# Patient Record
Sex: Female | Born: 1945 | Race: Black or African American | Hispanic: No | Smoking: Current some day smoker
Health system: Southern US, Community
[De-identification: ages and names within clinical notes are randomized; demographics above are authoritative.]

## PROBLEM LIST (undated history)

## (undated) DIAGNOSIS — E079 Disorder of thyroid, unspecified: Secondary | ICD-10-CM

## (undated) DIAGNOSIS — R519 Headache, unspecified: Secondary | ICD-10-CM

## (undated) DIAGNOSIS — N200 Calculus of kidney: Secondary | ICD-10-CM

## (undated) DIAGNOSIS — K635 Polyp of colon: Secondary | ICD-10-CM

## (undated) DIAGNOSIS — F32A Depression, unspecified: Secondary | ICD-10-CM

## (undated) DIAGNOSIS — E785 Hyperlipidemia, unspecified: Secondary | ICD-10-CM

## (undated) DIAGNOSIS — F329 Major depressive disorder, single episode, unspecified: Secondary | ICD-10-CM

## (undated) DIAGNOSIS — K589 Irritable bowel syndrome without diarrhea: Secondary | ICD-10-CM

## (undated) DIAGNOSIS — C801 Malignant (primary) neoplasm, unspecified: Secondary | ICD-10-CM

## (undated) DIAGNOSIS — D649 Anemia, unspecified: Secondary | ICD-10-CM

## (undated) DIAGNOSIS — J189 Pneumonia, unspecified organism: Secondary | ICD-10-CM

## (undated) DIAGNOSIS — I10 Essential (primary) hypertension: Secondary | ICD-10-CM

## (undated) DIAGNOSIS — Z87442 Personal history of urinary calculi: Secondary | ICD-10-CM

## (undated) DIAGNOSIS — M1990 Unspecified osteoarthritis, unspecified site: Secondary | ICD-10-CM

## (undated) DIAGNOSIS — E039 Hypothyroidism, unspecified: Secondary | ICD-10-CM

## (undated) HISTORY — DX: Calculus of kidney: N20.0

## (undated) HISTORY — PX: FOOT SURGERY: SHX648

## (undated) HISTORY — DX: Anemia, unspecified: D64.9

## (undated) HISTORY — PX: BREAST SURGERY: SHX581

## (undated) HISTORY — DX: Polyp of colon: K63.5

## (undated) HISTORY — DX: Hyperlipidemia, unspecified: E78.5

## (undated) HISTORY — DX: Disorder of thyroid, unspecified: E07.9

## (undated) HISTORY — DX: Depression, unspecified: F32.A

## (undated) HISTORY — PX: KIDNEY STONE SURGERY: SHX686

## (undated) HISTORY — PX: THYROID SURGERY: SHX805

## (undated) HISTORY — PX: ABDOMINAL HYSTERECTOMY: SHX81

## (undated) HISTORY — PX: PORT-A-CATH REMOVAL: SHX5289

## (undated) HISTORY — DX: Major depressive disorder, single episode, unspecified: F32.9

## (undated) HISTORY — PX: TONSILLECTOMY: SUR1361

## (undated) HISTORY — PX: BREAST ENHANCEMENT SURGERY: SHX7

## (undated) HISTORY — DX: Irritable bowel syndrome, unspecified: K58.9

## (undated) HISTORY — PX: EYE SURGERY: SHX253

## (undated) HISTORY — PX: NASAL SINUS SURGERY: SHX719

---

## 1998-03-24 HISTORY — PX: CHOLECYSTECTOMY: SHX55

## 2003-08-03 ENCOUNTER — Other Ambulatory Visit: Payer: Self-pay

## 2004-03-29 ENCOUNTER — Emergency Department: Payer: Self-pay | Admitting: Emergency Medicine

## 2004-10-16 ENCOUNTER — Emergency Department: Payer: Self-pay | Admitting: Emergency Medicine

## 2004-10-16 ENCOUNTER — Other Ambulatory Visit: Payer: Self-pay

## 2006-12-25 ENCOUNTER — Emergency Department: Payer: Self-pay

## 2006-12-25 ENCOUNTER — Other Ambulatory Visit: Payer: Self-pay

## 2007-07-13 ENCOUNTER — Other Ambulatory Visit: Payer: Self-pay

## 2007-07-13 ENCOUNTER — Emergency Department: Payer: Self-pay | Admitting: Emergency Medicine

## 2011-04-04 ENCOUNTER — Emergency Department: Payer: Self-pay | Admitting: Emergency Medicine

## 2011-11-28 ENCOUNTER — Ambulatory Visit: Payer: Self-pay | Admitting: Otolaryngology

## 2011-12-04 ENCOUNTER — Ambulatory Visit: Payer: Self-pay | Admitting: Otolaryngology

## 2011-12-04 LAB — CREATININE, SERUM
Creatinine: 0.9 mg/dL (ref 0.60–1.30)
EGFR (African American): >60
EGFR (Non-African Amer.): >60

## 2011-12-17 ENCOUNTER — Ambulatory Visit: Payer: Self-pay | Admitting: Gastroenterology

## 2011-12-19 LAB — PATHOLOGY REPORT

## 2012-01-27 ENCOUNTER — Ambulatory Visit: Payer: Self-pay | Admitting: Otolaryngology

## 2012-02-09 ENCOUNTER — Ambulatory Visit: Payer: Self-pay | Admitting: Otolaryngology

## 2012-02-09 LAB — ALBUMIN: Albumin: 3.7 g/dL (ref 3.4–5.0)

## 2012-02-09 LAB — CALCIUM
Calcium, Total: 8.6 mg/dL (ref 8.5–10.1)
Calcium, Total: 8.8 mg/dL (ref 8.5–10.1)

## 2012-02-10 LAB — PATHOLOGY REPORT

## 2012-02-10 LAB — CALCIUM: Calcium, Total: 9 mg/dL (ref 8.5–10.1)

## 2013-03-10 ENCOUNTER — Emergency Department: Payer: Self-pay | Admitting: Emergency Medicine

## 2013-03-10 LAB — CBC
HCT: 30.8 % — ABNORMAL LOW (ref 35.0–47.0)
HGB: 10 g/dL — ABNORMAL LOW (ref 12.0–16.0)
MCH: 30.1 pg (ref 26.0–34.0)
MCHC: 32.5 g/dL (ref 32.0–36.0)
MCV: 93 fL (ref 80–100)
Platelet: 256 10*3/uL (ref 150–440)
RBC: 3.33 10*6/uL — ABNORMAL LOW (ref 3.80–5.20)
RDW: 13.4 % (ref 11.5–14.5)
WBC: 5.3 10*3/uL (ref 3.6–11.0)

## 2013-03-10 LAB — COMPREHENSIVE METABOLIC PANEL
Albumin: 3.8 g/dL (ref 3.4–5.0)
Alkaline Phosphatase: 297 U/L — ABNORMAL HIGH
Anion Gap: 5 — ABNORMAL LOW (ref 7–16)
BUN: 10 mg/dL (ref 7–18)
Bilirubin,Total: 0.6 mg/dL (ref 0.2–1.0)
Calcium, Total: 8.7 mg/dL (ref 8.5–10.1)
Chloride: 106 mmol/L (ref 98–107)
Co2: 28 mmol/L (ref 21–32)
Creatinine: 0.68 mg/dL (ref 0.60–1.30)
EGFR (African American): >60
EGFR (Non-African Amer.): >60
Glucose: 109 mg/dL — ABNORMAL HIGH (ref 65–99)
Osmolality: 277 (ref 275–301)
Potassium: 3.4 mmol/L — ABNORMAL LOW (ref 3.5–5.1)
SGOT(AST): 997 U/L — ABNORMAL HIGH (ref 15–37)
SGPT (ALT): 444 U/L — ABNORMAL HIGH (ref 12–78)
Sodium: 139 mmol/L (ref 136–145)
Total Protein: 8.2 g/dL (ref 6.4–8.2)

## 2013-03-10 LAB — LIPASE, BLOOD: Lipase: 137 U/L (ref 73–393)

## 2013-03-10 LAB — TROPONIN I: Troponin-I: <0.02 ng/mL

## 2013-03-11 LAB — ACETAMINOPHEN LEVEL: Acetaminophen: <2 ug/mL

## 2013-04-26 LAB — HM HEPATITIS C SCREENING LAB: HM Hepatitis Screen: NEGATIVE

## 2013-09-09 LAB — BASIC METABOLIC PANEL
Anion Gap: 7 (ref 7–16)
BUN: 16 mg/dL (ref 7–18)
Calcium, Total: 8.7 mg/dL (ref 8.5–10.1)
Chloride: 109 mmol/L — ABNORMAL HIGH (ref 98–107)
Co2: 27 mmol/L (ref 21–32)
Creatinine: 0.85 mg/dL (ref 0.60–1.30)
EGFR (African American): >60
EGFR (Non-African Amer.): >60
Glucose: 96 mg/dL (ref 65–99)
Osmolality: 286 (ref 275–301)
Potassium: 3.8 mmol/L (ref 3.5–5.1)
Sodium: 143 mmol/L (ref 136–145)

## 2013-09-09 LAB — CBC WITH DIFFERENTIAL/PLATELET
Basophil #: 0.1 10*3/uL (ref 0.0–0.1)
Basophil %: 1.4 %
Eosinophil #: 0.1 10*3/uL (ref 0.0–0.7)
Eosinophil %: 1.3 %
HCT: 18.8 % — ABNORMAL LOW (ref 35.0–47.0)
HGB: 5.6 g/dL — ABNORMAL LOW (ref 12.0–16.0)
Lymphocyte #: 1.2 10*3/uL (ref 1.0–3.6)
Lymphocyte %: 30.8 %
MCH: 20.7 pg — ABNORMAL LOW (ref 26.0–34.0)
MCHC: 29.5 g/dL — ABNORMAL LOW (ref 32.0–36.0)
MCV: 70 fL — ABNORMAL LOW (ref 80–100)
Monocyte #: 0.4 x10 3/mm (ref 0.2–0.9)
Monocyte %: 8.9 %
Neutrophil #: 2.3 10*3/uL (ref 1.4–6.5)
Neutrophil %: 57.6 %
Platelet: 434 10*3/uL (ref 150–440)
RBC: 2.69 10*6/uL — ABNORMAL LOW (ref 3.80–5.20)
RDW: 18.2 % — ABNORMAL HIGH (ref 11.5–14.5)
WBC: 4 10*3/uL (ref 3.6–11.0)

## 2013-09-09 LAB — TROPONIN I: Troponin-I: <0.02 ng/mL

## 2013-09-10 ENCOUNTER — Inpatient Hospital Stay: Payer: Self-pay | Admitting: Internal Medicine

## 2013-09-10 LAB — URINALYSIS, COMPLETE
Bacteria: NONE SEEN
Bilirubin,UR: NEGATIVE
Blood: NEGATIVE
Glucose,UR: NEGATIVE mg/dL (ref 0–75)
Ketone: NEGATIVE
Leukocyte Esterase: NEGATIVE
Nitrite: NEGATIVE
Ph: 5 (ref 4.5–8.0)
Protein: NEGATIVE
RBC,UR: <1 /HPF (ref 0–5)
Specific Gravity: 1.018 (ref 1.003–1.030)
Squamous Epithelial: <1
WBC UR: 1 /HPF (ref 0–5)

## 2013-09-10 LAB — IRON AND TIBC
Iron Bind.Cap.(Total): 489 ug/dL — ABNORMAL HIGH (ref 250–450)
Iron Saturation: 3 %
Iron: 15 ug/dL — ABNORMAL LOW (ref 50–170)
Unbound Iron-Bind.Cap.: 474 ug/dL

## 2013-09-10 LAB — FERRITIN: Ferritin (ARMC): 3 ng/mL — ABNORMAL LOW (ref 8–388)

## 2013-09-10 LAB — RETICULOCYTES
Absolute Retic Count: 0.0451 10*6/uL (ref 0.019–0.186)
Reticulocyte: 1.69 % (ref 0.4–3.1)

## 2013-09-11 LAB — CBC WITH DIFFERENTIAL/PLATELET
Basophil #: 0 10*3/uL (ref 0.0–0.1)
Basophil %: 1.4 %
Eosinophil #: 0 10*3/uL (ref 0.0–0.7)
Eosinophil %: 1.2 %
HCT: 23.3 % — ABNORMAL LOW (ref 35.0–47.0)
HGB: 7.1 g/dL — ABNORMAL LOW (ref 12.0–16.0)
Lymphocyte #: 1.1 10*3/uL (ref 1.0–3.6)
Lymphocyte %: 30.4 %
MCH: 22.1 pg — ABNORMAL LOW (ref 26.0–34.0)
MCHC: 30.2 g/dL — ABNORMAL LOW (ref 32.0–36.0)
MCV: 73 fL — ABNORMAL LOW (ref 80–100)
Monocyte #: 0.4 x10 3/mm (ref 0.2–0.9)
Monocyte %: 12.6 %
Neutrophil #: 1.9 10*3/uL (ref 1.4–6.5)
Neutrophil %: 54.4 %
Platelet: 357 10*3/uL (ref 150–440)
RBC: 3.19 10*6/uL — ABNORMAL LOW (ref 3.80–5.20)
RDW: 20.4 % — ABNORMAL HIGH (ref 11.5–14.5)
WBC: 3.5 10*3/uL — ABNORMAL LOW (ref 3.6–11.0)

## 2013-09-11 LAB — TSH: Thyroid Stimulating Horm: 0.644 u[IU]/mL

## 2013-09-12 LAB — HEMOGLOBIN: HGB: 7 g/dL — ABNORMAL LOW (ref 12.0–16.0)

## 2013-09-13 HISTORY — PX: COLONOSCOPY: SHX174

## 2013-09-13 LAB — CBC WITH DIFFERENTIAL/PLATELET
Basophil #: 0 10*3/uL (ref 0.0–0.1)
Basophil %: 1 %
Eosinophil #: 0 10*3/uL (ref 0.0–0.7)
Eosinophil %: 1 %
HCT: 25.7 % — ABNORMAL LOW (ref 35.0–47.0)
HGB: 8.2 g/dL — ABNORMAL LOW (ref 12.0–16.0)
Lymphocyte #: 1.2 10*3/uL (ref 1.0–3.6)
Lymphocyte %: 28.9 %
MCH: 23.8 pg — ABNORMAL LOW (ref 26.0–34.0)
MCHC: 31.9 g/dL — ABNORMAL LOW (ref 32.0–36.0)
MCV: 75 fL — ABNORMAL LOW (ref 80–100)
Monocyte #: 0.5 x10 3/mm (ref 0.2–0.9)
Monocyte %: 11.9 %
Neutrophil #: 2.4 10*3/uL (ref 1.4–6.5)
Neutrophil %: 57.2 %
Platelet: 335 10*3/uL (ref 150–440)
RBC: 3.44 10*6/uL — ABNORMAL LOW (ref 3.80–5.20)
RDW: 20.7 % — ABNORMAL HIGH (ref 11.5–14.5)
WBC: 4.2 10*3/uL (ref 3.6–11.0)

## 2013-09-13 LAB — PATHOLOGY REPORT

## 2013-09-14 LAB — HEMOGLOBIN: HGB: 8.1 g/dL — ABNORMAL LOW (ref 12.0–16.0)

## 2013-09-15 LAB — PATHOLOGY REPORT

## 2013-09-21 DIAGNOSIS — C189 Malignant neoplasm of colon, unspecified: Secondary | ICD-10-CM | POA: Insufficient documentation

## 2013-09-21 HISTORY — PX: COLECTOMY: SHX59

## 2013-09-22 ENCOUNTER — Encounter: Payer: Self-pay | Admitting: General Surgery

## 2013-09-22 ENCOUNTER — Ambulatory Visit (INDEPENDENT_AMBULATORY_CARE_PROVIDER_SITE_OTHER): Payer: Commercial Managed Care - HMO | Admitting: General Surgery

## 2013-09-22 ENCOUNTER — Ambulatory Visit: Payer: Self-pay | Admitting: General Surgery

## 2013-09-22 VITALS — BP 138/86 | HR 62 | Resp 12 | Ht 66.0 in | Wt 170.0 lb

## 2013-09-22 DIAGNOSIS — C189 Malignant neoplasm of colon, unspecified: Secondary | ICD-10-CM

## 2013-09-22 MED ORDER — POLYETHYLENE GLYCOL 3350 17 GM/SCOOP PO POWD: ORAL | Status: DC

## 2013-09-22 MED ORDER — NEOMYCIN SULFATE 500 MG PO TABS: ORAL_TABLET | ORAL | Status: DC

## 2013-09-22 MED ORDER — METRONIDAZOLE 500 MG PO TABS: ORAL_TABLET | ORAL | Status: DC

## 2013-09-22 NOTE — Progress Notes (Signed)
Patient ID: ANANI GU, female   DOB: 09-10-1945, 68 y.o.   MRN: [id number]  Chief Complaint  Patient presents with  . Other    evaluate for colon cancer    HPI INA SCRIVENS is a 68 y.o. female.  Here today for evaluation of colon cancer. Dr. [name] completed a colonoscopy on 09-13-13. Bowels move about 2 times a week. Nausea associated with occasional vomiting. She states they told her it was colon cancer yesterday.  Last week she was admitted for low hemoglobin and received blood transfusions. Upper endoscopy also done-normal.     HPI  Past Medical History  Diagnosis Date  . Irritable bowel disease   . Colon polyp   . Anemia   . Thyroid disease   . Hyperlipidemia     Past Surgical History  Procedure Laterality Date  . Colonoscopy  09-13-13    Dr [name]  . Tonsillectomy    . Kidney stone surgery    . Thyroid surgery    . Foot surgery    . Nasal sinus surgery    . Abdominal hysterectomy    . Cholecystectomy  2000    Family History  Problem Relation Age of Onset  . Colon cancer      breast cancer/first cousin    Social History History  Substance Use Topics  . Smoking status: Current Some Day Smoker -- 0.25 packs/day    Types: Cigarettes  . Smokeless tobacco: Not on file  . Alcohol Use: Yes     Comment: occasionally    Allergies  Allergen Reactions  . Codeine Nausea And Vomiting  . Oxycodone Nausea And Vomiting  . Penicillins Nausea And Vomiting    Current Outpatient Prescriptions  Medication Sig Dispense Refill  . docusate sodium (COLACE) 50 MG capsule Take 50 mg by mouth daily as needed for mild constipation.      Marland Kitchen esomeprazole (NEXIUM) 40 MG capsule Take 40 mg by mouth daily at 12 noon.      . ferrous fumarate (HEMOCYTE - 106 MG FE) 325 (106 FE) MG TABS tablet Take 1 tablet by mouth daily.      Marland Kitchen levothyroxine (SYNTHROID, LEVOTHROID) 100 MCG tablet Take 100 mcg by mouth daily before breakfast.      . metoprolol succinate (TOPROL-XL) 25 MG 24  hr tablet Take 25 mg by mouth daily.      Marland Kitchen zolpidem (AMBIEN) 5 MG tablet Take 5 mg by mouth at bedtime as needed for sleep.       No current facility-administered medications for this visit.    Review of Systems Review of Systems  Constitutional: Negative.   Respiratory: Negative.   Cardiovascular: Negative.   Gastrointestinal: Positive for nausea, vomiting and constipation.  Neurological: Positive for light-headedness.    Blood pressure 138/86, pulse 62, resp. rate 12, height 5\' 6"  (1.676 m), weight 170 lb (77.111 kg).  Physical Exam Physical Exam  Constitutional: She is oriented to person, place, and time. She appears well-developed and well-nourished.  Eyes: No scleral icterus.  Mild pallor  Neck: Neck supple.  Cardiovascular: Normal rate, regular rhythm and normal heart sounds.   Pulses:      Dorsalis pedis pulses are 2+ on the right side, and 2+ on the left side.       Posterior tibial pulses are 2+ on the right side, and 2+ on the left side.  No edema  Pulmonary/Chest: Effort normal and breath sounds normal.  Abdominal: Soft. Normal appearance and bowel sounds  are normal. There is no hepatosplenomegaly. No hernia.  Lymphadenopathy:    She has no cervical adenopathy.  Neurological: She is alert and oriented to person, place, and time.  Skin: Skin is warm and dry.    Data Reviewed Colonoscopy, recent labs reviewed. 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  Assessment    CA of the cecum with bleeding. Last HGB 8.9 2 days ago. She did receive 2 units of blood last week. CEA has not been done. Liver functions are normal.     Plan    Get CEA done if normal proceed with Laparoscopic right hemicolectomy. Procedure risk and benefits explained. If CEA is high will need CT scan prior to surgery. Pt agreeable with the plan    Patient to have the following labs drawn at [facility] lab: CEA. This patient is aware of instructions and verbalizes understanding.    This patient's surgery has been scheduled for 09-28-13 at [facility].    PCP: Jackey Loge 09/22/2013, 10:18 AM

## 2013-09-22 NOTE — Patient Instructions (Addendum)
The patient is aware to call back for any questions or concerns.  Patient to have the following labs drawn at [facility] lab: CEA.Laparoscopic Colectomy Laparoscopic colectomy is surgery to remove part or all of the large intestine (colon). This procedure is used to treat several conditions, including:  Inflammation and infection of the colon (diverticulitis).  Tumors or masses in the colon.  Inflammatory bowel disease, such as Crohn disease or ulcerative colitis. Colectomy is an option when symptoms cannot be controlled with medicines.  Bleeding from the colon that cannot be controlled by another method.  Blockage or obstruction of the colon. LET [facility] CARE PROVIDER KNOW ABOUT:  Any allergies you have.  All medicines you are taking, including vitamins, herbs, eye drops, creams, and over-the-counter medicines.  Previous problems you or members of your family have had with the use of anesthetics.  Any blood disorders you have.  Previous surgeries you have had.  Medical conditions you have. RISKS AND COMPLICATIONS Generally, this is a safe procedure. However, as with any procedure, complications can occur. Possible complications include:  Infection.  Bleeding.  Damage to other organs.  Leaking from where the colon was sewn together.  Future blockage of the small intestines from scar tissue. Another surgery may be needed to repair this. In some cases, complications such as damage to other organs or excessive bleeding may require the surgeon to convert from a laparoscopic procedure to an open procedure. This involves making a larger incision in the abdomen to perform the procedure. BEFORE THE PROCEDURE  Ask your health care provider about changing or stopping any regular medicines.  You may be prescribed an oral bowel prep. This involves drinking a large amount of medicated liquid, starting the day before your surgery. The liquid will cause you to have multiple loose  stools until your stool is almost clear or light green. This cleans out your colon in preparation for the surgery.  Do not eat or drink anything else once you have started the bowel prep, unless your health care provider tells you it is safe to do so.  You may also be given antibiotic pills to clean out your colon of bacteria. Be sure to follow the directions carefully and take the medicine at the correct time. PROCEDURE   Small monitors will be put on your body. They are used to check your heart, blood pressure, and oxygen level.  An IV access tube will be put into one of your veins. Medicine will be able to flow directly into your body through this IV tube.  You might be given a medicine to help you relax (sedative).  You will be given a medicine to make you sleep through the procedure (general anesthetic). A breathing tube may be placed into your lungs during the procedure.  A thin, flexible tube (catheter) will be placed into your bladder to collect urine.  A tube may be put in through your nose. It is called a nasogastric tube. It is used to remove stomach fluids after surgery until the intestines start working again.  Your abdomen will be filled with air so that it expands. This gives the surgeon more room to operate and makes your organs easier to see.  Several small cuts (incisions) are made in your abdomen.  A thin, lighted tube with a tiny camera on the end (laparoscope) is put through one of the small incisions. The camera on the laparoscope sends a picture to a TV screen in the operating room. This gives  the surgeon a good view inside your abdomen.  Hollow tubes are put through the other small incisions in your abdomen. The tools needed for the procedure are put through these tubes.  Clamps or staples are put on both ends of the diseased part of the colon.  The part of the intestine between the clamps or staples is removed.  If possible, the ends of the healthy colon that  remain will be stitched or stapled together to allow your body to expel waste (stool).  Sometimes, the remaining colon cannot be stitched back together. If this is the case, a colostomy is needed. For a colostomy:  An opening (stoma) to the outside of your body is made through the abdomen.  The end of the colon is brought to the opening. It is stitched to the skin.  A bag is attached to the opening. Stool will drain into this bag. The bag is removable.  The colostomy can be temporary or permanent.  The incisions from the colectomy are closed with stitches or staples. AFTER THE PROCEDURE  You will be monitored closely in a recovery area until you are stable and doing well. You will then be moved to a regular hospital room.  You will need to receive fluids through an IV tube until your bowel function has returned. This may take 1-3 days. Once your bowels are working again, you will be started on clear liquids and then advanced to solid food as tolerated.  You will be given pain medicines to control your pain. Document Released: 05/31/2002 Document Revised: 12/29/2012 Document Reviewed: 10/20/2012 [facility] Patient Information 2015 [place], [place]. This information is not intended to replace advice given to you by your health care provider. Make sure you discuss any questions you have with your health care provider.  This patient's surgery has been scheduled for 09-28-13 at [facility].

## 2013-09-23 LAB — CEA: CEA: 14 ng/mL — ABNORMAL HIGH (ref 0.0–4.7)

## 2013-09-26 ENCOUNTER — Telehealth: Payer: Self-pay | Admitting: *Deleted

## 2013-09-26 ENCOUNTER — Ambulatory Visit: Payer: Self-pay | Admitting: General Surgery

## 2013-09-26 ENCOUNTER — Encounter: Payer: Self-pay | Admitting: General Surgery

## 2013-09-26 NOTE — Telephone Encounter (Signed)
Patient has been scheduled for a CT abdomen/pelvis with contrast at [facility] for today, 09-26-13 at 2 pm (arrive 1:30 pm). Prep: no solids 4 hours prior but patient may have clear liquids up until exam time, pick up prep kit, and take medication list. Patient verbalizes understanding.

## 2013-09-28 ENCOUNTER — Inpatient Hospital Stay: Payer: Self-pay | Admitting: General Surgery

## 2013-09-28 DIAGNOSIS — C180 Malignant neoplasm of cecum: Secondary | ICD-10-CM

## 2013-09-28 DIAGNOSIS — K660 Peritoneal adhesions (postprocedural) (postinfection): Secondary | ICD-10-CM

## 2013-09-29 ENCOUNTER — Encounter: Payer: Self-pay | Admitting: General Surgery

## 2013-09-29 LAB — BASIC METABOLIC PANEL
Anion Gap: 6 — ABNORMAL LOW (ref 7–16)
BUN: 8 mg/dL (ref 7–18)
Calcium, Total: 7.8 mg/dL — ABNORMAL LOW (ref 8.5–10.1)
Chloride: 104 mmol/L (ref 98–107)
Co2: 30 mmol/L (ref 21–32)
Creatinine: 0.81 mg/dL (ref 0.60–1.30)
EGFR (African American): >60
EGFR (Non-African Amer.): >60
Glucose: 135 mg/dL — ABNORMAL HIGH (ref 65–99)
Osmolality: 280 (ref 275–301)
Potassium: 3.8 mmol/L (ref 3.5–5.1)
Sodium: 140 mmol/L (ref 136–145)

## 2013-09-29 LAB — CBC WITH DIFFERENTIAL/PLATELET
Basophil #: 0 10*3/uL (ref 0.0–0.1)
Basophil %: 0.1 %
Eosinophil #: 0 10*3/uL (ref 0.0–0.7)
Eosinophil %: 0 %
HCT: 21.5 % — ABNORMAL LOW (ref 35.0–47.0)
HGB: 6.9 g/dL — ABNORMAL LOW (ref 12.0–16.0)
Lymphocyte #: 0.9 10*3/uL — ABNORMAL LOW (ref 1.0–3.6)
Lymphocyte %: 8.2 %
MCH: 26.3 pg (ref 26.0–34.0)
MCHC: 32 g/dL (ref 32.0–36.0)
MCV: 82 fL (ref 80–100)
Monocyte #: 0.6 x10 3/mm (ref 0.2–0.9)
Monocyte %: 5.3 %
Neutrophil #: 9 10*3/uL — ABNORMAL HIGH (ref 1.4–6.5)
Neutrophil %: 86.4 %
Platelet: 286 10*3/uL (ref 150–440)
RBC: 2.62 10*6/uL — ABNORMAL LOW (ref 3.80–5.20)
RDW: 24.4 % — ABNORMAL HIGH (ref 11.5–14.5)
WBC: 10.5 10*3/uL (ref 3.6–11.0)

## 2013-09-30 LAB — CBC WITH DIFFERENTIAL/PLATELET
Basophil #: 0 10*3/uL (ref 0.0–0.1)
Basophil %: 0.2 %
Eosinophil #: 0 10*3/uL (ref 0.0–0.7)
Eosinophil %: 0.1 %
HCT: 22 % — ABNORMAL LOW (ref 35.0–47.0)
HGB: 7.2 g/dL — ABNORMAL LOW (ref 12.0–16.0)
Lymphocyte #: 0.6 10*3/uL — ABNORMAL LOW (ref 1.0–3.6)
Lymphocyte %: 6 %
MCH: 26.9 pg (ref 26.0–34.0)
MCHC: 32.7 g/dL (ref 32.0–36.0)
MCV: 82 fL (ref 80–100)
Monocyte #: 0.7 x10 3/mm (ref 0.2–0.9)
Monocyte %: 6.2 %
Neutrophil #: 9.2 10*3/uL — ABNORMAL HIGH (ref 1.4–6.5)
Neutrophil %: 87.5 %
Platelet: 256 10*3/uL (ref 150–440)
RBC: 2.68 10*6/uL — ABNORMAL LOW (ref 3.80–5.20)
RDW: 22.7 % — ABNORMAL HIGH (ref 11.5–14.5)
WBC: 10.5 10*3/uL (ref 3.6–11.0)

## 2013-09-30 LAB — PATHOLOGY REPORT

## 2013-10-01 LAB — BASIC METABOLIC PANEL
Anion Gap: 5 — ABNORMAL LOW (ref 7–16)
BUN: 6 mg/dL — ABNORMAL LOW (ref 7–18)
Calcium, Total: 8 mg/dL — ABNORMAL LOW (ref 8.5–10.1)
Chloride: 104 mmol/L (ref 98–107)
Co2: 30 mmol/L (ref 21–32)
Creatinine: 0.75 mg/dL (ref 0.60–1.30)
EGFR (African American): >60
EGFR (Non-African Amer.): >60
Glucose: 101 mg/dL — ABNORMAL HIGH (ref 65–99)
Osmolality: 275 (ref 275–301)
Potassium: 3.2 mmol/L — ABNORMAL LOW (ref 3.5–5.1)
Sodium: 139 mmol/L (ref 136–145)

## 2013-10-01 LAB — CBC WITH DIFFERENTIAL/PLATELET
Basophil #: 0 10*3/uL (ref 0.0–0.1)
Basophil %: 0.3 %
Eosinophil #: 0 10*3/uL (ref 0.0–0.7)
Eosinophil %: 0 %
HCT: 19.7 % — ABNORMAL LOW (ref 35.0–47.0)
HGB: 6.3 g/dL — ABNORMAL LOW (ref 12.0–16.0)
Lymphocyte #: 1 10*3/uL (ref 1.0–3.6)
Lymphocyte %: 10 %
MCH: 26.1 pg (ref 26.0–34.0)
MCHC: 32.1 g/dL (ref 32.0–36.0)
MCV: 81 fL (ref 80–100)
Monocyte #: 0.7 x10 3/mm (ref 0.2–0.9)
Monocyte %: 7.3 %
Neutrophil #: 8.3 10*3/uL — ABNORMAL HIGH (ref 1.4–6.5)
Neutrophil %: 82.4 %
Platelet: 257 10*3/uL (ref 150–440)
RBC: 2.42 10*6/uL — ABNORMAL LOW (ref 3.80–5.20)
RDW: 23.2 % — ABNORMAL HIGH (ref 11.5–14.5)
WBC: 10.1 10*3/uL (ref 3.6–11.0)

## 2013-10-02 LAB — HEMOGLOBIN: HGB: 6.8 g/dL — ABNORMAL LOW (ref 12.0–16.0)

## 2013-10-07 ENCOUNTER — Other Ambulatory Visit: Payer: Self-pay | Admitting: General Surgery

## 2013-10-07 ENCOUNTER — Ambulatory Visit (INDEPENDENT_AMBULATORY_CARE_PROVIDER_SITE_OTHER): Payer: Self-pay | Admitting: *Deleted

## 2013-10-07 DIAGNOSIS — C189 Malignant neoplasm of colon, unspecified: Secondary | ICD-10-CM

## 2013-10-07 NOTE — Patient Instructions (Signed)
Patient aware of appointment on 10/17/13 @ 10:45 am for follow up. The patient is aware to call back for any questions or concerns.

## 2013-10-07 NOTE — Progress Notes (Signed)
Patient came in today for a wound check.  The wound is clean, with no signs of infection noted. The staples were removed and steri strips applied. The patient is to follow up 10/17/13 with Dr. Jamal Collin. Patient complains of pain medication not working. She states she is currently taking Oxycodone. Note left on Dr. Dwyane Luo desk for advise. Patient's pharmacy is [facility].

## 2013-10-07 NOTE — Telephone Encounter (Signed)
Patient still with significant abdominal pain s/p right colectomy on September 28, 2013. Tolerating diet well, reports good bowel movements. Using heat as encouraged at discharge. RX for Norco 7.5/ 325, #30 NR for patient pick up.

## 2013-10-11 ENCOUNTER — Ambulatory Visit: Payer: Commercial Managed Care - HMO | Admitting: General Surgery

## 2013-10-17 ENCOUNTER — Ambulatory Visit: Payer: Self-pay | Admitting: Oncology

## 2013-10-17 ENCOUNTER — Ambulatory Visit (INDEPENDENT_AMBULATORY_CARE_PROVIDER_SITE_OTHER): Payer: Self-pay | Admitting: General Surgery

## 2013-10-17 ENCOUNTER — Other Ambulatory Visit: Payer: Self-pay | Admitting: General Surgery

## 2013-10-17 ENCOUNTER — Encounter: Payer: Self-pay | Admitting: General Surgery

## 2013-10-17 ENCOUNTER — Telehealth: Payer: Self-pay | Admitting: *Deleted

## 2013-10-17 VITALS — BP 130/66 | HR 70 | Resp 14 | Ht 66.0 in | Wt 161.0 lb

## 2013-10-17 DIAGNOSIS — C180 Malignant neoplasm of cecum: Secondary | ICD-10-CM

## 2013-10-17 NOTE — Progress Notes (Signed)
This is a 68 year old female here today for her post op laparoscopy lysis of adhesions and right colectomy done on 09/28/13. Incsion looks clean and well healed. Lung are clear and abdomen is soft. Conj mild pallor. Last Hgb was close to 7. Overall doing well. Path-T3,N1-single node involved. Moderately differentiated adenoca. Pt aware of need for chemo. Will arrange oncology consult.  Also discussed port placement. Pt agreeable. Repeat labs today-CBC,CEA, Met C. Preop CEA was 14  Patient's surgery has been scheduled for 10-24-13 at [facility].

## 2013-10-17 NOTE — Telephone Encounter (Signed)
Patient has been scheduled for an appointment to see Dr. Oliva Bustard at the [facility] for Friday, 10-21-2013 at 11 am (arrive 10:45 am). This patient is aware of date, time, and instructions.   Records will be forwarded to [facility] at Dr. Metro Kung office (Fax: [phone]).

## 2013-10-17 NOTE — Patient Instructions (Addendum)
Follow up appointment to be announced.  Patient's surgery has been scheduled for 10-24-13 at [facility].

## 2013-10-18 LAB — CBC WITH DIFFERENTIAL
Basophils Absolute: 0 10*3/uL (ref 0.0–0.2)
Basos: 1 %
Eos: 2 %
Eosinophils Absolute: 0.1 10*3/uL (ref 0.0–0.4)
HCT: 28.4 % — ABNORMAL LOW (ref 34.0–46.6)
Hemoglobin: 8.7 g/dL — ABNORMAL LOW (ref 11.1–15.9)
Immature Grans (Abs): 0 10*3/uL (ref 0.0–0.1)
Immature Granulocytes: 0 %
Lymphocytes Absolute: 1 10*3/uL (ref 0.7–3.1)
Lymphs: 29 %
MCH: 25.8 pg — ABNORMAL LOW (ref 26.6–33.0)
MCHC: 30.6 g/dL — ABNORMAL LOW (ref 31.5–35.7)
MCV: 84 fL (ref 79–97)
Monocytes Absolute: 0.3 10*3/uL (ref 0.1–0.9)
Monocytes: 7 %
Neutrophils Absolute: 2.1 10*3/uL (ref 1.4–7.0)
Neutrophils Relative %: 61 %
Platelets: 576 10*3/uL — ABNORMAL HIGH (ref 150–379)
RBC: 3.37 x10E6/uL — ABNORMAL LOW (ref 3.77–5.28)
RDW: 22 % — ABNORMAL HIGH (ref 12.3–15.4)
WBC: 3.5 10*3/uL (ref 3.4–10.8)

## 2013-10-18 LAB — COMPREHENSIVE METABOLIC PANEL
ALT: 14 IU/L (ref 0–32)
AST: 18 IU/L (ref 0–40)
Albumin/Globulin Ratio: 1 — ABNORMAL LOW (ref 1.1–2.5)
Albumin: 3.9 g/dL (ref 3.6–4.8)
Alkaline Phosphatase: 95 IU/L (ref 39–117)
BUN/Creatinine Ratio: 19 (ref 11–26)
BUN: 13 mg/dL (ref 8–27)
CO2: 24 mmol/L (ref 18–29)
Calcium: 9.8 mg/dL (ref 8.7–10.3)
Chloride: 102 mmol/L (ref 97–108)
Creatinine, Ser: 0.68 mg/dL (ref 0.57–1.00)
GFR calc Af Amer: 105 mL/min/{1.73_m2} (ref >59)
GFR calc non Af Amer: 91 mL/min/{1.73_m2} (ref >59)
Globulin, Total: 3.9 g/dL (ref 1.5–4.5)
Glucose: 88 mg/dL (ref 65–99)
Potassium: 4 mmol/L (ref 3.5–5.2)
Sodium: 140 mmol/L (ref 134–144)
Total Bilirubin: 0.3 mg/dL (ref 0.0–1.2)
Total Protein: 7.8 g/dL (ref 6.0–8.5)

## 2013-10-21 ENCOUNTER — Ambulatory Visit: Payer: Self-pay | Admitting: Oncology

## 2013-10-21 LAB — CEA: CEA: 2.9 ng/mL (ref 0.0–4.7)

## 2013-10-21 LAB — SPECIMEN STATUS REPORT

## 2013-10-22 ENCOUNTER — Ambulatory Visit: Payer: Self-pay | Admitting: Oncology

## 2013-10-24 ENCOUNTER — Ambulatory Visit: Payer: Self-pay | Admitting: General Surgery

## 2013-10-24 ENCOUNTER — Encounter: Payer: Self-pay | Admitting: General Surgery

## 2013-10-24 DIAGNOSIS — C180 Malignant neoplasm of cecum: Secondary | ICD-10-CM

## 2013-10-24 HISTORY — PX: PORTACATH PLACEMENT: SHX2246

## 2013-10-25 ENCOUNTER — Encounter: Payer: Self-pay | Admitting: General Surgery

## 2013-11-01 ENCOUNTER — Ambulatory Visit (INDEPENDENT_AMBULATORY_CARE_PROVIDER_SITE_OTHER): Payer: Self-pay | Admitting: General Surgery

## 2013-11-01 ENCOUNTER — Encounter: Payer: Self-pay | Admitting: General Surgery

## 2013-11-01 ENCOUNTER — Ambulatory Visit: Payer: Self-pay | Admitting: Oncology

## 2013-11-01 VITALS — BP 130/84 | HR 80 | Resp 14 | Ht 66.0 in | Wt 164.0 lb

## 2013-11-01 DIAGNOSIS — C180 Malignant neoplasm of cecum: Secondary | ICD-10-CM

## 2013-11-01 NOTE — Patient Instructions (Signed)
Patient to return in 6 weeks for a follow up. The patient is aware to call back for any questions or concerns.

## 2013-11-01 NOTE — Progress Notes (Signed)
Patient ID: Melissa Fitzgerald, female   DOB: 03-27-1945, 67 y.o.   MRN: [id number]  The patient presents for a post op port placement evaluation. The procedure was performed on 10/24/13. The patient states she is having a lot of soreness. She states the tylenol she is using is helping minimal.   Patient complaining of pain at the port site but site is clean and healing well. No signs of infection or seroma. Abdomen soft, incision and port sites well healed. Pt is now 1 mo post right colectomy, port placement. Reassured evrything is well. Pain at port site should improve. Patient to return in 6 weeks for follow up.

## 2013-11-02 LAB — COMPREHENSIVE METABOLIC PANEL
Albumin: 3.4 g/dL (ref 3.4–5.0)
Alkaline Phosphatase: 70 U/L
Anion Gap: 8 (ref 7–16)
BUN: 12 mg/dL (ref 7–18)
Bilirubin,Total: 0.2 mg/dL (ref 0.2–1.0)
Calcium, Total: 8.6 mg/dL (ref 8.5–10.1)
Chloride: 107 mmol/L (ref 98–107)
Co2: 28 mmol/L (ref 21–32)
Creatinine: 0.78 mg/dL (ref 0.60–1.30)
EGFR (African American): >60
EGFR (Non-African Amer.): >60
Glucose: 102 mg/dL — ABNORMAL HIGH (ref 65–99)
Osmolality: 285 (ref 275–301)
Potassium: 3.1 mmol/L — ABNORMAL LOW (ref 3.5–5.1)
SGOT(AST): 16 U/L (ref 15–37)
SGPT (ALT): 18 U/L
Sodium: 143 mmol/L (ref 136–145)
Total Protein: 7.7 g/dL (ref 6.4–8.2)

## 2013-11-02 LAB — CBC CANCER CENTER
Basophil #: 0 10*3/uL
Basophil %: 0.7 %
Eosinophil #: 0.1 10*3/uL
Eosinophil %: 1.8 %
HCT: 28.9 % — ABNORMAL LOW
HGB: 9 g/dL — ABNORMAL LOW
Lymphocyte %: 34.5 %
Lymphs Abs: 1.2 10*3/uL
MCH: 27 pg
MCHC: 31.2 g/dL — ABNORMAL LOW
MCV: 87 fL
Monocyte #: 0.3 10*3/uL
Monocyte %: 8.4 %
Neutrophil #: 1.9 10*3/uL
Neutrophil %: 54.6 %
Platelet: 274 10*3/uL
RBC: 3.33 10*6/uL — ABNORMAL LOW
RDW: 22.1 % — ABNORMAL HIGH
WBC: 3.5 10*3/uL — ABNORMAL LOW

## 2013-11-16 LAB — CBC CANCER CENTER
Basophil #: 0 x10 3/mm (ref 0.0–0.1)
Basophil %: 0.9 %
Eosinophil #: 0 x10 3/mm (ref 0.0–0.7)
Eosinophil %: 1.9 %
HCT: 28.8 % — ABNORMAL LOW (ref 35.0–47.0)
HGB: 9.1 g/dL — ABNORMAL LOW (ref 12.0–16.0)
Lymphocyte #: 1.3 x10 3/mm (ref 1.0–3.6)
Lymphocyte %: 49 %
MCH: 27.4 pg (ref 26.0–34.0)
MCHC: 31.5 g/dL — ABNORMAL LOW (ref 32.0–36.0)
MCV: 87 fL (ref 80–100)
Monocyte #: 0.2 x10 3/mm (ref 0.2–0.9)
Monocyte %: 8.4 %
Neutrophil #: 1.1 x10 3/mm — ABNORMAL LOW (ref 1.4–6.5)
Neutrophil %: 39.8 %
Platelet: 227 x10 3/mm (ref 150–440)
RBC: 3.3 10*6/uL — ABNORMAL LOW (ref 3.80–5.20)
RDW: 20.4 % — ABNORMAL HIGH (ref 11.5–14.5)
WBC: 2.6 x10 3/mm — ABNORMAL LOW (ref 3.6–11.0)

## 2013-11-16 LAB — COMPREHENSIVE METABOLIC PANEL
Albumin: 3.5 g/dL (ref 3.4–5.0)
Alkaline Phosphatase: 72 U/L
Anion Gap: 9 (ref 7–16)
BUN: 15 mg/dL (ref 7–18)
Bilirubin,Total: 0.2 mg/dL (ref 0.2–1.0)
Calcium, Total: 8.3 mg/dL — ABNORMAL LOW (ref 8.5–10.1)
Chloride: 107 mmol/L (ref 98–107)
Co2: 28 mmol/L (ref 21–32)
Creatinine: 0.8 mg/dL (ref 0.60–1.30)
EGFR (African American): >60
EGFR (Non-African Amer.): >60
Glucose: 89 mg/dL (ref 65–99)
Osmolality: 287 (ref 275–301)
Potassium: 3.3 mmol/L — ABNORMAL LOW (ref 3.5–5.1)
SGOT(AST): 21 U/L (ref 15–37)
SGPT (ALT): 26 U/L
Sodium: 144 mmol/L (ref 136–145)
Total Protein: 7.8 g/dL (ref 6.4–8.2)

## 2013-11-22 ENCOUNTER — Ambulatory Visit: Payer: Self-pay | Admitting: Oncology

## 2013-11-29 ENCOUNTER — Telehealth: Payer: Self-pay | Admitting: *Deleted

## 2013-11-29 NOTE — Telephone Encounter (Signed)
Pt is concerned about her port- she stated it was a knot there, wanted to talk to you about it.

## 2013-11-29 NOTE — Telephone Encounter (Signed)
I talked with the patient, she feels a round knot and has some head congestion. She has an appointment with the cancer center tomorrow and she is aware that they can better assess it prior to her chemo treatment. I did inform her that the port itself was a round device. Pt agrees.

## 2013-11-30 LAB — COMPREHENSIVE METABOLIC PANEL
Albumin: 3.5 g/dL (ref 3.4–5.0)
Alkaline Phosphatase: 82 U/L
Anion Gap: 8 (ref 7–16)
BUN: 9 mg/dL (ref 7–18)
Bilirubin,Total: 0.2 mg/dL (ref 0.2–1.0)
Calcium, Total: 8.4 mg/dL — ABNORMAL LOW (ref 8.5–10.1)
Chloride: 106 mmol/L (ref 98–107)
Co2: 29 mmol/L (ref 21–32)
Creatinine: 0.82 mg/dL (ref 0.60–1.30)
EGFR (African American): >60
EGFR (Non-African Amer.): >60
Glucose: 98 mg/dL (ref 65–99)
Osmolality: 284 (ref 275–301)
Potassium: 3 mmol/L — ABNORMAL LOW (ref 3.5–5.1)
SGOT(AST): 19 U/L (ref 15–37)
SGPT (ALT): 17 U/L
Sodium: 143 mmol/L (ref 136–145)
Total Protein: 7.7 g/dL (ref 6.4–8.2)

## 2013-11-30 LAB — CBC CANCER CENTER
Basophil #: 0 x10 3/mm (ref 0.0–0.1)
Basophil %: 0.6 %
Eosinophil #: 0.1 x10 3/mm (ref 0.0–0.7)
Eosinophil %: 0.9 %
HCT: 29.4 % — ABNORMAL LOW (ref 35.0–47.0)
HGB: 9.3 g/dL — ABNORMAL LOW (ref 12.0–16.0)
Lymphocyte #: 1.2 x10 3/mm (ref 1.0–3.6)
Lymphocyte %: 20 %
MCH: 28 pg (ref 26.0–34.0)
MCHC: 31.6 g/dL — ABNORMAL LOW (ref 32.0–36.0)
MCV: 89 fL (ref 80–100)
Monocyte #: 0.6 x10 3/mm (ref 0.2–0.9)
Monocyte %: 9.9 %
Neutrophil #: 3.9 x10 3/mm (ref 1.4–6.5)
Neutrophil %: 68.6 %
Platelet: 167 x10 3/mm (ref 150–440)
RBC: 3.33 10*6/uL — ABNORMAL LOW (ref 3.80–5.20)
RDW: 17.8 % — ABNORMAL HIGH (ref 11.5–14.5)
WBC: 5.7 x10 3/mm (ref 3.6–11.0)

## 2013-11-30 LAB — MAGNESIUM: Magnesium: 1.9 mg/dL

## 2013-12-13 ENCOUNTER — Ambulatory Visit (INDEPENDENT_AMBULATORY_CARE_PROVIDER_SITE_OTHER): Payer: Self-pay | Admitting: General Surgery

## 2013-12-13 ENCOUNTER — Encounter: Payer: Self-pay | Admitting: General Surgery

## 2013-12-13 VITALS — BP 120/78 | HR 70 | Resp 12 | Ht 66.0 in | Wt 161.0 lb

## 2013-12-13 DIAGNOSIS — C180 Malignant neoplasm of cecum: Secondary | ICD-10-CM

## 2013-12-13 NOTE — Patient Instructions (Signed)
Patient to return in three months.  

## 2013-12-13 NOTE — Progress Notes (Signed)
The patient presents for a post op port placement evaluation. The procedure was performed on 10/24/13. Prior to that she lap/right colectomy for T3,N1 CA.The patient states she is having a lot of soreness. She states the tylenol she is using is helping minimal.  Port site is clean and healing well.  Abdomen is soft, incision well healed. Pt to call with any abdominal problems. F/U in 3 mos.

## 2013-12-14 LAB — COMPREHENSIVE METABOLIC PANEL
Albumin: 3.5 g/dL (ref 3.4–5.0)
Alkaline Phosphatase: 82 U/L
Anion Gap: 8 (ref 7–16)
BUN: 15 mg/dL (ref 7–18)
Bilirubin,Total: 0.2 mg/dL (ref 0.2–1.0)
Calcium, Total: 8.8 mg/dL (ref 8.5–10.1)
Chloride: 103 mmol/L (ref 98–107)
Co2: 29 mmol/L (ref 21–32)
Creatinine: 0.83 mg/dL (ref 0.60–1.30)
EGFR (African American): >60
EGFR (Non-African Amer.): >60
Glucose: 94 mg/dL (ref 65–99)
Osmolality: 280 (ref 275–301)
Potassium: 3 mmol/L — ABNORMAL LOW (ref 3.5–5.1)
SGOT(AST): 20 U/L (ref 15–37)
SGPT (ALT): 21 U/L
Sodium: 140 mmol/L (ref 136–145)
Total Protein: 7.4 g/dL (ref 6.4–8.2)

## 2013-12-14 LAB — CBC CANCER CENTER
Basophil #: 0 x10 3/mm (ref 0.0–0.1)
Basophil %: 1.4 %
Eosinophil #: 0 x10 3/mm (ref 0.0–0.7)
Eosinophil %: 1.6 %
HCT: 29.7 % — ABNORMAL LOW (ref 35.0–47.0)
HGB: 9.4 g/dL — ABNORMAL LOW (ref 12.0–16.0)
Lymphocyte #: 0.9 x10 3/mm — ABNORMAL LOW (ref 1.0–3.6)
Lymphocyte %: 29.8 %
MCH: 28 pg (ref 26.0–34.0)
MCHC: 31.7 g/dL — ABNORMAL LOW (ref 32.0–36.0)
MCV: 88 fL (ref 80–100)
Monocyte #: 0.3 x10 3/mm (ref 0.2–0.9)
Monocyte %: 9.8 %
Neutrophil #: 1.7 x10 3/mm (ref 1.4–6.5)
Neutrophil %: 57.4 %
Platelet: 200 x10 3/mm (ref 150–440)
RBC: 3.37 10*6/uL — ABNORMAL LOW (ref 3.80–5.20)
RDW: 16.9 % — ABNORMAL HIGH (ref 11.5–14.5)
WBC: 3 x10 3/mm — ABNORMAL LOW (ref 3.6–11.0)

## 2013-12-14 LAB — MAGNESIUM: Magnesium: 2 mg/dL

## 2013-12-16 LAB — POTASSIUM: Potassium: 3 mmol/L — ABNORMAL LOW (ref 3.5–5.1)

## 2013-12-22 ENCOUNTER — Ambulatory Visit: Payer: Self-pay | Admitting: Oncology

## 2013-12-28 LAB — CBC CANCER CENTER
Basophil #: 0 x10 3/mm (ref 0.0–0.1)
Basophil %: 1 %
Eosinophil #: 0.1 x10 3/mm (ref 0.0–0.7)
Eosinophil %: 2.3 %
HCT: 29.3 % — ABNORMAL LOW (ref 35.0–47.0)
HGB: 9.3 g/dL — ABNORMAL LOW (ref 12.0–16.0)
Lymphocyte #: 1.1 x10 3/mm (ref 1.0–3.6)
Lymphocyte %: 34 %
MCH: 28.6 pg (ref 26.0–34.0)
MCHC: 31.8 g/dL — ABNORMAL LOW (ref 32.0–36.0)
MCV: 90 fL (ref 80–100)
Monocyte #: 0.4 x10 3/mm (ref 0.2–0.9)
Monocyte %: 11.3 %
Neutrophil #: 1.7 x10 3/mm (ref 1.4–6.5)
Neutrophil %: 51.4 %
Platelet: 167 x10 3/mm (ref 150–440)
RBC: 3.26 10*6/uL — ABNORMAL LOW (ref 3.80–5.20)
RDW: 17.3 % — ABNORMAL HIGH (ref 11.5–14.5)
WBC: 3.3 x10 3/mm — ABNORMAL LOW (ref 3.6–11.0)

## 2013-12-28 LAB — COMPREHENSIVE METABOLIC PANEL
Albumin: 3.6 g/dL (ref 3.4–5.0)
Alkaline Phosphatase: 76 U/L
Anion Gap: 6 — ABNORMAL LOW (ref 7–16)
BUN: 11 mg/dL (ref 7–18)
Bilirubin,Total: 0.2 mg/dL (ref 0.2–1.0)
Calcium, Total: 8.6 mg/dL (ref 8.5–10.1)
Chloride: 108 mmol/L — ABNORMAL HIGH (ref 98–107)
Co2: 30 mmol/L (ref 21–32)
Creatinine: 0.72 mg/dL (ref 0.60–1.30)
EGFR (African American): >60
EGFR (Non-African Amer.): >60
Glucose: 95 mg/dL (ref 65–99)
Osmolality: 286 (ref 275–301)
Potassium: 3.7 mmol/L (ref 3.5–5.1)
SGOT(AST): 22 U/L (ref 15–37)
SGPT (ALT): 25 U/L
Sodium: 144 mmol/L (ref 136–145)
Total Protein: 7.1 g/dL (ref 6.4–8.2)

## 2013-12-28 LAB — MAGNESIUM: Magnesium: 2.1 mg/dL

## 2014-01-11 LAB — COMPREHENSIVE METABOLIC PANEL
Albumin: 3.6 g/dL (ref 3.4–5.0)
Alkaline Phosphatase: 76 U/L
Anion Gap: 6 — ABNORMAL LOW (ref 7–16)
BUN: 11 mg/dL (ref 7–18)
Bilirubin,Total: 0.3 mg/dL (ref 0.2–1.0)
Calcium, Total: 8.6 mg/dL (ref 8.5–10.1)
Chloride: 107 mmol/L (ref 98–107)
Co2: 30 mmol/L (ref 21–32)
Creatinine: 0.74 mg/dL (ref 0.60–1.30)
EGFR (African American): >60
EGFR (Non-African Amer.): >60
Glucose: 89 mg/dL (ref 65–99)
Osmolality: 284 (ref 275–301)
Potassium: 3.6 mmol/L (ref 3.5–5.1)
SGOT(AST): 18 U/L (ref 15–37)
SGPT (ALT): 21 U/L
Sodium: 143 mmol/L (ref 136–145)
Total Protein: 7.3 g/dL (ref 6.4–8.2)

## 2014-01-11 LAB — CBC CANCER CENTER
Basophil #: 0 x10 3/mm (ref 0.0–0.1)
Basophil %: 1.1 %
Eosinophil #: 0.1 x10 3/mm (ref 0.0–0.7)
Eosinophil %: 2.1 %
HCT: 28.3 % — ABNORMAL LOW (ref 35.0–47.0)
HGB: 9 g/dL — ABNORMAL LOW (ref 12.0–16.0)
Lymphocyte #: 1 x10 3/mm (ref 1.0–3.6)
Lymphocyte %: 34.4 %
MCH: 28.6 pg (ref 26.0–34.0)
MCHC: 31.9 g/dL — ABNORMAL LOW (ref 32.0–36.0)
MCV: 90 fL (ref 80–100)
Monocyte #: 0.3 x10 3/mm (ref 0.2–0.9)
Monocyte %: 9.3 %
Neutrophil #: 1.5 x10 3/mm (ref 1.4–6.5)
Neutrophil %: 53.1 %
Platelet: 155 x10 3/mm (ref 150–440)
RBC: 3.16 10*6/uL — ABNORMAL LOW (ref 3.80–5.20)
RDW: 17.3 % — ABNORMAL HIGH (ref 11.5–14.5)
WBC: 2.8 x10 3/mm — ABNORMAL LOW (ref 3.6–11.0)

## 2014-01-11 LAB — MAGNESIUM: Magnesium: 2.1 mg/dL

## 2014-01-22 ENCOUNTER — Ambulatory Visit: Payer: Self-pay | Admitting: Oncology

## 2014-01-23 ENCOUNTER — Encounter: Payer: Self-pay | Admitting: General Surgery

## 2014-01-25 LAB — CBC CANCER CENTER
Basophil #: 0 x10 3/mm (ref 0.0–0.1)
Basophil %: 0.9 %
Eosinophil #: 0 x10 3/mm (ref 0.0–0.7)
Eosinophil %: 1.6 %
HCT: 27.1 % — ABNORMAL LOW (ref 35.0–47.0)
HGB: 8.6 g/dL — ABNORMAL LOW (ref 12.0–16.0)
Lymphocyte #: 0.9 x10 3/mm — ABNORMAL LOW (ref 1.0–3.6)
Lymphocyte %: 38.7 %
MCH: 28.9 pg (ref 26.0–34.0)
MCHC: 31.8 g/dL — ABNORMAL LOW (ref 32.0–36.0)
MCV: 91 fL (ref 80–100)
Monocyte #: 0.2 x10 3/mm (ref 0.2–0.9)
Monocyte %: 10.1 %
Neutrophil #: 1.2 x10 3/mm — ABNORMAL LOW (ref 1.4–6.5)
Neutrophil %: 48.7 %
Platelet: 163 x10 3/mm (ref 150–440)
RBC: 2.98 10*6/uL — ABNORMAL LOW (ref 3.80–5.20)
RDW: 17.4 % — ABNORMAL HIGH (ref 11.5–14.5)
WBC: 2.4 x10 3/mm — ABNORMAL LOW (ref 3.6–11.0)

## 2014-01-25 LAB — COMPREHENSIVE METABOLIC PANEL
Albumin: 3.6 g/dL (ref 3.4–5.0)
Alkaline Phosphatase: 77 U/L
Anion Gap: 6 — ABNORMAL LOW (ref 7–16)
BUN: 15 mg/dL (ref 7–18)
Bilirubin,Total: 0.3 mg/dL (ref 0.2–1.0)
Calcium, Total: 8.6 mg/dL (ref 8.5–10.1)
Chloride: 106 mmol/L (ref 98–107)
Co2: 28 mmol/L (ref 21–32)
Creatinine: 0.76 mg/dL (ref 0.60–1.30)
EGFR (African American): >60
EGFR (Non-African Amer.): >60
Glucose: 107 mg/dL — ABNORMAL HIGH (ref 65–99)
Osmolality: 281 (ref 275–301)
Potassium: 3.3 mmol/L — ABNORMAL LOW (ref 3.5–5.1)
SGOT(AST): 17 U/L (ref 15–37)
SGPT (ALT): 19 U/L
Sodium: 140 mmol/L (ref 136–145)
Total Protein: 7.3 g/dL (ref 6.4–8.2)

## 2014-02-07 ENCOUNTER — Emergency Department: Payer: Self-pay | Admitting: Internal Medicine

## 2014-02-07 LAB — URINALYSIS, COMPLETE
Bacteria: NONE SEEN
Bilirubin,UR: NEGATIVE
Glucose,UR: NEGATIVE mg/dL (ref 0–75)
Ketone: NEGATIVE
Leukocyte Esterase: NEGATIVE
Nitrite: NEGATIVE
Ph: 9 (ref 4.5–8.0)
Protein: 30
RBC,UR: 909 /HPF (ref 0–5)
Specific Gravity: 1.012 (ref 1.003–1.030)
Squamous Epithelial: 1
WBC UR: 57 /HPF (ref 0–5)

## 2014-02-07 LAB — CBC
HCT: 29.1 % — ABNORMAL LOW (ref 35.0–47.0)
HGB: 9.2 g/dL — ABNORMAL LOW (ref 12.0–16.0)
MCH: 29.4 pg (ref 26.0–34.0)
MCHC: 31.5 g/dL — ABNORMAL LOW (ref 32.0–36.0)
MCV: 93 fL (ref 80–100)
Platelet: 212 10*3/uL (ref 150–440)
RBC: 3.12 10*6/uL — ABNORMAL LOW (ref 3.80–5.20)
RDW: 19.2 % — ABNORMAL HIGH (ref 11.5–14.5)
WBC: 6.1 10*3/uL (ref 3.6–11.0)

## 2014-02-07 LAB — COMPREHENSIVE METABOLIC PANEL
Albumin: 4 g/dL (ref 3.4–5.0)
Alkaline Phosphatase: 81 U/L
Anion Gap: 8 (ref 7–16)
BUN: 11 mg/dL (ref 7–18)
Bilirubin,Total: 0.4 mg/dL (ref 0.2–1.0)
Calcium, Total: 8.9 mg/dL (ref 8.5–10.1)
Chloride: 106 mmol/L (ref 98–107)
Co2: 26 mmol/L (ref 21–32)
Creatinine: 0.98 mg/dL (ref 0.60–1.30)
EGFR (African American): >60
EGFR (Non-African Amer.): 60 — ABNORMAL LOW
Glucose: 121 mg/dL — ABNORMAL HIGH (ref 65–99)
Osmolality: 280 (ref 275–301)
Potassium: 3.8 mmol/L (ref 3.5–5.1)
SGOT(AST): 23 U/L (ref 15–37)
SGPT (ALT): 18 U/L
Sodium: 140 mmol/L (ref 136–145)
Total Protein: 7.9 g/dL (ref 6.4–8.2)

## 2014-02-07 LAB — CK TOTAL AND CKMB (NOT AT ARMC)
CK, Total: 56 U/L
CK-MB: <0.5 ng/mL — ABNORMAL LOW (ref 0.5–3.6)

## 2014-02-07 LAB — TROPONIN I: Troponin-I: <0.02 ng/mL

## 2014-02-08 LAB — COMPREHENSIVE METABOLIC PANEL WITH GFR
Albumin: 3.6 g/dL
Alkaline Phosphatase: 79 U/L
Anion Gap: 6 — ABNORMAL LOW
BUN: 17 mg/dL
Bilirubin,Total: 0.5 mg/dL
Calcium, Total: 8.2 mg/dL — ABNORMAL LOW
Chloride: 106 mmol/L
Co2: 28 mmol/L
Creatinine: 1.47 mg/dL — ABNORMAL HIGH
EGFR (African American): 46 — ABNORMAL LOW
EGFR (Non-African Amer.): 38 — ABNORMAL LOW
Glucose: 106 mg/dL — ABNORMAL HIGH
Osmolality: 281
Potassium: 3.8 mmol/L
SGOT(AST): 27 U/L
SGPT (ALT): 15 U/L
Sodium: 140 mmol/L
Total Protein: 7.5 g/dL

## 2014-02-08 LAB — CBC CANCER CENTER
Basophil #: 0 x10 3/mm (ref 0.0–0.1)
Basophil %: 0.3 %
Eosinophil #: 0 x10 3/mm (ref 0.0–0.7)
Eosinophil %: 0.2 %
HCT: 28 % — ABNORMAL LOW (ref 35.0–47.0)
HGB: 8.8 g/dL — ABNORMAL LOW (ref 12.0–16.0)
Lymphocyte #: 0.7 x10 3/mm — ABNORMAL LOW (ref 1.0–3.6)
Lymphocyte %: 12.5 %
MCH: 29.1 pg (ref 26.0–34.0)
MCHC: 31.2 g/dL — ABNORMAL LOW (ref 32.0–36.0)
MCV: 93 fL (ref 80–100)
Monocyte #: 0.4 x10 3/mm (ref 0.2–0.9)
Monocyte %: 7.7 %
Neutrophil #: 4.2 x10 3/mm (ref 1.4–6.5)
Neutrophil %: 79.3 %
Platelet: 193 x10 3/mm (ref 150–440)
RBC: 3.01 10*6/uL — ABNORMAL LOW (ref 3.80–5.20)
RDW: 19.1 % — ABNORMAL HIGH (ref 11.5–14.5)
WBC: 5.3 x10 3/mm (ref 3.6–11.0)

## 2014-02-08 LAB — MAGNESIUM: Magnesium: 2 mg/dL

## 2014-02-15 ENCOUNTER — Ambulatory Visit: Payer: Self-pay | Admitting: Urology

## 2014-02-21 ENCOUNTER — Ambulatory Visit: Payer: Self-pay | Admitting: Oncology

## 2014-02-22 LAB — COMPREHENSIVE METABOLIC PANEL
Albumin: 3.7 g/dL (ref 3.4–5.0)
Alkaline Phosphatase: 75 U/L
Anion Gap: 9 (ref 7–16)
BUN: 14 mg/dL (ref 7–18)
Bilirubin,Total: 0.4 mg/dL (ref 0.2–1.0)
Calcium, Total: 8.7 mg/dL (ref 8.5–10.1)
Chloride: 108 mmol/L — ABNORMAL HIGH (ref 98–107)
Co2: 29 mmol/L (ref 21–32)
Creatinine: 0.79 mg/dL (ref 0.60–1.30)
EGFR (African American): >60
EGFR (Non-African Amer.): >60
Glucose: 90 mg/dL (ref 65–99)
Osmolality: 291 (ref 275–301)
Potassium: 3.2 mmol/L — ABNORMAL LOW (ref 3.5–5.1)
SGOT(AST): 21 U/L (ref 15–37)
SGPT (ALT): 19 U/L
Sodium: 146 mmol/L — ABNORMAL HIGH (ref 136–145)
Total Protein: 7.4 g/dL (ref 6.4–8.2)

## 2014-02-22 LAB — CBC CANCER CENTER
Basophil #: 0 x10 3/mm (ref 0.0–0.1)
Basophil %: 0.7 %
Eosinophil #: 0.1 x10 3/mm (ref 0.0–0.7)
Eosinophil %: 1.4 %
HCT: 27 % — ABNORMAL LOW (ref 35.0–47.0)
HGB: 8.7 g/dL — ABNORMAL LOW (ref 12.0–16.0)
Lymphocyte #: 1 x10 3/mm (ref 1.0–3.6)
Lymphocyte %: 23.1 %
MCH: 30.3 pg (ref 26.0–34.0)
MCHC: 32.2 g/dL (ref 32.0–36.0)
MCV: 94 fL (ref 80–100)
Monocyte #: 0.4 x10 3/mm (ref 0.2–0.9)
Monocyte %: 8.2 %
Neutrophil #: 2.9 x10 3/mm (ref 1.4–6.5)
Neutrophil %: 66.6 %
Platelet: 230 x10 3/mm (ref 150–440)
RBC: 2.86 10*6/uL — ABNORMAL LOW (ref 3.80–5.20)
RDW: 18.6 % — ABNORMAL HIGH (ref 11.5–14.5)
WBC: 4.4 x10 3/mm (ref 3.6–11.0)

## 2014-02-22 LAB — MAGNESIUM: Magnesium: 1.8 mg/dL

## 2014-03-08 LAB — CBC CANCER CENTER
Basophil #: 0 x10 3/mm (ref 0.0–0.1)
Basophil %: 0.5 %
Eosinophil #: 0.1 x10 3/mm (ref 0.0–0.7)
Eosinophil %: 1.5 %
HCT: 27.3 % — ABNORMAL LOW (ref 35.0–47.0)
HGB: 8.6 g/dL — ABNORMAL LOW (ref 12.0–16.0)
Lymphocyte #: 0.8 x10 3/mm — ABNORMAL LOW (ref 1.0–3.6)
Lymphocyte %: 18.4 %
MCH: 29.8 pg (ref 26.0–34.0)
MCHC: 31.5 g/dL — ABNORMAL LOW (ref 32.0–36.0)
MCV: 95 fL (ref 80–100)
Monocyte #: 0.3 x10 3/mm (ref 0.2–0.9)
Monocyte %: 7.3 %
Neutrophil #: 3.2 x10 3/mm (ref 1.4–6.5)
Neutrophil %: 72.3 %
Platelet: 211 x10 3/mm (ref 150–440)
RBC: 2.88 10*6/uL — ABNORMAL LOW (ref 3.80–5.20)
RDW: 17.8 % — ABNORMAL HIGH (ref 11.5–14.5)
WBC: 4.4 x10 3/mm (ref 3.6–11.0)

## 2014-03-08 LAB — COMPREHENSIVE METABOLIC PANEL
Albumin: 3.7 g/dL (ref 3.4–5.0)
Alkaline Phosphatase: 84 U/L
Anion Gap: 6 — ABNORMAL LOW (ref 7–16)
BUN: 11 mg/dL (ref 7–18)
Bilirubin,Total: 0.4 mg/dL (ref 0.2–1.0)
Calcium, Total: 8.4 mg/dL — ABNORMAL LOW (ref 8.5–10.1)
Chloride: 107 mmol/L (ref 98–107)
Co2: 30 mmol/L (ref 21–32)
Creatinine: 0.83 mg/dL (ref 0.60–1.30)
EGFR (African American): >60
EGFR (Non-African Amer.): >60
Glucose: 108 mg/dL — ABNORMAL HIGH (ref 65–99)
Osmolality: 285 (ref 275–301)
Potassium: 3.6 mmol/L (ref 3.5–5.1)
SGOT(AST): 17 U/L (ref 15–37)
SGPT (ALT): 20 U/L
Sodium: 143 mmol/L (ref 136–145)
Total Protein: 7.4 g/dL (ref 6.4–8.2)

## 2014-03-14 ENCOUNTER — Ambulatory Visit (INDEPENDENT_AMBULATORY_CARE_PROVIDER_SITE_OTHER): Payer: Commercial Managed Care - HMO | Admitting: General Surgery

## 2014-03-14 ENCOUNTER — Encounter: Payer: Self-pay | Admitting: General Surgery

## 2014-03-14 VITALS — BP 130/80 | HR 78 | Resp 12 | Ht 66.0 in | Wt 169.0 lb

## 2014-03-14 DIAGNOSIS — C180 Malignant neoplasm of cecum: Secondary | ICD-10-CM

## 2014-03-14 NOTE — Progress Notes (Signed)
Patient ID: Melissa Fitzgerald, female   DOB: Nov 13, 1945, 68 y.o.   MRN: [id number]  Chief Complaint  Patient presents with  . Follow-up    breast cancer    HPI Melissa Fitzgerald is a 68 y.o. female here today follow up from colon cancer. She has passed a kidney stone since her last visit. She has been a little dizzy for the past 3 days. She goes next week for another chemotherapy treatment, then 2 more in January. She is 5 months postop laparoscopy lysis of adhesions and right colectomy done on 09/28/13. Bowels are normal.   HPI  Past Medical History  Diagnosis Date  . Irritable bowel disease   . Colon polyp   . Anemia   . Thyroid disease   . Hyperlipidemia   . Kidney stone     Past Surgical History  Procedure Laterality Date  . Colonoscopy  09-13-13    Dr [name]  . Tonsillectomy    . Kidney stone surgery    . Thyroid surgery    . Foot surgery    . Nasal sinus surgery    . Abdominal hysterectomy    . Cholecystectomy  2000  . Portacath placement  10/24/13  . Breast enhancement surgery      Family History  Problem Relation Age of Onset  . Colon cancer      breast cancer/first cousin    Social History History  Substance Use Topics  . Smoking status: Current Some Day Smoker -- 0.25 packs/day    Types: Cigarettes  . Smokeless tobacco: Not on file  . Alcohol Use: Yes     Comment: occasionally    Allergies  Allergen Reactions  . Codeine Nausea And Vomiting  . Oxycodone Nausea And Vomiting  . Penicillins Nausea And Vomiting    Current Outpatient Prescriptions  Medication Sig Dispense Refill  . esomeprazole (NEXIUM) 40 MG capsule Take 40 mg by mouth daily at 12 noon.    Marland Kitchen levothyroxine (SYNTHROID, LEVOTHROID) 100 MCG tablet Take 100 mcg by mouth daily before breakfast.    . metoprolol succinate (TOPROL-XL) 25 MG 24 hr tablet Take 25 mg by mouth daily.    . metroNIDAZOLE (FLAGYL) 500 MG tablet Take 1 (one) tablet at 6 pm and 1 (one) tablet at 11 pm evening prior to  surgery 2 tablet 0   No current facility-administered medications for this visit.    Review of Systems Review of Systems  Constitutional: Negative.   Respiratory: Negative.   Cardiovascular: Negative.   Neurological: Positive for dizziness.    Blood pressure 130/80, pulse 78, resp. rate 12, height 5\' 6"  (1.676 m), weight 169 lb (76.658 kg).  Physical Exam Physical Exam  Constitutional: She is oriented to person, place, and time. She appears well-developed and well-nourished.  Eyes: Conjunctivae are normal. No scleral icterus.  Neck: Neck supple.  Cardiovascular: Normal rate, regular rhythm and normal heart sounds.   Pulmonary/Chest: Effort normal and breath sounds normal.  Abdominal: Soft. There is no tenderness.  Lymphadenopathy:    She has no cervical adenopathy.  Neurological: She is alert and oriented to person, place, and time.  Skin: Skin is warm and dry.    Data Reviewed none  Assessment    Stable physical exam. Incision is well healed, no hernias  Noted. S/P right colectomy for T3,N1 ca. Chemo ongoing.    Plan    Follow up in 6 months. Plan for colonoscopy mid 2016.       Doniven Vanpatten G  03/14/2014, 7:42 PM

## 2014-03-14 NOTE — Patient Instructions (Signed)
The patient is aware to call back for any questions or concerns.  

## 2014-03-22 LAB — CBC CANCER CENTER
Basophil #: 0 x10 3/mm (ref 0.0–0.1)
Basophil %: 0.8 %
Eosinophil #: 0.1 x10 3/mm (ref 0.0–0.7)
Eosinophil %: 2.1 %
HCT: 27.6 % — ABNORMAL LOW (ref 35.0–47.0)
HGB: 8.7 g/dL — ABNORMAL LOW (ref 12.0–16.0)
Lymphocyte #: 1 x10 3/mm (ref 1.0–3.6)
Lymphocyte %: 36.5 %
MCH: 29.8 pg (ref 26.0–34.0)
MCHC: 31.5 g/dL — ABNORMAL LOW (ref 32.0–36.0)
MCV: 95 fL (ref 80–100)
Monocyte #: 0.2 x10 3/mm (ref 0.2–0.9)
Monocyte %: 8.2 %
Neutrophil #: 1.4 x10 3/mm (ref 1.4–6.5)
Neutrophil %: 52.4 %
Platelet: 192 x10 3/mm (ref 150–440)
RBC: 2.92 10*6/uL — ABNORMAL LOW (ref 3.80–5.20)
RDW: 17.2 % — ABNORMAL HIGH (ref 11.5–14.5)
WBC: 2.6 x10 3/mm — ABNORMAL LOW (ref 3.6–11.0)

## 2014-03-22 LAB — BASIC METABOLIC PANEL
Anion Gap: 10 (ref 7–16)
BUN: 13 mg/dL (ref 7–18)
Calcium, Total: 8.2 mg/dL — ABNORMAL LOW (ref 8.5–10.1)
Chloride: 107 mmol/L (ref 98–107)
Co2: 29 mmol/L (ref 21–32)
Creatinine: 0.82 mg/dL (ref 0.60–1.30)
EGFR (African American): >60
EGFR (Non-African Amer.): >60
Glucose: 114 mg/dL — ABNORMAL HIGH (ref 65–99)
Osmolality: 292 (ref 275–301)
Potassium: 3.5 mmol/L (ref 3.5–5.1)
Sodium: 146 mmol/L — ABNORMAL HIGH (ref 136–145)

## 2014-03-24 ENCOUNTER — Ambulatory Visit: Payer: Self-pay | Admitting: Oncology

## 2014-03-24 DIAGNOSIS — I10 Essential (primary) hypertension: Secondary | ICD-10-CM | POA: Diagnosis not present

## 2014-03-24 DIAGNOSIS — K589 Irritable bowel syndrome without diarrhea: Secondary | ICD-10-CM | POA: Diagnosis not present

## 2014-03-24 DIAGNOSIS — Z79899 Other long term (current) drug therapy: Secondary | ICD-10-CM | POA: Diagnosis not present

## 2014-03-24 DIAGNOSIS — C182 Malignant neoplasm of ascending colon: Secondary | ICD-10-CM | POA: Diagnosis not present

## 2014-03-24 DIAGNOSIS — K219 Gastro-esophageal reflux disease without esophagitis: Secondary | ICD-10-CM | POA: Diagnosis not present

## 2014-03-24 DIAGNOSIS — Z452 Encounter for adjustment and management of vascular access device: Secondary | ICD-10-CM | POA: Diagnosis not present

## 2014-03-24 DIAGNOSIS — G629 Polyneuropathy, unspecified: Secondary | ICD-10-CM | POA: Diagnosis not present

## 2014-03-24 DIAGNOSIS — D649 Anemia, unspecified: Secondary | ICD-10-CM | POA: Diagnosis not present

## 2014-03-24 DIAGNOSIS — Z5111 Encounter for antineoplastic chemotherapy: Secondary | ICD-10-CM | POA: Diagnosis not present

## 2014-04-04 DIAGNOSIS — C180 Malignant neoplasm of cecum: Secondary | ICD-10-CM | POA: Diagnosis not present

## 2014-04-05 DIAGNOSIS — D649 Anemia, unspecified: Secondary | ICD-10-CM | POA: Diagnosis not present

## 2014-04-05 DIAGNOSIS — K219 Gastro-esophageal reflux disease without esophagitis: Secondary | ICD-10-CM | POA: Diagnosis not present

## 2014-04-05 DIAGNOSIS — C180 Malignant neoplasm of cecum: Secondary | ICD-10-CM | POA: Diagnosis not present

## 2014-04-05 DIAGNOSIS — Z79899 Other long term (current) drug therapy: Secondary | ICD-10-CM | POA: Diagnosis not present

## 2014-04-05 DIAGNOSIS — Z452 Encounter for adjustment and management of vascular access device: Secondary | ICD-10-CM | POA: Diagnosis not present

## 2014-04-05 DIAGNOSIS — C182 Malignant neoplasm of ascending colon: Secondary | ICD-10-CM | POA: Diagnosis not present

## 2014-04-05 DIAGNOSIS — Z5111 Encounter for antineoplastic chemotherapy: Secondary | ICD-10-CM | POA: Diagnosis not present

## 2014-04-05 DIAGNOSIS — K589 Irritable bowel syndrome without diarrhea: Secondary | ICD-10-CM | POA: Diagnosis not present

## 2014-04-05 DIAGNOSIS — I10 Essential (primary) hypertension: Secondary | ICD-10-CM | POA: Diagnosis not present

## 2014-04-05 DIAGNOSIS — G629 Polyneuropathy, unspecified: Secondary | ICD-10-CM | POA: Diagnosis not present

## 2014-04-05 LAB — CBC CANCER CENTER
Basophil #: 0 x10 3/mm (ref 0.0–0.1)
Basophil %: 0.6 %
Eosinophil #: 0.1 x10 3/mm (ref 0.0–0.7)
Eosinophil %: 1.2 %
HCT: 30 % — ABNORMAL LOW (ref 35.0–47.0)
HGB: 9.5 g/dL — ABNORMAL LOW (ref 12.0–16.0)
Lymphocyte #: 1 x10 3/mm (ref 1.0–3.6)
Lymphocyte %: 19.1 %
MCH: 29.6 pg (ref 26.0–34.0)
MCHC: 31.8 g/dL — ABNORMAL LOW (ref 32.0–36.0)
MCV: 93 fL (ref 80–100)
Monocyte #: 0.3 x10 3/mm (ref 0.2–0.9)
Monocyte %: 5.8 %
Neutrophil #: 3.8 x10 3/mm (ref 1.4–6.5)
Neutrophil %: 73.3 %
Platelet: 181 x10 3/mm (ref 150–440)
RBC: 3.22 10*6/uL — ABNORMAL LOW (ref 3.80–5.20)
RDW: 16.8 % — ABNORMAL HIGH (ref 11.5–14.5)
WBC: 5.1 x10 3/mm (ref 3.6–11.0)

## 2014-04-05 LAB — BASIC METABOLIC PANEL
Anion Gap: 7 (ref 7–16)
BUN: 13 mg/dL (ref 7–18)
Calcium, Total: 8.5 mg/dL (ref 8.5–10.1)
Chloride: 106 mmol/L (ref 98–107)
Co2: 29 mmol/L (ref 21–32)
Creatinine: 0.99 mg/dL (ref 0.60–1.30)
EGFR (African American): >60
EGFR (Non-African Amer.): 59 — ABNORMAL LOW
Glucose: 118 mg/dL — ABNORMAL HIGH (ref 65–99)
Osmolality: 284 (ref 275–301)
Potassium: 3.5 mmol/L (ref 3.5–5.1)
Sodium: 142 mmol/L (ref 136–145)

## 2014-04-05 LAB — MAGNESIUM: Magnesium: 1.9 mg/dL

## 2014-04-07 DIAGNOSIS — I10 Essential (primary) hypertension: Secondary | ICD-10-CM | POA: Diagnosis not present

## 2014-04-07 DIAGNOSIS — G629 Polyneuropathy, unspecified: Secondary | ICD-10-CM | POA: Diagnosis not present

## 2014-04-07 DIAGNOSIS — C182 Malignant neoplasm of ascending colon: Secondary | ICD-10-CM | POA: Diagnosis not present

## 2014-04-07 DIAGNOSIS — D649 Anemia, unspecified: Secondary | ICD-10-CM | POA: Diagnosis not present

## 2014-04-07 DIAGNOSIS — K589 Irritable bowel syndrome without diarrhea: Secondary | ICD-10-CM | POA: Diagnosis not present

## 2014-04-07 DIAGNOSIS — Z5111 Encounter for antineoplastic chemotherapy: Secondary | ICD-10-CM | POA: Diagnosis not present

## 2014-04-07 DIAGNOSIS — K219 Gastro-esophageal reflux disease without esophagitis: Secondary | ICD-10-CM | POA: Diagnosis not present

## 2014-04-07 DIAGNOSIS — Z452 Encounter for adjustment and management of vascular access device: Secondary | ICD-10-CM | POA: Diagnosis not present

## 2014-04-07 DIAGNOSIS — Z79899 Other long term (current) drug therapy: Secondary | ICD-10-CM | POA: Diagnosis not present

## 2014-04-13 DIAGNOSIS — F329 Major depressive disorder, single episode, unspecified: Secondary | ICD-10-CM | POA: Diagnosis not present

## 2014-04-13 DIAGNOSIS — F5105 Insomnia due to other mental disorder: Secondary | ICD-10-CM | POA: Diagnosis not present

## 2014-04-13 DIAGNOSIS — D509 Iron deficiency anemia, unspecified: Secondary | ICD-10-CM | POA: Diagnosis not present

## 2014-04-13 DIAGNOSIS — Z0000 Encounter for general adult medical examination without abnormal findings: Secondary | ICD-10-CM | POA: Diagnosis not present

## 2014-04-13 DIAGNOSIS — E890 Postprocedural hypothyroidism: Secondary | ICD-10-CM | POA: Diagnosis not present

## 2014-04-13 DIAGNOSIS — K219 Gastro-esophageal reflux disease without esophagitis: Secondary | ICD-10-CM | POA: Diagnosis not present

## 2014-04-13 DIAGNOSIS — I10 Essential (primary) hypertension: Secondary | ICD-10-CM | POA: Diagnosis not present

## 2014-04-13 DIAGNOSIS — E785 Hyperlipidemia, unspecified: Secondary | ICD-10-CM | POA: Diagnosis not present

## 2014-04-19 DIAGNOSIS — C180 Malignant neoplasm of cecum: Secondary | ICD-10-CM | POA: Diagnosis not present

## 2014-04-19 DIAGNOSIS — I10 Essential (primary) hypertension: Secondary | ICD-10-CM | POA: Diagnosis not present

## 2014-04-19 DIAGNOSIS — K589 Irritable bowel syndrome without diarrhea: Secondary | ICD-10-CM | POA: Diagnosis not present

## 2014-04-19 DIAGNOSIS — Z5111 Encounter for antineoplastic chemotherapy: Secondary | ICD-10-CM | POA: Diagnosis not present

## 2014-04-19 DIAGNOSIS — Z452 Encounter for adjustment and management of vascular access device: Secondary | ICD-10-CM | POA: Diagnosis not present

## 2014-04-19 DIAGNOSIS — K219 Gastro-esophageal reflux disease without esophagitis: Secondary | ICD-10-CM | POA: Diagnosis not present

## 2014-04-19 DIAGNOSIS — C182 Malignant neoplasm of ascending colon: Secondary | ICD-10-CM | POA: Diagnosis not present

## 2014-04-19 DIAGNOSIS — D649 Anemia, unspecified: Secondary | ICD-10-CM | POA: Diagnosis not present

## 2014-04-19 DIAGNOSIS — G629 Polyneuropathy, unspecified: Secondary | ICD-10-CM | POA: Diagnosis not present

## 2014-04-19 DIAGNOSIS — Z79899 Other long term (current) drug therapy: Secondary | ICD-10-CM | POA: Diagnosis not present

## 2014-04-19 LAB — COMPREHENSIVE METABOLIC PANEL
Albumin: 3.7 g/dL (ref 3.4–5.0)
Alkaline Phosphatase: 76 U/L (ref 46–116)
Anion Gap: 5 — ABNORMAL LOW (ref 7–16)
BUN: 13 mg/dL (ref 7–18)
Bilirubin,Total: 0.2 mg/dL (ref 0.2–1.0)
Calcium, Total: 8.3 mg/dL — ABNORMAL LOW (ref 8.5–10.1)
Chloride: 109 mmol/L — ABNORMAL HIGH (ref 98–107)
Co2: 32 mmol/L (ref 21–32)
Creatinine: 0.87 mg/dL (ref 0.60–1.30)
EGFR (African American): >60
EGFR (Non-African Amer.): >60
Glucose: 89 mg/dL (ref 65–99)
Osmolality: 290 (ref 275–301)
Potassium: 3.4 mmol/L — ABNORMAL LOW (ref 3.5–5.1)
SGOT(AST): 14 U/L — ABNORMAL LOW (ref 15–37)
SGPT (ALT): 18 U/L (ref 14–63)
Sodium: 146 mmol/L — ABNORMAL HIGH (ref 136–145)
Total Protein: 7.2 g/dL (ref 6.4–8.2)

## 2014-04-19 LAB — CBC CANCER CENTER
Basophil #: 0 x10 3/mm (ref 0.0–0.1)
Basophil %: 0.7 %
Eosinophil #: 0.1 x10 3/mm (ref 0.0–0.7)
Eosinophil %: 2.2 %
HCT: 28.5 % — ABNORMAL LOW (ref 35.0–47.0)
HGB: 9 g/dL — ABNORMAL LOW (ref 12.0–16.0)
Lymphocyte #: 1.2 x10 3/mm (ref 1.0–3.6)
Lymphocyte %: 39.1 %
MCH: 29.5 pg (ref 26.0–34.0)
MCHC: 31.7 g/dL — ABNORMAL LOW (ref 32.0–36.0)
MCV: 93 fL (ref 80–100)
Monocyte #: 0.3 x10 3/mm (ref 0.2–0.9)
Monocyte %: 8.2 %
Neutrophil #: 1.6 x10 3/mm (ref 1.4–6.5)
Neutrophil %: 49.8 %
Platelet: 184 x10 3/mm (ref 150–440)
RBC: 3.06 10*6/uL — ABNORMAL LOW (ref 3.80–5.20)
RDW: 16.8 % — ABNORMAL HIGH (ref 11.5–14.5)
WBC: 3.2 x10 3/mm — ABNORMAL LOW (ref 3.6–11.0)

## 2014-04-19 LAB — MAGNESIUM: Magnesium: 2.1 mg/dL

## 2014-04-20 LAB — CEA: CEA: 1.9 ng/mL (ref 0.0–4.7)

## 2014-04-21 DIAGNOSIS — K219 Gastro-esophageal reflux disease without esophagitis: Secondary | ICD-10-CM | POA: Diagnosis not present

## 2014-04-21 DIAGNOSIS — C182 Malignant neoplasm of ascending colon: Secondary | ICD-10-CM | POA: Diagnosis not present

## 2014-04-21 DIAGNOSIS — Z452 Encounter for adjustment and management of vascular access device: Secondary | ICD-10-CM | POA: Diagnosis not present

## 2014-04-21 DIAGNOSIS — Z79899 Other long term (current) drug therapy: Secondary | ICD-10-CM | POA: Diagnosis not present

## 2014-04-21 DIAGNOSIS — D649 Anemia, unspecified: Secondary | ICD-10-CM | POA: Diagnosis not present

## 2014-04-21 DIAGNOSIS — Z5111 Encounter for antineoplastic chemotherapy: Secondary | ICD-10-CM | POA: Diagnosis not present

## 2014-04-21 DIAGNOSIS — G629 Polyneuropathy, unspecified: Secondary | ICD-10-CM | POA: Diagnosis not present

## 2014-04-21 DIAGNOSIS — K589 Irritable bowel syndrome without diarrhea: Secondary | ICD-10-CM | POA: Diagnosis not present

## 2014-04-21 DIAGNOSIS — I10 Essential (primary) hypertension: Secondary | ICD-10-CM | POA: Diagnosis not present

## 2014-04-24 ENCOUNTER — Ambulatory Visit: Payer: Self-pay | Admitting: Oncology

## 2014-05-10 DIAGNOSIS — R221 Localized swelling, mass and lump, neck: Secondary | ICD-10-CM | POA: Diagnosis not present

## 2014-05-10 DIAGNOSIS — E890 Postprocedural hypothyroidism: Secondary | ICD-10-CM | POA: Diagnosis not present

## 2014-05-12 ENCOUNTER — Ambulatory Visit: Payer: Self-pay | Admitting: Otolaryngology

## 2014-05-12 DIAGNOSIS — R221 Localized swelling, mass and lump, neck: Secondary | ICD-10-CM | POA: Diagnosis not present

## 2014-05-12 DIAGNOSIS — R911 Solitary pulmonary nodule: Secondary | ICD-10-CM | POA: Diagnosis not present

## 2014-05-15 DIAGNOSIS — Z23 Encounter for immunization: Secondary | ICD-10-CM | POA: Diagnosis not present

## 2014-05-19 DIAGNOSIS — R221 Localized swelling, mass and lump, neck: Secondary | ICD-10-CM | POA: Diagnosis not present

## 2014-05-31 ENCOUNTER — Ambulatory Visit
Admit: 2014-05-31 | Disposition: A | Discharge status: Home / Self Care | Payer: Self-pay | Attending: Oncology | Admitting: Oncology

## 2014-05-31 DIAGNOSIS — K589 Irritable bowel syndrome without diarrhea: Secondary | ICD-10-CM | POA: Diagnosis not present

## 2014-05-31 DIAGNOSIS — C182 Malignant neoplasm of ascending colon: Secondary | ICD-10-CM | POA: Diagnosis not present

## 2014-05-31 DIAGNOSIS — K219 Gastro-esophageal reflux disease without esophagitis: Secondary | ICD-10-CM | POA: Diagnosis not present

## 2014-05-31 DIAGNOSIS — Z452 Encounter for adjustment and management of vascular access device: Secondary | ICD-10-CM | POA: Diagnosis not present

## 2014-05-31 DIAGNOSIS — I10 Essential (primary) hypertension: Secondary | ICD-10-CM | POA: Diagnosis not present

## 2014-05-31 DIAGNOSIS — Z79899 Other long term (current) drug therapy: Secondary | ICD-10-CM | POA: Diagnosis not present

## 2014-06-05 ENCOUNTER — Ambulatory Visit: Payer: Self-pay | Admitting: Otolaryngology

## 2014-06-05 DIAGNOSIS — Q892 Congenital malformations of other endocrine glands: Secondary | ICD-10-CM | POA: Diagnosis not present

## 2014-06-05 DIAGNOSIS — Z01812 Encounter for preprocedural laboratory examination: Secondary | ICD-10-CM | POA: Diagnosis not present

## 2014-06-23 ENCOUNTER — Ambulatory Visit
Admit: 2014-06-23 | Disposition: A | Discharge status: Home / Self Care | Payer: Self-pay | Attending: Oncology | Admitting: Oncology

## 2014-07-11 NOTE — Op Note (Signed)
PATIENT NAME:  Melissa Fitzgerald, Melissa Fitzgerald MR#:  [id number] DATE OF BIRTH:  1945-06-18  DATE OF PROCEDURE:  02/09/2012  PREOPERATIVE DIAGNOSES:  1. Multinodular goiter.  2. Dysphagia.   POSTOPERATIVE DIAGNOSES:    1. Multinodular goiter.  2. Dysphagia.   PROCEDURE PERFORMED: Minimally invasive total thyroidectomy with laryngeal nerve monitoring.   SURGEON: Jerene Bears, M.D.   ASSISTANT: Mahlon Gammon, M.D.  ANESTHESIA: General endotracheal anesthesia.   ESTIMATED BLOOD LOSS: 25 milliliters.  SPECIMENS: Total thyroid marked superior right pole with stitch.  DRAINS/STENT PLACEMENTS: TLS drain and Surgicel.   OPERATIVE FINDINGS: Bilateral recurrent laryngeal nerves were identified and preserved. Bilateral superior and inferior parathyroid glands were identified and preserved. Large multinodular goiter right side dominant nodule.   DESCRIPTION OF PROCEDURE: The patient was identified in holding, benefits and risks of the procedure were discussed and consent was reviewed. The patient was taken to the Operating Room and placed in the supine position. General endotracheal anesthesia with laryngeal nerve monitoring was placed in a normal fashion with a glide laryngoscope. At this time, the patient was prepped and draped in a sterile fashion after injection of 4 mL 0.25% Marcaine with 1:200,000 epinephrine. A previously marked skin crease was used for incision with a 15 blade. Dissection was carried down through the subcutaneous tissues through use of the Bovie electrocautery. The strap muscles were encountered. Median raphe was divided vertically. There is a high riding innominate artery and this was avoided. The straps were divided from the thyroid notch down to the sternal notch. A large multinodular goitrous thyroid gland was encountered. The sternohyoid and sternothyroid muscles were separated from the patient's right hemithyroid. Dissection was carried bluntly laterally until the great vessels  were encountered. At this time, the lateral portion of the superior pole was isolated and then a medial cleft between the trachea and the superior pole of the thyroid cartilage was entered and the right superior thyroid vessels were isolated and pedicled. At this time under direct visualization using a Harmonic scalpel, the right superior thyroid vessels were ligated using a Harmonic scalpel. At this time, the remaining attachments of the right superior thyroid were separated. The superior parathyroid was encountered and protected and its blood supply kept intact. The gland next was mobilized and delivered through the incision site. This pedicled the recurrent laryngeal nerve coming up to the Berry's ligament. The nerve was identified in its normal anatomical position and stimulated robustly. This was traced until its insertion into the patient's larynx and inferiorly until it dove into the deep neck tissues. The remaining attachments of Berry's ligament were separated using a Harmonic scalpel and bipolar electrocautery. The right inferior parathyroid gland was identified at this time and care was taken to avoid injury and to preserve its blood supply. After Berry's ligament was ligated, the remaining attachments of the right hemithyroid to the trachea were separated and the right hemithyroid was placed within the patient's neck and attention was directed to the patient's left thyroid. At this time, the sternothyroid and sternohyoid muscles were separated from the patient's left hemithyroid. Dissection again was carried laterally, superiorly and inferiorly using blunt techniques with Terris elevators. The left superior thyroid pole was extended much more superior than the patient's right. The vessels were isolated laterally and then medially through a cleft of Joel's space between the trachea and the left superior thyroid vessels. The left superior thyroid vessels were pedicled and these were ligated using a  Harmonic scalpel. The remaining attachments of the left  superior thyroid gland were separated from the deep neck musculature and attention was directed to identification of the left recurrent laryngeal nerve. At this time, the superior parathyroid gland was identified and this was protected and blood supply remained intact. The left recurrent laryngeal nerve was noted to be coursing along the edge of the trachea and the tracheoesophageal groove just inferior to the edge of the left thyroid gland. This was robustly stimulated using the nerve stimulator. At this time, the left recurrent laryngeal nerve was traced up until its insertion into the patient's larynx and the remaining attachments of the Berry's ligament and the hemithyroid were separated from the patient's trachea. The left inferior parathyroid gland was identified and this blood supply was preserved with ligation of Berry's ligament. At this time, the remaining attachments of the thyroid were separated from the trachea using a Harmonic scalpel. The patient's wound was copiously irrigated with sterile saline. Meticulous hemostasis was achieved. Surgicel was placed along the cut edge of the thyroid bed after robust stimulation of bilateral recurrent laryngeal nerve was noted. The parathyroid glands were identified and noted to be normal in color and a TLS drain was placed coming out of the patient's left neck. The strap muscles were reapproximated in a single figure-of-eight fashion stitch and the subcutaneous tissues were reapproximated using 3-0 Vicryl stitches. The skin was closed with Dermabond skin adhesive and topped with a Steri-Strip. At this time, care of the patient was transferred to anesthesia.   ____________________________ Jerene Bears, MD ccv:ap D: 02/09/2012 10:21:23 ET T: 02/09/2012 10:38:51 ET JOB#: [id number]  cc: Jerene Bears, MD, <Dictator> Jerene Bears MD ELECTRONICALLY SIGNED 02/09/2012 16:52

## 2014-07-11 NOTE — Op Note (Signed)
PATIENT NAME:  Melissa Fitzgerald, Melissa Fitzgerald MR#:  [id number] DATE OF BIRTH:  1945/08/21  DATE OF PROCEDURE:  02/09/2012  PREOPERATIVE DIAGNOSIS: Postoperative hematoma.  POSTOPERATIVE DIAGNOSIS: Postoperative hematoma.   PROCEDURE PERFORMED: Evacuation of cervical hematoma.   SURGEON: Jerene Bears, M.D.   ANESTHESIA: General endotracheal anesthesia.   ESTIMATED BLOOD LOSS: 100 mL.   IV FLUIDS: Please see anesthesia record.   COMPLICATIONS: None.   DRAINS/STENT PLACEMENTS: JP drain.   SPECIMENS: None.   INDICATION FOR PROCEDURE: The patient is a 69 year old female with history of total thyroidectomy earlier, on 02/09/2012. The patient developed some postoperative emesis and coughing and a cervical hematoma developed. The patient did have some compressive-type symptoms.   OPERATIVE FINDINGS: Moderate size cervical hematoma evacuated from the thyroid bed bilaterally, left greater than the right, and large JP drain and Surgicel was placed.   DESCRIPTION OF PROCEDURE: After the patient was identified in her room, the decision was discussed with the patient and family to proceed to the Operating Room for evacuation of a neck hematoma. The patient was taken to the Operating Room and placed in the supine position. General endotracheal anesthesia was induced with the glide laryngoscope. The patient's anterior neck was prepped and draped in sterile fashion after the patient got preoperative clindamycin. The previously placed Steri-Strip and Dermabond was removed. The subdermal stitches were removed and the strap muscles were divided along the median raphe. At this time, a large hematoma was encountered. The previously placed TLS drain was removed and the hematoma was evacuated from bilateral thyroid beds. There was an area of bleeding on the anterior tracheal wall and this was cauterized with Bovie electrocautery. On the left tracheal sidewall, there was a bleeding capillary vein. This was cauterized with  bipolar electrocautery. Care was taken to avoid injury of the left recurrent laryngeal nerve. At this time, the patient's neck was copiously irrigated with sterile saline. No further episodes of bleeding were encountered. Surgicel was placed along the thyroid wound bed bilaterally. Strap muscles were reapproximated with a single figure-of-eight stitch after placement of large JP suction in the thyroid beds bilaterally coming out the left neck. Skin was closed in multilayered fashion with subdermal interrupted sutures and the skin was closed with Dermabond skin adhesive and tied with a Steri-Strip. The patient was taken to PAC-U at this time where the patient tolerated the procedure well and was in good condition.  ____________________________ Jerene Bears, MD ccv:slb D: 02/09/2012 22:41:18 ET T: 02/10/2012 09:03:38 ET JOB#: [id number]  cc: Jerene Bears, MD, <Dictator> Jerene Bears MD ELECTRONICALLY SIGNED 02/23/2012 16:45

## 2014-07-15 NOTE — Consult Note (Signed)
Chief Complaint:  Subjective/Chief Complaint sen for microcytic anemia and dark stools. denies nausea, mild abdominal discomfort epigastric and ruq.  no emesis, no bm since admission.   VITAL SIGNS/ANCILLARY NOTES: **Vital Signs.:   22-Jun-15 09:33  Vital Signs Type 15 min Post Blood Start Time  Temperature Temperature (F) 97.5  Pulse Pulse 50  Respirations Respirations 20  Systolic BP Systolic BP 884  Diastolic BP (mmHg) Diastolic BP (mmHg) 86  Mean BP 109  Pulse Ox % Pulse Ox % 99  Oxygen Delivery Room Air/ 21 %   Brief Assessment:  Cardiac Regular   Respiratory clear BS   Gastrointestinal details normal Soft  Nondistended  No masses palpable  Bowel sounds normal  No rebound tenderness  mild discomfort right epigastrum   Lab Results: Routine BB:  22-Jun-15 08:41   Crossmatch Unit 1 Issued (Result(s) reported on 12 Sep 2013 at 09:08AM.)  Routine Chem:  19-Jun-15 22:59   Ferritin ([facility])  3 (Result(s) reported on 10 Sep 2013 at 01:46AM.)    23:10   Iron, Serum  15  Iron Saturation 3 (Result(s) reported on 10 Sep 2013 at 01:34AM.)  Routine Hem:  19-Jun-15 22:59   Hemoglobin (CBC)  5.6  21-Jun-15 06:55   Hemoglobin (CBC)  7.1  22-Jun-15 04:20   Hemoglobin (CBC)  7.0 (Result(s) reported on 12 Sep 2013 at 05:23AM.)   Assessment/Plan:  Assessment/Plan:  Assessment 1) anemia, microcytic-IDA. report of dark stools pta, h/o nsaid related pud, regular excedrine use.   Plan 1) egd today, further recs to follow.   Electronic Signatures: Loistine Simas (MD)  (Signed 22-Jun-15 11:44)  Authored: Chief Complaint, VITAL SIGNS/ANCILLARY NOTES, Brief Assessment, Lab Results, Assessment/Plan   Last Updated: 22-Jun-15 11:44 by Loistine Simas (MD)

## 2014-07-15 NOTE — Discharge Summary (Signed)
PATIENT NAME:  Melissa Fitzgerald, Melissa Fitzgerald MR#:  [id number] DATE OF BIRTH:  23-Mar-1946  DATE OF ADMISSION:  09/28/2013 DATE OF DISCHARGE:  10/02/2013  HISTORY:  The patient is a 69 year old female who was found to be anemic and underwent evaluation. Colonoscopy revealed the presence of an ulcerated mass in the cecal area. Biopsy confirmed invasive carcinoma. The patient had required a recent hospitalization for her significant anemia and did receive some blood transfusion. The last reported hemoglobin prior to her surgery was around 8.  The patient was prepared for surgical resection of this colon. The preoperative CEA was elevated at 14 and accordingly a CT scan was performed which showed no apparent evidence of metastatic disease.   Pertinent other history included that of some gastroesophageal reflux, for which she was using Nexium, history of some irritable bowel symptoms in the past; hypertension under control with metoprolol, and hyperthyroidism for which she was on replacement with Synthroid.   COURSE IN THE HOSPITAL: The patient was taken to the operating room on 09/28/2013 and underwent a laparoscopy and right colon resection. The procedure was accomplished without findings of any metastatic disease. The patient did incur blood loss of over 1 liter and because of her chronic anemia this was replaced with 1 unit of blood in the postoperative setting. The patient, however, remained hemodynamically stable.   On the postoperative day the patient's hemoglobin had drifted down to 6.9 and accordingly, a 2nd unit of blood was infused to maintain a hemoglobin over 7 at the least.  The patient, however, showed no evidence of any continued blood loss.  She remained hemodynamically stable.   The patient was started on a liquid diet on the 2nd postoperative day and gradually advanced. She had return of bowel activity prior to discharge.  Her hemoglobin again drifted down and stabilized at 6.9.  It was decided that this  was likely going to improve with iron supplement since her source of blood loss from the GI tract had been dealt with.  With the patient being relatively stable, she was discharged home in stable condition on 10/02/2013 to be followed as an outpatient.    The patient's pathology report showed that this was a T3 lesion and 1 lymph node was involved in making it an N1. She likely will need chemotherapy based on the staging and an oncology consultation will be arranged as an outpatient.   FINAL DIAGNOSES:  1.  Adenocarcinoma of the cecum. 2.  Hypertension.  3.  Gastroesophageal reflux.  4.  Anemia, chronic from gastrointestinal blood loss with acute intraoperative loss.  5.  Hypothyroid state.   OPERATION PERFORMED: Laparoscopy and right hemicolectomy.    ____________________________ S.Robinette Haines, MD sgs:lt D: 10/20/2013 08:43:07 ET T: 10/20/2013 09:15:48 ET JOB#: [id number]  cc: S.G. Jamal Collin, MD, <Dictator> [facility] Robinette Haines MD ELECTRONICALLY SIGNED 10/21/2013 8:35

## 2014-07-15 NOTE — Op Note (Signed)
PATIENT NAME:  Melissa Fitzgerald, Melissa Fitzgerald MR#:  [id number] DATE OF BIRTH:  04/12/1945  DATE OF PROCEDURE:  10/24/2013  PREOPERATIVE DIAGNOSIS: Carcinoma of the cecum.   POSTOPERATIVE DIAGNOSIS: Carcinoma of the cecum.  PROCEDURE: Insertion of venous access port with ultrasound and fluoroscopic guidance.   SURGEON: Mckinley Jewel, MD   ANESTHESIA: Monitored care and local anesthetic containing 0.5% Marcaine and 1% Xylocaine.   COMPLICATIONS: None.   ESTIMATED BLOOD LOSS: Minimal.   DESCRIPTION OF PROCEDURE: The patient was placed in the supine position on the operating room table and with adequate sedation and monitoring placed in slight Trendelenburg. Ultrasound probe was brought up to the field. Timeout was performed. The subclavian vein was identified beneath the lateral end of the clavicle with the ultrasound. A local anesthetic was instilled and a small 1 cm incision made. Through this the needle was positioned into the subclavian vein with free withdrawal of blood and then using the Seldinger technique the catheter was positioned going into the distal superior vena cava. Skin mark was at 25 cm. Fluoroscopy was used to position the catheter going into the superior vena cava. Initially there was a tendency for the catheter to go up into the left IJ, but with fluoroscopic guidance this was successfully positioned going into the superior vena cava. A local anesthetic was instilled over the 2nd costal cartilage area and the intervening space between the port site and the catheter entrance site was infiltrated also. A skin incision was made over the 2nd costal cartilage and a subcutaneous pocket created with cautery. The catheter was tunneled through to this site and then cut to approximate length. It was then affixed to a prefilled port. The port was then placed in the pocket and anchored to the underlying fascia with 3 stitches of 2-0 Prolene. It was then flushed through with heparinized saline. Fluoroscopy was  repeated ensuring no kinks in the catheter and good positioning in the distal SVC. The subcutaneous tissue was closed with 3-0 Vicryl and the skin with subcuticular 4-0 Vicryl covered with Dermabond. The procedure was well tolerated. She was subsequently returned to the recovery room in stable condition.   ____________________________ S.Robinette Haines, MD sgs:sb D: 10/25/2013 08:07:08 ET T: 10/25/2013 09:39:35 ET JOB#: [id number]  cc: S.G. Jamal Collin, MD, <Dictator> [facility] Robinette Haines MD ELECTRONICALLY SIGNED 10/25/2013 13:14

## 2014-07-15 NOTE — Discharge Summary (Signed)
Dates of Admission and Diagnosis:  Date of Admission 10-Sep-2013   Date of Discharge 14-Sep-2013   Admitting Diagnosis Dizziness   Final Diagnosis 1. Ascending colon mass- Biopsy results pending 2. Bile gastritis 3. Acute blood loss anemia with iron deficiency 4. HTN 5. Hypothyroidism 6. Chronic headaches    Chief Complaint/History of Present Illness CHIEF COMPLAINT:  Fatigue.   HISTORY OF PRESENT ILLNESS:  This is a 69 year old African American female with past medical history of gastroesophageal reflux disease, history of gastritis, hypothyroidism, presenting with fatigue.  She describes two week duration of gradually worsening fatigue with associated dyspnea on exertion, lightheadedness as well as intermittent black stools.  She denies any chest pains, palpitations, or syncopal episodes.  She went to her PCP with the above symptoms.  Blood work was performed.  When she returned home she was called by her PCP today to inform her that she had low hemoglobin and should present to the Emergency Department for further work-up and evaluation.  Currently without complaints.   Allergies:  Codeine: Unknown  Oxycontin: Unknown  Penicillin: GI Distress  Thyroid:  21-Jun-15 06:55   Thyroid Stimulating Hormone 0.644 (0.45-4.50 (International Unit)  ----------------------- Pregnant patients have  different reference  ranges for TSH:  - - - - - - - - - -  Pregnant, first trimetser:  0.36 - 2.50 uIU/mL)  Pathology:  22-Jun-15 12:00   Pathology Report ========== TEST NAME ==========  ========= RESULTS =========  = REFERENCE RANGE =  PATHOLOGY REPORT  Pathology Report .                               [   Final Report         ]                   Material submitted:         Marland Kitchen PART A: ANTRUM COLD BIOPSY PART B: BODY OF STOMACH COLD BIOPSY PART C: GEJ COLD BIOPSY .                               [   Final Report         ]                   Pre-operative diagnosis:                                         . ANEMIA .                               [   Final Report         ]                   ********************************************************************** Diagnosis: Part A: ANTRUM COLD BIOPSY: - ANTRAL MUCOSA WITH MILD CHRONIC GASTRITIS AND SUPERFICIAL VASCULAR CONGESTION WITH REACTIVE FOVEOLAR HYPERPLASIA (SEE COMMENT). - NEGATIVE FOR H.PYLORI, DYSPLASIA AND MALIGNANCY. . Part B: BODY OF STOMACH COLD BIOPSY: - OXYNTIC MUCOSA WITH MILD CHRONIC GASTRITIS AND INCREASE IN PARIETAL CELL SIZEWITH APICAL PROTRUSIONS (SEE COMMENT). - NEGATIVE FOR H.PYLORI, DYSPLASIA AND MALIGNANCY. . Part C: GEJ COLD BIOPSY: - SQUAMOUS AND COLUMNAR MUCOSA WITH MODERATE CHRONIC INFLAMMATION. -  NO INTESTINAL METAPLASIA, DYSPLASIA OR MALIGNANCY IDENTIFIED. Marland Kitchen Comment: Findings the in antral mucosa are compatible with healing mucosal injury.  Possible etiologies include drug, chemical injury, infection and other upper GI process. . The changes identified in the oxyntic mucosa are non-specific and may be seen in patients on proton-pump inhibitor therapy, patients with gastric ulcers, morbid obesity, Zollinger-Ellison syndrome, or H. pylori gastritis. Correlation with clinical and endoscopic findings is required. XDB/09/13/2013 ********************************************************************** .                               [   Final Report         ]                   Electronically signed:                                     . Lum Babe, MD, Pathologist .              [   Final Report         ]                   Gross description:                                         . A.  Received in a formalin filled container labeled Branda Chaudhary and antrum cold biopsy is a single piece of pale tan soft tissue measuring 0.5 cm in greatest dimensions.  Entirely submitted in one cassette. . B.  Received in a formalin filled container labeled Shyanna Klingel and body of stomach cold biopsy is a  single piece of pale tan soft tissue measuring 0.5 cm in greatest dimensions.  Entirely submitted in one cassette. . C.  Received in a formalin filled container labeled Lenny Bouchillon and GEJ cold biopsy are two fragments of pale tan soft tissue measuring 0.2 and 0.5 cm in greatest dimensions.  Entirely submitted in one cassette. BMD/ASB .                               [   Final Report         ]                   Pathologist provided ICD-9: 535.50 .                               [   Final Report         ]                   CPT                                   . [id number], [id number], [id number]               [facility]            No: [id number]           [address], [place], [place] [address]  Lindon Romp, MD         [phone]                         [phone] [id number]   Result(s) reported on 13 Sep 2013 at 03:53PM.  24-Jun-15 12:00   Pathology Report ========== TEST NAME ==========  ========= RESULTS =========  = REFERENCE RANGE =  PATHOLOGY REPORT  Pathology Report .                               [   Final Report         ]                   Material submitted:         Marland Kitchen PART A: CECAL MASS COLD BIOPSIES *STAT* PART B: DISTAL SIGMOID POLYP X2 COLD BIOPSY .                               [   Final Report         ]                   Pre-operative diagnosis:                                        . ANEMIA .                               [   Final Report         ]                   ********************************************************************** Diagnosis: Part A: CECAL MASS COLD BIOPSIES *STAT*: - INVASIVE ADENOCARCINOMA, MODERATELY DIFFERENTIATED. . Part B: DISTAL SIGMOID POLYP X2 COLD BIOPSY: - HYPERPLASTIC POLYPS (2). - NEGATIVE FOR DYSPLASIA AND MALIGNANCY. Marland Kitchen COMMENT: Intradepartmental consultation was obtained. The results of this case were discussed with Stephens November, RN in the office of Dr. Gustavo Lah on September 15, 2013 by  Dr. Luana Shu. XDB/09/15/2013 ********************************************************************** .                               [   Final Report         ]                   Electronically signed:                                     . Lum Babe, MD, Pathologist .                               [   Final Report         ]                   Gross description:                                         .  A.  Received in a formalin filled container labeled Liviah Cake and cecum mass cold biopsy are multiple fragments of pale tan soft tissue from 0.1 to 0.3 cm in greatest dimensions.  Entirely submitted in one cassette. . B.  Received in a formalin filled container labeled Loriel Diehl and distal sigmoid polyp cold biopsy x 2 are two pieces of pale tan soft tissue measuring 0.1 and 0.3 cm in greatest dimensions. Entirely submitted in one cassette. BMD/ASB .                               [   Final Report         ]     Pathologist provided ICD-9: 153.4, 211.3 .                               [   Final Report         ]                   CPT                                                        . [id number], [id number]               [facility] [id number]           [address] [place], [place] [address]           Lindon Romp, Tok                                         [phone] [id number]   Result(s) reported on 15 Sep 2013 at 11:48AM.  Routine BB:  22-Jun-15 08:41   Crossmatch Unit 1 Transfused  Result(s) reported on 13 Sep 2013 at 07:04AM.  Routine Hem:  21-Jun-15 06:55   Hemoglobin (CBC)  7.1  WBC (CBC)  3.5  RBC (CBC)  3.19  Hematocrit (CBC)  23.3  Platelet Count (CBC) 357  MCV  73  MCH  22.1  MCHC  30.2  RDW  20.4  Neutrophil % 54.4  Lymphocyte % 30.4  Monocyte % 12.6  Eosinophil % 1.2  Basophil % 1.4  Neutrophil # 1.9  Lymphocyte # 1.1  Monocyte # 0.4  Eosinophil # 0.0  Basophil # 0.0 (Result(s) reported on 11 Sep 2013  at 07:16AM.)  22-Jun-15 04:20   Hemoglobin (CBC)  7.0 (Result(s) reported on 12 Sep 2013 at 05:23AM.)  23-Jun-15 05:17   Hemoglobin (CBC)  8.2  WBC (CBC) 4.2  RBC (CBC)  3.44  Hematocrit (CBC)  25.7  Platelet Count (CBC) 335  MCV  75  MCH  23.8  MCHC  31.9  RDW  20.7  Neutrophil % 57.2  Lymphocyte % 28.9  Monocyte % 11.9  Eosinophil % 1.0  Basophil % 1.0  Neutrophil # 2.4  Lymphocyte # 1.2  Monocyte # 0.5  Eosinophil # 0.0  Basophil # 0.0 (Result(s) reported on 13 Sep 2013 at 05:59AM.)  24-Jun-15 04:55   Hemoglobin (CBC)  8.1 (Result(s) reported on 14 Sep 2013 at 05:32AM.)   Pertinent Past History:  Pertinent Past History HTN, GERD, Hypothyroidism   Hospital Course:  Hospital Course * Acute blood loss anemia over chronic loss. She does have iron deficiency anemia suggesting chronic loss. EGD showed bile gastritis. Colonoscopy showed 2 small polyps and also an ascending colon mass. Biopsy results pending PPI. Iron supplements. Hb stable/  * HTN ON meds. Improved  * Hypothyroidism, synthroid  * h/o PUD, GERD, PPI   * Chronic HA has seen neuro in past No NSAIDs she says improved over past few months   Time spent on discharge day 45 minutes   Condition on Discharge Fair   Code Status:  Code Status Full Code   PHYSICAL EXAM ON DISCHARGE:  Physical Exam:  GEN no acute distress, thin   HEENT pale conjunctivae, hearing intact to voice, moist oral mucosa   NECK supple  No masses   RESP normal resp effort  clear BS   CARD regular rate   ABD denies tenderness  soft  normal BS   VITAL SIGNS:  Vital Signs: **Vital Signs.:   24-Jun-15 13:14  Temperature Temperature (F) 98  Celsius 36.6  Temperature Source oral  Pulse Pulse 66  Respirations Respirations 20  Systolic BP Systolic BP 127  Diastolic BP (mmHg) Diastolic BP (mmHg) 82  Mean BP 97  Pulse Ox % Pulse Ox % 94  Pulse Ox Activity Level  At rest  Oxygen Delivery Room Air/ 21 %   DISCHARGE  INSTRUCTIONS HOME MEDS:  Medication Reconciliation: Patient's Home Medications at Discharge:     Medication Instructions  metoprolol succinate 25 mg oral tablet, extended release  1 tab(s) orally once a day   levothyroxine 88 mcg (0.088 mg) oral capsule  1 cap(s) orally once a day   nexium 40 mg oral delayed release capsule  1 cap(s) orally 2 times a day. For gastritis, GI bleed   ferrous sulfate 325 mg (65 mg elemental iron) oral tablet  1 tab(s) orally 2 times a day (with meals)   ultram 50 mg oral tablet  1 tab(s) orally 3 times a day, As Needed for Headache   docusate-senna 50 mg-8.6 mg oral tablet  2 tab(s) orally 2 times a day     Physician's Instructions:  Diet Low Sodium   Activity Limitations As tolerated   Return to Work Not Applicable   Time frame for Follow Up Appointment 1-2 weeks  Dr. Gustavo Lah within a week   Time frame for Follow Up Appointment 1-2 weeks  Dr. Jeananne Rama within a week   Other Comments Your biopsy results from colonoscopy are pending. It is very important that you f/u with your doctors for this result.   Electronic Signatures: Alba Destine (MD)  (Signed 25-Jun-15 16:35)  Authored: ADMISSION DATE AND DIAGNOSIS, CHIEF COMPLAINT/HPI, Allergies, PERTINENT LABS, PERTINENT PAST HISTORY, HOSPITAL COURSE, PHYSICAL EXAM ON DISCHARGE, VITAL SIGNS, DISCHARGE INSTRUCTIONS HOME MEDS, PATIENT INSTRUCTIONS   Last Updated: 25-Jun-15 16:35 by Alba Destine (MD)

## 2014-07-15 NOTE — Consult Note (Signed)
Chief Complaint:  Subjective/Chief Complaint seen for anemia and black stools.  denies abdominal pain or nausea, tolerating clears.   VITAL SIGNS/ANCILLARY NOTES: **Vital Signs.:   21-Jun-15 04:43  Vital Signs Type Routine  Temperature Temperature (F) 97.8  Celsius 36.5  Pulse Pulse 68  Respirations Respirations 20  Systolic BP Systolic BP 615  Diastolic BP (mmHg) Diastolic BP (mmHg) 79  Mean BP 97  Pulse Ox % Pulse Ox % 95  Oxygen Delivery Room Air/ 21 %   Brief Assessment:  Cardiac Regular   Respiratory clear BS   Gastrointestinal details normal Soft  Nontender  Nondistended  No masses palpable  Bowel sounds normal   Lab Results: Thyroid:  21-Jun-15 06:55   Thyroid Stimulating Hormone 0.644 (0.45-4.50 (International Unit)  ----------------------- Pregnant patients have  different reference  ranges for TSH:  - - - - - - - - - -  Pregnant, first trimetser:  0.36 - 2.50 uIU/mL)  Routine Hem:  19-Jun-15 22:59   Hemoglobin (CBC)  5.6  Platelet Count (CBC) 434  21-Jun-15 06:55   WBC (CBC)  3.5  RBC (CBC)  3.19  Hemoglobin (CBC)  7.1  Hematocrit (CBC)  23.3  Platelet Count (CBC) 357  MCV  73  MCH  22.1  MCHC  30.2  RDW  20.4  Neutrophil % 54.4  Lymphocyte % 30.4  Monocyte % 12.6  Eosinophil % 1.2  Basophil % 1.4  Neutrophil # 1.9  Lymphocyte # 1.1  Monocyte # 0.4  Eosinophil # 0.0  Basophil # 0.0 (Result(s) reported on 11 Sep 2013 at 07:16AM.)   Assessment/Plan:  Assessment/Plan:  Assessment 1) microcytic anemia in the setting of frequent asa use and dark stools.  H/o PUDz likely secondary to [facility] powders in the past. Epigastric pain improved.   Plan 1) EGD tomorrow.  I have discussed the risks benefits and complicatiosn of egd to include not limited to bleeding infection perforationa nd sedation and she wishes to proceed.  continue ppi as current.   Electronic Signatures: Loistine Simas (MD)  (Signed 21-Jun-15 15:06)  Authored: Chief Complaint,  VITAL SIGNS/ANCILLARY NOTES, Brief Assessment, Lab Results, Assessment/Plan   Last Updated: 21-Jun-15 15:06 by Loistine Simas (MD)

## 2014-07-15 NOTE — Consult Note (Signed)
PATIENT NAME:  Melissa Fitzgerald, Melissa Fitzgerald MR#:  [id number] DATE OF BIRTH:  Sep 30, 1945  DATE OF CONSULTATION:  09/10/2013  REFERRING PHYSICIAN:   Dr. Lavetta Nielsen CONSULTING PHYSICIAN:  Lollie Sails, MD  REASON FOR CONSULTATION: Microcytic anemia, reported black stools.   HISTORY OF PRESENT ILLNESS: [name] is a pleasant 69 year old African American female who states that she came to the Emergency Room after being told of by her primary physician, after a blood test, that she was anemic and she needed to come to the hospital. She states that she has been feeling very fatigued for the past 2-3 weeks with some insomnia, leg cramps, as well as distal arm numbness and dizziness. She did undergo fecal occult blood testing in the Emergency Room; however, this was negative. There is report of black stools. She does take Nexium on a daily basis and has for some time. 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 She thought she was doing okay when she got off the [facility] powders she was previously taking; however, she has been taking Excedrin on a regular basis. She denies any nausea, vomiting, or abdominal pain. There is no heartburn or dysphagia. She does have problems with constipation, bowel movements perhaps twice a week. She does take stool softeners. She has been having some right-sided abdominal pain. To this physician, she denied any black stools, blood in the stools, or slimy stools. In hospital records, she had an EGD for abdominal pain in February 2003, this showing duodenitis, as well as a gastric ulcer. She had a colonoscopy done in September 2013. This showed some internal hemorrhoids without other abnormality. She has been told on more recent EGDs as an outpatient that she had irritation in her stomach.   GASTROINTESTINAL FAMILY HISTORY: Pertinent for father with peptic ulcer disease. Negative for colorectal cancer to her knowledge. No liver disease to her knowledge.   PAST MEDICAL HISTORY:  1.   Hypertension.  2.  Gastroesophageal reflux.  3.  Peptic ulcer disease as above.  4.  History of gastritis.  5.  Hypothyroidism.   SOCIAL HISTORY: The patient does smoke, as well as occasional alcohol use.   OUTPATIENT MEDICATIONS: Levothyroxine 88 mcg daily, metoprolol succinate 25 mg once a day, and Nexium 40 mg once a day.   REVIEW OF SYSTEMS: 10 systems reviewed per admission history and physical, agree with same.   PHYSICAL EXAMINATION:  VITAL SIGNS: Temperature is 96.9, pulse 78, respirations 18, blood pressure 166/88, pulse oximetry 99%.  GENERAL: She is a 69 year old African American female no acute distress.  HEENT: Normocephalic, atraumatic.  Eyes: Anicteric.  Nose: Septum midline. No lesions.  Oropharynx: No lesions.  NECK: Supple. No JVD.  HEART: Regular rate and rhythm.  LUNGS: Clear.  ABDOMEN: Soft, nontender, nondistended. Bowel sounds positive, normoactive.  RECTAL: Anorectal exam deferred.  EXTREMITIES: No clubbing, cyanosis, or edema.  NEUROLOGICAL: Cranial nerves II-XII grossly intact. Muscle strength bilaterally equal and symmetric, 5/5. DTR bilaterally equal and symmetric.   LABORATORY, DIAGNOSTIC, AND RADIOLOGICAL DATA: On admission to the hospital very late last night, she had glucose 96, BUN 16, creatinine 0.85, sodium 143, potassium 3.8, chloride 109, bicarbonate 27. She had a serum iron of 15, TIBC of 489 elevated, iron saturation 3, ferritin low at 2, troponin I x 1 negative at less than 0.02. Hemogram showing a white count of 4.0, hemoglobin and hematocrit 5.6/18.8, platelet count 434,000, MCV was 70. She is currently being transfused. Urinalysis was negative. There has been no  imaging.   ASSESSMENT:  Microcytic anemia in the setting of prior history of peptic ulcer disease and ongoing aspirin use. The patient apparently has had problems with gastritis for many years in the setting of her aspirin and nonsteroidal antiinflammatory drug use. Note  esophagogastroduodenoscopy and colonoscopy results as above.   RECOMMENDATION:  1.  Continue transfusion and b.i.d. PPI as you are.  2.  We will proceed with EGD on Monday. I have discussed the risks, benefits, and complications of this procedure to include, but not limited to bleeding, infection, perforation, and the risk of sedation, and she wishes to proceed. She did have a colonoscopy within the past couple of years. It may be necessary to repeat that study, as well as a capsule endoscopy due to her microcytic anemia, if the EGD is uninformative.     ____________________________ Lollie Sails, MD mus:ts D: 09/10/2013 17:56:39 ET T: 09/10/2013 18:18:11 ET JOB#: [id number]  cc: Lollie Sails, MD, <Dictator> Lollie Sails MD ELECTRONICALLY SIGNED 09/15/2013 9:26

## 2014-07-15 NOTE — H&P (Signed)
PATIENT NAME:  Melissa Fitzgerald, Melissa Fitzgerald MR#:  [id number] DATE OF BIRTH:  1945-11-28  DATE OF ADMISSION:  09/10/2013  REFERRING PHYSICIAN:  Dr. Cheri Guppy.    PRIMARY CARE PHYSICIAN:  Dr. Jeananne Rama.   CHIEF COMPLAINT:  Fatigue.   HISTORY OF PRESENT ILLNESS:  This is a 69 year old African American female with past medical history of gastroesophageal reflux disease, history of gastritis, hypothyroidism, presenting with fatigue.  She describes two week duration of gradually worsening fatigue with associated dyspnea on exertion, lightheadedness as well as intermittent black stools.  She denies any chest pains, palpitations, or syncopal episodes.  She went to her PCP with the above symptoms.  Blood work was performed.  When she returned home she was called by her PCP today to inform her that she had low hemoglobin and should present to the Emergency Department for further work-up and evaluation.  Currently without complaints.   REVIEW OF SYSTEMS:  CONSTITUTIONAL:  Positive for fatigue, weakness.  Denies fevers or chills.  EYES:  Denies blurred vision, double vision, eye pain.  EARS, NOSE, THROAT:  Denies tinnitus, ear pain, hearing loss.  RESPIRATORY:  Denies cough, wheeze, shortness of breath, other than dyspnea on exertion as described above.  CARDIOVASCULAR:  Denies chest pain, palpitations, or edema.  GASTROINTESTINAL:  Denies any nausea, vomiting, diarrhea, abdominal pain.  Positive for intermittent melena.  GENITOURINARY:  Denies dysuria, hematuria.  ENDOCRINE:  Denies nocturia or thyroid problems.  HEMATOLOGY AND LYMPHATIC:  Denies easy bruising or bleeding.  SKIN:  Denies rashes or lesions.  MUSCULOSKELETAL:  Denies pain in neck, back, shoulder, knees, hips or arthritic symptoms.  NEUROLOGIC:  Denies paralysis or paresthesia.  PSYCHIATRIC:  Denies anxiety or depressive symptoms.  Otherwise, full review of systems performed by me is negative.   PAST MEDICAL HISTORY:  Hypertension, gastroesophageal reflux  disease, history of gastritis as well as hypothyroidism.   SOCIAL HISTORY:  Positive for tobacco use as well as occasional alcohol use.   FAMILY HISTORY:  Positive for coronary artery disease as well as diabetes.   ALLERGIES:  CODEINE, OXYCONTIN AND PENICILLIN.   HOME MEDICATIONS:  Metoprolol 25 mg by mouth daily, Nexium 40 mg by mouth daily, Synthroid 88 mcg by mouth daily.   PHYSICAL EXAMINATION: VITAL SIGNS:  Temperature 98.3, heart rate 90, respirations 20, blood pressure 136/76, saturating 100% on room air.  Weight 78.5 kg, BMI 27.9.  GENERAL:  Well-nourished, well-developed, African American female, currently in no acute distress.  HEAD:  Normocephalic, atraumatic.  EYES:  Pupils equal, round, reactive to light.  Extraocular muscles intact.  No scleral icterus, however pale conjunctivae.  MOUTH:  Moist mucosal membranes.  Dentition intact.  No abscess noted.  EAR, NOSE, THROAT:  Clear without exudates.  No external lesions.  NECK:  Supple.  No thyromegaly.  No nodules.  No JVD.  PULMONARY:  Clear to auscultation bilaterally without wheeze, rales, rhonchi.  No use excision muscles.  Good respiratory effort.  CHEST:  Nontender to palpation.  CARDIOVASCULAR:  S1, S2, regular rate and rhythm.  No murmurs, rubs, or gallops.  No edema.  Pedal pulses 2+ bilaterally.  GASTROINTESTINAL:  Soft, nontender, nondistended.  No masses.  Positive bowel sounds.  No hepatosplenomegaly.  Per ER staff fecal occult negative, however poor stool sample.  MUSCULOSKELETAL:  No swelling, clubbing, or edema.  Range of motion full in all extremities.  NEUROLOGIC:  Cranial nerves II through XII intact.  No gross focal neurological deficits.  Sensation intact.  Reflexes intact.  SKIN:  No ulceration, lesions, rash, cyanosis.  Skin warm, dry.  Turgor intact.  PSYCHIATRIC:  Mood and affect within normal limits.  The patient is awake, alert, oriented x 3.  Insight and judgment intact.   LABORATORY DATA:  Sodium 143,  potassium 3.8, chloride 109, bicarb 27, BUN 16, creatinine 0.85, glucose 96.  Troponin less than 0.02.  WBC 4, hemoglobin 5.6, MCV of 70, RDW of 18.2, platelets of 434.  Urinalysis negative for evidence of infection.  Ferritin of 3, TIBC elevated at 489.   ASSESSMENT AND PLAN:  A 69 year old female with history of gastroesophageal reflux disease, gastritis, presenting with fatigue, found to have anemia.  1.  Symptomatic microcytic anemia.  Type and cross now as it has yet to be performed by the Emergency Room staff.  We will transfuse 2 units packed red blood cells.  Questionable etiology as fecal occult blood test was negative, however given history we will consult gastroenterology as she may require endoscopy.  2.  Gastroesophageal reflux disease.  Continue with proton pump inhibitor therapy.  3.  Hypothyroidism.  Continue Synthroid.  4.  Venous thromboembolism prophylaxis with sequential compression devices given anemia.  5.  CODE STATUS:  THE PATIENT IS A FULL CODE.   TIME SPENT:  45 minutes.    ____________________________ Aaron Mose. Janny Crute, MD dkh:ea D: 09/10/2013 02:37:08 ET T: 09/10/2013 03:07:06 ET JOB#: [id number]  cc: Aaron Mose. Yolanda Dockendorf, MD, <Dictator> Naquita Nappier Woodfin Ganja MD ELECTRONICALLY SIGNED 09/10/2013 20:20

## 2014-07-15 NOTE — Consult Note (Signed)
Chief Complaint:  Subjective/Chief Complaint please see EGD report.  Patient with mild to moderate bile gastritis, no active ulceration.  Recommend prep for colonoscopy for tomorrow.  May need capsule study as outpatient.  Continue po ppi, avoid asa/nsaids.   Electronic Signatures: Loistine Simas (MD)  (Signed 22-Jun-15 12:33)  Authored: Chief Complaint   Last Updated: 22-Jun-15 12:33 by Loistine Simas (MD)

## 2014-07-15 NOTE — Consult Note (Signed)
Brief Consult Note: Diagnosis: microcytic anemia in the setting of asa use and history of PUDz.   Patient was seen by consultant.   Consult note dictated.   Recommend to proceed with surgery or procedure.   Comments: Please see full GI consult [phone].  Paitent admitted with marked anemia and microcytosis. No evidence of acute/ongoing GI bleed, although there may have been some dark stools recently and ongoing asa use/abuse (daily excedrines for ha). EGD for monday.  I have discussed the risks beneftis and complicatiosn of egd to include not limited to bleeding infection perforation and sedation and she wishes to proceed.  Electronic Signatures: Loistine Simas (MD)  (Signed 20-Jun-15 17:59)  Authored: Brief Consult Note   Last Updated: 20-Jun-15 17:59 by Loistine Simas (MD)

## 2014-07-15 NOTE — Op Note (Signed)
PATIENT NAME:  Melissa Fitzgerald, Melissa Fitzgerald MR#:  [id number] DATE OF BIRTH:  [birth year]  DATE OF PROCEDURE:  09/28/2013  PREOPERATIVE DIAGNOSIS: Carcinoma of the cecum.  POSTOPERATIVE DIAGNOSES: Carcinoma of the cecum, significant adhesions.  PROCEDURES PERFORMED: Laparoscopy, lysis of adhesions and right hemicolectomy.   SURGEON: Mckinley Jewel, M.D.   ANESTHESIA: General.   COMPLICATIONS: None.   ESTIMATED BLOOD LOSS: Approximately 1200 mL replaced with crystalloids alone, but the patient due to get 1 unit of blood.  DESCRIPTION OF PROCEDURE: The patient was placed in the supine position on the operating table with the bean bag. She was put to sleep and the abdomen was prepped and draped out as a sterile field, after Foley catheter was inserted. Timeout was performed. Port sites were planned at the umbilicus, epigastrium and left upper quadrant. Initial entry was at the umbilicus where a Veress needle was used to gain access to the peritoneal cavity, verified with the hanging drop method. A 10 mm port was placed and the camera was introduced after pneumoperitoneum was obtained. It was apparent the patient had moderate adhesions from her previous cholecystectomy and pelvic surgery. This seemed to involve the right upper quadrant, against the liver, all along the lateral gutter of the right colon, down to the cecum and terminal ileum in the upper pelvis. The additional ports were placed, both 11 mm ports, and with the use of Harmonic device the adhesions were taken down to free the liver from the transverse colon and the hepatic flexure, and this was done very carefully to minimize injury to any other structures. Dissection was continued until the transverse colon and hepatic flexure was adequately mobilized to allow for visualization of the duodenum, in the posterior aspect. Following this dissection was performed along the lateral gutter. The adhesions were first taken down and then the peritoneum was incised,  also with the Harmonic scalpel, and gradually dissected and the right colon was mobilized towards the midline. Much of the dissection was centered around the pelvic area where the cecum and the terminal ileum were tethered down to the pelvic region. These adhesions were carefully taken down. A total of 50 minutes to 60 minutes was spent in the lysis of adhesions. After the mobilization was completed and the adhesions completely lysed, the right colon was noted to be easily mobilized towards the midline. At this point, the ports were removed and pneumoperitoneum was released. The incision near the umbilicus was extended as a vertical laparotomy incision, about 6 to 7 cm in size. This was carried around the umbilicus. It was deepened through the layers into the peritoneal cavity. A wound protector was then placed. The mobilized right colon was brought up and palpation showed that there was a hard 2.5 cm mass just above the ileocecal valve and the cecum. There was no apparent metastatic disease noted. There were a few small nodes palpable in the mesentery. About 4 inches proximal to the ileocecal valve, the mesentery was freed from the terminal ileum. It was then scored down towards the root of the mesentery and then up towards the proximal transverse colon. The mesentery was then taken down carefully using the Harmonic device, but primarily using ligatures of 2-0 silk as the patient had multiple sizable vessels and tend to bleed very easily. One bleeding vessel was encountered in the transverse mesocolon which was fairly active. Much of the blood loss occurred from this, but this was satisfactorily controlled with a clamp and then ligated with 2-0 silk. After the  mesentery was completely freed, the terminal ileum and the transverse colon were brought together and held in place with 3-0 silk sutures. A small opening was made on the ileum and the transverse colon and the GIA was then utilized to perform a side-to-side  anastomosis. This was completed without any difficulty. A second application of the GIA was used to transect the bowel and close the remaining opening. The staple line was reinforced in the corners and in the middle with 3-0 silk stitches. The resected right colon was then opened to reveal a 2.5 cm ulcerated mass about the ileocecal valve. It was then sent in formalin to pathology. Gloves and gowns were changed along with all the instruments. At this point, all instrument counts were reported correct x2 before the new set of instruments were brought up to the field. The area around the opening was covered with sterile towels and closure obtained. The mesenteric opening in the bowel was closed with a running 3-0 Vicryl. The bowel was placed back in its position. The wound protector was removed. Abdomen was irrigated with 500 mL of fluid and suctioned out. No active bleeding was noted. Palpation of the liver again showed no palpable masses. The peritoneal layer was closed with a running 2-0 Vicryl stitch. The fascia was closed with interrupted figure-of-eight stitches of 0 Prolene. Subcutaneous tissue was closed with 3-0 Vicryl and the skin approximated with staples. The port sites were also closed with staples. A dry sterile dressing was placed. The patient subsequently was extubated in PAC-U since she had some trouble waking up quickly.   ____________________________ S.Robinette Haines, MD sgs:sb D: 09/29/2013 06:23:19 ET T: 09/29/2013 08:10:41 ET JOB#: [id number]  cc: Synthia Innocent. Jamal Collin, MD, <Dictator> [facility] Robinette Haines MD ELECTRONICALLY SIGNED 09/29/2013 10:47

## 2014-07-15 NOTE — Consult Note (Signed)
Chief Complaint:  Subjective/Chief Complaint seen for anemia, microcytic.  tolerated prep for colonoscopy.  no abdominalpain or nausea.   VITAL SIGNS/ANCILLARY NOTES: **Vital Signs.:   23-Jun-15 14:46  Vital Signs Type Routine  Temperature Temperature (F) 98.4  Celsius 36.8  Pulse Pulse 57  Respirations Respirations 20  Systolic BP Systolic BP 383  Diastolic BP (mmHg) Diastolic BP (mmHg) 86  Mean BP 101  Pulse Ox % Pulse Ox % 98  Pulse Ox Activity Level  At rest  Oxygen Delivery Room Air/ 21 %   Brief Assessment:  Cardiac Regular   Respiratory clear BS   Gastrointestinal details normal Soft  Nontender  Nondistended  No masses palpable   Lab Results: Routine Hem:  19-Jun-15 22:59   Hemoglobin (CBC)  5.6  21-Jun-15 06:55   Hemoglobin (CBC)  7.1  22-Jun-15 04:20   Hemoglobin (CBC)  7.0 (Result(s) reported on 12 Sep 2013 at 05:23AM.)  23-Jun-15 05:17   WBC (CBC) 4.2  RBC (CBC)  3.44  Hemoglobin (CBC)  8.2  Hematocrit (CBC)  25.7  Platelet Count (CBC) 335  MCV  75  MCH  23.8  MCHC  31.9  RDW  20.7  Neutrophil % 57.2  Lymphocyte % 28.9  Monocyte % 11.9  Eosinophil % 1.0  Basophil % 1.0  Neutrophil # 2.4  Lymphocyte # 1.2  Monocyte # 0.5  Eosinophil # 0.0  Basophil # 0.0 (Result(s) reported on 13 Sep 2013 at 05:59AM.)   Assessment/Plan:  Assessment/Plan:  Assessment 1) microcytic anemia, IDA.  EGD showing erosive gastritis.  colonoscopy today.  I have discussed the risks benefits and complications of proceedure to include not limited to bleeding infection perforationa dn sedation and she wishes to proceed.  further recs to follow.  2) headaches, ASA overuse.   Plan as above.   Electronic Signatures: Loistine Simas (MD)  (Signed 23-Jun-15 17:18)  Authored: Chief Complaint, VITAL SIGNS/ANCILLARY NOTES, Brief Assessment, Lab Results, Assessment/Plan   Last Updated: 23-Jun-15 17:18 by Loistine Simas (MD)

## 2014-07-15 NOTE — Consult Note (Signed)
Chief Complaint:  Subjective/Chief Complaint Please see full colonoscopy report.  Mass in proximal ascending colon across from the IC valve, noted to have stigmata of recent bleeding.  Multiple biopsies taken.   Electronic Signatures: Loistine Simas (MD)  (Signed 23-Jun-15 18:06)  Authored: Chief Complaint   Last Updated: 23-Jun-15 18:06 by Loistine Simas (MD)

## 2014-07-16 DIAGNOSIS — S61402A Unspecified open wound of left hand, initial encounter: Secondary | ICD-10-CM | POA: Diagnosis not present

## 2014-07-20 ENCOUNTER — Ambulatory Visit
Admit: 2014-07-20 | Disposition: A | Discharge status: Home / Self Care | Payer: Self-pay | Attending: Otolaryngology | Admitting: Otolaryngology

## 2014-07-20 DIAGNOSIS — I10 Essential (primary) hypertension: Secondary | ICD-10-CM | POA: Diagnosis not present

## 2014-07-20 DIAGNOSIS — E042 Nontoxic multinodular goiter: Secondary | ICD-10-CM | POA: Diagnosis not present

## 2014-07-20 DIAGNOSIS — Z85038 Personal history of other malignant neoplasm of large intestine: Secondary | ICD-10-CM | POA: Diagnosis not present

## 2014-07-20 DIAGNOSIS — Q892 Congenital malformations of other endocrine glands: Secondary | ICD-10-CM | POA: Diagnosis not present

## 2014-07-20 DIAGNOSIS — E040 Nontoxic diffuse goiter: Secondary | ICD-10-CM | POA: Diagnosis not present

## 2014-07-20 DIAGNOSIS — Z79899 Other long term (current) drug therapy: Secondary | ICD-10-CM | POA: Diagnosis not present

## 2014-07-20 DIAGNOSIS — Z8711 Personal history of peptic ulcer disease: Secondary | ICD-10-CM | POA: Diagnosis not present

## 2014-07-20 DIAGNOSIS — R600 Localized edema: Secondary | ICD-10-CM | POA: Diagnosis not present

## 2014-07-20 DIAGNOSIS — M1990 Unspecified osteoarthritis, unspecified site: Secondary | ICD-10-CM | POA: Diagnosis not present

## 2014-07-20 DIAGNOSIS — B1920 Unspecified viral hepatitis C without hepatic coma: Secondary | ICD-10-CM | POA: Diagnosis not present

## 2014-07-20 DIAGNOSIS — R221 Localized swelling, mass and lump, neck: Secondary | ICD-10-CM | POA: Diagnosis not present

## 2014-07-21 LAB — SURGICAL PATHOLOGY

## 2014-07-23 NOTE — Op Note (Addendum)
PATIENT NAME:  Melissa Fitzgerald, Melissa Fitzgerald MR#:  [id number] DATE OF BIRTH:  08/12/1945  DATE OF PROCEDURE:  07/20/2014.  PREOPERATIVE DIAGNOSIS:   A midline neck mass with ultrasound consistent with residual thyroid tissue.   POSTPROCEDURE DIAGNOSIS:  A midline neck mass with ultrasound consistent with residual thyroid tissue.   PROCEDURE PERFORMED:  Excision of a midline neck mass consistent with pyramidal  thyroid tissue.   SURGEON:  Carloyn Manner, MD.   ANESTHESIA:  General endotracheal anesthesia.   ESTIMATED BLOOD LOSS:  25 mL.   IV FLUIDS:  Please see anesthesia record.   COMPLICATIONS:  None.   DRAINS AND STENT PLACEMENTS:  SurgiSeal.     SPECIMENS:  Midline neck mass.   INDICATIONS FOR PROCEDURE:  The patient is a 69 year old female status post total thyroidectomy with history of colon cancer who several years after thyroidectomy began to have gradual growth of a midline neck mass.  On ultrasound, this was consistent with a hypervascular solid area with no visible tract to the hyoid bone consistent with remnant thyroid tissue regrowth.   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   DESCRIPTION OF PROCEDURE:  The patient was identified in holding, the benefits and risks of the procedure were discussed, and consent was reviewed.  The patient was marked in normal fashion.  The patient was taken to the operating room and placed in the supine position. General endotracheal anesthesia was induced.  The previously marked anterior neck crease was injected with 4 mL of 1% lidocaine with 1:100,000 epinephrine.  After the patient was prepped and draped in a sterile fashion, a 15  blade scalpel was used to make a horizontal neck incision along the previously marked neck crease.  Dissection was carried down through the platysma along the median raphe.  This demonstrated some scar tissue from her prior thyroidectomy.  The median raphe was opened and anterior to the thyroid cartilage was a well-encapsulated hypervascular mass.  There was some arterial and venous blood supply coming inferiorly but no superior or posterior attachments were identified.  This was carefully circumferentially dissected and hemostasis was achieved with a Harmonic Scalpel and bipolar electrocautery.  This mass stayed at midline and was consistent with a residual pyramidal lobe.  Meticulous dissection was taken superior and posterior to this mass to ensure that there was no tract and that this was not a thyroglossal duct cyst presenting after a thyroidectomy.  No tract or connection to the hyoid bone was identified and this area was well encapsulated; therefore a Sistrunk procedure was decided not to be done.  At this time, the midline neck mass, once it had been circumferentially dissected and no further attachments were identified, was passed off the table for permanent pathological evaluation.  The wound was copiously irrigated with sterile saline.  The vascular structure supplying this which was coming inferior and lateral was tied off with 2-0 silk and then SurgiSeal was placed along the wound bed and then the strap muscles reapproximated with 4-0 Vicryl in an interrupted fashion and then the skin was reapproximated with 4-0 Vicryl in an interrupted fashion and skin was closed with Dermabond skin adhesive.  The patient tolerated the procedure well and  was taken to PACU in good condition.   ____________________________ Jerene Bears, MD ccv:kc D: 07/20/2014 10:40:56 ET T: 07/20/2014 15:58:31 ET JOB#: [id number]  cc: Jerene Bears, MD, <Dictator> Jerene Bears MD ELECTRONICALLY SIGNED  08/11/2014 11:45

## 2014-08-25 ENCOUNTER — Telehealth: Payer: Self-pay | Admitting: Family Medicine

## 2014-08-25 MED ORDER — TRAZODONE HCL 50 MG PO TABS: 50.0000 mg | ORAL_TABLET | Freq: Every evening | ORAL | Status: DC | PRN

## 2014-08-25 NOTE — Telephone Encounter (Signed)
Pt is requesting a refill for Rx Zolpidem and Trazodone.  Pt state [facility] has not rec'd either Rx.  [place].  CB#[phone]

## 2014-08-25 NOTE — Telephone Encounter (Signed)
Advise patient prescriptions will have to be printed and picked up here for the Zolpidem because it is a controlled medication. Will send Trazodone to the pharmacy electronically.

## 2014-08-28 NOTE — Telephone Encounter (Signed)
Attempted to contact patient. No answer nor voicemail. NW

## 2014-09-12 ENCOUNTER — Encounter: Payer: Self-pay | Admitting: General Surgery

## 2014-09-12 ENCOUNTER — Ambulatory Visit (INDEPENDENT_AMBULATORY_CARE_PROVIDER_SITE_OTHER): Payer: Commercial Managed Care - HMO | Admitting: General Surgery

## 2014-09-12 VITALS — BP 138/78 | HR 78 | Resp 14 | Ht 66.0 in | Wt 170.0 lb

## 2014-09-12 DIAGNOSIS — Z85038 Personal history of other malignant neoplasm of large intestine: Secondary | ICD-10-CM

## 2014-09-12 MED ORDER — POLYETHYLENE GLYCOL 3350 17 GM/SCOOP PO POWD: 1.0000 | Freq: Once | ORAL | Status: DC

## 2014-09-12 NOTE — Progress Notes (Signed)
Patient ID: Melissa Fitzgerald, female   DOB: 08-Sep-1945, 69 y.o.   MRN: [id number]  Chief Complaint  Patient presents with  . Colon Cancer  . Colonoscopy    HPI Melissa Fitzgerald is a 69 y.o. female here today for colon cancer follow up. Patient states she is not having any GI problems at this time. Having some "burning" discomfort at port site for about 1 month.  Completed chemotherapy in early part of this year.  HPI  Past Medical History  Diagnosis Date  . Irritable bowel disease   . Colon polyp   . Anemia   . Thyroid disease   . Hyperlipidemia   . Kidney stone     Past Surgical History  Procedure Laterality Date  . Colonoscopy  09-13-13    Dr [name]  . Tonsillectomy    . Kidney stone surgery    . Thyroid surgery    . Foot surgery    . Nasal sinus surgery    . Abdominal hysterectomy    . Cholecystectomy  2000  . Portacath placement  10/24/13  . Breast enhancement surgery      Family History  Problem Relation Age of Onset  . Colon cancer      first cousin  . Breast cancer Maternal Aunt   . Breast cancer Paternal Aunt   . Heart disease Mother     Social History History  Substance Use Topics  . Smoking status: Current Some Day Smoker -- 0.25 packs/day    Types: Cigarettes  . Smokeless tobacco: Not on file  . Alcohol Use: Yes     Comment: occasionally    Allergies  Allergen Reactions  . Codeine Nausea And Vomiting  . Oxycodone Nausea And Vomiting  . Penicillins Nausea And Vomiting    Current Outpatient Prescriptions  Medication Sig Dispense Refill  . esomeprazole (NEXIUM) 40 MG capsule Take 40 mg by mouth daily at 12 noon.    Marland Kitchen levothyroxine (SYNTHROID, LEVOTHROID) 100 MCG tablet Take 100 mcg by mouth daily before breakfast.    . metoprolol succinate (TOPROL-XL) 25 MG 24 hr tablet Take 25 mg by mouth daily.    . polyethylene glycol powder (GLYCOLAX/MIRALAX) powder Take 255 g by mouth once. 255 g 0   No current facility-administered medications for this  visit.    Review of Systems Review of Systems  Constitutional: Negative.   Respiratory: Negative.   Cardiovascular: Negative.   Gastrointestinal: Negative.     Blood pressure 138/78, pulse 78, resp. rate 14, height 5\' 6"  (1.676 m), weight 170 lb (77.111 kg).  Physical Exam Physical Exam  Constitutional: She is oriented to person, place, and time. She appears well-developed and well-nourished.  Eyes: Conjunctivae are normal. No scleral icterus.  Neck: Neck supple.  Cardiovascular: Normal rate, regular rhythm and normal heart sounds.   Pulmonary/Chest: Effort normal and breath sounds normal.  Abdominal: Soft. Bowel sounds are normal. There is no hepatomegaly. There is no tenderness. No hernia.    Lymphadenopathy:    She has no cervical adenopathy.  Neurological: She is alert and oriented to person, place, and time.  Skin: Skin is warm and dry.    Data Reviewed Prior notes  Assessment    1 year post right colon resection for T3, N1 Ca. Completed chemotherapy. Exam stable.      Plan    Has appointment with Dr. Oliva Bustard in one month. She needs a colonoscopy. If this is normal and if it is acceptable with oncology, the  port can be removed.  Follow up: 6 months.   Colonoscopy with possible biopsy/polypectomy prn: Information regarding the procedure, including its potential risks and complications (including but not limited to perforation of the bowel, which may require emergency surgery to repair, and bleeding) was verbally given to the patient. Educational information regarding lower instestinal endoscopy was given to the patient. Written instructions for how to complete the bowel prep using Miralax were provided. The importance of drinking ample fluids to avoid dehydration as a result of the prep emphasized.  Patient is scheduled for a colonoscopy at [facility] on 10/04/14. She is aware to pre register at least 2 days prior. She will only take her Toprol XL at 6 am with a small sip of  water the morning of. Miralax prescription has been sent into her pharmacy. Patient is aware of date and instructions.       PCP:  Beatrice Lecher 09/12/2014, 4:44 PM

## 2014-09-12 NOTE — Patient Instructions (Addendum)
Colonoscopy A colonoscopy is an exam to look at the entire large intestine (colon). This exam can help find problems such as tumors, polyps, inflammation, and areas of bleeding. The exam takes about 1 hour.  LET [facility] CARE PROVIDER KNOW ABOUT:   Any allergies you have.  All medicines you are taking, including vitamins, herbs, eye drops, creams, and over-the-counter medicines.  Previous problems you or members of your family have had with the use of anesthetics.  Any blood disorders you have.  Previous surgeries you have had.  Medical conditions you have. RISKS AND COMPLICATIONS  Generally, this is a safe procedure. However, as with any procedure, complications can occur. Possible complications include:  Bleeding.  Tearing or rupture of the colon wall.  Reaction to medicines given during the exam.  Infection (rare). BEFORE THE PROCEDURE   Ask your health care provider about changing or stopping your regular medicines.  You may be prescribed an oral bowel prep. This involves drinking a large amount of medicated liquid, starting the day before your procedure. The liquid will cause you to have multiple loose stools until your stool is almost clear or light green. This cleans out your colon in preparation for the procedure.  Do not eat or drink anything else once you have started the bowel prep, unless your health care provider tells you it is safe to do so.  Arrange for someone to drive you home after the procedure. PROCEDURE   You will be given medicine to help you relax (sedative).  You will lie on your side with your knees bent.  A long, flexible tube with a light and camera on the end (colonoscope) will be inserted through the rectum and into the colon. The camera sends video back to a computer screen as it moves through the colon. The colonoscope also releases carbon dioxide gas to inflate the colon. This helps your health care provider see the area better.  During  the exam, your health care provider may take a small tissue sample (biopsy) to be examined under a microscope if any abnormalities are found.  The exam is finished when the entire colon has been viewed. AFTER THE PROCEDURE   Do not drive for 24 hours after the exam.  You may have a small amount of blood in your stool.  You may pass moderate amounts of gas and have mild abdominal cramping or bloating. This is caused by the gas used to inflate your colon during the exam.  Ask when your test results will be ready and how you will get your results. Make sure you get your test results. Document Released: 03/07/2000 Document Revised: 12/29/2012 Document Reviewed: 11/15/2012 [facility] Patient Information 2015 [place], [place]. This information is not intended to replace advice given to you by your health care provider. Make sure you discuss any questions you have with your health care provider.  Patient is scheduled for a colonoscopy at [facility] on 10/04/14. She is aware to pre register at least 2 days prior. She will only take her Toprol XL at 6 am with a small sip of water the morning of. Miralax prescription has been sent into her pharmacy. Patient is aware of date and instructions.

## 2014-09-13 ENCOUNTER — Ambulatory Visit: Payer: Self-pay | Admitting: Oncology

## 2014-09-13 ENCOUNTER — Other Ambulatory Visit: Payer: Self-pay

## 2014-10-04 ENCOUNTER — Encounter: Admission: RE | Payer: Self-pay | Source: Ambulatory Visit

## 2014-10-04 ENCOUNTER — Ambulatory Visit
Admission: RE | Admit: 2014-10-04 | Payer: Commercial Managed Care - HMO | Source: Ambulatory Visit | Admitting: General Surgery

## 2014-10-04 ENCOUNTER — Telehealth: Payer: Self-pay | Admitting: *Deleted

## 2014-10-04 SURGERY — COLONOSCOPY
Anesthesia: General

## 2014-10-04 MED ORDER — PEG 3350-KCL-NA BICARB-NACL 420 G PO SOLR
ORAL | Status: DC
Start: 2014-10-04 — End: 2015-10-30

## 2014-10-04 NOTE — Telephone Encounter (Signed)
Spoke with patient today and she reports that she had a reaction to the Miralax that was used for colonoscopy preparation. Patient was unable to have colonoscopy completed as scheduled today.   Patient has been rescheduled for her colonoscopy which is now scheduled for 10-18-14 at [facility].   This patient will complete the GoLytely bowel prep with bisacodyl tablets. Prescription sent to her pharmacy electronically. Instructions will be mailed to the patient.   She was instructed to call the office should she have further questions.

## 2014-10-10 ENCOUNTER — Ambulatory Visit (INDEPENDENT_AMBULATORY_CARE_PROVIDER_SITE_OTHER): Payer: Commercial Managed Care - HMO | Admitting: Family Medicine

## 2014-10-10 ENCOUNTER — Encounter: Payer: Self-pay | Admitting: Family Medicine

## 2014-10-10 VITALS — BP 118/88 | HR 64 | Temp 98.3°F | Resp 16 | Wt 176.2 lb

## 2014-10-10 DIAGNOSIS — R5381 Other malaise: Secondary | ICD-10-CM

## 2014-10-10 DIAGNOSIS — Z85038 Personal history of other malignant neoplasm of large intestine: Secondary | ICD-10-CM

## 2014-10-10 DIAGNOSIS — E039 Hypothyroidism, unspecified: Secondary | ICD-10-CM

## 2014-10-10 DIAGNOSIS — F341 Dysthymic disorder: Secondary | ICD-10-CM

## 2014-10-10 DIAGNOSIS — F329 Major depressive disorder, single episode, unspecified: Secondary | ICD-10-CM

## 2014-10-10 DIAGNOSIS — R5383 Other fatigue: Secondary | ICD-10-CM | POA: Diagnosis not present

## 2014-10-10 DIAGNOSIS — Z862 Personal history of diseases of the blood and blood-forming organs and certain disorders involving the immune mechanism: Secondary | ICD-10-CM

## 2014-10-10 MED ORDER — ZOLPIDEM TARTRATE 5 MG PO TABS: 5.0000 mg | ORAL_TABLET | Freq: Every evening | ORAL | Status: DC | PRN

## 2014-10-10 NOTE — Progress Notes (Signed)
Subjective:    Patient ID: Melissa Fitzgerald, female    DOB: [birth year], 69 y.o.   MRN: [id number] Chief Complaint  Patient presents with  . Depression    I think it's my thyroid acting up again.   Marland Kitchen Pruritis    started 3 days ago, took some benedryl, it helped it" pt stated    HPI  This 69 year old female presents for evaluation of depressive symptoms (nervous, sad, tired, spontaneous crying and slight dizziness) the past week or two. No suicidal ideation. Some headaches and thinks symptoms are from poor sleep pattern and/or hypothyroidism. History of post op hypothyroidism.  Past Medical History  Diagnosis Date  . Irritable bowel disease   . Colon polyp   . Anemia   . Thyroid disease   . Hyperlipidemia   . Kidney stone    Past Surgical History  Procedure Laterality Date  . Colonoscopy  09-13-13    Dr [name]  . Tonsillectomy    . Kidney stone surgery    . Thyroid surgery    . Foot surgery    . Nasal sinus surgery    . Abdominal hysterectomy    . Cholecystectomy  2000  . Portacath placement  10/24/13  . Breast enhancement surgery     History  Substance Use Topics  . Smoking status: Current Some Day Smoker -- 0.25 packs/day    Types: Cigarettes  . Smokeless tobacco: Not on file     Comment: still smoking every now and then, rarely one  . Alcohol Use: 0.0 oz/week    0 Standard drinks or equivalent per week     Comment: occasionally   Family History  Problem Relation Age of Onset  . Colon cancer      first cousin  . Breast cancer Maternal Aunt   . Breast cancer Paternal Aunt   . Heart disease Mother    Current Outpatient Prescriptions on File Prior to Visit  Medication Sig Dispense Refill  . esomeprazole (NEXIUM) 40 MG capsule Take 40 mg by mouth daily at 12 noon.    Marland Kitchen levothyroxine (SYNTHROID, LEVOTHROID) 100 MCG tablet Take 125 mcg by mouth daily before breakfast.     . metoprolol succinate (TOPROL-XL) 25 MG 24 hr tablet Take 25 mg by mouth daily.    .  polyethylene glycol powder (GLYCOLAX/MIRALAX) powder Take 255 g by mouth once. (Patient not taking: Reported on 10/10/2014) 255 g 0  . polyethylene glycol-electrolytes (NULYTELY/GOLYTELY) 420 G solution one bottle for colonoscopy prep (Patient not taking: Reported on 10/10/2014) 4000 mL 0   No current facility-administered medications on file prior to visit.   Allergies  Allergen Reactions  . Codeine Nausea And Vomiting  . Oxycodone Nausea And Vomiting  . Penicillins Nausea And Vomiting    Review of Systems  Constitutional: Positive for fatigue.  HENT: Negative.   Eyes: Negative.   Respiratory: Negative.   Cardiovascular: Negative.   Gastrointestinal: Negative.   Genitourinary: Negative.   Neurological: Negative.   Psychiatric/Behavioral: Positive for sleep disturbance and dysphoric mood. The patient is nervous/anxious.       BP 118/88 mmHg  Pulse 64  Temp(Src) 98.3 F (36.8 C) (Oral)  Resp 16  Wt 176 lb 3.2 oz (79.924 kg)  Objective:   Physical Exam  Constitutional: She is oriented to person, place, and time. She appears well-developed and well-nourished.  HENT:  Head: Normocephalic and atraumatic.  Right Ear: External ear normal.  Left Ear: External ear normal.  Eyes:  Conjunctivae and EOM are normal. Pupils are equal, round, and reactive to light.  Neck: Normal range of motion. Neck supple.  Cardiovascular: Normal rate, regular rhythm and normal heart sounds.   Pulmonary/Chest: Effort normal and breath sounds normal.  Abdominal: Soft. Bowel sounds are normal.  Well healed midline scar from past right hemicolectomy. Left upper chest port-a-cath.  Musculoskeletal: Normal range of motion.  Neurological: She is alert and oriented to person, place, and time. She has normal reflexes.  Skin: Skin is warm.  Psychiatric: Her speech is normal. Her mood appears anxious. She is slowed and withdrawn. She exhibits a depressed mood.      Assessment & Plan:  1. Malaise and  fatigue Persistent and some increase in symptoms with headache over the past few weeks. States she needs refill of medication for headaches and recheck of thyroid. Has finished a round of chemotherapy for colon cancer and will have follow up with surgeon and oncologist next week. Will check for metabolic imbalance and refill Tramadol for headache prn not relieved by Tylenol.  - CBC with Differential/Platelet - Comprehensive metabolic panel  2. Depressive reaction Feeling sad and some spontaneous crying over the past couple weeks. Thinks it is due to poor sleep and hypothyroidism. Will refill - zolpidem (AMBIEN) 5 MG tablet; Take 1 tablet (5 mg total) by mouth at bedtime as needed for sleep.  Dispense: 30 tablet; Refill: 0  3. Hypothyroidism, unspecified hypothyroidism type Recent malaise and depressive reaction. Will recheck thyroid function to see if dosage adjustment of Levothyroxine 125 mcg qd. - T4 - TSH  4. Hx of iron deficiency anemia Due for recheck of blood counts and ferritin level. With fatigue symptoms, may need extra supplementation. - CBC with Differential/Platelet - Ferritin  5. 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 Last chemo therapy was February 2016. To see oncologist (Dr. Oliva Bustard) next week for port to be flushed or removed and repeat colonoscopy by Dr. Jamal Collin (surgeon) on 10-18-14. Denies hematochezia/melena and no constipation or diarrhea.

## 2014-10-11 LAB — COMPREHENSIVE METABOLIC PANEL
ALT: 10 IU/L (ref 0–32)
AST: 17 IU/L (ref 0–40)
Albumin/Globulin Ratio: 1.5 (ref 1.1–2.5)
Albumin: 4.5 g/dL (ref 3.6–4.8)
Alkaline Phosphatase: 68 IU/L (ref 39–117)
BUN/Creatinine Ratio: 12 (ref 11–26)
BUN: 9 mg/dL (ref 8–27)
Bilirubin Total: 0.4 mg/dL (ref 0.0–1.2)
CO2: 23 mmol/L (ref 18–29)
Calcium: 9.7 mg/dL (ref 8.7–10.3)
Chloride: 102 mmol/L (ref 97–108)
Creatinine, Ser: 0.78 mg/dL (ref 0.57–1.00)
GFR calc Af Amer: 90 mL/min/{1.73_m2} (ref >59)
GFR calc non Af Amer: 78 mL/min/{1.73_m2} (ref >59)
Globulin, Total: 3.1 g/dL (ref 1.5–4.5)
Glucose: 120 mg/dL — ABNORMAL HIGH (ref 65–99)
Potassium: 3.3 mmol/L — ABNORMAL LOW (ref 3.5–5.2)
Sodium: 142 mmol/L (ref 134–144)
Total Protein: 7.6 g/dL (ref 6.0–8.5)

## 2014-10-11 LAB — FERRITIN: Ferritin: 15 ng/mL (ref 15–150)

## 2014-10-11 LAB — CBC WITH DIFFERENTIAL/PLATELET
Basophils Absolute: 0 10*3/uL (ref 0.0–0.2)
Basos: 0 %
EOS (ABSOLUTE): 0 10*3/uL (ref 0.0–0.4)
Eos: 1 %
Hematocrit: 32.7 % — ABNORMAL LOW (ref 34.0–46.6)
Hemoglobin: 10.6 g/dL — ABNORMAL LOW (ref 11.1–15.9)
Immature Grans (Abs): 0 10*3/uL (ref 0.0–0.1)
Immature Granulocytes: 0 %
Lymphocytes Absolute: 1.5 10*3/uL (ref 0.7–3.1)
Lymphs: 36 %
MCH: 28.9 pg (ref 26.6–33.0)
MCHC: 32.4 g/dL (ref 31.5–35.7)
MCV: 89 fL (ref 79–97)
Monocytes Absolute: 0.2 10*3/uL (ref 0.1–0.9)
Monocytes: 5 %
Neutrophils Absolute: 2.4 10*3/uL (ref 1.4–7.0)
Neutrophils: 58 %
Platelets: 284 10*3/uL (ref 150–379)
RBC: 3.67 x10E6/uL — ABNORMAL LOW (ref 3.77–5.28)
RDW: 15.7 % — ABNORMAL HIGH (ref 12.3–15.4)
WBC: 4.1 10*3/uL (ref 3.4–10.8)

## 2014-10-11 LAB — T4: T4, Total: 13.2 ug/dL — ABNORMAL HIGH (ref 4.5–12.0)

## 2014-10-11 LAB — TSH: TSH: 0.317 u[IU]/mL — ABNORMAL LOW (ref 0.450–4.500)

## 2014-10-13 ENCOUNTER — Other Ambulatory Visit: Payer: Self-pay | Admitting: *Deleted

## 2014-10-13 DIAGNOSIS — C180 Malignant neoplasm of cecum: Secondary | ICD-10-CM

## 2014-10-16 ENCOUNTER — Inpatient Hospital Stay: Payer: Commercial Managed Care - HMO

## 2014-10-16 ENCOUNTER — Encounter: Payer: Self-pay | Admitting: Oncology

## 2014-10-16 ENCOUNTER — Inpatient Hospital Stay: Payer: Commercial Managed Care - HMO | Attending: Oncology | Admitting: Oncology

## 2014-10-16 ENCOUNTER — Telehealth: Payer: Self-pay | Admitting: Family Medicine

## 2014-10-16 VITALS — BP 166/101 | HR 88 | Temp 97.3°F | Resp 18 | Wt 173.9 lb

## 2014-10-16 DIAGNOSIS — Z85038 Personal history of other malignant neoplasm of large intestine: Secondary | ICD-10-CM | POA: Diagnosis not present

## 2014-10-16 DIAGNOSIS — Z79899 Other long term (current) drug therapy: Secondary | ICD-10-CM

## 2014-10-16 DIAGNOSIS — E039 Hypothyroidism, unspecified: Secondary | ICD-10-CM

## 2014-10-16 DIAGNOSIS — Z9221 Personal history of antineoplastic chemotherapy: Secondary | ICD-10-CM | POA: Diagnosis not present

## 2014-10-16 DIAGNOSIS — C180 Malignant neoplasm of cecum: Secondary | ICD-10-CM

## 2014-10-16 DIAGNOSIS — D509 Iron deficiency anemia, unspecified: Secondary | ICD-10-CM

## 2014-10-16 DIAGNOSIS — Z452 Encounter for adjustment and management of vascular access device: Secondary | ICD-10-CM | POA: Diagnosis not present

## 2014-10-16 LAB — CBC WITH DIFFERENTIAL/PLATELET
Basophils Absolute: 0 10*3/uL (ref 0–0.1)
Basophils Relative: 0 %
Eosinophils Absolute: 0 10*3/uL (ref 0–0.7)
Eosinophils Relative: 0 %
HCT: 33.5 % — ABNORMAL LOW (ref 35.0–47.0)
Hemoglobin: 10.7 g/dL — ABNORMAL LOW (ref 12.0–16.0)
Lymphocytes Relative: 20 %
Lymphs Abs: 1 10*3/uL (ref 1.0–3.6)
MCH: 28.6 pg (ref 26.0–34.0)
MCHC: 31.9 g/dL — ABNORMAL LOW (ref 32.0–36.0)
MCV: 89.8 fL (ref 80.0–100.0)
Monocytes Absolute: 0.2 10*3/uL (ref 0.2–0.9)
Monocytes Relative: 4 %
Neutro Abs: 3.7 10*3/uL (ref 1.4–6.5)
Neutrophils Relative %: 76 %
Platelets: 247 10*3/uL (ref 150–440)
RBC: 3.73 MIL/uL — ABNORMAL LOW (ref 3.80–5.20)
RDW: 15.2 % — ABNORMAL HIGH (ref 11.5–14.5)
WBC: 5 10*3/uL (ref 3.6–11.0)

## 2014-10-16 LAB — COMPREHENSIVE METABOLIC PANEL
ALT: 13 U/L — ABNORMAL LOW (ref 14–54)
AST: 22 U/L (ref 15–41)
Albumin: 4.4 g/dL (ref 3.5–5.0)
Alkaline Phosphatase: 60 U/L (ref 38–126)
Anion gap: 8 (ref 5–15)
BUN: 12 mg/dL (ref 6–20)
CO2: 27 mmol/L (ref 22–32)
Calcium: 8.8 mg/dL — ABNORMAL LOW (ref 8.9–10.3)
Chloride: 105 mmol/L (ref 101–111)
Creatinine, Ser: 0.68 mg/dL (ref 0.44–1.00)
GFR calc Af Amer: >60 mL/min (ref >60)
GFR calc non Af Amer: >60 mL/min (ref >60)
Glucose, Bld: 132 mg/dL — ABNORMAL HIGH (ref 65–99)
Potassium: 3.6 mmol/L (ref 3.5–5.1)
Sodium: 140 mmol/L (ref 135–145)
Total Bilirubin: 0.5 mg/dL (ref 0.3–1.2)
Total Protein: 8.2 g/dL — ABNORMAL HIGH (ref 6.5–8.1)

## 2014-10-16 LAB — MAGNESIUM: Magnesium: 2 mg/dL (ref 1.7–2.4)

## 2014-10-16 MED ORDER — HEPARIN SOD (PORK) LOCK FLUSH 100 UNIT/ML IV SOLN
500.0000 [IU] | Freq: Once | INTRAVENOUS | Status: AC
  Administered 2014-10-16: 500 [IU] via INTRAVENOUS
  Filled 2014-10-16: qty 5

## 2014-10-16 MED ORDER — FERROUS FUMARATE 150 MG PO TABS: 1.0000 | ORAL_TABLET | Freq: Two times a day (BID) | ORAL | Status: DC

## 2014-10-16 MED ORDER — SODIUM CHLORIDE 0.9 % IJ SOLN
10.0000 mL | INTRAMUSCULAR | Status: DC | PRN
  Administered 2014-10-16: 10 mL via INTRAVENOUS
  Filled 2014-10-16: qty 10

## 2014-10-16 NOTE — Telephone Encounter (Signed)
Pt called to get results to labs that were ordered last week. Pt request that if she doesn't answer to please leave a message. Thanks TNP

## 2014-10-16 NOTE — Progress Notes (Signed)
[place] @ [facility] [phone]  Fax:([phone]) [place]: 1945-05-14  MR#: [id number]  XBM#:[id number]  Patient Care Team: Birdie Sons, MD as PCP - General (Family Medicine) [name], MD as Consulting Physician (General Surgery) Lollie Sails, MD as Referring Physician (Internal Medicine)  CHIEF COMPLAINT:  Chief Complaint  Patient presents with  . Follow-up    Oncology History   69 year old lady who does not have any major medical problem was admitted in the hospital for anemia requiring blood transfusion.  Patient underwent colonoscopy.  In infiltrative  mass was found in the ascending colon .   1. 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 Jamal Collin Colonoscopy was done in June of 23, 2015 by Dr. Gustavo Lah. 2. Started adjuvant chemotherapy with FOLFOX on August 12th 2015 oxaliplatin was put on hold after 6 cycles because of neuropathy 3.send finished 12 cycles of chemotherapy with FOLFOX.  Last few cycle oxaliplatin was held because of neuropath     Cancer of colon   09/21/2013 Initial Diagnosis Cancer of colon    No flowsheet data found.  INTERVAL HISTORY: 69 year old lady with a history of carcinoma of colon came today further follow-up complains of burning at the site of the port in pain.  Patient is getting colonoscopy done next week by Dr. Jamal Collin.  Patient had a recent evaluation for thyroid function T4 is high.  Primary doctor is managing.  Also hemoglobin is low and ferritin is low.  No rectal bleeding.  Patient also had some burning pain in the wrist area.  Patient feels like she had a flu last week.  Also complains of burning pain in the abdominal area. REVIEW OF SYSTEMS:     general status: Patient is feeling weak and tired.  No change in a performance status.  No chills.  No fever. HEENT: Alopecia.  No evidence of  stomatitis Lungs: No cough or shortness of breath Cardiac: No chest pain or paroxysmal nocturnal dyspnea GI: No nausea no vomiting no diarrhea no abdominal pain Skin: No rash Lower extremity no swelling Neurological system: No tingling.  No numbness.  No other focal signs Musculoskeletal system no bony pains As per history of present illness As per HPI. Otherwise, a complete review of systems is negatve.  PAST MEDICAL HISTORY: Past Medical History  Diagnosis Date  . Irritable bowel disease   . Colon polyp   . Anemia   . Thyroid disease   . Hyperlipidemia   . Kidney stone     PAST SURGICAL HISTORY: Past Surgical History  Procedure Laterality Date  . Colonoscopy  09-13-13    Dr [name]  . Tonsillectomy    . Kidney stone surgery    . Thyroid surgery    . Foot surgery    . Nasal sinus surgery    . Abdominal hysterectomy    . Cholecystectomy  2000  . Portacath placement  10/24/13  . Breast enhancement surgery      FAMILY HISTORY Family History  Problem Relation Age of Onset  . Colon cancer      first cousin  . Breast cancer Maternal Aunt   . Breast cancer Paternal Aunt   . Heart disease Mother     ADVANCED DIRECTIVES:  No flowsheet data found.  HEALTH MAINTENANCE: History  Substance Use Topics  . Smoking status: Current Some Day  Smoker -- 0.25 packs/day    Types: Cigarettes  . Smokeless tobacco: Not on file     Comment: still smoking every now and then, rarely one  . Alcohol Use: 0.0 oz/week    0 Standard drinks or equivalent per week     Comment: occasionally      Allergies  Allergen Reactions  . Codeine Nausea And Vomiting  . Oxycodone Nausea And Vomiting  . Penicillins Nausea And Vomiting    Current Outpatient Prescriptions  Medication Sig Dispense Refill  . esomeprazole (NEXIUM) 40 MG capsule Take 40 mg by mouth daily at 12 noon.    Marland Kitchen levothyroxine (SYNTHROID, LEVOTHROID) 100 MCG tablet Take 125 mcg by mouth daily before breakfast.     .  metoprolol succinate (TOPROL-XL) 25 MG 24 hr tablet Take 25 mg by mouth daily.    . polyethylene glycol powder (GLYCOLAX/MIRALAX) powder Take 255 g by mouth once. 255 g 0  . zolpidem (AMBIEN) 5 MG tablet Take 1 tablet (5 mg total) by mouth at bedtime as needed for sleep. 30 tablet 0  . polyethylene glycol-electrolytes (NULYTELY/GOLYTELY) 420 G solution one bottle for colonoscopy prep (Patient not taking: Reported on 10/16/2014) 4000 mL 0   No current facility-administered medications for this visit.    OBJECTIVE:  Filed Vitals:   10/16/14 1434  BP: 166/101  Pulse: 88  Temp: 97.3 F (36.3 C)  Resp: 18     Body mass index is 28.09 kg/(m^2).    ECOG FS:1 - Symptomatic but completely ambulatory  PHYSICAL EXAM: GENERAL:  Well developed, well nourished, sitting comfortably in the exam room in no acute distress. MENTAL STATUS:  Alert and oriented to person, place and time.  symmetric, no Cushingoid features.  ENT:  Oropharynx clear without lesion.  Tongue normal. Mucous membranes moist.  RESPIRATORY:  Clear to auscultation without rales, wheezes or rhonchi. CARDIOVASCULAR:  Regular rate and rhythm without murmur, rub or gallop.  ABDOMEN:  Soft, non-tender, with active bowel sounds, and no hepatosplenomegaly.  No masses. BACK:  No CVA tenderness.  No tenderness on percussion of the back or rib cage. SKIN:  No rashes, ulcers or lesions. EXTREMITIES: No edema, no skin discoloration or tenderness.  No palpable cords. LYMPH NODES: No palpable cervical, supraclavicular, axillary or inguinal adenopathy  NEUROLOGICAL: Unremarkable. PSYCH:  Appropriate.   LAB RESULTS:  Infusion on 10/16/2014  Component Date Value Ref Range Status  . WBC 10/16/2014 5.0  3.6 - 11.0 K/uL Final   A-LINE DRAW  . RBC 10/16/2014 3.73* 3.80 - 5.20 MIL/uL Final  . Hemoglobin 10/16/2014 10.7* 12.0 - 16.0 g/dL Final  . HCT 10/16/2014 33.5* 35.0 - 47.0 % Final  . MCV 10/16/2014 89.8  80.0 - 100.0 fL Final  . MCH  10/16/2014 28.6  26.0 - 34.0 pg Final  . MCHC 10/16/2014 31.9* 32.0 - 36.0 g/dL Final  . RDW 10/16/2014 15.2* 11.5 - 14.5 % Final  . Platelets 10/16/2014 247  150 - 440 K/uL Final  . Neutrophils Relative % 10/16/2014 76   Final  . Neutro Abs 10/16/2014 3.7  1.4 - 6.5 K/uL Final  . Lymphocytes Relative 10/16/2014 20   Final  . Lymphs Abs 10/16/2014 1.0  1.0 - 3.6 K/uL Final  . Monocytes Relative 10/16/2014 4   Final  . Monocytes Absolute 10/16/2014 0.2  0.2 - 0.9 K/uL Final  . Eosinophils Relative 10/16/2014 0   Final  . Eosinophils Absolute 10/16/2014 0.0  0 - 0.7 K/uL Final  . Basophils  Relative 10/16/2014 0   Final  . Basophils Absolute 10/16/2014 0.0  0 - 0.1 K/uL Final  . Sodium 10/16/2014 140  135 - 145 mmol/L Final  . Potassium 10/16/2014 3.6  3.5 - 5.1 mmol/L Final  . Chloride 10/16/2014 105  101 - 111 mmol/L Final  . CO2 10/16/2014 27  22 - 32 mmol/L Final  . Glucose, Bld 10/16/2014 132* 65 - 99 mg/dL Final  . BUN 10/16/2014 12  6 - 20 mg/dL Final  . Creatinine, Ser 10/16/2014 0.68  0.44 - 1.00 mg/dL Final  . Calcium 10/16/2014 8.8* 8.9 - 10.3 mg/dL Final  . Total Protein 10/16/2014 8.2* 6.5 - 8.1 g/dL Final  . Albumin 10/16/2014 4.4  3.5 - 5.0 g/dL Final  . AST 10/16/2014 22  15 - 41 U/L Final  . ALT 10/16/2014 13* 14 - 54 U/L Final  . Alkaline Phosphatase 10/16/2014 60  38 - 126 U/L Final  . Total Bilirubin 10/16/2014 0.5  0.3 - 1.2 mg/dL Final  . GFR calc non Af Amer 10/16/2014 >60  >60 mL/min Final  . GFR calc Af Amer 10/16/2014 >60  >60 mL/min Final   Comment: (NOTE) The eGFR has been calculated using the CKD EPI equation. This calculation has not been validated in all clinical situations. eGFR's persistently <60 mL/min signify possible Chronic Kidney Disease.   . Anion gap 10/16/2014 8  5 - 15 Final  . Magnesium 10/16/2014 2.0  1.7 - 2.4 mg/dL Final     ASSESSMENT: Carcinoma of colon status post chemotherapy with FOLFOX Anemia with ferritin level is  low Abdominal pain patient is getting colonoscopy done Hypothyroidism patient is on Synthroid 125 g.  MEDICAL DECISION MAKING:  Lab data has been reviewed. Anemia is iron deficiency will start patient on oral iron and recheck hemoglobin in 3 months CEA is pending Patient's port needs to be removed as it appears to be pushing under the skin will get Dr. Jamal Collin to remove port T4 is high and will get Dr. Salomon Mast to [place] dose of the Synthroid I told patient to Araceli Bouche give a call to Dr. Salomon Mast office for arrangement On clinical examination there is no evidence of recurrent disease Patient expressed understanding and was in agreement with this plan. She also understands that She can call clinic at any time with any questions, concerns, or complaints.    No matching staging information was found for the patient.  Forest Gleason, MD   10/16/2014 2:52 PM

## 2014-10-16 NOTE — Progress Notes (Signed)
Patient does have living will.  Current sometimes smoker.  Patient complains of feeling tired, depressed, moody, anxious.  States she has pain in wrists, thighs.

## 2014-10-17 ENCOUNTER — Telehealth: Payer: Self-pay

## 2014-10-17 LAB — CEA: CEA: 1.9 ng/mL (ref 0.0–4.7)

## 2014-10-17 MED ORDER — LEVOTHYROXINE SODIUM 100 MCG PO TABS: 100.0000 ug | ORAL_TABLET | Freq: Every day | ORAL | Status: DC

## 2014-10-17 NOTE — Telephone Encounter (Signed)
Patient advised of lab results as directed in lab result message.

## 2014-10-17 NOTE — Telephone Encounter (Signed)
Patient advised as directed below. Patient verbalized understanding and agrees with treatment plan. Patient requested Levothyroxine 100 mcg refill be sent to pharmacy. RX sent to [facility] Patient states she has a RX for Klor-Con 20 meq at home and does not need refills at this time. Patient scheduled for 1 month follow up to recheck labs.

## 2014-10-17 NOTE — Telephone Encounter (Signed)
-----   Message from [name], [place] sent at 10/16/2014  2:11 AM EDT ----- Anemia better than 6 months ago. Iron borderline and still need iron supplement. Thyroid looks like level high and need to go to Levothyroxine 100 mcg one daily and recheck level in 3 months. Potassium low and needs Klor-con 20 meq qd #30 and recheck potassium and anemia levels in a month.

## 2014-10-18 ENCOUNTER — Encounter: Payer: Self-pay | Admitting: Anesthesiology

## 2014-10-18 ENCOUNTER — Telehealth: Payer: Self-pay | Admitting: *Deleted

## 2014-10-18 ENCOUNTER — Ambulatory Visit: Payer: Commercial Managed Care - HMO | Admitting: Anesthesiology

## 2014-10-18 ENCOUNTER — Ambulatory Visit
Admission: RE | Admit: 2014-10-18 | Discharge: 2014-10-18 | Disposition: A | Discharge status: Home Health | Payer: Commercial Managed Care - HMO | Source: Ambulatory Visit | Attending: General Surgery | Admitting: General Surgery

## 2014-10-18 ENCOUNTER — Encounter
Admission: RE | Disposition: A | Discharge status: Home Health | Payer: Self-pay | Source: Ambulatory Visit | Attending: General Surgery

## 2014-10-18 DIAGNOSIS — Z9221 Personal history of antineoplastic chemotherapy: Secondary | ICD-10-CM | POA: Insufficient documentation

## 2014-10-18 DIAGNOSIS — K5909 Other constipation: Secondary | ICD-10-CM | POA: Insufficient documentation

## 2014-10-18 DIAGNOSIS — M1990 Unspecified osteoarthritis, unspecified site: Secondary | ICD-10-CM | POA: Diagnosis not present

## 2014-10-18 DIAGNOSIS — Z85038 Personal history of other malignant neoplasm of large intestine: Secondary | ICD-10-CM

## 2014-10-18 DIAGNOSIS — Z800 Family history of malignant neoplasm of digestive organs: Secondary | ICD-10-CM | POA: Diagnosis not present

## 2014-10-18 DIAGNOSIS — Z08 Encounter for follow-up examination after completed treatment for malignant neoplasm: Secondary | ICD-10-CM | POA: Diagnosis not present

## 2014-10-18 DIAGNOSIS — K635 Polyp of colon: Secondary | ICD-10-CM | POA: Diagnosis not present

## 2014-10-18 DIAGNOSIS — E079 Disorder of thyroid, unspecified: Secondary | ICD-10-CM | POA: Insufficient documentation

## 2014-10-18 DIAGNOSIS — K589 Irritable bowel syndrome without diarrhea: Secondary | ICD-10-CM | POA: Insufficient documentation

## 2014-10-18 DIAGNOSIS — F17210 Nicotine dependence, cigarettes, uncomplicated: Secondary | ICD-10-CM | POA: Insufficient documentation

## 2014-10-18 DIAGNOSIS — R748 Abnormal levels of other serum enzymes: Secondary | ICD-10-CM

## 2014-10-18 DIAGNOSIS — Z885 Allergy status to narcotic agent status: Secondary | ICD-10-CM | POA: Diagnosis not present

## 2014-10-18 DIAGNOSIS — D649 Anemia, unspecified: Secondary | ICD-10-CM | POA: Insufficient documentation

## 2014-10-18 DIAGNOSIS — E785 Hyperlipidemia, unspecified: Secondary | ICD-10-CM | POA: Insufficient documentation

## 2014-10-18 DIAGNOSIS — G479 Sleep disorder, unspecified: Secondary | ICD-10-CM

## 2014-10-18 DIAGNOSIS — Z9049 Acquired absence of other specified parts of digestive tract: Secondary | ICD-10-CM | POA: Diagnosis not present

## 2014-10-18 DIAGNOSIS — R51 Headache: Secondary | ICD-10-CM

## 2014-10-18 DIAGNOSIS — Z880 Allergy status to penicillin: Secondary | ICD-10-CM | POA: Insufficient documentation

## 2014-10-18 DIAGNOSIS — Z86010 Personal history of colonic polyps: Secondary | ICD-10-CM | POA: Diagnosis not present

## 2014-10-18 DIAGNOSIS — Z87442 Personal history of urinary calculi: Secondary | ICD-10-CM | POA: Insufficient documentation

## 2014-10-18 DIAGNOSIS — R519 Headache, unspecified: Secondary | ICD-10-CM | POA: Insufficient documentation

## 2014-10-18 HISTORY — PX: COLONOSCOPY WITH PROPOFOL: SHX5780

## 2014-10-18 SURGERY — COLONOSCOPY WITH PROPOFOL
Anesthesia: General

## 2014-10-18 MED ORDER — LACTATED RINGERS IV SOLN
INTRAVENOUS | Status: DC
  Administered 2014-10-18: 1000 mL via INTRAVENOUS

## 2014-10-18 MED ORDER — MIDAZOLAM HCL 2 MG/2ML IJ SOLN
INTRAMUSCULAR | Status: DC | PRN
  Administered 2014-10-18: 2 mg via INTRAVENOUS

## 2014-10-18 MED ORDER — PROPOFOL INFUSION 10 MG/ML OPTIME
INTRAVENOUS | Status: DC | PRN
  Administered 2014-10-18: 100 ug/kg/min via INTRAVENOUS

## 2014-10-18 MED ORDER — LIDOCAINE HCL (PF) 1 % IJ SOLN
INTRAMUSCULAR | Status: DC | PRN
  Administered 2014-10-18: 30 mg

## 2014-10-18 MED ORDER — FERROUS FUMARATE 324 (106 FE) MG PO TABS: 1.0000 | ORAL_TABLET | Freq: Every day | ORAL | Status: DC

## 2014-10-18 MED ORDER — PROPOFOL 10 MG/ML IV BOLUS
INTRAVENOUS | Status: DC | PRN
  Administered 2014-10-18: 70 mg via INTRAVENOUS

## 2014-10-18 MED ORDER — FENTANYL CITRATE (PF) 100 MCG/2ML IJ SOLN
INTRAMUSCULAR | Status: DC | PRN
  Administered 2014-10-18 (×2): 50 ug via INTRAVENOUS

## 2014-10-18 NOTE — Anesthesia Preprocedure Evaluation (Signed)
Anesthesia Evaluation  Patient identified by MRN, date of birth, ID band Patient awake    Reviewed: Allergy & Precautions, NPO status , Patient's Chart, lab work & pertinent test results, reviewed documented beta blocker date and time   Airway Mallampati: II       Dental  (+) Chipped   Pulmonary Current Smoker,    Pulmonary exam normal       Cardiovascular Normal cardiovascular exam    Neuro/Psych  Headaches, negative psych ROS   GI/Hepatic Elevated liver Enzymes Colon ca   Endo/Other    Renal/GU Hx of kidney stones     Musculoskeletal  (+) Arthritis -, Osteoarthritis,    Abdominal Normal abdominal exam  (+)   Peds  Hematology  (+) anemia ,   Anesthesia Other Findings   Reproductive/Obstetrics                             Anesthesia Physical Anesthesia Plan  ASA: III  Anesthesia Plan: General   Post-op Pain Management:    Induction: Intravenous  Airway Management Planned: Nasal Cannula  Additional Equipment:   Intra-op Plan:   Post-operative Plan:   Informed Consent: I have reviewed the patients History and Physical, chart, labs and discussed the procedure including the risks, benefits and alternatives for the proposed anesthesia with the patient or authorized representative who has indicated his/her understanding and acceptance.   Dental advisory given  Plan Discussed with: CRNA and Surgeon  Anesthesia Plan Comments:         Anesthesia Quick Evaluation

## 2014-10-18 NOTE — Anesthesia Postprocedure Evaluation (Signed)
  Anesthesia Post-op Note  Patient: Melissa Fitzgerald  Procedure(s) Performed: Procedure(s): COLONOSCOPY WITH PROPOFOL (N/A)  Anesthesia type:General  Patient location: PACU  Post pain: Pain level controlled  Post assessment: Post-op Vital signs reviewed, Patient's Cardiovascular Status Stable, Respiratory Function Stable, Patent Airway and No signs of Nausea or vomiting  Post vital signs: Reviewed and stable  Last Vitals:  Filed Vitals:   10/18/14 1230  BP: 150/72  Pulse: 54  Temp:   Resp: 16    Level of consciousness: awake, alert  and patient cooperative  Complications: No apparent anesthesia complications

## 2014-10-18 NOTE — Op Note (Signed)
[facility] Patient Name: Melissa Fitzgerald Procedure Date: 10/18/2014 11:15 AM MRN: [id number] Account #: [id number] Date of Birth: 1945-11-17 Admit Type: Outpatient Age: 69 Room: [facility] ENDO ROOM 1 Gender: Female Note Status: Finalized Procedure:         Colonoscopy Indications:       High risk colon cancer surveillance: Personal history of                     colon cancer Providers:         Orlie Pollen, MD Referring MD:      Vickki Muff. Chrismon, MD (Referring MD) Medicines:         General Anesthesia Complications:     No immediate complications. Procedure:         Pre-Anesthesia Assessment:                    - General anesthesia under the supervision of an                     anesthesiologist was determined to be medically necessary                     for this procedure based on review of the patient's                     medical history, medications, and prior anesthesia history.                    After obtaining informed consent, the colonoscope was                     passed under direct vision. Throughout the procedure, the                     patient's blood pressure, pulse, and oxygen saturations                     were monitored continuously. The Colonoscope was                     introduced through the anus and advanced to the the                     ileocolonic anastomosis. The colonoscopy was performed                     without difficulty. The patient tolerated the procedure                     well. The quality of the bowel preparation was excellent. Findings:      The perianal and digital rectal examinations were normal.      The entire examined colon appeared normal. Impression:        - The entire examined colon is normal.                    - No specimens collected. Recommendation:    - Repeat colonoscopy in 3 years for surveillance. Procedure Code(s): --- Professional ---                    [phone], Colonoscopy,  flexible; diagnostic, including                     collection of specimen(s) by brushing or  washing, when                     performed (separate procedure) Diagnosis Code(s): --- Professional ---                    G53.646, Personal history of other malignant neoplasm of                     large intestine CPT copyright 2014 American Medical Association. All rights reserved. The codes documented in this report are preliminary and upon coder review may  be revised to meet current compliance requirements. Orlie Pollen, MD 10/18/2014 11:52:31 AM This report has been signed electronically. Number of Addenda: 0 Note Initiated On: 10/18/2014 11:15 AM Scope Withdrawal Time: 0 hours 2 minutes 26 seconds  Total Procedure Duration: 0 hours 14 minutes 52 seconds       [facility]

## 2014-10-18 NOTE — Telephone Encounter (Signed)
E scribed new dose

## 2014-10-18 NOTE — H&P (Signed)
Melissa Fitzgerald is an 69 y.o. female.   Chief Complaint:colon cancer HPI: Pt is s/p colon resection for ca. Completed chemo. Here for surveillance coolonoscopy  Past Medical History  Diagnosis Date  . Irritable bowel disease   . Colon polyp   . Anemia   . Thyroid disease   . Hyperlipidemia   . Kidney stone     Past Surgical History  Procedure Laterality Date  . Colonoscopy  09-13-13    Dr [name]  . Tonsillectomy    . Kidney stone surgery    . Thyroid surgery    . Foot surgery    . Nasal sinus surgery    . Abdominal hysterectomy    . Cholecystectomy  2000  . Portacath placement  10/24/13  . Breast enhancement surgery      Family History  Problem Relation Age of Onset  . Colon cancer      first cousin  . Breast cancer Maternal Aunt   . Breast cancer Paternal Aunt   . Heart disease Mother   . Diabetes Mother   . Arthritis Sister   . Hyperlipidemia Sister   . COPD Brother   . Diabetes Sister    Social History:  reports that she has been smoking Cigarettes.  She has been smoking about 0.25 packs per day. She does not have any smokeless tobacco history on file. She reports that she drinks alcohol. She reports that she does not use illicit drugs.  Allergies:  Allergies  Allergen Reactions  . Codeine Nausea And Vomiting  . Oxycodone Nausea And Vomiting  . Penicillins Nausea And Vomiting    Medications Prior to Admission  Medication Sig Dispense Refill  . esomeprazole (NEXIUM) 40 MG capsule Take 40 mg by mouth daily at 12 noon.    . Ferrous Fumarate 150 MG TABS Take 1 tablet (150 mg total) by mouth 2 (two) times daily. 60 each 3  . levothyroxine (SYNTHROID, LEVOTHROID) 100 MCG tablet Take 1 tablet (100 mcg total) by mouth daily before breakfast. 30 tablet 2  . metoprolol succinate (TOPROL-XL) 25 MG 24 hr tablet Take 25 mg by mouth daily.    . polyethylene glycol powder (GLYCOLAX/MIRALAX) powder Take 255 g by mouth once. 255 g 0  . polyethylene glycol-electrolytes  (NULYTELY/GOLYTELY) 420 G solution one bottle for colonoscopy prep (Patient not taking: Reported on 10/16/2014) 4000 mL 0  . zolpidem (AMBIEN) 5 MG tablet Take 1 tablet (5 mg total) by mouth at bedtime as needed for sleep. 30 tablet 0    Results for orders placed or performed in visit on 10/16/14 (from the past 48 hour(s))  CBC with Differential     Status: Abnormal   Collection Time: 10/16/14  2:09 PM  Result Value Ref Range   WBC 5.0 3.6 - 11.0 K/uL    Comment: A-LINE DRAW   RBC 3.73 (L) 3.80 - 5.20 MIL/uL   Hemoglobin 10.7 (L) 12.0 - 16.0 g/dL   HCT 33.5 (L) 35.0 - 47.0 %   MCV 89.8 80.0 - 100.0 fL   MCH 28.6 26.0 - 34.0 pg   MCHC 31.9 (L) 32.0 - 36.0 g/dL   RDW 15.2 (H) 11.5 - 14.5 %   Platelets 247 150 - 440 K/uL   Neutrophils Relative % 76 %   Neutro Abs 3.7 1.4 - 6.5 K/uL   Lymphocytes Relative 20 %   Lymphs Abs 1.0 1.0 - 3.6 K/uL   Monocytes Relative 4 %   Monocytes Absolute 0.2 0.2 -  0.9 K/uL   Eosinophils Relative 0 %   Eosinophils Absolute 0.0 0 - 0.7 K/uL   Basophils Relative 0 %   Basophils Absolute 0.0 0 - 0.1 K/uL  Comprehensive metabolic panel     Status: Abnormal   Collection Time: 10/16/14  2:09 PM  Result Value Ref Range   Sodium 140 135 - 145 mmol/L   Potassium 3.6 3.5 - 5.1 mmol/L   Chloride 105 101 - 111 mmol/L   CO2 27 22 - 32 mmol/L   Glucose, Bld 132 (H) 65 - 99 mg/dL   BUN 12 6 - 20 mg/dL   Creatinine, Ser 0.68 0.44 - 1.00 mg/dL   Calcium 8.8 (L) 8.9 - 10.3 mg/dL   Total Protein 8.2 (H) 6.5 - 8.1 g/dL   Albumin 4.4 3.5 - 5.0 g/dL   AST 22 15 - 41 U/L   ALT 13 (L) 14 - 54 U/L   Alkaline Phosphatase 60 38 - 126 U/L   Total Bilirubin 0.5 0.3 - 1.2 mg/dL   GFR calc non Af Amer >60 >60 mL/min   GFR calc Af Amer >60 >60 mL/min    Comment: (NOTE) The eGFR has been calculated using the CKD EPI equation. This calculation has not been validated in all clinical situations. eGFR's persistently <60 mL/min signify possible Chronic Kidney Disease.     Anion gap 8 5 - 15  Magnesium     Status: None   Collection Time: 10/16/14  2:09 PM  Result Value Ref Range   Magnesium 2.0 1.7 - 2.4 mg/dL  CEA     Status: None   Collection Time: 10/16/14  2:09 PM  Result Value Ref Range   CEA 1.9 0.0 - 4.7 ng/mL    Comment: (NOTE)       Roche ECLIA methodology       Nonsmokers  <3.9                                     Smokers     <5.6 Performed At: [facility] [place], [place] [id number] Lindon Romp MD MM:[id number]    No results found.  ROS  There were no vitals taken for this visit. Physical Exam  Constitutional: She is oriented to person, place, and time. She appears well-developed and well-nourished.  Eyes: Conjunctivae are normal. No scleral icterus.  Neck: Neck supple.  Cardiovascular: Normal rate, regular rhythm and normal heart sounds.   Respiratory: Effort normal and breath sounds normal.  GI: Soft. Bowel sounds are normal. She exhibits no distension and no mass. There is no tenderness.  Neurological: She is alert and oriented to person, place, and time.     Assessment/Plan CA colon, post resection. Proceed with colonoscopy  Robyn Nohr G 10/18/2014, 10:25 AM

## 2014-10-18 NOTE — Transfer of Care (Signed)
Immediate Anesthesia Transfer of Care Note  Patient: Melissa Fitzgerald  Procedure(s) Performed: Procedure(s): COLONOSCOPY WITH PROPOFOL (N/A)  Patient Location: PACU  Anesthesia Type:General  Level of Consciousness: awake, alert  and oriented  Airway & Oxygen Therapy: Patient Spontanous Breathing and Patient connected to nasal cannula oxygen  Post-op Assessment: Report given to RN, Post -op Vital signs reviewed and stable and Patient moving all extremities  Post vital signs: Reviewed and stable  Last Vitals: There were no vitals filed for this visit.  Complications: No apparent anesthesia complications

## 2014-10-20 ENCOUNTER — Encounter: Payer: Self-pay | Admitting: General Surgery

## 2014-10-20 ENCOUNTER — Other Ambulatory Visit: Payer: Self-pay | Admitting: Family Medicine

## 2014-10-20 NOTE — Telephone Encounter (Signed)
Pt contacted office for refill request on the following medications:  Klor-Con 20mg .  Pt states she thought she had some but she is out.  [place]  CB#[phone]/MW

## 2014-10-23 ENCOUNTER — Other Ambulatory Visit: Payer: Self-pay

## 2014-10-23 DIAGNOSIS — F5105 Insomnia due to other mental disorder: Secondary | ICD-10-CM | POA: Insufficient documentation

## 2014-10-23 DIAGNOSIS — E785 Hyperlipidemia, unspecified: Secondary | ICD-10-CM | POA: Insufficient documentation

## 2014-10-23 DIAGNOSIS — G4700 Insomnia, unspecified: Secondary | ICD-10-CM | POA: Insufficient documentation

## 2014-10-23 DIAGNOSIS — K219 Gastro-esophageal reflux disease without esophagitis: Secondary | ICD-10-CM | POA: Insufficient documentation

## 2014-10-23 DIAGNOSIS — I10 Essential (primary) hypertension: Secondary | ICD-10-CM | POA: Insufficient documentation

## 2014-10-23 DIAGNOSIS — D509 Iron deficiency anemia, unspecified: Secondary | ICD-10-CM | POA: Insufficient documentation

## 2014-10-23 DIAGNOSIS — F32A Depression, unspecified: Secondary | ICD-10-CM | POA: Insufficient documentation

## 2014-10-23 DIAGNOSIS — L821 Other seborrheic keratosis: Secondary | ICD-10-CM | POA: Insufficient documentation

## 2014-10-23 DIAGNOSIS — E890 Postprocedural hypothyroidism: Secondary | ICD-10-CM | POA: Insufficient documentation

## 2014-10-23 DIAGNOSIS — K279 Peptic ulcer, site unspecified, unspecified as acute or chronic, without hemorrhage or perforation: Secondary | ICD-10-CM | POA: Insufficient documentation

## 2014-10-23 DIAGNOSIS — F329 Major depressive disorder, single episode, unspecified: Secondary | ICD-10-CM | POA: Insufficient documentation

## 2014-10-23 MED ORDER — POTASSIUM CHLORIDE CRYS ER 20 MEQ PO TBCR: 20.0000 meq | EXTENDED_RELEASE_TABLET | Freq: Every day | ORAL | Status: DC

## 2014-10-23 NOTE — Telephone Encounter (Signed)
RX sent to pharmacy. Patient is aware.

## 2014-10-26 ENCOUNTER — Encounter: Payer: Self-pay | Admitting: General Surgery

## 2014-10-26 ENCOUNTER — Ambulatory Visit (INDEPENDENT_AMBULATORY_CARE_PROVIDER_SITE_OTHER): Payer: Commercial Managed Care - HMO | Admitting: General Surgery

## 2014-10-26 VITALS — BP 132/74 | HR 72 | Resp 12 | Ht 66.0 in | Wt 172.0 lb

## 2014-10-26 DIAGNOSIS — C180 Malignant neoplasm of cecum: Secondary | ICD-10-CM

## 2014-10-26 NOTE — Progress Notes (Signed)
Patient ID: Melissa Fitzgerald, female   DOB: Jun 16, 1945, 69 y.o.   MRN: [id number]  Chief Complaint  Patient presents with  . Procedure    port removal    HPI Melissa Fitzgerald is a 69 y.o. female here today for a port removal. Dr. Oliva Bustard from oncology has felt her treatment is complete. Recent colonoscopy was also normal. HPI  Past Medical History  Diagnosis Date  . Irritable bowel disease   . Colon polyp   . Anemia   . Thyroid disease   . Hyperlipidemia   . Kidney stone     Past Surgical History  Procedure Laterality Date  . Colonoscopy  09-13-13    Dr [name]  . Tonsillectomy    . Kidney stone surgery    . Thyroid surgery    . Foot surgery    . Nasal sinus surgery    . Abdominal hysterectomy    . Cholecystectomy  2000  . Portacath placement  10/24/13  . Breast enhancement surgery    . Colonoscopy with propofol N/A 10/18/2014    Procedure: COLONOSCOPY WITH PROPOFOL;  Surgeon: [name], MD;  Location: [facility] ENDOSCOPY;  Service: Endoscopy;  Laterality: N/A;    Family History  Problem Relation Age of Onset  . Colon cancer      first cousin  . Breast cancer Maternal Aunt   . Breast cancer Paternal Aunt   . Heart disease Mother   . Diabetes Mother   . Arthritis Sister   . Hyperlipidemia Sister   . COPD Brother   . Diabetes Sister     Social History History  Substance Use Topics  . Smoking status: Current Some Day Smoker -- 0.25 packs/day    Types: Cigarettes  . Smokeless tobacco: Not on file     Comment: still smoking every now and then, rarely one  . Alcohol Use: 0.0 oz/week    0 Standard drinks or equivalent per week     Comment: occasionally    Allergies  Allergen Reactions  . Codeine Nausea And Vomiting  . Oxycodone Nausea And Vomiting  . Penicillins Nausea And Vomiting    Current Outpatient Prescriptions  Medication Sig Dispense Refill  . esomeprazole (NEXIUM) 40 MG capsule Take 40 mg by mouth daily at 12 noon.    . Ferrous Fumarate 324  (106 FE) MG TABS Take 1 tablet by mouth daily. 30 tablet 3  . Hydrocodone-Acetaminophen 7.5-300 MG TABS Take 1 tablet by mouth 4 (four) times daily as needed.    Marland Kitchen levothyroxine (SYNTHROID, LEVOTHROID) 100 MCG tablet Take 1 tablet (100 mcg total) by mouth daily before breakfast. 30 tablet 2  . metoprolol succinate (TOPROL-XL) 25 MG 24 hr tablet Take 25 mg by mouth daily.    . polyethylene glycol powder (GLYCOLAX/MIRALAX) powder Take 255 g by mouth once. 255 g 0  . polyethylene glycol-electrolytes (NULYTELY/GOLYTELY) 420 G solution one bottle for colonoscopy prep 4000 mL 0  . potassium chloride SA (K-DUR,KLOR-CON) 20 MEQ tablet Take 1 tablet (20 mEq total) by mouth daily. 30 tablet 0  . promethazine (PHENERGAN) 12.5 MG tablet take 1 tablet by mouth every 6 hours for nausea and vomiting  0  . traMADol (ULTRAM) 50 MG tablet 1 tablet. 1-2 times a day for pain  0  . traZODone (DESYREL) 50 MG tablet Take 1 tablet by mouth at bedtime.    Marland Kitchen zolpidem (AMBIEN) 5 MG tablet Take 1 tablet (5 mg total) by mouth at bedtime as needed for  sleep. 30 tablet 0   No current facility-administered medications for this visit.    Review of Systems Review of Systems  Constitutional: Negative.   Respiratory: Negative.   Cardiovascular: Negative.     Blood pressure 132/74, pulse 72, resp. rate 12, height 5\' 6"  (1.676 m), weight 172 lb (78.019 kg).  Physical Exam Physical Exam  Data Reviewed None  Assessment    CA colon.     Plan    Procedure Note: Patient placed supine on the procedure table. Area was infiltrated 10 mL of 1% Xylocaine and 0.5% Marcaine.  Area prepped with chloraprep and draped.  Previous incision was reopened with a 15 blade.  Subcutaneous tissue was exposed. The capsule surrounding the port was opened with scalpel. 3 tethering sutures of prolene were removed. Port was removed with pressure applied over the entrant site of the subclavian vein. No significant bleeding encounterd.  Subcutaneous tissue closed with 3.0 vicryl. Skin closed with subcuticular 4.0 vicryl.  Steristrips, telfa, and tegaderm placed.  Procedure was well tolerated with minimal discomfort.  Rx given for Tramadol 50mg  #20 1 q6h prn.       PCP:  Olena Heckle 10/26/2014, 4:43 PM

## 2014-10-30 ENCOUNTER — Telehealth: Payer: Self-pay | Admitting: *Deleted

## 2014-10-30 NOTE — Telephone Encounter (Signed)
Patient had port removed on Thursday and wants to know how to keep the port site clean

## 2014-10-30 NOTE — Telephone Encounter (Signed)
Spoke with patient and advised her she does not need any treatment to the area. She may wash with mild soap and water and pat dry. Patient advised to contact us with any further questions.

## 2014-11-15 ENCOUNTER — Other Ambulatory Visit: Payer: Self-pay | Admitting: Family Medicine

## 2014-11-15 DIAGNOSIS — I10 Essential (primary) hypertension: Secondary | ICD-10-CM

## 2014-11-15 MED ORDER — METOPROLOL SUCCINATE ER 25 MG PO TB24: 25.0000 mg | ORAL_TABLET | Freq: Every day | ORAL | Status: DC

## 2014-11-15 NOTE — Telephone Encounter (Signed)
This is a pt of [facility]'s .  Next ov appointment is on 11/23/2014.

## 2014-11-15 NOTE — Telephone Encounter (Signed)
Pt contacted office for refill request on the following medications:  metoprolol succinate (TOPROL-XL) 25 MG.  [place]  CB#[phone]/MW  This is a [employer] pt.

## 2014-11-17 ENCOUNTER — Ambulatory Visit: Payer: Self-pay | Admitting: Family Medicine

## 2014-11-23 ENCOUNTER — Ambulatory Visit (INDEPENDENT_AMBULATORY_CARE_PROVIDER_SITE_OTHER): Payer: Commercial Managed Care - HMO | Admitting: Family Medicine

## 2014-11-23 ENCOUNTER — Encounter: Payer: Self-pay | Admitting: Family Medicine

## 2014-11-23 VITALS — BP 140/90 | HR 75 | Temp 98.0°F | Resp 16 | Wt 176.4 lb

## 2014-11-23 DIAGNOSIS — C189 Malignant neoplasm of colon, unspecified: Secondary | ICD-10-CM

## 2014-11-23 DIAGNOSIS — E890 Postprocedural hypothyroidism: Secondary | ICD-10-CM

## 2014-11-23 DIAGNOSIS — D509 Iron deficiency anemia, unspecified: Secondary | ICD-10-CM | POA: Diagnosis not present

## 2014-11-23 DIAGNOSIS — I10 Essential (primary) hypertension: Secondary | ICD-10-CM | POA: Diagnosis not present

## 2014-11-23 DIAGNOSIS — F329 Major depressive disorder, single episode, unspecified: Secondary | ICD-10-CM

## 2014-11-23 DIAGNOSIS — F32A Depression, unspecified: Secondary | ICD-10-CM

## 2014-11-23 NOTE — Progress Notes (Signed)
Patient ID: Melissa Fitzgerald, female   DOB: 1945-05-31, 69 y.o.   MRN: [id number]       Patient: Melissa Fitzgerald Female    DOB: Apr 01, 1945   69 y.o.   MRN: [id number] Visit Date: 11/23/2014  Today's Provider: Vernie Murders, PA   Chief Complaint  Patient presents with  . Depression    follow-up, last ov 10/10/14  . Fatigue    malaise and fatigue. follow-up  . Hypothyroidism    follow-up, TSH las were 0.317. pt now on Lecothyroxine 182mcg  . hypokalemia    follow-up, K+ level was 3.3. started her on potassium po 20 emq  . iron dificiency    follow-up   Subjective:    Depression        This is a chronic problem.  The problem has been rapidly improving (better after taking the thyroid and iron medications" pt stated) since onset.  Associated symptoms include fatigue, insomnia, irritable, restlessness, myalgias and headaches.  Associated symptoms include no helplessness, no hopelessness, no appetite change, not sad and no suicidal ideas.  Past treatments include nothing.  Past medical history includes hypothyroidism.     Pertinent negatives include no anxiety.  Pt also stated that she has abdominal pain some times where the surgery was done. She also states that she had a colonoscopy on 10-18-14 by Dr. Jamal Collin and has not heard any results at all.    Past Medical History  Diagnosis Date  . Irritable bowel disease   . Colon polyp   . Anemia   . Thyroid disease   . Hyperlipidemia   . Kidney stone    Patient Active Problem List   Diagnosis Date Noted  . 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 Anemia, iron deficiency 10/23/2014  . HLD (hyperlipidemia) 10/23/2014  . Essential (primary) hypertension 10/23/2014  . Acid reflux 10/23/2014  . Clinical depression 10/23/2014  . Peptic ulcer 10/23/2014  . Hypothyroidism, postop 10/23/2014  . Anemia 10/18/2014  . Arthritis 10/18/2014  . Chronic constipation 10/18/2014  . Sleep  disturbance 10/18/2014  . Elevated liver enzymes 10/18/2014  . Chronic headache 10/18/2014  . History of renal calculi 10/18/2014  . Malignant neoplasm of cecum 10/17/2013  . Cancer of colon 09/21/2013   Past Surgical History  Procedure Laterality Date  . Colonoscopy  09-13-13    Dr [name]  . Tonsillectomy    . Kidney stone surgery    . Thyroid surgery    . Foot surgery    . Nasal sinus surgery    . Abdominal hysterectomy    . Cholecystectomy  2000  . Portacath placement  10/24/13  . Breast enhancement surgery    . Colonoscopy with propofol N/A 10/18/2014    Procedure: COLONOSCOPY WITH PROPOFOL;  Surgeon: [name], MD;  Location: [facility] ENDOSCOPY;  Service: Endoscopy;  Laterality: N/A;    Allergies  Allergen Reactions  . Codeine Nausea And Vomiting  . Oxycodone Nausea And Vomiting  . Penicillins Nausea And Vomiting  . Tramadol Nausea And Vomiting   Previous Medications   ESOMEPRAZOLE (NEXIUM) 40 MG CAPSULE    Take 40 mg by mouth daily at 12 noon.   FERROUS FUMARATE 324 (106 FE) MG TABS    Take 1 tablet by mouth daily.   LEVOTHYROXINE (SYNTHROID, LEVOTHROID) 100 MCG TABLET    Take 1 tablet (100 mcg total) by mouth daily before breakfast.   METOPROLOL SUCCINATE (TOPROL-XL) 25 MG 24 HR  TABLET    Take 1 tablet (25 mg total) by mouth daily.   POLYETHYLENE GLYCOL POWDER (GLYCOLAX/MIRALAX) POWDER    Take 255 g by mouth once.   POLYETHYLENE GLYCOL-ELECTROLYTES (NULYTELY/GOLYTELY) 420 G SOLUTION    one bottle for colonoscopy prep   POTASSIUM CHLORIDE SA (K-DUR,KLOR-CON) 20 MEQ TABLET    Take 1 tablet (20 mEq total) by mouth daily.   PROMETHAZINE (PHENERGAN) 12.5 MG TABLET    take 1 tablet by mouth every 6 hours for nausea and vomiting   TRAZODONE (DESYREL) 50 MG TABLET    Take 1 tablet by mouth at bedtime.   ZOLPIDEM (AMBIEN) 5 MG TABLET    Take 1 tablet (5 mg total) by mouth at bedtime as needed for sleep.    Review of Systems  Constitutional: Positive for fatigue.  Negative for appetite change.  Gastrointestinal:       Occasional soreness near the colon resection scar.  Musculoskeletal: Positive for myalgias.  Neurological: Positive for headaches.       "Headache every day since a child."  Psychiatric/Behavioral: Positive for depression. Negative for suicidal ideas. The patient has insomnia.     Social History  Substance Use Topics  . Smoking status: Current Some Day Smoker -- 0.25 packs/day    Types: Cigarettes  . Smokeless tobacco: Not on file     Comment: still smoking every now and then, rarely one  . Alcohol Use: 0.0 oz/week    0 Standard drinks or equivalent per week     Comment: occasionally   Objective:   BP 140/90 mmHg  Pulse 75  Temp(Src) 98 F (36.7 C) (Oral)  Resp 16  Wt 176 lb 6.4 oz (80.015 kg)  SpO2 97%  Physical Exam  Constitutional: She is oriented to person, place, and time. She appears well-developed and well-nourished. She is irritable. No distress.  HENT:  Head: Normocephalic and atraumatic.  Right Ear: Hearing normal.  Left Ear: Hearing normal.  Nose: Nose normal.  Eyes: Conjunctivae and lids are normal. Right eye exhibits no discharge. Left eye exhibits no discharge. No scleral icterus.  Pulmonary/Chest: Effort normal. No respiratory distress.  Musculoskeletal: Normal range of motion.  Neurological: She is alert and oriented to person, place, and time.  Skin: Skin is intact. No lesion and no rash noted.  Psychiatric: She has a normal mood and affect. Her speech is normal and behavior is normal. Thought content normal.      Assessment & Plan:      1. Clinical depression Feeling better since starting thyroid supplement. Sleeping well with occasional use of Zolpidem.   2. Anemia, iron deficiency Last Hgb on 10-10-14 was 10.6. Tolerating iron supplement without constipation. Will recheck CBC and iron level. - Iron - CBC with Differential/Platelet  3. Hypothyroidism, postop Has multiple nodules in thyroid  and Dr. Pryor Ochoa did total thyroidectomy in 2013. Feeling better on Levothyroxine 100 mcg qd (TSH was 0.317 and total T4 was 13.2 on 10-10-14). Will recheck thyroid function tests.  TSH - T4 - T3, free  4. Cancer of colon No recurrence since resection by Dr. Jamal Collin. Still having follow up with Dr. Oliva Bustard regularly. Had port removed from left chest end of July 2016.  5. Essential (primary) hypertension Tolerating Metoprolol qd and has history of hypokalemia (presently on Klorcon 20 meq qd - K+ was 3.3 on 10-10-14) Recheck labs and follow up pending reports. - COMPLETE METABOLIC PANEL WITH GFR       Vernie Murders, PA  [place]

## 2014-11-24 LAB — CBC WITH DIFFERENTIAL/PLATELET
Basophils Absolute: 0 10*3/uL (ref 0.0–0.2)
Basos: 0 %
EOS (ABSOLUTE): 0.1 10*3/uL (ref 0.0–0.4)
Eos: 1 %
Hematocrit: 37.1 % (ref 34.0–46.6)
Hemoglobin: 11.5 g/dL (ref 11.1–15.9)
Immature Grans (Abs): 0 10*3/uL (ref 0.0–0.1)
Immature Granulocytes: 0 %
Lymphocytes Absolute: 1.2 10*3/uL (ref 0.7–3.1)
Lymphs: 22 %
MCH: 29 pg (ref 26.6–33.0)
MCHC: 31 g/dL — ABNORMAL LOW (ref 31.5–35.7)
MCV: 94 fL (ref 79–97)
Monocytes Absolute: 0.3 10*3/uL (ref 0.1–0.9)
Monocytes: 6 %
Neutrophils Absolute: 3.7 10*3/uL (ref 1.4–7.0)
Neutrophils: 71 %
Platelets: 251 10*3/uL (ref 150–379)
RBC: 3.97 x10E6/uL (ref 3.77–5.28)
RDW: 15.2 % (ref 12.3–15.4)
WBC: 5.3 10*3/uL (ref 3.4–10.8)

## 2014-11-24 LAB — IRON: Iron: 105 ug/dL (ref 27–139)

## 2014-11-24 LAB — COMPREHENSIVE METABOLIC PANEL
ALT: 16 IU/L (ref 0–32)
AST: 20 IU/L (ref 0–40)
Albumin/Globulin Ratio: 1.4 (ref 1.1–2.5)
Albumin: 4.6 g/dL (ref 3.6–4.8)
Alkaline Phosphatase: 67 IU/L (ref 39–117)
BUN/Creatinine Ratio: 23 (ref 11–26)
BUN: 17 mg/dL (ref 8–27)
Bilirubin Total: 0.2 mg/dL (ref 0.0–1.2)
CO2: 27 mmol/L (ref 18–29)
Calcium: 9.7 mg/dL (ref 8.7–10.3)
Chloride: 103 mmol/L (ref 97–108)
Creatinine, Ser: 0.74 mg/dL (ref 0.57–1.00)
GFR calc Af Amer: 96 mL/min/{1.73_m2} (ref >59)
GFR calc non Af Amer: 84 mL/min/{1.73_m2} (ref >59)
Globulin, Total: 3.3 g/dL (ref 1.5–4.5)
Glucose: 87 mg/dL (ref 65–99)
Potassium: 4.8 mmol/L (ref 3.5–5.2)
Sodium: 143 mmol/L (ref 134–144)
Total Protein: 7.9 g/dL (ref 6.0–8.5)

## 2014-11-24 LAB — T4: T4, Total: 8.8 ug/dL (ref 4.5–12.0)

## 2014-11-24 LAB — T3, FREE: T3, Free: 2.6 pg/mL (ref 2.0–4.4)

## 2014-11-24 LAB — TSH: TSH: 0.225 u[IU]/mL — ABNORMAL LOW (ref 0.450–4.500)

## 2014-11-28 ENCOUNTER — Ambulatory Visit: Payer: Commercial Managed Care - HMO

## 2014-11-28 ENCOUNTER — Telehealth: Payer: Self-pay

## 2014-11-28 ENCOUNTER — Ambulatory Visit: Payer: Self-pay | Admitting: Family Medicine

## 2014-11-28 NOTE — Telephone Encounter (Signed)
Patient advised as directed below. Told patient that we can go ahead and schedule the recheck appointment today, per patient will call later to schedule the follow up appointment.  Thanks,  -Anaijah Augsburger

## 2014-11-28 NOTE — Telephone Encounter (Signed)
-----   Message from [name], [place] sent at 11/24/2014  5:52 PM EDT ----- All blood tests better. Blood cell counts and iron level back to normal. Thyroid tests essentially normal, also. Recheck in 3-4 months or sooner if needed. Continue present medications and let Dr. Oliva Bustard know these results are in the computer database for him.

## 2014-12-11 ENCOUNTER — Ambulatory Visit (INDEPENDENT_AMBULATORY_CARE_PROVIDER_SITE_OTHER): Payer: Commercial Managed Care - HMO | Admitting: General Surgery

## 2014-12-11 ENCOUNTER — Encounter: Payer: Self-pay | Admitting: General Surgery

## 2014-12-11 VITALS — BP 126/68 | HR 75 | Resp 14 | Ht 66.0 in | Wt 178.0 lb

## 2014-12-11 DIAGNOSIS — Z85038 Personal history of other malignant neoplasm of large intestine: Secondary | ICD-10-CM

## 2014-12-11 DIAGNOSIS — R1011 Right upper quadrant pain: Secondary | ICD-10-CM

## 2014-12-11 NOTE — Progress Notes (Signed)
Patient ID: Melissa Fitzgerald, female   DOB: 08-Feb-1946, 69 y.o.   MRN: [id number]  Chief Complaint  Patient presents with  . Follow-up    Abdominal Pain    HPI Melissa Fitzgerald is a 69 y.o. female  here for evaluation of abdominal pain. She states the pain is located in her right lower and upper quadrant. She describes the pain as burning. She has been having some diarrhea. She has not been eating normal due to the pain. Pain has been ongoing for about a month. She has been paying attention to her stools; denies blood in her stool and denies urinary problems. She takes Nexium for acid reflux. She has completed   chemotherapy for colon cancer few mos ago and her port was removed about 1 mo ago. HPI  Past Medical History  Diagnosis Date  . Irritable bowel disease   . Colon polyp   . Anemia   . Thyroid disease   . Hyperlipidemia   . Kidney stone     Past Surgical History  Procedure Laterality Date  . Colonoscopy  09-13-13    Dr [name]  . Tonsillectomy    . Kidney stone surgery    . Thyroid surgery    . Foot surgery    . Nasal sinus surgery    . Abdominal hysterectomy    . Cholecystectomy  2000  . Portacath placement  10/24/13  . Breast enhancement surgery    . Colonoscopy with propofol N/A 10/18/2014    Procedure: COLONOSCOPY WITH PROPOFOL;  Surgeon: [name], MD;  Location: [facility] ENDOSCOPY;  Service: Endoscopy;  Laterality: N/A;    Family History  Problem Relation Age of Onset  . Colon cancer      first cousin  . Breast cancer Maternal Aunt   . Breast cancer Paternal Aunt   . Heart disease Mother   . Diabetes Mother   . Arthritis Sister   . Hyperlipidemia Sister   . COPD Brother   . Diabetes Sister     Social History Social History  Substance Use Topics  . Smoking status: Current Some Day Smoker -- 0.25 packs/day    Types: Cigarettes  . Smokeless tobacco: None     Comment: still smoking every now and then, rarely one  . Alcohol Use: 0.0 oz/week    0  Standard drinks or equivalent per week     Comment: occasionally    Allergies  Allergen Reactions  . Codeine Nausea And Vomiting  . Oxycodone Nausea And Vomiting  . Penicillins Nausea And Vomiting  . Tramadol Nausea And Vomiting    Current Outpatient Prescriptions  Medication Sig Dispense Refill  . esomeprazole (NEXIUM) 40 MG capsule Take 40 mg by mouth daily at 12 noon.    . Ferrous Fumarate 324 (106 FE) MG TABS Take 1 tablet by mouth daily. 30 tablet 3  . levothyroxine (SYNTHROID, LEVOTHROID) 100 MCG tablet Take 1 tablet (100 mcg total) by mouth daily before breakfast. 30 tablet 2  . metoprolol succinate (TOPROL-XL) 25 MG 24 hr tablet Take 1 tablet (25 mg total) by mouth daily. 30 tablet 5  . polyethylene glycol powder (GLYCOLAX/MIRALAX) powder Take 255 g by mouth once. 255 g 0  . polyethylene glycol-electrolytes (NULYTELY/GOLYTELY) 420 G solution one bottle for colonoscopy prep 4000 mL 0  . potassium chloride SA (K-DUR,KLOR-CON) 20 MEQ tablet Take 1 tablet (20 mEq total) by mouth daily. 30 tablet 0  . promethazine (PHENERGAN) 12.5 MG tablet take 1 tablet by  mouth every 6 hours for nausea and vomiting  0  . traZODone (DESYREL) 50 MG tablet Take 1 tablet by mouth at bedtime.    Marland Kitchen zolpidem (AMBIEN) 5 MG tablet Take 1 tablet (5 mg total) by mouth at bedtime as needed for sleep. 30 tablet 0   No current facility-administered medications for this visit.    Review of Systems Review of Systems  Constitutional: Positive for appetite change. Negative for fever, chills, diaphoresis, activity change, fatigue and unexpected weight change.  Respiratory: Negative.   Cardiovascular: Negative.   Gastrointestinal: Positive for abdominal pain and diarrhea. Negative for blood in stool.    Blood pressure 126/68, pulse 75, resp. rate 14, height 5\' 6"  (1.676 m), weight 178 lb (80.74 kg).  Physical Exam Physical Exam  Constitutional: She appears well-developed and well-nourished.  Eyes:  Conjunctivae are normal.  Neck: Neck supple. No thyromegaly present.  Cardiovascular: Normal rate, regular rhythm and normal heart sounds.   Pulmonary/Chest: Effort normal and breath sounds normal.  Abdominal: Soft. Bowel sounds are normal. There is no hepatomegaly. There is no tenderness. No hernia.    Data Reviewed Prior notes  Assessment    Upper abdominal RUQ pain, intermittent, sometimes made worse with eating. Described as a burning sensation. Endoscopy over a year ago did reveal evidence of esophagitis and gastritis. Patient has had previous cholecystectomy and recent follow-up colonoscopy was normal.      Plan    Upper endoscopy. Discussed fully with pt and she is agreeable.    Patient has been scheduled for an upper endoscopy on 01-03-15 at [facility].    SANKAR,SEEPLAPUTHUR G 12/13/2014, 8:45 AM

## 2014-12-11 NOTE — Progress Notes (Deleted)
69 year old female here for follow up on abdominal pain. She states the pain is located in her right lower and upper quadrant. She describes the pain as burning. She has been having some diarrhea. She has not been eating normal due to the pain. Pain has been ongoing for about a month. She has been paying attention to her stools; denies blood in her stool and denies urinary problems. She takes Nexium for acid reflux. She has completed   chemotherapy for colon cancer few mos ago and her port was removed about 1 mo ago.  Exam shows abdomen is soft and non-tender. Bowel sounds present. Lungs are clear. Heart regular rate and rhythm with no murmurs, rubs, gallops.  Assessment:

## 2014-12-11 NOTE — Patient Instructions (Addendum)
Esophagogastroduodenoscopy Esophagogastroduodenoscopy (EGD) is a procedure to examine the lining of the esophagus, stomach, and first part of the small intestine (duodenum). A long, flexible, lighted tube with a camera attached (endoscope) is inserted down the throat to view these organs. This procedure is done to detect problems or abnormalities, such as inflammation, bleeding, ulcers, or growths, in order to treat them. The procedure lasts about 5-20 minutes. It is usually an outpatient procedure, but it may need to be performed in emergency cases in the hospital. LET YOUR CAREGIVER KNOW ABOUT:   Allergies to food or medicine.  All medicines you are taking, including vitamins, herbs, eyedrops, and over-the-counter medicines and creams.  Use of steroids (by mouth or creams).  Previous problems you or members of your family have had with the use of anesthetics.  Any blood disorders you have.  Previous surgeries you have had.  Other health problems you have.  Possibility of pregnancy, if this applies. RISKS AND COMPLICATIONS  Generally, EGD is a safe procedure. However, as with any procedure, complications can occur. Possible complications include:  Infection.  Bleeding.  Tearing (perforation) of the esophagus, stomach, or duodenum.  Difficulty breathing or not being able to breath.  Excessive sweating.  Spasms of the larynx.  Slowed heartbeat.  Low blood pressure. BEFORE THE PROCEDURE  Do not eat or drink anything for 6-8 hours before the procedure or as directed by your caregiver.  Ask your caregiver about changing or stopping your regular medicines.  If you wear dentures, be prepared to remove them before the procedure.  Arrange for someone to drive you home after the procedure. PROCEDURE   A vein will be accessed to give medicines and fluids. A medicine to relax you (sedative) and a pain reliever will be given through that access into the vein.  A numbing medicine  (local anesthetic) may be sprayed on your throat for comfort and to stop you from gagging or coughing.  A mouth guard may be placed in your mouth to protect your teeth and to keep you from biting on the endoscope.  You will be asked to lie on your left side.  The endoscope is inserted down your throat and into the esophagus, stomach, and duodenum.  Air is put through the endoscope to allow your caregiver to view the lining of your esophagus clearly.  The esophagus, stomach, and duodenum is then examined. During the exam, your caregiver may:  Remove tissue to be examined under a microscope (biopsy) for inflammation, infection, or other medical problems.  Remove growths.  Remove objects (foreign bodies) that are stuck.  Treat any bleeding with medicines or other devices that stop tissues from bleeding (hot cautery, clipping devices).  Widen (dilate) or stretch narrowed areas of the esophagus and stomach.  The endoscope will then be withdrawn. AFTER THE PROCEDURE  You will be taken to a recovery area to be monitored. You will be able to go home once you are stable and alert.  Do not eat or drink anything until the local anesthetic and numbing medicines have worn off. You may choke.  It is normal to feel bloated, have pain with swallowing, or have a sore throat for a short time. This will wear off.  Your caregiver should be able to discuss his or her findings with you. It will take longer to discuss the test results if any biopsies were taken. Document Released: 07/11/2004 Document Revised: 07/25/2013 Document Reviewed: 02/11/2012 [facility] Patient Information 2015 [place], [place]. This information is not  intended to replace advice given to you by your health care provider. Make sure you discuss any questions you have with your health care provider.  Patient has been scheduled for an upper endoscopy on 01-03-15 at [facility].

## 2014-12-20 ENCOUNTER — Telehealth: Payer: Self-pay | Admitting: Family Medicine

## 2014-12-20 ENCOUNTER — Other Ambulatory Visit: Payer: Self-pay

## 2014-12-20 DIAGNOSIS — I10 Essential (primary) hypertension: Secondary | ICD-10-CM

## 2014-12-20 MED ORDER — METOPROLOL SUCCINATE ER 25 MG PO TB24: 25.0000 mg | ORAL_TABLET | Freq: Every day | ORAL | Status: DC

## 2014-12-20 NOTE — Telephone Encounter (Signed)
Please review, LOV with Simona Huh was 11/23/14-aa

## 2014-12-20 NOTE — Telephone Encounter (Signed)
1 month refill okay.

## 2014-12-20 NOTE — Telephone Encounter (Signed)
Pt called for refill on her metoprolol succinate (TOPROL-XL) 25 MG 24 hr tablet  [address] told her she was out of refils'  Thanks teri   Pt says she will run out before he gets back

## 2014-12-27 NOTE — Patient Outreach (Signed)
[facility]) Care Management  12/27/2014  Melissa Fitzgerald Mar 25, 1945 [id number]   Referral from [facility] tier 4 list, assigned to [name], RN for patient outreach.  Kishon Garriga L. Evalyn Shultis, [place] Care Management Assistant

## 2015-01-02 ENCOUNTER — Other Ambulatory Visit: Payer: Self-pay | Admitting: General Surgery

## 2015-01-02 ENCOUNTER — Encounter: Payer: Self-pay | Admitting: *Deleted

## 2015-01-03 ENCOUNTER — Encounter: Payer: Self-pay | Admitting: *Deleted

## 2015-01-03 ENCOUNTER — Ambulatory Visit: Payer: Commercial Managed Care - HMO | Admitting: Anesthesiology

## 2015-01-03 ENCOUNTER — Ambulatory Visit
Admission: RE | Admit: 2015-01-03 | Discharge: 2015-01-03 | Disposition: A | Discharge status: Home / Self Care | Payer: Commercial Managed Care - HMO | Source: Ambulatory Visit | Attending: General Surgery | Admitting: General Surgery

## 2015-01-03 ENCOUNTER — Encounter
Admission: RE | Disposition: A | Discharge status: Home / Self Care | Payer: Self-pay | Source: Ambulatory Visit | Attending: General Surgery

## 2015-01-03 DIAGNOSIS — Z79899 Other long term (current) drug therapy: Secondary | ICD-10-CM | POA: Diagnosis not present

## 2015-01-03 DIAGNOSIS — I10 Essential (primary) hypertension: Secondary | ICD-10-CM | POA: Insufficient documentation

## 2015-01-03 DIAGNOSIS — R1011 Right upper quadrant pain: Secondary | ICD-10-CM | POA: Insufficient documentation

## 2015-01-03 DIAGNOSIS — K2901 Acute gastritis with bleeding: Secondary | ICD-10-CM | POA: Diagnosis not present

## 2015-01-03 DIAGNOSIS — Z8711 Personal history of peptic ulcer disease: Secondary | ICD-10-CM | POA: Diagnosis not present

## 2015-01-03 DIAGNOSIS — Z86010 Personal history of colonic polyps: Secondary | ICD-10-CM | POA: Insufficient documentation

## 2015-01-03 DIAGNOSIS — R51 Headache: Secondary | ICD-10-CM | POA: Diagnosis not present

## 2015-01-03 DIAGNOSIS — Z85038 Personal history of other malignant neoplasm of large intestine: Secondary | ICD-10-CM | POA: Diagnosis not present

## 2015-01-03 DIAGNOSIS — K2950 Unspecified chronic gastritis without bleeding: Secondary | ICD-10-CM | POA: Diagnosis not present

## 2015-01-03 DIAGNOSIS — F17210 Nicotine dependence, cigarettes, uncomplicated: Secondary | ICD-10-CM | POA: Diagnosis not present

## 2015-01-03 DIAGNOSIS — R1013 Epigastric pain: Secondary | ICD-10-CM | POA: Insufficient documentation

## 2015-01-03 DIAGNOSIS — Z9221 Personal history of antineoplastic chemotherapy: Secondary | ICD-10-CM | POA: Diagnosis not present

## 2015-01-03 DIAGNOSIS — Z880 Allergy status to penicillin: Secondary | ICD-10-CM | POA: Insufficient documentation

## 2015-01-03 DIAGNOSIS — Z885 Allergy status to narcotic agent status: Secondary | ICD-10-CM | POA: Diagnosis not present

## 2015-01-03 DIAGNOSIS — E079 Disorder of thyroid, unspecified: Secondary | ICD-10-CM | POA: Insufficient documentation

## 2015-01-03 DIAGNOSIS — R1031 Right lower quadrant pain: Secondary | ICD-10-CM | POA: Diagnosis not present

## 2015-01-03 DIAGNOSIS — E785 Hyperlipidemia, unspecified: Secondary | ICD-10-CM | POA: Diagnosis not present

## 2015-01-03 DIAGNOSIS — R197 Diarrhea, unspecified: Secondary | ICD-10-CM | POA: Diagnosis not present

## 2015-01-03 DIAGNOSIS — K219 Gastro-esophageal reflux disease without esophagitis: Secondary | ICD-10-CM | POA: Diagnosis not present

## 2015-01-03 DIAGNOSIS — D649 Anemia, unspecified: Secondary | ICD-10-CM | POA: Insufficient documentation

## 2015-01-03 DIAGNOSIS — K2970 Gastritis, unspecified, without bleeding: Secondary | ICD-10-CM | POA: Diagnosis not present

## 2015-01-03 HISTORY — DX: Essential (primary) hypertension: I10

## 2015-01-03 HISTORY — PX: ESOPHAGOGASTRODUODENOSCOPY (EGD) WITH PROPOFOL: SHX5813

## 2015-01-03 SURGERY — ESOPHAGOGASTRODUODENOSCOPY (EGD) WITH PROPOFOL
Anesthesia: General

## 2015-01-03 MED ORDER — PROPOFOL 10 MG/ML IV BOLUS
INTRAVENOUS | Status: DC | PRN
Start: 2015-01-03 — End: 2015-01-03
  Administered 2015-01-03: 50 mg via INTRAVENOUS

## 2015-01-03 MED ORDER — FENTANYL CITRATE (PF) 100 MCG/2ML IJ SOLN: 25.0000 ug | INTRAMUSCULAR | Status: DC | PRN

## 2015-01-03 MED ORDER — SODIUM CHLORIDE 0.9 % IV SOLN
INTRAVENOUS | Status: DC
  Administered 2015-01-03: 1000 mL via INTRAVENOUS

## 2015-01-03 MED ORDER — LIDOCAINE HCL (CARDIAC) 20 MG/ML IV SOLN
INTRAVENOUS | Status: DC | PRN
  Administered 2015-01-03: 60 mg via INTRAVENOUS

## 2015-01-03 MED ORDER — GLYCOPYRROLATE 0.2 MG/ML IJ SOLN
INTRAMUSCULAR | Status: DC | PRN
  Administered 2015-01-03: 0.3 mg via INTRAVENOUS

## 2015-01-03 MED ORDER — FENTANYL CITRATE (PF) 100 MCG/2ML IJ SOLN
INTRAMUSCULAR | Status: DC | PRN
  Administered 2015-01-03: 50 ug via INTRAVENOUS

## 2015-01-03 MED ORDER — ONDANSETRON HCL 4 MG/2ML IJ SOLN
4.0000 mg | Freq: Once | INTRAMUSCULAR | Status: DC | PRN
Start: 2015-01-03 — End: 2015-01-03

## 2015-01-03 MED ORDER — PROPOFOL 500 MG/50ML IV EMUL
INTRAVENOUS | Status: DC | PRN
  Administered 2015-01-03: 200 ug/kg/min via INTRAVENOUS

## 2015-01-03 NOTE — Op Note (Signed)
[facility] Gastroenterology Patient Name: Melissa Fitzgerald Procedure Date: 01/03/2015 8:56 AM MRN: [id number] Account #: [id number] Date of Birth: Oct 16, 1945 Admit Type: Outpatient Age: 69 Room: [facility] ENDO ROOM 1 Gender: Female Note Status: Finalized Procedure:         Upper GI endoscopy Indications:       Epigastric abdominal pain, Abdominal pain in the right                     upper quadrant Providers:         Orlie Pollen, MD Referring MD:      Vickki Muff. Chrismon, MD (Referring MD) Medicines:         General Anesthesia Complications:     No immediate complications. Procedure:         Pre-Anesthesia Assessment:                    - General anesthesia was determined to be medically                     necessary for this procedure based on review of the                     patient's medical history, medications, and prior                     anesthesia history.                    After obtaining informed consent, the endoscope was passed                     under direct vision. Throughout the procedure, the                     patient's blood pressure, pulse, and oxygen saturations                     were monitored continuously. The Olympus GIF-160 endoscope                     (S#. [id number]) was introduced through the mouth, and                     advanced to the third part of duodenum. The upper GI                     endoscopy was accomplished with ease. The patient                     tolerated the procedure well. Findings:      The esophagus was normal.      The examined duodenum was normal.      Diffuse moderate inflammation characterized by erosions and erythema was       found in the gastric body. Biopsies were taken with a cold forceps for       histology. Verification of patient identification for the specimen was       done using the patient's name and birth date.      The exam was otherwise without abnormality. Impression:        - Normal  esophagus.                    - Normal examined duodenum.                    -  Gastritis. Biopsied.                    - The examination was otherwise normal. Recommendation:    - Await pathology results. Procedure Code(s): --- Professional ---                    [id number], Esophagogastroduodenoscopy, flexible, transoral;                     with biopsy, single or multiple Diagnosis Code(s): --- Professional ---                    K29.70, Gastritis, unspecified, without bleeding                    R10.13, Epigastric pain                    R10.11, Right upper quadrant pain CPT copyright 2014 American Medical Association. All rights reserved. The codes documented in this report are preliminary and upon coder review may  be revised to meet current compliance requirements. Orlie Pollen, MD 01/03/2015 9:19:50 AM This report has been signed electronically. Number of Addenda: 0 Note Initiated On: 01/03/2015 8:56 AM      [facility]

## 2015-01-03 NOTE — H&P (View-Only) (Signed)
Patient ID: Melissa Fitzgerald, female   DOB: 1945/05/01, 69 y.o.   MRN: [id number]  Chief Complaint  Patient presents with  . Follow-up    Abdominal Pain    HPI Melissa Fitzgerald is a 69 y.o. female  here for evaluation of abdominal pain. She states the pain is located in her right lower and upper quadrant. She describes the pain as burning. She has been having some diarrhea. She has not been eating normal due to the pain. Pain has been ongoing for about a month. She has been paying attention to her stools; denies blood in her stool and denies urinary problems. She takes Nexium for acid reflux. She has completed   chemotherapy for colon cancer few mos ago and her port was removed about 1 mo ago. HPI  Past Medical History  Diagnosis Date  . Irritable bowel disease   . Colon polyp   . Anemia   . Thyroid disease   . Hyperlipidemia   . Kidney stone     Past Surgical History  Procedure Laterality Date  . Colonoscopy  09-13-13    Dr [name]  . Tonsillectomy    . Kidney stone surgery    . Thyroid surgery    . Foot surgery    . Nasal sinus surgery    . Abdominal hysterectomy    . Cholecystectomy  2000  . Portacath placement  10/24/13  . Breast enhancement surgery    . Colonoscopy with propofol N/A 10/18/2014    Procedure: COLONOSCOPY WITH PROPOFOL;  Surgeon: [name], MD;  Location: [facility] ENDOSCOPY;  Service: Endoscopy;  Laterality: N/A;    Family History  Problem Relation Age of Onset  . Colon cancer      first cousin  . Breast cancer Maternal Aunt   . Breast cancer Paternal Aunt   . Heart disease Mother   . Diabetes Mother   . Arthritis Sister   . Hyperlipidemia Sister   . COPD Brother   . Diabetes Sister     Social History Social History  Substance Use Topics  . Smoking status: Current Some Day Smoker -- 0.25 packs/day    Types: Cigarettes  . Smokeless tobacco: None     Comment: still smoking every now and then, rarely one  . Alcohol Use: 0.0 oz/week    0  Standard drinks or equivalent per week     Comment: occasionally    Allergies  Allergen Reactions  . Codeine Nausea And Vomiting  . Oxycodone Nausea And Vomiting  . Penicillins Nausea And Vomiting  . Tramadol Nausea And Vomiting    Current Outpatient Prescriptions  Medication Sig Dispense Refill  . esomeprazole (NEXIUM) 40 MG capsule Take 40 mg by mouth daily at 12 noon.    . Ferrous Fumarate 324 (106 FE) MG TABS Take 1 tablet by mouth daily. 30 tablet 3  . levothyroxine (SYNTHROID, LEVOTHROID) 100 MCG tablet Take 1 tablet (100 mcg total) by mouth daily before breakfast. 30 tablet 2  . metoprolol succinate (TOPROL-XL) 25 MG 24 hr tablet Take 1 tablet (25 mg total) by mouth daily. 30 tablet 5  . polyethylene glycol powder (GLYCOLAX/MIRALAX) powder Take 255 g by mouth once. 255 g 0  . polyethylene glycol-electrolytes (NULYTELY/GOLYTELY) 420 G solution one bottle for colonoscopy prep 4000 mL 0  . potassium chloride SA (K-DUR,KLOR-CON) 20 MEQ tablet Take 1 tablet (20 mEq total) by mouth daily. 30 tablet 0  . promethazine (PHENERGAN) 12.5 MG tablet take 1 tablet by  mouth every 6 hours for nausea and vomiting  0  . traZODone (DESYREL) 50 MG tablet Take 1 tablet by mouth at bedtime.    Marland Kitchen zolpidem (AMBIEN) 5 MG tablet Take 1 tablet (5 mg total) by mouth at bedtime as needed for sleep. 30 tablet 0   No current facility-administered medications for this visit.    Review of Systems Review of Systems  Constitutional: Positive for appetite change. Negative for fever, chills, diaphoresis, activity change, fatigue and unexpected weight change.  Respiratory: Negative.   Cardiovascular: Negative.   Gastrointestinal: Positive for abdominal pain and diarrhea. Negative for blood in stool.    Blood pressure 126/68, pulse 75, resp. rate 14, height 5\' 6"  (1.676 m), weight 178 lb (80.74 kg).  Physical Exam Physical Exam  Constitutional: She appears well-developed and well-nourished.  Eyes:  Conjunctivae are normal.  Neck: Neck supple. No thyromegaly present.  Cardiovascular: Normal rate, regular rhythm and normal heart sounds.   Pulmonary/Chest: Effort normal and breath sounds normal.  Abdominal: Soft. Bowel sounds are normal. There is no hepatomegaly. There is no tenderness. No hernia.    Data Reviewed Prior notes  Assessment    Upper abdominal RUQ pain, intermittent, sometimes made worse with eating. Described as a burning sensation. Endoscopy over a year ago did reveal evidence of esophagitis and gastritis. Patient has had previous cholecystectomy and recent follow-up colonoscopy was normal.      Plan    Upper endoscopy. Discussed fully with pt and she is agreeable.    Patient has been scheduled for an upper endoscopy on 01-03-15 at [facility].    SANKAR,SEEPLAPUTHUR G 12/13/2014, 8:45 AM

## 2015-01-03 NOTE — Anesthesia Preprocedure Evaluation (Signed)
Anesthesia Evaluation  Patient identified by MRN, date of birth, ID band Patient awake    Reviewed: Allergy & Precautions, H&P , NPO status , Patient's Chart, lab work & pertinent test results, reviewed documented beta blocker date and time   Airway Mallampati: II  TM Distance: >3 FB Neck ROM: full    Dental no notable dental hx.    Pulmonary neg pulmonary ROS, Current Smoker,    Pulmonary exam normal breath sounds clear to auscultation       Cardiovascular Exercise Tolerance: Good hypertension, negative cardio ROS   Rhythm:regular Rate:Normal     Neuro/Psych  Headaches, negative neurological ROS  negative psych ROS   GI/Hepatic negative GI ROS, Neg liver ROS, PUD, GERD  ,  Endo/Other  negative endocrine ROSHypothyroidism   Renal/GU Renal diseasenegative Renal ROS  negative genitourinary   Musculoskeletal   Abdominal   Peds  Hematology negative hematology ROS (+) anemia ,   Anesthesia Other Findings   Reproductive/Obstetrics negative OB ROS                             Anesthesia Physical Anesthesia Plan  ASA: III  Anesthesia Plan: General   Post-op Pain Management:    Induction:   Airway Management Planned:   Additional Equipment:   Intra-op Plan:   Post-operative Plan:   Informed Consent: I have reviewed the patients History and Physical, chart, labs and discussed the procedure including the risks, benefits and alternatives for the proposed anesthesia with the patient or authorized representative who has indicated his/her understanding and acceptance.   Dental Advisory Given  Plan Discussed with: CRNA  Anesthesia Plan Comments:         Anesthesia Quick Evaluation

## 2015-01-03 NOTE — Interval H&P Note (Signed)
History and Physical Interval Note:  01/03/2015 8:06 AM  Melissa Fitzgerald  has presented today for surgery, with the diagnosis of RUQ PAIN  The various methods of treatment have been discussed with the patient and family. After consideration of risks, benefits and other options for treatment, the patient has consented to  Procedure(s): ESOPHAGOGASTRODUODENOSCOPY (EGD) WITH PROPOFOL (N/A) as a surgical intervention .  The patient's history has been reviewed, patient examined, no change in status, stable for surgery.  I have reviewed the patient's chart and labs.  Questions were answered to the patient's satisfaction.     Harout Scheurich G

## 2015-01-03 NOTE — Transfer of Care (Signed)
Immediate Anesthesia Transfer of Care Note  Patient: Melissa Fitzgerald  Procedure(s) Performed: Procedure(s): ESOPHAGOGASTRODUODENOSCOPY (EGD) WITH PROPOFOL (N/A)  Patient Location: PACU  Anesthesia Type:General  Level of Consciousness: awake, alert  and oriented  Airway & Oxygen Therapy: Patient Spontanous Breathing and Patient connected to nasal cannula oxygen  Post-op Assessment: Report given to RN and Post -op Vital signs reviewed and stable  Post vital signs: stable  Last Vitals:  Filed Vitals:   01/03/15 0924  BP: 124/83  Pulse: 73  Temp: 35.8 C  Resp: 13    Complications: No apparent anesthesia complications

## 2015-01-04 ENCOUNTER — Other Ambulatory Visit: Payer: Self-pay | Admitting: *Deleted

## 2015-01-04 DIAGNOSIS — I159 Secondary hypertension, unspecified: Secondary | ICD-10-CM

## 2015-01-04 LAB — SURGICAL PATHOLOGY

## 2015-01-04 NOTE — Anesthesia Postprocedure Evaluation (Signed)
  Anesthesia Post-op Note  Patient: Melissa Fitzgerald  Procedure(s) Performed: Procedure(s): ESOPHAGOGASTRODUODENOSCOPY (EGD) WITH PROPOFOL (N/A)  Anesthesia type:General  Patient location: PACU  Post pain: Pain level controlled  Post assessment: Post-op Vital signs reviewed, Patient's Cardiovascular Status Stable, Respiratory Function Stable, Patent Airway and No signs of Nausea or vomiting  Post vital signs: Reviewed and stable  Last Vitals:  Filed Vitals:   01/03/15 0950  BP: 150/95  Pulse: 74  Temp:   Resp: 18    Level of consciousness: awake, alert  and patient cooperative  Complications: No apparent anesthesia complications

## 2015-01-04 NOTE — Patient Outreach (Signed)
[facility]) Care Management  01/04/2015  ANISTON CHRISTMAN September 04, 1945 [id number]  RN Health Coach telephone call to Tier 4 screening. Hip[employer] compliance verified.  Patient has history of Hypertension and colon cancer. Patient has fallen once  2-3 weeks ago. Per patient her balance is a little off since she was on her chemotherapy. Last chemotherapy treatment was 03/2014. Patient takes medication as prescribed. Patient main complaint is fair eating habits and pain in stomach. Patient stated the nexium made her stomach hurt worse. Patient had an EGD on 01/03/2015.  Patient stated that some of the medication from the EGD makes her a little nauseated afterwards. Patient stated she is having some depression. Per patient the depression is not all the time just sometimes.  Patient would like some information on her Hypertension, Eating Healthy and depression.   Plan:  Patient needs referral for care coordination of depression and has hx of cancer and hypertension  [place] Management [phone]

## 2015-01-05 ENCOUNTER — Telehealth: Payer: Self-pay

## 2015-01-05 NOTE — Telephone Encounter (Signed)
Patient advised as directed below. Patent states that she is continuing to have headaches. Patient states that Dr, Jamal Collin had told her not to take any medication with Aspirin. Patient wants to know what she can take to help with the chronic headaches. Patient states Tylenol does not help. [place]  Please advise.

## 2015-01-05 NOTE — Telephone Encounter (Signed)
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 Follow up with Dr. Jamal Collin as needed.

## 2015-01-05 NOTE — Telephone Encounter (Signed)
LMTCB

## 2015-01-05 NOTE — Patient Outreach (Signed)
[name]) Care Management  01/05/2015  Melissa Fitzgerald 01/20/1946 [id number]   Request from [name], RN to assign Community RN, assign [name], RN.  Thanks, [name]. [place], [place] Assistant Phone: [phone] Fax: [phone]

## 2015-01-05 NOTE — Telephone Encounter (Signed)
Patient called back and wanted to know if she needed to continue taking Nexium? If so she will need a refill sent to the pharmacy also.

## 2015-01-08 MED ORDER — ESOMEPRAZOLE MAGNESIUM 40 MG PO CPDR: 40.0000 mg | DELAYED_RELEASE_CAPSULE | Freq: Every day | ORAL | Status: DC

## 2015-01-08 NOTE — Telephone Encounter (Signed)
With biopsy of stomach showing erosive gastritis, recommend she continue the Nexium and sent refill to the [facility]. Should not use any NSAID's or ASA because they can worsen the gastritis. Tylenol or Percogesic are OK to use.

## 2015-01-08 NOTE — Telephone Encounter (Signed)
Patient states Tylenol provides no relief.

## 2015-01-09 NOTE — Telephone Encounter (Signed)
Patient advised as directed below. Patient verbalized understanding.  

## 2015-01-09 NOTE — Telephone Encounter (Signed)
May use the Percogesic or Tylenol-PM for headache with a caffeine drink. May need recheck appointment to rule out any sinus infection if headache persists.

## 2015-01-09 NOTE — Telephone Encounter (Signed)
Patient advised Nexium has been sent to the pharmacy. Patient states she is going to try and find a OTC medication that is aspirin free to help with the headache, but if Simona Huh has a recommendation to call her back.

## 2015-01-10 ENCOUNTER — Encounter: Payer: Self-pay | Admitting: General Surgery

## 2015-01-16 ENCOUNTER — Inpatient Hospital Stay: Payer: Commercial Managed Care - HMO

## 2015-01-16 ENCOUNTER — Inpatient Hospital Stay: Payer: Commercial Managed Care - HMO | Attending: Oncology | Admitting: Oncology

## 2015-01-16 ENCOUNTER — Encounter: Payer: Self-pay | Admitting: Oncology

## 2015-01-16 ENCOUNTER — Other Ambulatory Visit: Payer: Self-pay | Admitting: *Deleted

## 2015-01-16 VITALS — BP 160/104 | HR 75 | Temp 98.4°F | Wt 178.1 lb

## 2015-01-16 DIAGNOSIS — E785 Hyperlipidemia, unspecified: Secondary | ICD-10-CM | POA: Diagnosis not present

## 2015-01-16 DIAGNOSIS — I10 Essential (primary) hypertension: Secondary | ICD-10-CM | POA: Insufficient documentation

## 2015-01-16 DIAGNOSIS — C182 Malignant neoplasm of ascending colon: Secondary | ICD-10-CM | POA: Insufficient documentation

## 2015-01-16 DIAGNOSIS — Z86010 Personal history of colonic polyps: Secondary | ICD-10-CM | POA: Diagnosis not present

## 2015-01-16 DIAGNOSIS — Z9221 Personal history of antineoplastic chemotherapy: Secondary | ICD-10-CM | POA: Diagnosis not present

## 2015-01-16 DIAGNOSIS — D509 Iron deficiency anemia, unspecified: Secondary | ICD-10-CM | POA: Diagnosis not present

## 2015-01-16 DIAGNOSIS — Z79899 Other long term (current) drug therapy: Secondary | ICD-10-CM

## 2015-01-16 DIAGNOSIS — E039 Hypothyroidism, unspecified: Secondary | ICD-10-CM | POA: Insufficient documentation

## 2015-01-16 DIAGNOSIS — E079 Disorder of thyroid, unspecified: Secondary | ICD-10-CM | POA: Diagnosis not present

## 2015-01-16 DIAGNOSIS — F17210 Nicotine dependence, cigarettes, uncomplicated: Secondary | ICD-10-CM | POA: Diagnosis not present

## 2015-01-16 DIAGNOSIS — C180 Malignant neoplasm of cecum: Secondary | ICD-10-CM

## 2015-01-16 DIAGNOSIS — K589 Irritable bowel syndrome without diarrhea: Secondary | ICD-10-CM | POA: Diagnosis not present

## 2015-01-16 LAB — CBC WITH DIFFERENTIAL/PLATELET
Basophils Absolute: 0 10*3/uL (ref 0–0.1)
Basophils Relative: 1 %
Eosinophils Absolute: 0 10*3/uL (ref 0–0.7)
Eosinophils Relative: 1 %
HCT: 32.8 % — ABNORMAL LOW (ref 35.0–47.0)
Hemoglobin: 10.5 g/dL — ABNORMAL LOW (ref 12.0–16.0)
Lymphocytes Relative: 20 %
Lymphs Abs: 1 10*3/uL (ref 1.0–3.6)
MCH: 29.4 pg (ref 26.0–34.0)
MCHC: 32.1 g/dL (ref 32.0–36.0)
MCV: 91.5 fL (ref 80.0–100.0)
Monocytes Absolute: 0.3 10*3/uL (ref 0.2–0.9)
Monocytes Relative: 6 %
Neutro Abs: 3.6 10*3/uL (ref 1.4–6.5)
Neutrophils Relative %: 72 %
Platelets: 273 10*3/uL (ref 150–440)
RBC: 3.59 MIL/uL — ABNORMAL LOW (ref 3.80–5.20)
RDW: 15 % — ABNORMAL HIGH (ref 11.5–14.5)
WBC: 4.9 10*3/uL (ref 3.6–11.0)

## 2015-01-16 LAB — IRON AND TIBC
Iron: 52 ug/dL (ref 28–170)
Saturation Ratios: 15 % (ref 10.4–31.8)
TIBC: 350 ug/dL (ref 250–450)
UIBC: 298 ug/dL

## 2015-01-16 LAB — FERRITIN: Ferritin: 37 ng/mL (ref 11–307)

## 2015-01-16 NOTE — Patient Outreach (Signed)
Unsuccessful outreach for [facility] community care outreach. HIPPA compliant message left.   Plan: RNCM will await return call  RNCM will attempt outreach again in 2 days.  Rutherford Limerick RN, BSN  [facility] Care Management [phone])

## 2015-01-16 NOTE — Progress Notes (Signed)
Melissa Fitzgerald @ [facility] Telephone:([phone]) [phone]  Fax:(336) Melissa Fitzgerald: [birth year]  MR#: [id number]  XNA#:[id number]  Patient Care Team: [name], PA as PCP - General (Physician Assistant) [name], MD as Consulting Physician (General Surgery) Lollie Sails, MD as Referring Physician (Internal Medicine) Melissa Gleason, MD (Oncology) Melissa Grills, RN as Melissa S Minor, RN as [place]:  Chief Complaint  Patient presents with  . OTHER    Oncology History   69 year old lady who does not have any major medical problem was admitted in the hospital for anemia requiring blood transfusion.  Patient underwent colonoscopy.  In infiltrative  mass was found in the ascending colon .   1. 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 Jamal Collin Colonoscopy was done in June of 23, 2015 by Dr. Gustavo Lah. 2. Started adjuvant chemotherapy with FOLFOX on August 12th 2015 oxaliplatin was put on hold after 6 cycles because of neuropathy 3.send finished 12 cycles of chemotherapy with FOLFOX.  Last few cycle oxaliplatin was held because of neuropath     Cancer of colon ([place])   09/21/2013 Initial Diagnosis Cancer of colon    No flowsheet data found.  INTERVAL HISTORY: 69 year old lady with a history of carcinoma of colon came today further follow-up complains of burning at the site of the port in pain.  . Patient had upper endoscopy done on October of 2016 and was reported to be having gastritis.  Patient was started on Nexium. Getting regular mammogram done.  Does not want flu shot as according to patient made her sick.  No nausea.  No vomiting.  No diarrhea.  Appetite has been stable. REVIEW OF SYSTEMS:     general status: Patient is feeling weak and tired.  No change in a  performance status.  No chills.  No fever. HEENT: Alopecia.  No evidence of stomatitis Lungs: No cough or shortness of breath Cardiac: No chest pain or paroxysmal nocturnal dyspnea GI: No nausea no vomiting no diarrhea no abdominal pain Skin: No rash Lower extremity no swelling Neurological system: No tingling.  No numbness.  No other focal signs Musculoskeletal system no bony pains As per history of present illness As per HPI. Otherwise, a complete review of systems is negatve.  PAST MEDICAL HISTORY: Past Medical History  Diagnosis Date  . Irritable bowel disease   . Colon polyp   . Anemia   . Thyroid disease   . Hyperlipidemia   . Kidney stone   . Hypertension     PAST SURGICAL HISTORY: Past Surgical History  Procedure Laterality Date  . Colonoscopy  09-13-13    Dr [name]  . Tonsillectomy    . Kidney stone surgery    . Thyroid surgery    . Foot surgery    . Nasal sinus surgery    . Abdominal hysterectomy    . Cholecystectomy  2000  . Portacath placement  10/24/13  . Breast enhancement surgery    . Colonoscopy with propofol N/A 10/18/2014    Procedure: COLONOSCOPY WITH PROPOFOL;  Surgeon: [name], MD;  Location: [facility] ENDOSCOPY;  Service: Endoscopy;  Laterality: N/A;  . Esophagogastroduodenoscopy (egd) with propofol N/A 01/03/2015    Procedure: ESOPHAGOGASTRODUODENOSCOPY (EGD) WITH PROPOFOL;  Surgeon: [name], MD;  Location: [facility] ENDOSCOPY;  Service: Endoscopy;  Laterality: N/A;    FAMILY HISTORY Family History  Problem Relation Age of Onset  . Colon cancer      first cousin  . Breast cancer Maternal Aunt   . Breast cancer Paternal Aunt   . Heart disease Mother   . Diabetes Mother   . Arthritis Sister   . Hyperlipidemia Sister   . COPD Brother   . Diabetes Sister     ADVANCED DIRECTIVES:  No flowsheet data found.  HEALTH MAINTENANCE: Social History  Substance Use Topics  . Smoking status: Current Some Day Smoker -- 0.25  packs/day    Types: Cigarettes  . Smokeless tobacco: None     Comment: still smoking every now and then, rarely one  . Alcohol Use: 0.0 oz/week    0 Standard drinks or equivalent per week     Comment: occasionally      Allergies  Allergen Reactions  . Codeine Nausea And Vomiting  . Oxycodone Nausea And Vomiting  . Penicillins Nausea And Vomiting  . Tramadol Nausea And Vomiting    Current Outpatient Prescriptions  Medication Sig Dispense Refill  . esomeprazole (NEXIUM) 40 MG capsule Take 1 capsule (40 mg total) by mouth daily at 12 noon. 30 capsule 3  . Ferrous Fumarate 324 (106 FE) MG TABS Take 1 tablet by mouth daily. 30 tablet 3  . levothyroxine (SYNTHROID, LEVOTHROID) 100 MCG tablet Take 1 tablet (100 mcg total) by mouth daily before breakfast. 30 tablet 2  . metoprolol succinate (TOPROL-XL) 25 MG 24 hr tablet Take 1 tablet (25 mg total) by mouth daily. 30 tablet 5  . polyethylene glycol powder (GLYCOLAX/MIRALAX) powder Take 255 g by mouth once. 255 g 0  . polyethylene glycol-electrolytes (NULYTELY/GOLYTELY) 420 G solution one bottle for colonoscopy prep 4000 mL 0  . potassium chloride SA (K-DUR,KLOR-CON) 20 MEQ tablet Take 1 tablet (20 mEq total) by mouth daily. 30 tablet 0  . promethazine (PHENERGAN) 12.5 MG tablet take 1 tablet by mouth every 6 hours for nausea and vomiting  0  . traZODone (DESYREL) 50 MG tablet Take 1 tablet by mouth at bedtime.    Marland Kitchen zolpidem (AMBIEN) 5 MG tablet Take 1 tablet (5 mg total) by mouth at bedtime as needed for sleep. 30 tablet 0   No current facility-administered medications for this visit.    OBJECTIVE:  Filed Vitals:   01/16/15 1527  BP: 160/104  Pulse: 75  Temp: 98.4 F (36.9 C)     Body mass index is 28.76 kg/(m^2).    ECOG FS:1 - Symptomatic but completely ambulatory  PHYSICAL EXAM: GENERAL:  Well developed, well nourished, sitting comfortably in the exam room in no acute distress. MENTAL STATUS:  Alert and oriented to person,  place and time.  symmetric, no Cushingoid features.  ENT:  Oropharynx clear without lesion.  Tongue normal. Mucous membranes moist.  RESPIRATORY:  Clear to auscultation without rales, wheezes or rhonchi. CARDIOVASCULAR:  Regular rate and rhythm without murmur, rub or gallop.  ABDOMEN:  Soft, non-tender, with active bowel sounds, and no hepatosplenomegaly.  No masses. BACK:  No CVA tenderness.  No tenderness on percussion of the back or rib cage. SKIN:  No rashes, ulcers or lesions. EXTREMITIES: No edema, no skin discoloration or tenderness.  No palpable cords. LYMPH NODES: No palpable cervical, supraclavicular, axillary or inguinal adenopathy  NEUROLOGICAL: Unremarkable. PSYCH:  Appropriate.   LAB RESULTS:  Appointment on 01/16/2015  Component Date Value Ref Range Status  . WBC 01/16/2015  4.9  3.6 - 11.0 K/uL Final  . RBC 01/16/2015 3.59* 3.80 - 5.20 MIL/uL Final  . Hemoglobin 01/16/2015 10.5* 12.0 - 16.0 g/dL Final  . HCT 01/16/2015 32.8* 35.0 - 47.0 % Final  . MCV 01/16/2015 91.5  80.0 - 100.0 fL Final  . MCH 01/16/2015 29.4  26.0 - 34.0 pg Final  . MCHC 01/16/2015 32.1  32.0 - 36.0 g/dL Final  . RDW 01/16/2015 15.0* 11.5 - 14.5 % Final  . Platelets 01/16/2015 273  150 - 440 K/uL Final  . Neutrophils Relative % 01/16/2015 72   Final  . Neutro Abs 01/16/2015 3.6  1.4 - 6.5 K/uL Final  . Lymphocytes Relative 01/16/2015 20   Final  . Lymphs Abs 01/16/2015 1.0  1.0 - 3.6 K/uL Final  . Monocytes Relative 01/16/2015 6   Final  . Monocytes Absolute 01/16/2015 0.3  0.2 - 0.9 K/uL Final  . Eosinophils Relative 01/16/2015 1   Final  . Eosinophils Absolute 01/16/2015 0.0  0 - 0.7 K/uL Final  . Basophils Relative 01/16/2015 1   Final  . Basophils Absolute 01/16/2015 0.0  0 - 0.1 K/uL Final     ASSESSMENT: Carcinoma of colon status post chemotherapy with FOLFOX Anemia with ferritin level is low Abdominal pain patient is getting colonoscopy done Hypothyroidism patient is on  Synthroid 125 g.  MEDICAL DECISION MAKING:  Lab data has been reviewed. Anemia is iron deficiency will start patient on oral iron and recheck hemoglobin in 3 months CEA is 1.9 Patient's port needs to be removed as it appears to be pushing under the skin will get Dr. Jamal Collin to remove port T4 has been rechecked and I will level as dropped to 8.4 On clinical examination there is no evidence of recurrent disease D Follow-up. Patient expressed understanding and was in agreement with this plan. She also understands that She can call clinic at any time with any questions, concerns, or complaints.    No matching staging information was found for the patient.  Melissa Gleason, MD   01/16/2015 3:43 PM

## 2015-01-17 ENCOUNTER — Encounter: Payer: Self-pay | Admitting: Oncology

## 2015-01-17 LAB — CEA: CEA: 1.9 ng/mL (ref 0.0–4.7)

## 2015-01-19 ENCOUNTER — Other Ambulatory Visit: Payer: Self-pay | Admitting: *Deleted

## 2015-01-19 NOTE — Patient Outreach (Signed)
Unsuccessful outreach for [facility] community care management. HIPPA compliant message left.   Plan: RNCM will await return call  RNCM will attempt outreach again in 2 days.  Rutherford Limerick RN, BSN  [facility] Care Management [phone])

## 2015-01-24 ENCOUNTER — Encounter: Payer: Self-pay | Admitting: *Deleted

## 2015-01-24 NOTE — Patient Outreach (Signed)
[facility]) Care Management  01/24/2015  AMANDA STEUART May 01, 1945 [id number]   Outreach letter sent to attempt to reach pt for services.   Plan: Wait for call back from pt. Attempt one more call to reach pt.  Rutherford Limerick RN, BSN  [facility] Care Management [phone])

## 2015-01-25 ENCOUNTER — Other Ambulatory Visit: Payer: Self-pay | Admitting: *Deleted

## 2015-01-25 NOTE — Patient Outreach (Signed)
Unsuccessful outreach for [facility] screening. Unable to leave a message, phone just continued to ring.   Plan: RNCM has sent outreach letter to pt Will await a return call.   Rutherford Limerick RN, BSN  [facility] Care Management [phone])

## 2015-01-26 ENCOUNTER — Other Ambulatory Visit: Payer: Self-pay | Admitting: Family Medicine

## 2015-01-26 NOTE — Telephone Encounter (Signed)
Pt contacted office for refill request on the following medications: potassium chloride SA (K-DUR,KLOR-CON) 20 MEQ tablet.  [facility] [address]  CB#[phone]/MW

## 2015-01-29 ENCOUNTER — Encounter: Payer: Self-pay | Admitting: Family Medicine

## 2015-01-29 ENCOUNTER — Ambulatory Visit (INDEPENDENT_AMBULATORY_CARE_PROVIDER_SITE_OTHER): Payer: Commercial Managed Care - HMO | Admitting: Family Medicine

## 2015-01-29 VITALS — BP 142/98 | HR 64 | Temp 98.3°F | Resp 16 | Wt 181.4 lb

## 2015-01-29 DIAGNOSIS — J0110 Acute frontal sinusitis, unspecified: Secondary | ICD-10-CM

## 2015-01-29 DIAGNOSIS — J309 Allergic rhinitis, unspecified: Secondary | ICD-10-CM

## 2015-01-29 MED ORDER — PREDNISONE 5 MG PO TABS: 5.0000 mg | ORAL_TABLET | Freq: Every day | ORAL | Status: DC

## 2015-01-29 MED ORDER — AZITHROMYCIN 250 MG PO TABS: ORAL_TABLET | ORAL | Status: DC

## 2015-01-29 NOTE — Progress Notes (Signed)
Patient ID: Melissa Fitzgerald, female   DOB: 08-27-1945, 69 y.o.   MRN: [id number] Name: Melissa Fitzgerald   MRN: [id number]    DOB: 11-Mar-1946   Date:01/29/2015       Progress Note  Subjective  Chief Complaint  Chief Complaint  Patient presents with  . Sinusitis   Sinusitis This is a new problem. The current episode started 1 to 4 weeks ago. The problem has been gradually worsening since onset. There has been no fever. Associated symptoms include chills, coughing, headaches, sinus pressure and sneezing. Pertinent negatives include no congestion or ear pain. Past treatments include acetaminophen (OTC Sinex). The treatment provided mild relief.   Past Surgical History  Procedure Laterality Date  . Colonoscopy  09-13-13    Dr [name]  . Tonsillectomy    . Kidney stone surgery    . Thyroid surgery    . Foot surgery    . Nasal sinus surgery    . Abdominal hysterectomy    . Cholecystectomy  2000  . Portacath placement  10/24/13  . Breast enhancement surgery    . Colonoscopy with propofol N/A 10/18/2014    Procedure: COLONOSCOPY WITH PROPOFOL;  Surgeon: [name], MD;  Location: [facility] ENDOSCOPY;  Service: Endoscopy;  Laterality: N/A;  . Esophagogastroduodenoscopy (egd) with propofol N/A 01/03/2015    Procedure: ESOPHAGOGASTRODUODENOSCOPY (EGD) WITH PROPOFOL;  Surgeon: [name], MD;  Location: [facility] ENDOSCOPY;  Service: Endoscopy;  Laterality: N/A;   Family History  Problem Relation Age of Onset  . Colon cancer      first cousin  . Breast cancer Maternal Aunt   . Breast cancer Paternal Aunt   . Heart disease Mother   . Diabetes Mother   . Arthritis Sister   . Hyperlipidemia Sister   . COPD Brother   . Diabetes Sister    Past Medical History  Diagnosis Date  . Irritable bowel disease   . Colon polyp   . Anemia   . Thyroid disease   . Hyperlipidemia   . Kidney stone   . Hypertension    Social History  Substance Use Topics  . Smoking status: Current Some Day  Smoker -- 0.25 packs/day    Types: Cigarettes  . Smokeless tobacco: Not on file     Comment: still smoking every now and then, rarely one  . Alcohol Use: 0.0 oz/week    0 Standard drinks or equivalent per week     Comment: occasionally    Current outpatient prescriptions:  .  esomeprazole (NEXIUM) 40 MG capsule, Take 1 capsule (40 mg total) by mouth daily at 12 noon., Disp: 30 capsule, Rfl: 3 .  Ferrous Fumarate 324 (106 FE) MG TABS, Take 1 tablet by mouth daily., Disp: 30 tablet, Rfl: 3 .  levothyroxine (SYNTHROID, LEVOTHROID) 100 MCG tablet, Take 1 tablet (100 mcg total) by mouth daily before breakfast., Disp: 30 tablet, Rfl: 2 .  metoprolol succinate (TOPROL-XL) 25 MG 24 hr tablet, Take 1 tablet (25 mg total) by mouth daily., Disp: 30 tablet, Rfl: 5 .  polyethylene glycol-electrolytes (NULYTELY/GOLYTELY) 420 G solution, one bottle for colonoscopy prep, Disp: 4000 mL, Rfl: 0 .  potassium chloride SA (K-DUR,KLOR-CON) 20 MEQ tablet, Take 1 tablet (20 mEq total) by mouth daily., Disp: 30 tablet, Rfl: 0 .  promethazine (PHENERGAN) 12.5 MG tablet, take 1 tablet by mouth every 6 hours for nausea and vomiting, Disp: , Rfl: 0 .  traZODone (DESYREL) 50 MG tablet, Take 1 tablet by mouth at  bedtime., Disp: , Rfl:  .  zolpidem (AMBIEN) 5 MG tablet, Take 1 tablet (5 mg total) by mouth at bedtime as needed for sleep. (Patient not taking: Reported on 01/29/2015), Disp: 30 tablet, Rfl: 0  Allergies  Allergen Reactions  . Codeine Nausea And Vomiting  . Oxycodone Nausea And Vomiting  . Penicillins Nausea And Vomiting  . Tramadol Nausea And Vomiting   Review of Systems  Constitutional: Positive for chills.  HENT: Positive for sinus pressure and sneezing. Negative for congestion and ear pain.   Eyes: Negative.   Respiratory: Positive for cough.   Cardiovascular: Negative.   Gastrointestinal: Negative.   Genitourinary: Negative.   Musculoskeletal: Negative.   Neurological: Positive for headaches.   Endo/Heme/Allergies: Negative.   Psychiatric/Behavioral: Negative.    Objective  Filed Vitals:   01/29/15 0822  BP: 142/98  Pulse: 64  Temp: 98.3 F (36.8 C)  TempSrc: Oral  Resp: 16  Weight: 181 lb 6.4 oz (82.283 kg)  SpO2: 96%   Physical Exam  Constitutional: She is well-developed, well-nourished, and in no distress.  HENT:  Head: Normocephalic and atraumatic.  Right Ear: External ear normal.  Left Ear: External ear normal.  Nose: Nose normal.  Mouth/Throat: Oropharynx is clear and moist.  No transillumination. Frontal sinus pressure with clear to white mucus from nose.  Eyes: Conjunctivae and EOM are normal.  Neck: Normal range of motion. Neck supple.  Cardiovascular: Normal rate, regular rhythm and normal heart sounds.   Pulmonary/Chest: Effort normal and breath sounds normal.  Abdominal: Bowel sounds are normal.  Lymphadenopathy:    She has no cervical adenopathy.  Neurological: She is alert.    Recent Results (from the past 2160 hour(s))  Iron     Status: None   Collection Time: 11/23/14 12:45 PM  Result Value Ref Range   Iron 105 27 - 139 ug/dL  CBC with Differential/Platelet     Status: Abnormal   Collection Time: 11/23/14 12:45 PM  Result Value Ref Range   WBC 5.3 3.4 - 10.8 x10E3/uL   RBC 3.97 3.77 - 5.28 x10E6/uL   Hemoglobin 11.5 11.1 - 15.9 g/dL   Hematocrit 37.1 34.0 - 46.6 %   MCV 94 79 - 97 fL   MCH 29.0 26.6 - 33.0 pg   MCHC 31.0 (L) 31.5 - 35.7 g/dL   RDW 15.2 12.3 - 15.4 %   Platelets 251 150 - 379 x10E3/uL   Neutrophils 71 %   Lymphs 22 %   Monocytes 6 %   Eos 1 %   Basos 0 %   Neutrophils Absolute 3.7 1.4 - 7.0 x10E3/uL   Lymphocytes Absolute 1.2 0.7 - 3.1 x10E3/uL   Monocytes Absolute 0.3 0.1 - 0.9 x10E3/uL   EOS (ABSOLUTE) 0.1 0.0 - 0.4 x10E3/uL   Basophils Absolute 0.0 0.0 - 0.2 x10E3/uL   Immature Granulocytes 0 %   Immature Grans (Abs) 0.0 0.0 - 0.1 x10E3/uL  TSH     Status: Abnormal   Collection Time: 11/23/14 12:45 PM   Result Value Ref Range   TSH 0.225 (L) 0.450 - 4.500 uIU/mL  T4     Status: None   Collection Time: 11/23/14 12:45 PM  Result Value Ref Range   T4, Total 8.8 4.5 - 12.0 ug/dL  T3, free     Status: None   Collection Time: 11/23/14 12:45 PM  Result Value Ref Range   T3, Free 2.6 2.0 - 4.4 pg/mL  Comprehensive metabolic panel     Status: None  Collection Time: 11/23/14 12:45 PM  Result Value Ref Range   Glucose 87 65 - 99 mg/dL   BUN 17 8 - 27 mg/dL   Creatinine, Ser 0.74 0.57 - 1.00 mg/dL   GFR calc non Af Amer 84 >59 mL/min/1.73   GFR calc Af Amer 96 >59 mL/min/1.73   BUN/Creatinine Ratio 23 11 - 26   Sodium 143 134 - 144 mmol/L   Potassium 4.8 3.5 - 5.2 mmol/L   Chloride 103 97 - 108 mmol/L   CO2 27 18 - 29 mmol/L   Calcium 9.7 8.7 - 10.3 mg/dL   Total Protein 7.9 6.0 - 8.5 g/dL   Albumin 4.6 3.6 - 4.8 g/dL   Globulin, Total 3.3 1.5 - 4.5 g/dL   Albumin/Globulin Ratio 1.4 1.1 - 2.5   Bilirubin Total 0.2 0.0 - 1.2 mg/dL   Alkaline Phosphatase 67 39 - 117 IU/L   AST 20 0 - 40 IU/L   ALT 16 0 - 32 IU/L  Surgical pathology     Status: None   Collection Time: 01/03/15  9:09 AM  Result Value Ref Range   SURGICAL PATHOLOGY      Surgical Pathology CASE: [id number] PATIENT: Carmelia Bake Surgical Pathology Report     SPECIMEN SUBMITTED: A. Stomach, antrum and body, cbx  CLINICAL HISTORY: None provided  PRE-OPERATIVE DIAGNOSIS: RUQ pain  POST-OPERATIVE DIAGNOSIS: Gastritis     DIAGNOSIS: A. STOMACH, ANTRUM AND BODY; COLD BIOPSY: - ANTRAL MUCOSA WITH HEALING EROSIVE GASTRITIS. - OXYNTIC MUCOSA WITH MILD CHRONIC GASTRITIS. - NEGATIVE FOR H. PYLORI, DYSPLASIA, AND MALIGNANCY.  GROSS DESCRIPTION:  A. Labeled: C biopsy antrum and body  Tissue fragment(s): 3  Size: 0.2-0.3 cm  Description: pink  Entirely submitted in one cassette(s).    Final Diagnosis performed by Quay Burow, MD.  Electronically signed 01/04/2015 11:00:03AM    The electronic  signature indicates that the named Attending Pathologist has evaluated the specimen  Technical component performed at [facility], [address] [place], [place] [address] Lab: [phone] Dir: Darrick Penna. Evette Doffing, MD  Professio nal component performed at [facility], [place], [place], [place] [address] Lab: [phone] Dir: Dellia Nims. Rubinas, MD    CBC with Differential     Status: Abnormal   Collection Time: 01/16/15  2:46 PM  Result Value Ref Range   WBC 4.9 3.6 - 11.0 K/uL   RBC 3.59 (L) 3.80 - 5.20 MIL/uL   Hemoglobin 10.5 (L) 12.0 - 16.0 g/dL   HCT 32.8 (L) 35.0 - 47.0 %   MCV 91.5 80.0 - 100.0 fL   MCH 29.4 26.0 - 34.0 pg   MCHC 32.1 32.0 - 36.0 g/dL   RDW 15.0 (H) 11.5 - 14.5 %   Platelets 273 150 - 440 K/uL   Neutrophils Relative % 72 %   Neutro Abs 3.6 1.4 - 6.5 K/uL   Lymphocytes Relative 20 %   Lymphs Abs 1.0 1.0 - 3.6 K/uL   Monocytes Relative 6 %   Monocytes Absolute 0.3 0.2 - 0.9 K/uL   Eosinophils Relative 1 %   Eosinophils Absolute 0.0 0 - 0.7 K/uL   Basophils Relative 1 %   Basophils Absolute 0.0 0 - 0.1 K/uL  CEA     Status: None   Collection Time: 01/16/15  2:46 PM  Result Value Ref Range   CEA 1.9 0.0 - 4.7 ng/mL    Comment: (NOTE)       Roche ECLIA methodology  Nonsmokers  <3.9                                     Smokers     <5.6 Performed At: [facility] [place], [place] [id number] Lindon Romp MD OZ:[id number]   Ferritin     Status: None   Collection Time: 01/16/15  2:46 PM  Result Value Ref Range   Ferritin 37 11 - 307 ng/mL  Iron and TIBC     Status: None   Collection Time: 01/16/15  2:46 PM  Result Value Ref Range   Iron 52 28 - 170 ug/dL   TIBC 350 250 - 450 ug/dL   Saturation Ratios 15 10.4 - 31.8 %   UIBC 298 ug/dL   Assessment & Plan  1. Acute frontal sinusitis, recurrence not specified Onset of frontal sinus pressure with post nasal drip and chills. Denies sore  throat and cough. Will treat with antibiotic and prednisone taper. May use Tylenol prn headache (oncologist does not want her to take any NSAID's). Recheck if no better in a week. - azithromycin (ZITHROMAX) 250 MG tablet; Two tablets by mouth today then one daily for 4 days.  Dispense: 6 tablet; Refill: 0  2. Allergic rhinitis, unspecified allergic rhinitis type Usually has some sneezing in the spring and fall. Associated with recent frontal sinus headache. May use antihistamine of choice. Declines steroid nasal spray. Will treat with prednisone taper and recheck in a week if no better. - predniSONE (DELTASONE) 5 MG tablet; Take 1 tablet (5 mg total) by mouth daily with breakfast. Taper down by one tablet daily (6,5,4,3,2,1)  Dispense: 21 tablet; Refill: 0

## 2015-01-30 ENCOUNTER — Other Ambulatory Visit: Payer: Self-pay | Admitting: Family Medicine

## 2015-01-30 DIAGNOSIS — E890 Postprocedural hypothyroidism: Secondary | ICD-10-CM

## 2015-01-30 NOTE — Telephone Encounter (Signed)
Pt contacted office for refill request on the following medications: levothyroxine (SYNTHROID, LEVOTHROID) 100 MCG tablet to [facility]. Thanks TNP

## 2015-02-02 MED ORDER — LEVOTHYROXINE SODIUM 100 MCG PO TABS: 100.0000 ug | ORAL_TABLET | Freq: Every day | ORAL | Status: DC

## 2015-02-02 NOTE — Telephone Encounter (Signed)
Refilled Synthroid at the [facility]. Advise patient to schedule recheck of thyroid panel and blood tests in the next 3-4 months.

## 2015-02-05 NOTE — Telephone Encounter (Signed)
Patient advised as directed below. Patient verbalized understanding.  

## 2015-02-13 ENCOUNTER — Other Ambulatory Visit: Payer: Self-pay | Admitting: *Deleted

## 2015-02-13 NOTE — Patient Outreach (Signed)
Unable to connect with pt. 3 unsuccessful attempts and pt letter attempted to contact pt.  Plan: Close case  Coy Vandoren RN, BSN  [facility] Care Management [phone])

## 2015-02-20 ENCOUNTER — Telehealth: Payer: Self-pay | Admitting: Family Medicine

## 2015-02-20 ENCOUNTER — Telehealth: Payer: Self-pay | Admitting: *Deleted

## 2015-02-20 NOTE — Telephone Encounter (Signed)
This med has been being filled by PCP and she was advised to call their office for a refill

## 2015-03-02 ENCOUNTER — Ambulatory Visit: Payer: Commercial Managed Care - HMO | Admitting: Family Medicine

## 2015-03-12 ENCOUNTER — Ambulatory Visit: Payer: Self-pay | Admitting: General Surgery

## 2015-03-22 ENCOUNTER — Encounter: Payer: Self-pay | Admitting: General Surgery

## 2015-03-22 ENCOUNTER — Ambulatory Visit (INDEPENDENT_AMBULATORY_CARE_PROVIDER_SITE_OTHER): Payer: Commercial Managed Care - HMO | Admitting: General Surgery

## 2015-03-22 VITALS — BP 132/80 | HR 74 | Resp 12 | Ht 66.0 in | Wt 180.0 lb

## 2015-03-22 DIAGNOSIS — Z85038 Personal history of other malignant neoplasm of large intestine: Secondary | ICD-10-CM | POA: Diagnosis not present

## 2015-03-22 DIAGNOSIS — D509 Iron deficiency anemia, unspecified: Secondary | ICD-10-CM | POA: Diagnosis not present

## 2015-03-22 NOTE — Patient Instructions (Addendum)
Patient to return on 6 months. Call for any abdominal, bowel symptoms.

## 2015-03-22 NOTE — Progress Notes (Signed)
Patient ID: Melissa Fitzgerald, female   DOB: October 17, 1945, 69 y.o.   MRN: [id number]  Chief Complaint  Patient presents with  . Other    colon cancer    HPI Melissa Fitzgerald is a 69 y.o. female here today for her six months follow up colon cancer. Patient states she is doing well. No GI complaints. Still using iron supplements. I have reviewed the history of present illness with the patient. HPI  Past Medical History  Diagnosis Date  . Irritable bowel disease   . Colon polyp   . Anemia   . Thyroid disease   . Hyperlipidemia   . Kidney stone   . Hypertension     Past Surgical History  Procedure Laterality Date  . Colonoscopy  09-13-13    Dr [name]  . Tonsillectomy    . Kidney stone surgery    . Thyroid surgery    . Foot surgery    . Nasal sinus surgery    . Abdominal hysterectomy    . Cholecystectomy  2000  . Portacath placement  10/24/13  . Breast enhancement surgery    . Colonoscopy with propofol N/A 10/18/2014    Procedure: COLONOSCOPY WITH PROPOFOL;  Surgeon: [name], MD;  Location: [facility] ENDOSCOPY;  Service: Endoscopy;  Laterality: N/A;  . Esophagogastroduodenoscopy (egd) with propofol N/A 01/03/2015    Procedure: ESOPHAGOGASTRODUODENOSCOPY (EGD) WITH PROPOFOL;  Surgeon: [name], MD;  Location: [facility] ENDOSCOPY;  Service: Endoscopy;  Laterality: N/A;  . Colectomy  09/2013    Family History  Problem Relation Age of Onset  . Colon cancer      first cousin  . Breast cancer Maternal Aunt   . Breast cancer Paternal Aunt   . Heart disease Mother   . Diabetes Mother   . Arthritis Sister   . Hyperlipidemia Sister   . COPD Brother   . Diabetes Sister     Social History Social History  Substance Use Topics  . Smoking status: Current Some Day Smoker -- 0.25 packs/day    Types: Cigarettes  . Smokeless tobacco: None     Comment: still smoking every now and then, rarely one  . Alcohol Use: 0.0 oz/week    0 Standard drinks or equivalent per week    Comment: occasionally    Allergies  Allergen Reactions  . Codeine Nausea And Vomiting  . Oxycodone Nausea And Vomiting  . Penicillins Nausea And Vomiting  . Tramadol Nausea And Vomiting    Current Outpatient Prescriptions  Medication Sig Dispense Refill  . azithromycin (ZITHROMAX) 250 MG tablet Two tablets by mouth today then one daily for 4 days. 6 tablet 0  . esomeprazole (NEXIUM) 40 MG capsule Take 1 capsule (40 mg total) by mouth daily at 12 noon. 30 capsule 3  . Ferrous Fumarate 324 (106 FE) MG TABS Take 1 tablet by mouth daily. 30 tablet 3  . levothyroxine (SYNTHROID, LEVOTHROID) 100 MCG tablet Take 1 tablet (100 mcg total) by mouth daily before breakfast. 30 tablet 2  . metoprolol succinate (TOPROL-XL) 25 MG 24 hr tablet Take 1 tablet (25 mg total) by mouth daily. 30 tablet 5  . polyethylene glycol-electrolytes (NULYTELY/GOLYTELY) 420 G solution one bottle for colonoscopy prep 4000 mL 0  . potassium chloride SA (K-DUR,KLOR-CON) 20 MEQ tablet Take 1 tablet (20 mEq total) by mouth daily. 30 tablet 0  . predniSONE (DELTASONE) 5 MG tablet Take 1 tablet (5 mg total) by mouth daily with breakfast. Taper down by one tablet  daily (6,5,4,3,2,1) 21 tablet 0  . promethazine (PHENERGAN) 12.5 MG tablet take 1 tablet by mouth every 6 hours for nausea and vomiting  0  . traZODone (DESYREL) 50 MG tablet Take 1 tablet by mouth at bedtime.    Marland Kitchen zolpidem (AMBIEN) 5 MG tablet Take 1 tablet (5 mg total) by mouth at bedtime as needed for sleep. 30 tablet 0   No current facility-administered medications for this visit.    Review of Systems Review of Systems  Constitutional: Negative.   Respiratory: Negative.   Cardiovascular: Negative.     Blood pressure 132/80, pulse 74, resp. rate 12, height 5\' 6"  (1.676 m), weight 180 lb (81.647 kg).  Physical Exam Physical Exam  Constitutional: She is oriented to person, place, and time. She appears well-developed and well-nourished.  Eyes: Conjunctivae  are normal. No scleral icterus.  Neck: Neck supple. No thyromegaly present.  Cardiovascular: Normal rate, regular rhythm and normal heart sounds.   Pulmonary/Chest: Effort normal and breath sounds normal.  Abdominal: Soft. Bowel sounds are normal. There is no hepatomegaly. There is no tenderness. No hernia.    Lymphadenopathy:    She has no cervical adenopathy.  Neurological: She is alert and oriented to person, place, and time.  Skin: Skin is warm and dry.    Data Reviewed Notes reviewed. Last CEA 85mos ago was 1.9-stable. Hgb 10.9 and stable. Assessment    History of cecal cancer-T3,N1,M0  Post rt colectomy/chemo 41mos out. Stable exam. Colonoscopy 4mos ago was normal.    Plan    Patient to return in six months follow up     PCP:  Chrismon, Simona Huh E This information has been scribed by Gaspar Cola CMA.    Keron Neenan G 03/22/2015, 2:09 PM

## 2015-04-17 ENCOUNTER — Encounter: Payer: Self-pay | Admitting: Family Medicine

## 2015-04-17 ENCOUNTER — Ambulatory Visit (INDEPENDENT_AMBULATORY_CARE_PROVIDER_SITE_OTHER): Payer: Commercial Managed Care - HMO | Admitting: Family Medicine

## 2015-04-17 VITALS — BP 126/86 | HR 72 | Temp 97.8°F | Resp 16 | Ht 66.0 in | Wt 183.0 lb

## 2015-04-17 DIAGNOSIS — G44229 Chronic tension-type headache, not intractable: Secondary | ICD-10-CM

## 2015-04-17 DIAGNOSIS — R8290 Unspecified abnormal findings in urine: Secondary | ICD-10-CM

## 2015-04-17 DIAGNOSIS — Z8639 Personal history of other endocrine, nutritional and metabolic disease: Secondary | ICD-10-CM

## 2015-04-17 DIAGNOSIS — D509 Iron deficiency anemia, unspecified: Secondary | ICD-10-CM

## 2015-04-17 DIAGNOSIS — G479 Sleep disorder, unspecified: Secondary | ICD-10-CM

## 2015-04-17 DIAGNOSIS — Z85038 Personal history of other malignant neoplasm of large intestine: Secondary | ICD-10-CM

## 2015-04-17 LAB — POCT URINALYSIS DIPSTICK
Bilirubin, UA: NEGATIVE
Blood, UA: NEGATIVE
Glucose, UA: NEGATIVE
Ketones, UA: NEGATIVE
Leukocytes, UA: NEGATIVE
Nitrite, UA: NEGATIVE
Protein, UA: NEGATIVE
Spec Grav, UA: 1.025
Urobilinogen, UA: 0.2
pH, UA: 5

## 2015-04-17 NOTE — Patient Instructions (Signed)
Tension Headache A tension headache is a feeling of pain, pressure, or aching that is often felt over the front and sides of the head. The pain can be dull, or it can feel tight (constricting). Tension headaches are not normally associated with nausea or vomiting, and they do not get worse with physical activity. Tension headaches can last from 30 minutes to several days. This is the most common type of headache. CAUSES The exact cause of this condition is not known. Tension headaches often begin after stress, anxiety, or depression. Other triggers may include:  Alcohol.  Too much caffeine, or caffeine withdrawal.  Respiratory infections, such as colds, flu, or sinus infections.  Dental problems or teeth clenching.  Fatigue.  Holding your head and neck in the same position for a long period of time, such as while using a computer.  Smoking. SYMPTOMS Symptoms of this condition include:  A feeling of pressure around the head.  Dull, aching head pain.  Pain felt over the front and sides of the head.  Tenderness in the muscles of the head, neck, and shoulders. DIAGNOSIS This condition may be diagnosed based on your symptoms and a physical exam. Tests may be done, such as a CT scan or an MRI of your head. These tests may be done if your symptoms are severe or unusual. TREATMENT This condition may be treated with lifestyle changes and medicines to help relieve symptoms. HOME CARE INSTRUCTIONS Managing Pain  Take over-the-counter and prescription medicines only as told by your health care provider.  Lie down in a dark, quiet room when you have a headache.  If directed, apply ice to the head and neck area:  Put ice in a plastic bag.  Place a towel between your skin and the bag.  Leave the ice on for 20 minutes, 2-3 times per day.  Use a heating pad or a hot shower to apply heat to the head and neck area as told by your health care provider. Eating and Drinking  Eat meals on  a regular schedule.  Limit alcohol use.  Decrease your caffeine intake, or stop using caffeine. General Instructions  Keep all follow-up visits as told by your health care provider. This is important.  Keep a headache journal to help find out what may trigger your headaches. For example, write down:  What you eat and drink.  How much sleep you get.  Any change to your diet or medicines.  Try massage or other relaxation techniques.  Limit stress.  Sit up straight, and avoid tensing your muscles.  Do not use tobacco products, including cigarettes, chewing tobacco, or e-cigarettes. If you need help quitting, ask your health care provider.  Exercise regularly as told by your health care provider.  Get 7-9 hours of sleep, or the amount recommended by your health care provider. SEEK MEDICAL CARE IF:  Your symptoms are not helped by medicine.  You have a headache that is different from what you normally experience.  You have nausea or you vomit.  You have a fever. SEEK IMMEDIATE MEDICAL CARE IF:  Your headache becomes severe.  You have repeated vomiting.  You have a stiff neck.  You have a loss of vision.  You have problems with speech.  You have pain in your eye or ear.  You have muscular weakness or loss of muscle control.  You lose your balance or you have trouble walking.  You feel faint or you pass out.  You have confusion.     This information is not intended to replace advice given to you by your health care provider. Make sure you discuss any questions you have with your health care provider.   Document Released: 03/10/2005 Document Revised: 11/29/2014 Document Reviewed: 07/03/2014 Elsevier Interactive Patient Education 2016 [employer]  

## 2015-04-17 NOTE — Progress Notes (Signed)
Patient ID: Melissa Fitzgerald, female   DOB: 04-25-1945, 70 y.o.   MRN: [id number]       Patient: Melissa Fitzgerald Female    DOB: 04-Sep-1945   70 y.o.   MRN: [id number] Visit Date: 04/17/2015  Today's Provider: Vernie Murders, PA   Chief Complaint  Patient presents with  . Headache  . Insomnia   Subjective:    HPI Headache: Patient complains of headache. She does not have a headache at this time.   Description of Headaches: Location of pain: bilateral, temporal, frontal Radiation of pain?:none Character of pain:aching and dull Severity of pain: 6 Accompanying symptoms: photophobia Rapidity of onset: sudden Typical duration of individual headache: a few hours Are most headaches similar in presentation? yes Typical precipitants: stress  Temporal Pattern of Headaches: Started having HAs several years ago Worst time of day: morning Awaken from sleep?: yes  Seasonal pattern?: no Overall pattern since problem began: gradually worsening  Degree of Functional Impairment: mild  Current Use of Meds to Treat HA: Abortive meds? butalbital/caffeine/aspirin Daily use? yes - 1-2 times a day Prophylactic meds? beta-blockers  Additional Relevant History: History of head/neck trauma? no History of head/neck surgery? yes - neck for thyroid Family h/o headache problems? yes  Use of meds that might worsen HAs? yes - nexium Exposure to carbon monoxide? no Substance use: tobacco:   Insomnia: Patient reports that her insomnia is worsening. Patient reports she is sleeping 1-2 hours a night. Patient reports she has a hard time falling asleep. Patient reports she wakes up often. Patient reports she is also having some dizziness for about two weeks. Patient reports she is also feeling depressed. Patient reports she has been taking OTC medication for sleep 1-2 times a week. Patient reports medications help her sleep about 4 hours. Patient denies stress from family.   Urinary Tract Infection:  Patient complains of abnormal smelling urine She has had symptoms for a few weeks. Patient also complains of none. Patient denies vaginal discharge. Patient does have a history of recurrent UTI.  Patient does have a history of pyelonephritis.     Patient Active Problem List   Diagnosis Date Noted  . History of colon cancer 03/22/2015  . 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 Anemia, iron deficiency 10/23/2014  . HLD (hyperlipidemia) 10/23/2014  . Essential (primary) hypertension 10/23/2014  . Acid reflux 10/23/2014  . Clinical depression 10/23/2014  . Peptic ulcer 10/23/2014  . Hypothyroidism, postop 10/23/2014  . Anemia 10/18/2014  . Arthritis 10/18/2014  . Chronic constipation 10/18/2014  . Sleep disturbance 10/18/2014  . Elevated liver enzymes 10/18/2014  . Chronic headache 10/18/2014  . History of renal calculi 10/18/2014  . Malignant neoplasm of cecum ([place]) 10/17/2013  . Cancer of colon ([place]) 09/21/2013  . Malignant neoplasm of colon ([place]) 09/21/2013   Past Surgical History  Procedure Laterality Date  . Colonoscopy  09-13-13    Dr [name]  . Tonsillectomy    . Kidney stone surgery    . Thyroid surgery    . Foot surgery    . Nasal sinus surgery    . Abdominal hysterectomy    . Cholecystectomy  2000  . Portacath placement  10/24/13  . Breast enhancement surgery    . Colonoscopy with propofol N/A 10/18/2014    Procedure: COLONOSCOPY WITH PROPOFOL;  Surgeon: [name], MD;  Location: [facility] ENDOSCOPY;  Service: Endoscopy;  Laterality: N/A;  . Esophagogastroduodenoscopy (egd) with propofol  N/A 01/03/2015    Procedure: ESOPHAGOGASTRODUODENOSCOPY (EGD) WITH PROPOFOL;  Surgeon: [name], MD;  Location: [facility] ENDOSCOPY;  Service: Endoscopy;  Laterality: N/A;  . Colectomy  09/2013   Family History  Problem Relation Age of Onset  . Colon cancer      first cousin  . Breast cancer Maternal Aunt   . Breast  cancer Paternal Aunt   . Heart disease Mother   . Diabetes Mother   . Arthritis Sister   . Hyperlipidemia Sister   . COPD Brother   . Diabetes Sister    Allergies  Allergen Reactions  . Codeine Nausea And Vomiting  . Oxycodone Nausea And Vomiting  . Penicillins Nausea And Vomiting  . Tramadol Nausea And Vomiting   Previous Medications   ESOMEPRAZOLE (NEXIUM) 40 MG CAPSULE    Take 1 capsule (40 mg total) by mouth daily at 12 noon.   FERROUS FUMARATE 324 (106 FE) MG TABS    Take 1 tablet by mouth daily.   LEVOTHYROXINE (SYNTHROID, LEVOTHROID) 100 MCG TABLET    Take 1 tablet (100 mcg total) by mouth daily before breakfast.   METOPROLOL SUCCINATE (TOPROL-XL) 25 MG 24 HR TABLET    Take 1 tablet (25 mg total) by mouth daily.   POLYETHYLENE GLYCOL-ELECTROLYTES (NULYTELY/GOLYTELY) 420 G SOLUTION    one bottle for colonoscopy prep    Review of Systems  Constitutional: Negative.   HENT: Positive for postnasal drip and rhinorrhea. Negative for ear pain.   Eyes: Negative.   Respiratory: Negative.   Cardiovascular: Negative.   Genitourinary: Negative for dysuria, frequency, hematuria, vaginal bleeding, vaginal discharge and difficulty urinating.  Neurological: Positive for light-headedness and headaches. Negative for syncope.    Social History  Substance Use Topics  . Smoking status: Current Some Day Smoker -- 0.25 packs/day    Types: Cigarettes  . Smokeless tobacco: Not on file     Comment: still smoking every now and then, rarely one  . Alcohol Use: 0.0 oz/week    0 Standard drinks or equivalent per week     Comment: occasionally   Objective:   BP 126/86 mmHg  Pulse 72  Temp(Src) 97.8 F (36.6 C) (Oral)  Resp 16  Ht 5\' 6"  (1.676 m)  Wt 183 lb (83.008 kg)  BMI 29.55 kg/m2  Physical Exam  Constitutional: She is oriented to person, place, and time. She appears well-developed and well-nourished.  HENT:  Head: Normocephalic.  Right Ear: External ear normal.  Left Ear:  External ear normal.  Nose: Nose normal.  Mouth/Throat: Oropharynx is clear and moist.  Eyes: Conjunctivae and EOM are normal.  Neck: Normal range of motion. Neck supple. No thyromegaly present.  Cardiovascular: Normal rate and normal heart sounds.   Pulmonary/Chest: Effort normal and breath sounds normal.  Abdominal: Soft. Bowel sounds are normal.  Musculoskeletal:  Prominent joints of all fingers with occasional ache and stiffness - hx arthritis.  Lymphadenopathy:    She has no cervical adenopathy.  Neurological: She is alert and oriented to person, place, and time. She has normal reflexes. Coordination normal.  Psychiatric: Her behavior is normal. Her affect is blunt.      Assessment & Plan:     1. Foul smelling urine Intermittent over the past couple weeks. No frequency or urgency. No hematuria. Urinalysis essentially normal except elevated specific gravity. Recommend increase water in take and recheck prn. Denies any vaginal discharge.  - POCT urinalysis dipstick  2. History of colon cancer Diagnosed June 2015 with  partial right colectomy in July 2015 by Dr. Jamal Collin for cecal cancer - T3,N1,M0 status post chemo and follow up colonoscopy 6-7 months ago that was normal/no new lesions.  3. Sleep disturbance Has continued to have difficulty falling asleep. OTC Tylenol-PM has helped but only uses it 1-2 times a week. May continue this regimen and recheck.  4. Anemia, iron deficiency Continues to take some iron supplement since finishing her chemotherapy for cecal cancer. Will recheck labs and ferritin level to see if she is getting an adequate amount of iron. - CBC with Differential/Platelet - COMPLETE METABOLIC PANEL WITH GFR - Ferritin  5. Chronic tension-type headache, not intractable Occurring nearly everyday. Does not have a headache today. May continue Excedrin Migraine or Percogesic prn.  6. History of hypothyroidism Tolerating Levothyroxine 100 mcg qd without  hyperthyroid symptoms. Will continue present dosage and recheck thyroid function tests. - TSH - T4       Vernie Murders, PA  [facility]

## 2015-04-18 LAB — TSH: TSH: 0.432 u[IU]/mL — ABNORMAL LOW (ref 0.450–4.500)

## 2015-04-18 LAB — COMPREHENSIVE METABOLIC PANEL
ALT: 16 IU/L (ref 0–32)
AST: 21 IU/L (ref 0–40)
Albumin/Globulin Ratio: 1.5 (ref 1.1–2.5)
Albumin: 4.3 g/dL (ref 3.6–4.8)
Alkaline Phosphatase: 68 IU/L (ref 39–117)
BUN/Creatinine Ratio: 13 (ref 11–26)
BUN: 15 mg/dL (ref 8–27)
Bilirubin Total: <0.2 mg/dL (ref 0.0–1.2)
CO2: 26 mmol/L (ref 18–29)
Calcium: 9.5 mg/dL (ref 8.7–10.3)
Chloride: 103 mmol/L (ref 96–106)
Creatinine, Ser: 1.14 mg/dL — ABNORMAL HIGH (ref 0.57–1.00)
GFR calc Af Amer: 57 mL/min/{1.73_m2} — ABNORMAL LOW (ref >59)
GFR calc non Af Amer: 49 mL/min/{1.73_m2} — ABNORMAL LOW (ref >59)
Globulin, Total: 2.8 g/dL (ref 1.5–4.5)
Glucose: 88 mg/dL (ref 65–99)
Potassium: 4 mmol/L (ref 3.5–5.2)
Sodium: 145 mmol/L — ABNORMAL HIGH (ref 134–144)
Total Protein: 7.1 g/dL (ref 6.0–8.5)

## 2015-04-18 LAB — CBC WITH DIFFERENTIAL/PLATELET
Basophils Absolute: 0 10*3/uL (ref 0.0–0.2)
Basos: 0 %
EOS (ABSOLUTE): 0.1 10*3/uL (ref 0.0–0.4)
Eos: 1 %
Hematocrit: 33.4 % — ABNORMAL LOW (ref 34.0–46.6)
Hemoglobin: 10.7 g/dL — ABNORMAL LOW (ref 11.1–15.9)
Immature Grans (Abs): 0 10*3/uL (ref 0.0–0.1)
Immature Granulocytes: 0 %
Lymphocytes Absolute: 1.6 10*3/uL (ref 0.7–3.1)
Lymphs: 28 %
MCH: 30 pg (ref 26.6–33.0)
MCHC: 32 g/dL (ref 31.5–35.7)
MCV: 94 fL (ref 79–97)
Monocytes Absolute: 0.4 10*3/uL (ref 0.1–0.9)
Monocytes: 7 %
Neutrophils Absolute: 3.6 10*3/uL (ref 1.4–7.0)
Neutrophils: 64 %
Platelets: 246 10*3/uL (ref 150–379)
RBC: 3.57 x10E6/uL — ABNORMAL LOW (ref 3.77–5.28)
RDW: 13.4 % (ref 12.3–15.4)
WBC: 5.6 10*3/uL (ref 3.4–10.8)

## 2015-04-18 LAB — T4: T4, Total: 7.5 ug/dL (ref 4.5–12.0)

## 2015-04-18 LAB — FERRITIN: Ferritin: 27 ng/mL (ref 15–150)

## 2015-04-23 ENCOUNTER — Telehealth: Payer: Self-pay | Admitting: Family Medicine

## 2015-04-23 ENCOUNTER — Telehealth: Payer: Self-pay

## 2015-04-23 NOTE — Telephone Encounter (Signed)
Patient advised.

## 2015-04-23 NOTE — Telephone Encounter (Signed)
-----   Message from [place], [place] sent at 04/20/2015  6:28 PM EST ----- Blood counts and ferritin levels are a little low but stable. Still need iron supplement daily. Improvement in thyroid tests. Continue present medication regimen and recheck in 3-4 months.

## 2015-04-23 NOTE — Telephone Encounter (Signed)
Pt was in last Tuesday for vertigo.  She had lab work done and not heard anything back  Please advise.  She is still having dizziness.  Her call back is [phone]  Thanks Con Memos

## 2015-04-23 NOTE — Telephone Encounter (Signed)
Patient advised as directed below. Patient verbalized understanding and agrees with plan of care. Patient is requesting a RX for Meclizine 12.5 mg be sent to [facility]. Patient states she has been taking the medication for dizziness, but the RX has expired.

## 2015-04-24 MED ORDER — MECLIZINE HCL 25 MG PO TABS: 25.0000 mg | ORAL_TABLET | Freq: Three times a day (TID) | ORAL | Status: DC | PRN

## 2015-04-24 NOTE — Telephone Encounter (Signed)
May increase Meclizine to 25 mg 1-2 times a day for dizziness, anxiety or nausea. Should recheck in 7-10 days if anxiety or dizziness persists. Sent prescription to [facility]

## 2015-04-24 NOTE — Telephone Encounter (Signed)
Pt called to see if RX had been sent into [facility]. Pt is also asking for something  for anxiety. Please advise. Thanks TNP

## 2015-04-25 NOTE — Telephone Encounter (Signed)
Pt says she wants something separate for the anxiety.  Her call back is [phone]  Thanks Con Memos

## 2015-04-26 NOTE — Telephone Encounter (Signed)
Pt called back about wanting something to help with her anxiety other than the Meclizine. I advised pt that Simona Huh is out of the office on Wednesdays. Please advise. Thanks TNP

## 2015-04-26 NOTE — Telephone Encounter (Signed)
States she will try the Meclizine at 25 mg TID prn dizziness and/or anxiety and let us know how it works.

## 2015-05-05 ENCOUNTER — Other Ambulatory Visit: Payer: Self-pay | Admitting: Family Medicine

## 2015-05-09 ENCOUNTER — Telehealth: Payer: Self-pay | Admitting: Family Medicine

## 2015-05-09 NOTE — Telephone Encounter (Signed)
Pt stated she has been taking meclizine (ANTIVERT) 25 MG tablet as directed and it has helped her anxiety but not her dizziness. Pt wanted to know if she should try something else or increase the dose. Pt advised that Simona Huh is out of the office today. Thanks TNP

## 2015-05-10 NOTE — Telephone Encounter (Signed)
Please advise 

## 2015-05-11 ENCOUNTER — Other Ambulatory Visit: Payer: Self-pay

## 2015-05-11 MED ORDER — MECLIZINE HCL 25 MG PO TABS: 25.0000 mg | ORAL_TABLET | Freq: Four times a day (QID) | ORAL | Status: DC | PRN

## 2015-05-11 NOTE — Telephone Encounter (Signed)
Pt is returning call.  CB#[phone]/MW

## 2015-05-11 NOTE — Telephone Encounter (Signed)
Patient is requesting a refill on Meclizine 25 mg be sent to [facility].

## 2015-05-11 NOTE — Telephone Encounter (Signed)
Patient advised as directed below. Patient verbalized understanding.  

## 2015-05-11 NOTE — Telephone Encounter (Signed)
May use the Meclizine up to 4 times a day but if no better, should schedule recheck appointment. May need evaluation by ENT or neurologist.

## 2015-05-11 NOTE — Telephone Encounter (Signed)
LMTCB

## 2015-05-23 DIAGNOSIS — H35033 Hypertensive retinopathy, bilateral: Secondary | ICD-10-CM | POA: Diagnosis not present

## 2015-05-23 DIAGNOSIS — H25013 Cortical age-related cataract, bilateral: Secondary | ICD-10-CM | POA: Diagnosis not present

## 2015-05-23 DIAGNOSIS — H524 Presbyopia: Secondary | ICD-10-CM | POA: Diagnosis not present

## 2015-05-23 DIAGNOSIS — H5210 Myopia, unspecified eye: Secondary | ICD-10-CM | POA: Diagnosis not present

## 2015-06-04 ENCOUNTER — Ambulatory Visit: Payer: Commercial Managed Care - HMO | Admitting: Family Medicine

## 2015-06-27 ENCOUNTER — Encounter: Payer: Self-pay | Admitting: *Deleted

## 2015-07-04 DIAGNOSIS — H2511 Age-related nuclear cataract, right eye: Secondary | ICD-10-CM | POA: Diagnosis not present

## 2015-07-04 DIAGNOSIS — H2513 Age-related nuclear cataract, bilateral: Secondary | ICD-10-CM | POA: Diagnosis not present

## 2015-07-18 ENCOUNTER — Ambulatory Visit
Admission: RE | Admit: 2015-07-18 | Discharge: 2015-07-18 | Disposition: A | Discharge status: Home / Self Care | Payer: Commercial Managed Care - HMO | Source: Ambulatory Visit | Attending: Oncology | Admitting: Oncology

## 2015-07-18 ENCOUNTER — Inpatient Hospital Stay (HOSPITAL_BASED_OUTPATIENT_CLINIC_OR_DEPARTMENT_OTHER): Payer: Commercial Managed Care - HMO | Admitting: Oncology

## 2015-07-18 ENCOUNTER — Inpatient Hospital Stay: Payer: Commercial Managed Care - HMO | Attending: Oncology

## 2015-07-18 ENCOUNTER — Encounter: Payer: Self-pay | Admitting: Oncology

## 2015-07-18 VITALS — BP 151/110 | HR 68 | Temp 96.8°F | Resp 18 | Wt 184.1 lb

## 2015-07-18 DIAGNOSIS — F17210 Nicotine dependence, cigarettes, uncomplicated: Secondary | ICD-10-CM | POA: Diagnosis not present

## 2015-07-18 DIAGNOSIS — C180 Malignant neoplasm of cecum: Secondary | ICD-10-CM

## 2015-07-18 DIAGNOSIS — E079 Disorder of thyroid, unspecified: Secondary | ICD-10-CM | POA: Diagnosis not present

## 2015-07-18 DIAGNOSIS — Z9221 Personal history of antineoplastic chemotherapy: Secondary | ICD-10-CM

## 2015-07-18 DIAGNOSIS — E785 Hyperlipidemia, unspecified: Secondary | ICD-10-CM | POA: Diagnosis not present

## 2015-07-18 DIAGNOSIS — I517 Cardiomegaly: Secondary | ICD-10-CM | POA: Diagnosis not present

## 2015-07-18 DIAGNOSIS — Z79899 Other long term (current) drug therapy: Secondary | ICD-10-CM

## 2015-07-18 DIAGNOSIS — E039 Hypothyroidism, unspecified: Secondary | ICD-10-CM

## 2015-07-18 DIAGNOSIS — I10 Essential (primary) hypertension: Secondary | ICD-10-CM | POA: Insufficient documentation

## 2015-07-18 DIAGNOSIS — D649 Anemia, unspecified: Secondary | ICD-10-CM

## 2015-07-18 DIAGNOSIS — K589 Irritable bowel syndrome without diarrhea: Secondary | ICD-10-CM | POA: Insufficient documentation

## 2015-07-18 DIAGNOSIS — C189 Malignant neoplasm of colon, unspecified: Secondary | ICD-10-CM | POA: Diagnosis not present

## 2015-07-18 LAB — CBC WITH DIFFERENTIAL/PLATELET
Basophils Absolute: 0 10*3/uL (ref 0–0.1)
Basophils Relative: 1 %
Eosinophils Absolute: 0 10*3/uL (ref 0–0.7)
Eosinophils Relative: 1 %
HCT: 33.4 % — ABNORMAL LOW (ref 35.0–47.0)
Hemoglobin: 11.2 g/dL — ABNORMAL LOW (ref 12.0–16.0)
Lymphocytes Relative: 20 %
Lymphs Abs: 1 10*3/uL (ref 1.0–3.6)
MCH: 30.4 pg (ref 26.0–34.0)
MCHC: 33.3 g/dL (ref 32.0–36.0)
MCV: 91.2 fL (ref 80.0–100.0)
Monocytes Absolute: 0.3 10*3/uL (ref 0.2–0.9)
Monocytes Relative: 7 %
Neutro Abs: 3.5 10*3/uL (ref 1.4–6.5)
Neutrophils Relative %: 71 %
Platelets: 254 10*3/uL (ref 150–440)
RBC: 3.67 MIL/uL — ABNORMAL LOW (ref 3.80–5.20)
RDW: 14.2 % (ref 11.5–14.5)
WBC: 4.8 10*3/uL (ref 3.6–11.0)

## 2015-07-18 LAB — COMPREHENSIVE METABOLIC PANEL
ALT: 17 U/L (ref 14–54)
AST: 24 U/L (ref 15–41)
Albumin: 4.3 g/dL (ref 3.5–5.0)
Alkaline Phosphatase: 61 U/L (ref 38–126)
Anion gap: 8 (ref 5–15)
BUN: 12 mg/dL (ref 6–20)
CO2: 26 mmol/L (ref 22–32)
Calcium: 9.3 mg/dL (ref 8.9–10.3)
Chloride: 105 mmol/L (ref 101–111)
Creatinine, Ser: 0.71 mg/dL (ref 0.44–1.00)
GFR calc Af Amer: >60 mL/min (ref >60)
GFR calc non Af Amer: >60 mL/min (ref >60)
Glucose, Bld: 90 mg/dL (ref 65–99)
Potassium: 3.9 mmol/L (ref 3.5–5.1)
Sodium: 139 mmol/L (ref 135–145)
Total Bilirubin: 0.5 mg/dL (ref 0.3–1.2)
Total Protein: 8.1 g/dL (ref 6.5–8.1)

## 2015-07-18 NOTE — Progress Notes (Signed)
Melissa Fitzgerald @ [facility] [phone]  Fax:([phone]) [name]: [birth year]  MR#: [id number]  WVP#:[id number]  Patient Care Team: Melissa Common, PA as PCP - General (Physician Assistant) Melissa Lye, MD as Consulting Physician (General Surgery) Melissa Sails, MD as Referring Physician (Internal Medicine) Melissa Gleason, MD (Oncology)  CHIEF COMPLAINT:  Chief Complaint  Patient presents with  . Malignant neoplasm of cecum    Oncology History   70 year old lady who does not have any major medical problem was admitted in the hospital for anemia requiring blood transfusion.  Patient underwent colonoscopy.  In infiltrative  mass was found in the ascending colon .   1. 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 Jamal Collin Colonoscopy was done in June of 23, 2015 by Dr. Gustavo Lah. 2. Started adjuvant chemotherapy with FOLFOX on August 12th 2015 oxaliplatin was put on hold after 6 cycles because of neuropathy 3.send finished 12 cycles of chemotherapy with FOLFOX.  Last few cycle oxaliplatin was held because of neuropath     Cancer of colon ([place])   09/21/2013 Initial Diagnosis Cancer of colon    No flowsheet data found.  INTERVAL HISTORY: 70 year old lady with a history of carcinoma of colon came today further follow-up complains of burning at the site of the port in pain.  . Patient had upper endoscopy done on October of 2016 and was reported to be having gastritis.  Patient was started on Nexium.   Patient is here for ongoing evaluation regarding colon cancer.  Had a last colonoscopy in July of 2016. Patient continues to smoke complains of sometimes mucus system with blood.  Patient has a appointment with primary care physician regarding management of hypertension.  No cough.  No shortness of breath.  No rectal bleeding.  No abdominal pain. REVIEW OF  SYSTEMS:     general status  No change in a performance status.  No chills.  No fever. HEENT: Alopecia.  No evidence of stomatitis, occasional sinus drainage with mucus system with the blood Lungs: No cough or shortness of breath continues to smoke.  Cardiac: No chest pain or paroxysmal nocturnal dyspnea GI: No nausea no vomiting no diarrhea no abdominal pain Skin: No rash Lower extremity no swelling Neurological system: No tingling.  No numbness.  No other focal signs Musculoskeletal system no bony pains As per history of present illness As per HPI. Otherwise, a complete review of systems is negatve.  PAST MEDICAL HISTORY: Past Medical History  Diagnosis Date  . Irritable bowel disease   . Colon polyp   . Anemia   . Thyroid disease   . Hyperlipidemia   . Kidney stone   . Hypertension     PAST SURGICAL HISTORY: Past Surgical History  Procedure Laterality Date  . Colonoscopy  09-13-13    Dr [name]  . Tonsillectomy    . Kidney stone surgery    . Thyroid surgery    . Foot surgery    . Nasal sinus surgery    . Abdominal hysterectomy    . Cholecystectomy  2000  . Portacath placement  10/24/13  . Breast enhancement surgery    . Colonoscopy with propofol N/A 10/18/2014    Procedure: COLONOSCOPY WITH PROPOFOL;  Surgeon: Melissa Lye, MD;  Location: [facility] ENDOSCOPY;  Service: Endoscopy;  Laterality: N/A;  . Esophagogastroduodenoscopy (egd) with propofol N/A 01/03/2015  Procedure: ESOPHAGOGASTRODUODENOSCOPY (EGD) WITH PROPOFOL;  Surgeon: Melissa Lye, MD;  Location: [facility] ENDOSCOPY;  Service: Endoscopy;  Laterality: N/A;  . Colectomy  09/2013    FAMILY HISTORY Family History  Problem Relation Age of Onset  . Colon cancer      first cousin  . Breast cancer Maternal Aunt   . Breast cancer Paternal Aunt   . Heart disease Mother   . Diabetes Mother   . Arthritis Sister   . Hyperlipidemia Sister   . COPD Brother   . Diabetes Sister     ADVANCED  DIRECTIVES:  No flowsheet data found.  HEALTH MAINTENANCE: Social History  Substance Use Topics  . Smoking status: Current Some Day Smoker -- 0.25 packs/day    Types: Cigarettes  . Smokeless tobacco: None     Comment: still smoking every now and then, rarely one  . Alcohol Use: 0.0 oz/week    0 Standard drinks or equivalent per week     Comment: occasionally      Allergies  Allergen Reactions  . Codeine Nausea And Vomiting  . Oxycodone Nausea And Vomiting  . Penicillins Nausea And Vomiting  . Tramadol Nausea And Vomiting    Current Outpatient Prescriptions  Medication Sig Dispense Refill  . esomeprazole (NEXIUM) 40 MG capsule Take 1 capsule (40 mg total) by mouth daily at 12 noon. 30 capsule 3  . Ferrous Fumarate 324 (106 FE) MG TABS Take 1 tablet by mouth daily. 30 tablet 3  . levothyroxine (SYNTHROID, LEVOTHROID) 100 MCG tablet TAKE ONE TABLET BY MOUTH ONCE DAILY BEFORE  BREAKFAST 30 tablet 6  . meclizine (ANTIVERT) 25 MG tablet Take 1 tablet (25 mg total) by mouth 4 (four) times daily as needed for dizziness. Nausea or anxiety. 30 tablet 3  . metoprolol succinate (TOPROL-XL) 25 MG 24 hr tablet Take 1 tablet (25 mg total) by mouth daily. 30 tablet 5  . polyethylene glycol-electrolytes (NULYTELY/GOLYTELY) 420 G solution one bottle for colonoscopy prep 4000 mL 0  . BESIVANCE 0.6 % SUSP     . DUREZOL 0.05 % EMUL      No current facility-administered medications for this visit.    OBJECTIVE:  Filed Vitals:   07/18/15 1501  BP: 151/110  Pulse: 68  Temp: 96.8 F (36 C)  Resp: 18     Body mass index is 29.73 kg/(m^2).    ECOG FS:1 - Symptomatic but completely ambulatory  PHYSICAL EXAM: GENERAL:  Well developed, well nourished, sitting comfortably in the exam room in no acute distress. MENTAL STATUS:  Alert and oriented to person, place and time.  symmetric, no Cushingoid features.  ENT:  Oropharynx clear without lesion.  Tongue normal. Mucous membranes moist.    RESPIRATORY:  Clear to auscultation without rales, wheezes or rhonchi. CARDIOVASCULAR:  Regular rate and rhythm without murmur, rub or gallop.  ABDOMEN:  Soft, non-tender, with active bowel sounds, and no hepatosplenomegaly.  No masses. BACK:  No CVA tenderness.  No tenderness on percussion of the back or rib cage. SKIN:  No rashes, ulcers or lesions. EXTREMITIES: No edema, no skin discoloration or tenderness.  No palpable cords. LYMPH NODES: No palpable cervical, supraclavicular, axillary or inguinal adenopathy  NEUROLOGICAL: Unremarkable. PSYCH:  Appropriate.   LAB RESULTS:  Appointment on 07/18/2015  Component Date Value Ref Range Status  . WBC 07/18/2015 4.8  3.6 - 11.0 K/uL Final  . RBC 07/18/2015 3.67* 3.80 - 5.20 MIL/uL Final  . Hemoglobin 07/18/2015 11.2* 12.0 - 16.0 g/dL Final  .  HCT 07/18/2015 33.4* 35.0 - 47.0 % Final  . MCV 07/18/2015 91.2  80.0 - 100.0 fL Final  . MCH 07/18/2015 30.4  26.0 - 34.0 pg Final  . MCHC 07/18/2015 33.3  32.0 - 36.0 g/dL Final  . RDW 07/18/2015 14.2  11.5 - 14.5 % Final  . Platelets 07/18/2015 254  150 - 440 K/uL Final  . Neutrophils Relative % 07/18/2015 71   Final  . Neutro Abs 07/18/2015 3.5  1.4 - 6.5 K/uL Final  . Lymphocytes Relative 07/18/2015 20   Final  . Lymphs Abs 07/18/2015 1.0  1.0 - 3.6 K/uL Final  . Monocytes Relative 07/18/2015 7   Final  . Monocytes Absolute 07/18/2015 0.3  0.2 - 0.9 K/uL Final  . Eosinophils Relative 07/18/2015 1   Final  . Eosinophils Absolute 07/18/2015 0.0  0 - 0.7 K/uL Final  . Basophils Relative 07/18/2015 1   Final  . Basophils Absolute 07/18/2015 0.0  0 - 0.1 K/uL Final  . Sodium 07/18/2015 139  135 - 145 mmol/L Final  . Potassium 07/18/2015 3.9  3.5 - 5.1 mmol/L Final  . Chloride 07/18/2015 105  101 - 111 mmol/L Final  . CO2 07/18/2015 26  22 - 32 mmol/L Final  . Glucose, Bld 07/18/2015 90  65 - 99 mg/dL Final  . BUN 07/18/2015 12  6 - 20 mg/dL Final  . Creatinine, Ser 07/18/2015 0.71  0.44 -  1.00 mg/dL Final  . Calcium 07/18/2015 9.3  8.9 - 10.3 mg/dL Final  . Total Protein 07/18/2015 8.1  6.5 - 8.1 g/dL Final  . Albumin 07/18/2015 4.3  3.5 - 5.0 g/dL Final  . AST 07/18/2015 24  15 - 41 U/L Final  . ALT 07/18/2015 17  14 - 54 U/L Final  . Alkaline Phosphatase 07/18/2015 61  38 - 126 U/L Final  . Total Bilirubin 07/18/2015 0.5  0.3 - 1.2 mg/dL Final  . GFR calc non Af Amer 07/18/2015 >60  >60 mL/min Final  . GFR calc Af Amer 07/18/2015 >60  >60 mL/min Final   Comment: (NOTE) The eGFR has been calculated using the CKD EPI equation. This calculation has not been validated in all clinical situations. eGFR's persistently <60 mL/min signify possible Chronic Kidney Disease.   . Anion gap 07/18/2015 8  5 - 15 Final   CEA is pending  ASSESSMENT: Carcinoma of colon status post chemotherapy with FOLFOX Anemia with ferritin level is low Abdominal pain patient is getting colonoscopy done Hypothyroidism patient is on Synthroid 125 g.  MEDICAL DECISION MAKING:  Lab data has been reviewed. Anemia is iron deficiency will start patient on oral iron and recheck hemoglobin in  hemoglobin has improved.  Patient has colonoscopy done in July of 2016 Patient did not get any mammogram done in last year or so she has been scheduled. Chest x-ray has been done which has been evaluated and reviewed independently there is no evidence of pulmonary nodule Because of patient's continuing history of smoking even though she claims is intermittent patient has been referred to over screening clinic for CT screening  CEA is pending  Counseled regarding   Her smoking.  Regarding hypertension patient was advised to go and see primary care physician.  According to her she has an appointment coming next week  No matching staging information was found for the patient.  Melissa Gleason, MD   07/18/2015 3:40 PM

## 2015-07-19 ENCOUNTER — Encounter: Payer: Self-pay | Admitting: Oncology

## 2015-07-19 ENCOUNTER — Other Ambulatory Visit: Payer: Self-pay | Admitting: Family Medicine

## 2015-07-19 DIAGNOSIS — I10 Essential (primary) hypertension: Secondary | ICD-10-CM

## 2015-07-19 LAB — CEA: CEA: 1.6 ng/mL (ref 0.0–4.7)

## 2015-07-19 NOTE — Telephone Encounter (Signed)
Pt contacted office for refill request on the following medications:  metoprolol succinate (TOPROL-XL) 25 MG 24 hr tablet.  Melissa Fitzgerald  CB#[phone]/MW

## 2015-07-20 MED ORDER — METOPROLOL SUCCINATE ER 25 MG PO TB24: 25.0000 mg | ORAL_TABLET | Freq: Every day | ORAL | Status: DC

## 2015-07-24 ENCOUNTER — Encounter: Payer: Self-pay | Admitting: Family Medicine

## 2015-07-24 ENCOUNTER — Telehealth: Payer: Self-pay | Admitting: *Deleted

## 2015-07-24 ENCOUNTER — Ambulatory Visit (INDEPENDENT_AMBULATORY_CARE_PROVIDER_SITE_OTHER): Payer: Commercial Managed Care - HMO | Admitting: Family Medicine

## 2015-07-24 VITALS — BP 162/102 | HR 66 | Temp 98.5°F | Resp 16 | Wt 184.8 lb

## 2015-07-24 DIAGNOSIS — G479 Sleep disorder, unspecified: Secondary | ICD-10-CM | POA: Diagnosis not present

## 2015-07-24 DIAGNOSIS — F32A Depression, unspecified: Secondary | ICD-10-CM

## 2015-07-24 DIAGNOSIS — I10 Essential (primary) hypertension: Secondary | ICD-10-CM | POA: Diagnosis not present

## 2015-07-24 DIAGNOSIS — Z85038 Personal history of other malignant neoplasm of large intestine: Secondary | ICD-10-CM | POA: Diagnosis not present

## 2015-07-24 DIAGNOSIS — F329 Major depressive disorder, single episode, unspecified: Secondary | ICD-10-CM | POA: Diagnosis not present

## 2015-07-24 MED ORDER — METOPROLOL SUCCINATE ER 50 MG PO TB24: 50.0000 mg | ORAL_TABLET | Freq: Every day | ORAL | Status: DC

## 2015-07-24 MED ORDER — TRAZODONE HCL 50 MG PO TABS: 25.0000 mg | ORAL_TABLET | Freq: Every evening | ORAL | Status: DC | PRN

## 2015-07-24 NOTE — Telephone Encounter (Signed)
Received referral for low dose lung cancer screening CT scan. Voicemail left at phone number listed in EMR for patient to call me back to facilitate scheduling scan.  

## 2015-07-24 NOTE — Progress Notes (Signed)
Patient ID: ZIQI REIMER, female   DOB: 02/19/1946, 70 y.o.   MRN: [id number]   Patient: KAIYLA FRANCE Female    DOB: 1945/05/14   70 y.o.   MRN: [id number] Visit Date: 07/24/2015  Today's Provider: Vernie Murders, PA   Chief Complaint  Patient presents with  . Anxiety   Subjective:    Anxiety The problem has been waxing and waning. Symptoms include decreased concentration, depressed mood, dizziness, dry mouth, excessive worry, insomnia, irritability, malaise, nervous/anxious behavior and restlessness. Symptoms occur most days. The severity of symptoms is moderate. Exacerbated by: stress.   Past treatments include non-SSRI antidepressants. Compliance with prior treatments has been poor.   Patient Active Problem List   Diagnosis Date Noted  . History of colon cancer 03/22/2015  . 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 Anemia, iron deficiency 10/23/2014  . HLD (hyperlipidemia) 10/23/2014  . Essential (primary) hypertension 10/23/2014  . Acid reflux 10/23/2014  . Clinical depression 10/23/2014  . Peptic ulcer 10/23/2014  . Hypothyroidism, postop 10/23/2014  . Anemia 10/18/2014  . Arthritis 10/18/2014  . Chronic constipation 10/18/2014  . Sleep disturbance 10/18/2014  . Elevated liver enzymes 10/18/2014  . Chronic headache 10/18/2014  . History of renal calculi 10/18/2014  . Malignant neoplasm of cecum ([place]) 10/17/2013  . Cancer of colon ([place]) 09/21/2013  . Malignant neoplasm of colon ([place]) 09/21/2013   Past Surgical History  Procedure Laterality Date  . Colonoscopy  09-13-13    Dr [name]  . Tonsillectomy    . Kidney stone surgery    . Thyroid surgery    . Foot surgery    . Nasal sinus surgery    . Abdominal hysterectomy    . Cholecystectomy  2000  . Portacath placement  10/24/13  . Breast enhancement surgery    . Colonoscopy with propofol N/A 10/18/2014    Procedure: COLONOSCOPY WITH PROPOFOL;  Surgeon:  [name], MD;  Location: [facility] ENDOSCOPY;  Service: Endoscopy;  Laterality: N/A;  . Esophagogastroduodenoscopy (egd) with propofol N/A 01/03/2015    Procedure: ESOPHAGOGASTRODUODENOSCOPY (EGD) WITH PROPOFOL;  Surgeon: [name], MD;  Location: [facility] ENDOSCOPY;  Service: Endoscopy;  Laterality: N/A;  . Colectomy  09/2013   Family History  Problem Relation Age of Onset  . Colon cancer      first cousin  . Breast cancer Maternal Aunt   . Breast cancer Paternal Aunt   . Heart disease Mother   . Diabetes Mother   . Arthritis Sister   . Hyperlipidemia Sister   . COPD Brother   . Diabetes Sister    Previous Medications   BESIVANCE 0.6 % SUSP       DUREZOL 0.05 % EMUL       ESOMEPRAZOLE (NEXIUM) 40 MG CAPSULE    Take 1 capsule (40 mg total) by mouth daily at 12 noon.   FERROUS FUMARATE 324 (106 FE) MG TABS    Take 1 tablet by mouth daily.   LEVOTHYROXINE (SYNTHROID, LEVOTHROID) 100 MCG TABLET    TAKE ONE TABLET BY MOUTH ONCE DAILY BEFORE  BREAKFAST   MECLIZINE (ANTIVERT) 25 MG TABLET    Take 1 tablet (25 mg total) by mouth 4 (four) times daily as needed for dizziness. Nausea or anxiety.   METOPROLOL SUCCINATE (TOPROL-XL) 25 MG 24 HR TABLET    Take 1 tablet (25 mg total) by mouth daily.   POLYETHYLENE GLYCOL-ELECTROLYTES (NULYTELY/GOLYTELY) 420 G SOLUTION    one  bottle for colonoscopy prep   Allergies  Allergen Reactions  . Codeine Nausea And Vomiting  . Oxycodone Nausea And Vomiting  . Penicillins Nausea And Vomiting  . Tramadol Nausea And Vomiting    Review of Systems  Constitutional: Positive for appetite change, irritability and fatigue.  Eyes: Negative.   Respiratory: Negative.   Cardiovascular: Negative.   Gastrointestinal: Negative.   Endocrine: Negative.   Genitourinary: Negative.   Musculoskeletal: Negative.   Allergic/Immunologic: Negative.   Neurological: Positive for dizziness and headaches.       Tingling  Hematological: Negative.     Psychiatric/Behavioral: Positive for sleep disturbance and decreased concentration. The patient is nervous/anxious and has insomnia.     Social History  Substance Use Topics  . Smoking status: Current Some Day Smoker -- 0.25 packs/day    Types: Cigarettes  . Smokeless tobacco: Not on file     Comment: still smoking every now and then, rarely one  . Alcohol Use: 0.0 oz/week    0 Standard drinks or equivalent per week     Comment: occasionally   Objective:   BP 162/102 mmHg  Pulse 66  Temp(Src) 98.5 F (36.9 C) (Oral)  Resp 16  Wt 184 lb 12.8 oz (83.825 kg)  Physical Exam  Constitutional: She is oriented to person, place, and time. She appears well-developed and well-nourished. No distress.  HENT:  Head: Normocephalic and atraumatic.  Right Ear: Hearing normal.  Left Ear: Hearing normal.  Nose: Nose normal.  Eyes: Conjunctivae and lids are normal. Right eye exhibits no discharge. Left eye exhibits no discharge. No scleral icterus.  Neck: Neck supple.  Cardiovascular: Normal rate and regular rhythm.   Pulmonary/Chest: Effort normal and breath sounds normal. No respiratory distress.  Abdominal: Soft. Bowel sounds are normal.  Musculoskeletal: Normal range of motion.  Lymphadenopathy:    She has no cervical adenopathy.  Neurological: She is alert and oriented to person, place, and time.  Skin: Skin is intact. No lesion and no rash noted.  Psychiatric: She has a normal mood and affect. Her speech is normal and behavior is normal. Thought content normal.      Assessment & Plan:     1. Clinical depression Having episodes of skin tingling and irritability with anxiety over the past few weeks. Has been off the Trazodone and sleep is disturbed. Also, decreased concentration. Will refill Trazodone and recheck in 3 months. - traZODone (DESYREL) 50 MG tablet; Take 0.5-1 tablets (25-50 mg total) by mouth at bedtime as needed for sleep.  Dispense: 30 tablet; Refill: 3  2. History of  colon cancer States recent follow up with Dr. Oliva Bustard (oncologist) on 07-18-15 concerning history of colectomy in 2015 for cancer in cecum. States she has not had any melena or hematochezia. Found slight decrease in Hgb to 11.2 and encouraged to continue iron supplement daily.  3. Sleep disturbance Not sleeping well and been off the Trazodone for a while now. Will refill and recheck progress in 3 months. - traZODone (DESYREL) 50 MG tablet; Take 0.5-1 tablets (25-50 mg total) by mouth at bedtime as needed for sleep.  Dispense: 30 tablet; Refill: 3  4. Essential (primary) hypertension BP was up to 151/110 at Dr. Metro Kung office and 162.102 today. Has not had Toprol today but usually takes it every day. Will increase to 50 mg qd and recheck BP in 3 months. - metoprolol succinate (TOPROL-XL) 50 MG 24 hr tablet; Take 1 tablet (50 mg total) by mouth daily.  Dispense: 30  tablet; Refill: 3

## 2015-07-24 NOTE — Telephone Encounter (Signed)
Patient returned call and after discussing the lung cancer screening program, patient reports only smoking at times. Denies ever being an everyday smoker. Therefore, does not meet criteria for lung cancer screening scan.

## 2015-07-27 ENCOUNTER — Encounter: Payer: Self-pay | Admitting: *Deleted

## 2015-07-30 ENCOUNTER — Telehealth: Payer: Self-pay | Admitting: Family Medicine

## 2015-07-30 DIAGNOSIS — F411 Generalized anxiety disorder: Secondary | ICD-10-CM

## 2015-07-30 NOTE — Telephone Encounter (Signed)
Please review

## 2015-07-30 NOTE — Telephone Encounter (Signed)
Pt stated that traZODone (DESYREL) 50 MG tablet doesn't agree well with her system. Pt stated that she thinks it is causing her to feel dizzy and her head hurts. Pt stated that she feels dizzy after taking the medication before she falls asleep and she wakes up feeling dizzy and her head hurts. Pt stated that she is wanting to change this to something that will help with her anxiety level. Pt stated that she has anxiety through out the day. Pt stated that she doesn't really need anything to help her sleep she just needs something to help with her anxiety. Pharmacy: [facility]. Please advise. Thanks TNP

## 2015-07-31 ENCOUNTER — Ambulatory Visit: Payer: Commercial Managed Care - HMO

## 2015-07-31 MED ORDER — BUSPIRONE HCL 10 MG PO TABS: 10.0000 mg | ORAL_TABLET | Freq: Three times a day (TID) | ORAL | Status: DC | PRN

## 2015-07-31 NOTE — Telephone Encounter (Signed)
Any medications for anxiety may cause some sleepiness. Will give Buspirone and let us know if it works.

## 2015-07-31 NOTE — Telephone Encounter (Signed)
Pt has called back.  She is wanting something else called.  Thanks, [employer]

## 2015-08-01 NOTE — Telephone Encounter (Signed)
[id number] 

## 2015-08-07 DIAGNOSIS — H25813 Combined forms of age-related cataract, bilateral: Secondary | ICD-10-CM | POA: Diagnosis not present

## 2015-08-07 DIAGNOSIS — H2511 Age-related nuclear cataract, right eye: Secondary | ICD-10-CM | POA: Diagnosis not present

## 2015-08-07 NOTE — Telephone Encounter (Signed)
LMTCB

## 2015-08-08 DIAGNOSIS — H25012 Cortical age-related cataract, left eye: Secondary | ICD-10-CM | POA: Diagnosis not present

## 2015-08-08 DIAGNOSIS — H25042 Posterior subcapsular polar age-related cataract, left eye: Secondary | ICD-10-CM | POA: Diagnosis not present

## 2015-08-08 DIAGNOSIS — H2512 Age-related nuclear cataract, left eye: Secondary | ICD-10-CM | POA: Diagnosis not present

## 2015-08-10 NOTE — Telephone Encounter (Signed)
Left message with information below on patient's voicemail-aa

## 2015-08-14 ENCOUNTER — Other Ambulatory Visit: Payer: Self-pay | Admitting: Family Medicine

## 2015-08-21 ENCOUNTER — Encounter: Payer: Self-pay | Admitting: Emergency Medicine

## 2015-08-21 ENCOUNTER — Emergency Department
Admission: EM | Admit: 2015-08-21 | Discharge: 2015-08-21 | Disposition: A | Discharge status: Home / Self Care | Payer: Commercial Managed Care - HMO | Attending: Emergency Medicine | Admitting: Emergency Medicine

## 2015-08-21 DIAGNOSIS — I10 Essential (primary) hypertension: Secondary | ICD-10-CM | POA: Insufficient documentation

## 2015-08-21 DIAGNOSIS — M1990 Unspecified osteoarthritis, unspecified site: Secondary | ICD-10-CM | POA: Insufficient documentation

## 2015-08-21 DIAGNOSIS — R197 Diarrhea, unspecified: Secondary | ICD-10-CM | POA: Diagnosis not present

## 2015-08-21 DIAGNOSIS — F17210 Nicotine dependence, cigarettes, uncomplicated: Secondary | ICD-10-CM | POA: Diagnosis not present

## 2015-08-21 DIAGNOSIS — Z79899 Other long term (current) drug therapy: Secondary | ICD-10-CM | POA: Insufficient documentation

## 2015-08-21 DIAGNOSIS — Z85038 Personal history of other malignant neoplasm of large intestine: Secondary | ICD-10-CM | POA: Diagnosis not present

## 2015-08-21 DIAGNOSIS — E785 Hyperlipidemia, unspecified: Secondary | ICD-10-CM | POA: Diagnosis not present

## 2015-08-21 LAB — COMPREHENSIVE METABOLIC PANEL
ALT: 16 U/L (ref 14–54)
AST: 22 U/L (ref 15–41)
Albumin: 4 g/dL (ref 3.5–5.0)
Alkaline Phosphatase: 56 U/L (ref 38–126)
Anion gap: 7 (ref 5–15)
BUN: 11 mg/dL (ref 6–20)
CO2: 28 mmol/L (ref 22–32)
Calcium: 9 mg/dL (ref 8.9–10.3)
Chloride: 105 mmol/L (ref 101–111)
Creatinine, Ser: 0.83 mg/dL (ref 0.44–1.00)
GFR calc Af Amer: >60 mL/min (ref >60)
GFR calc non Af Amer: >60 mL/min (ref >60)
Glucose, Bld: 99 mg/dL (ref 65–99)
Potassium: 3.4 mmol/L — ABNORMAL LOW (ref 3.5–5.1)
Sodium: 140 mmol/L (ref 135–145)
Total Bilirubin: 0.4 mg/dL (ref 0.3–1.2)
Total Protein: 7.8 g/dL (ref 6.5–8.1)

## 2015-08-21 LAB — CBC
HCT: 33.9 % — ABNORMAL LOW (ref 35.0–47.0)
Hemoglobin: 11.3 g/dL — ABNORMAL LOW (ref 12.0–16.0)
MCH: 30.3 pg (ref 26.0–34.0)
MCHC: 33.3 g/dL (ref 32.0–36.0)
MCV: 90.7 fL (ref 80.0–100.0)
Platelets: 240 10*3/uL (ref 150–440)
RBC: 3.74 MIL/uL — ABNORMAL LOW (ref 3.80–5.20)
RDW: 13.6 % (ref 11.5–14.5)
WBC: 6 10*3/uL (ref 3.6–11.0)

## 2015-08-21 LAB — GASTROINTESTINAL PANEL BY PCR, STOOL (REPLACES STOOL CULTURE)

## 2015-08-21 LAB — C DIFFICILE QUICK SCREEN W PCR REFLEX
C Diff antigen: NEGATIVE
C Diff interpretation: NEGATIVE
C Diff toxin: NEGATIVE

## 2015-08-21 LAB — LIPASE, BLOOD: Lipase: 38 U/L (ref 11–51)

## 2015-08-21 NOTE — ED Notes (Signed)
MD at bedside. 

## 2015-08-21 NOTE — Discharge Instructions (Signed)
Diarrhea Diarrhea is frequent loose and watery bowel movements. It can cause you to feel weak and dehydrated. Dehydration can cause you to become tired and thirsty, have a dry mouth, and have decreased urination that often is dark yellow. Diarrhea is a sign of another problem, most often an infection that will not last long. In most cases, diarrhea typically lasts 2-3 days. However, it can last longer if it is a sign of something more serious. It is important to treat your diarrhea as directed by your caregiver to lessen or prevent future episodes of diarrhea. CAUSES  Some common causes include:  Gastrointestinal infections caused by viruses, bacteria, or parasites.  Food poisoning or food allergies.  Certain medicines, such as antibiotics, chemotherapy, and laxatives.  Artificial sweeteners and fructose.  Digestive disorders. HOME CARE INSTRUCTIONS  Ensure adequate fluid intake (hydration): Have 1 cup (8 oz) of fluid for each diarrhea episode. Avoid fluids that contain simple sugars or sports drinks, fruit juices, whole milk products, and sodas. Your urine should be clear or pale yellow if you are drinking enough fluids. Hydrate with an oral rehydration solution that you can purchase at pharmacies, retail stores, and online. You can prepare an oral rehydration solution at home by mixing the following ingredients together:   - tsp table salt.   tsp baking soda.   tsp salt substitute containing potassium chloride.  1  tablespoons sugar.  1 L (34 oz) of water.  Certain foods and beverages may increase the speed at which food moves through the gastrointestinal (GI) tract. These foods and beverages should be avoided and include:  Caffeinated and alcoholic beverages.  High-fiber foods, such as raw fruits and vegetables, nuts, seeds, and whole grain breads and cereals.  Foods and beverages sweetened with sugar alcohols, such as xylitol, sorbitol, and mannitol.  Some foods may be well  tolerated and may help thicken stool including:  Starchy foods, such as rice, toast, pasta, low-sugar cereal, oatmeal, grits, baked potatoes, crackers, and bagels.  Bananas.  Applesauce.  Add probiotic-rich foods to help increase healthy bacteria in the GI tract, such as yogurt and fermented milk products.  Wash your hands well after each diarrhea episode.  Only take over-the-counter or prescription medicines as directed by your caregiver.  Take a warm bath to relieve any burning or pain from frequent diarrhea episodes. SEEK IMMEDIATE MEDICAL CARE IF:   You are unable to keep fluids down.  You have persistent vomiting.  You have blood in your stool, or your stools are black and tarry.  You do not urinate in 6-8 hours, or there is only a small amount of very dark urine.  You have abdominal pain that increases or localizes.  You have weakness, dizziness, confusion, or light-headedness.  You have a severe headache.  Your diarrhea gets worse or does not get better.  You have a fever or persistent symptoms for more than 2-3 days.  You have a fever and your symptoms suddenly get worse. MAKE SURE YOU:   Understand these instructions.  Will watch your condition.  Will get help right away if you are not doing well or get worse.   This information is not intended to replace advice given to you by your health care provider. Make sure you discuss any questions you have with your health care provider.   Document Released: 02/28/2002 Document Revised: 03/31/2014 Document Reviewed: 11/16/2011 Elsevier Interactive Patient Education [employer].  Please return immediately if condition worsens. Please contact her primary physician  or the physician you were given for referral. If you have any specialist physicians involved in her treatment and plan please also contact them. Thank you for using [facility]. Please contact her oncologist and  gastroenterologist for further outpatient follow-up. Now that we have a stool sample we can begin to take Imodium over-the-counter as needed. Please return to emergency department especially if he have bloody diarrhea, high fever, increasing abdominal pain, or any other new concerns Food Choices to Help Relieve Diarrhea, Adult When you have diarrhea, the foods you eat and your eating habits are very important. Choosing the right foods and drinks can help relieve diarrhea. Also, because diarrhea can last up to 7 days, you need to replace lost fluids and electrolytes (such as sodium, potassium, and chloride) in order to help prevent dehydration.  WHAT GENERAL GUIDELINES DO I NEED TO FOLLOW?  Slowly drink 1 cup (8 oz) of fluid for each episode of diarrhea. If you are getting enough fluid, your urine will be clear or pale yellow.  Eat starchy foods. Some good choices include white rice, white toast, pasta, low-fiber cereal, baked potatoes (without the skin), saltine crackers, and bagels.  Avoid large servings of any cooked vegetables.  Limit fruit to two servings per day. A serving is  cup or 1 small piece.  Choose foods with less than 2 g of fiber per serving.  Limit fats to less than 8 tsp (38 g) per day.  Avoid fried foods.  Eat foods that have probiotics in them. Probiotics can be found in certain dairy products.  Avoid foods and beverages that may increase the speed at which food moves through the stomach and intestines (gastrointestinal tract). Things to avoid include:  High-fiber foods, such as dried fruit, raw fruits and vegetables, nuts, seeds, and whole grain foods.  Spicy foods and high-fat foods.  Foods and beverages sweetened with high-fructose corn syrup, honey, or sugar alcohols such as xylitol, sorbitol, and mannitol. WHAT FOODS ARE RECOMMENDED? Grains White rice. White, [place], or pita breads (fresh or toasted), including plain rolls, buns, or bagels. White pasta. Saltine,  soda, or graham crackers. Pretzels. Low-fiber cereal. Cooked cereals made with water (such as cornmeal, farina, or cream cereals). Plain muffins. Matzo. Melba toast. Zwieback.  Vegetables Potatoes (without the skin). Strained tomato and vegetable juices. Most well-cooked and canned vegetables without seeds. Tender lettuce. Fruits Cooked or canned applesauce, apricots, cherries, fruit cocktail, grapefruit, peaches, pears, or plums. Fresh bananas, apples without skin, cherries, grapes, cantaloupe, grapefruit, peaches, oranges, or plums.  Meat and Other Protein Products Baked or boiled chicken. Eggs. Tofu. Fish. Seafood. Smooth peanut butter. Ground or well-cooked tender beef, ham, veal, lamb, pork, or poultry.  Dairy Plain yogurt, kefir, and unsweetened liquid yogurt. Lactose-free milk, buttermilk, or soy milk. Plain hard cheese. Beverages Sport drinks. Clear broths. Diluted fruit juices (except prune). Regular, caffeine-free sodas such as ginger ale. Water. Decaffeinated teas. Oral rehydration solutions. Sugar-free beverages not sweetened with sugar alcohols. Other Bouillon, broth, or soups made from recommended foods.  The items listed above may not be a complete list of recommended foods or beverages. Contact your dietitian for more options. WHAT FOODS ARE NOT RECOMMENDED? Grains Whole grain, whole wheat, bran, or rye breads, rolls, pastas, crackers, and cereals. Wild or brown rice. Cereals that contain more than 2 g of fiber per serving. Corn tortillas or taco shells. Cooked or dry oatmeal. Granola. Popcorn. Vegetables Raw vegetables. Cabbage, broccoli, Brussels sprouts, artichokes, baked beans, beet greens, corn,  kale, legumes, peas, sweet potatoes, and yams. Potato skins. Cooked spinach and cabbage. Fruits Dried fruit, including raisins and dates. Raw fruits. Stewed or dried prunes. Fresh apples with skin, apricots, mangoes, pears, raspberries, and strawberries.  Meat and Other Protein  Products Chunky peanut butter. Nuts and seeds. Beans and lentils. Berniece Salines.  Dairy High-fat cheeses. Milk, chocolate milk, and beverages made with milk, such as milk shakes. Cream. Ice cream. Sweets and Desserts Sweet rolls, doughnuts, and sweet breads. Pancakes and waffles. Fats and Oils Butter. Cream sauces. Margarine. Salad oils. Plain salad dressings. Olives. Avocados.  Beverages Caffeinated beverages (such as coffee, tea, soda, or energy drinks). Alcoholic beverages. Fruit juices with pulp. Prune juice. Soft drinks sweetened with high-fructose corn syrup or sugar alcohols. Other Coconut. Hot sauce. Chili powder. Mayonnaise. Gravy. Cream-based or milk-based soups.  The items listed above may not be a complete list of foods and beverages to avoid. Contact your dietitian for more information. WHAT SHOULD I DO IF I BECOME DEHYDRATED? Diarrhea can sometimes lead to dehydration. Signs of dehydration include dark urine and dry mouth and skin. If you think you are dehydrated, you should rehydrate with an oral rehydration solution. These solutions can be purchased at pharmacies, retail stores, or online.  Drink -1 cup (120-240 mL) of oral rehydration solution each time you have an episode of diarrhea. If drinking this amount makes your diarrhea worse, try drinking smaller amounts more often. For example, drink 1-3 tsp (5-15 mL) every 5-10 minutes.  A general rule for staying hydrated is to drink 1-2 L of fluid per day. Talk to your health care provider about the specific amount you should be drinking each day. Drink enough fluids to keep your urine clear or pale yellow.   This information is not intended to replace advice given to you by your health care provider. Make sure you discuss any questions you have with your health care provider.   Document Released: 05/31/2003 Document Revised: 03/31/2014 Document Reviewed: 01/31/2013 Elsevier Interactive Patient Education [employer].

## 2015-08-21 NOTE — ED Notes (Signed)
Pt presents with diarrhea for three months and wants to find out what is going on.

## 2015-08-22 NOTE — ED Provider Notes (Signed)
Time Seen: Approximately 2050  I have reviewed the triage notes  Chief Complaint: Diarrhea   History of Present Illness: Melissa Fitzgerald is a 70 y.o. female who states that she has a history of colon cancer and had surgery along with chemotherapy. She has finished all of her chemotherapy and initially developed chronic constipation and had this in July 2016. He states now that she's had loose watery stools with meals for the last 2-3 months. She denies any fever or bloody diarrhea. She is currently not on any antibiotic therapy or had recent travel. She is not aware of any obvious foodborne exposure. She denies any syncope but came here with concerns that she may be dehydrated.   Past Medical History  Diagnosis Date  . Irritable bowel disease   . Colon polyp   . Anemia   . Thyroid disease   . Hyperlipidemia   . Kidney stone   . Hypertension     Patient Active Problem List   Diagnosis Date Noted  . History of colon cancer 03/22/2015  . 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 Anemia, iron deficiency 10/23/2014  . HLD (hyperlipidemia) 10/23/2014  . Essential (primary) hypertension 10/23/2014  . Acid reflux 10/23/2014  . Clinical depression 10/23/2014  . Peptic ulcer 10/23/2014  . Hypothyroidism, postop 10/23/2014  . Anemia 10/18/2014  . Arthritis 10/18/2014  . Chronic constipation 10/18/2014  . Sleep disturbance 10/18/2014  . Elevated liver enzymes 10/18/2014  . Chronic headache 10/18/2014  . History of renal calculi 10/18/2014  . Malignant neoplasm of cecum ([place]) 10/17/2013  . Cancer of colon ([place]) 09/21/2013  . Malignant neoplasm of colon ([place]) 09/21/2013    Past Surgical History  Procedure Laterality Date  . Colonoscopy  09-13-13    Dr [name]  . Tonsillectomy    . Kidney stone surgery    . Thyroid surgery    . Foot surgery    . Nasal sinus surgery    . Abdominal hysterectomy    . Cholecystectomy  2000  .  Portacath placement  10/24/13  . Breast enhancement surgery    . Colonoscopy with propofol N/A 10/18/2014    Procedure: COLONOSCOPY WITH PROPOFOL;  Surgeon: [name], MD;  Location: [facility] ENDOSCOPY;  Service: Endoscopy;  Laterality: N/A;  . Esophagogastroduodenoscopy (egd) with propofol N/A 01/03/2015    Procedure: ESOPHAGOGASTRODUODENOSCOPY (EGD) WITH PROPOFOL;  Surgeon: [name], MD;  Location: [facility] ENDOSCOPY;  Service: Endoscopy;  Laterality: N/A;  . Colectomy  09/2013    Past Surgical History  Procedure Laterality Date  . Colonoscopy  09-13-13    Dr [name]  . Tonsillectomy    . Kidney stone surgery    . Thyroid surgery    . Foot surgery    . Nasal sinus surgery    . Abdominal hysterectomy    . Cholecystectomy  2000  . Portacath placement  10/24/13  . Breast enhancement surgery    . Colonoscopy with propofol N/A 10/18/2014    Procedure: COLONOSCOPY WITH PROPOFOL;  Surgeon: [name], MD;  Location: [facility] ENDOSCOPY;  Service: Endoscopy;  Laterality: N/A;  . Esophagogastroduodenoscopy (egd) with propofol N/A 01/03/2015    Procedure: ESOPHAGOGASTRODUODENOSCOPY (EGD) WITH PROPOFOL;  Surgeon: [name], MD;  Location: [facility] ENDOSCOPY;  Service: Endoscopy;  Laterality: N/A;  . Colectomy  09/2013    Current Outpatient Rx  Name  Route  Sig  Dispense  Refill  . BESIVANCE 0.6 % SUSP  Dispense as written.   . busPIRone (BUSPAR) 10 MG tablet      TAKE ONE TABLET BY MOUTH THREE TIMES DAILY AS NEEDED   30 tablet   2   . DUREZOL 0.05 % EMUL                 Dispense as written.   Marland Kitchen esomeprazole (NEXIUM) 40 MG capsule   Oral   Take 1 capsule (40 mg total) by mouth daily at 12 noon.   30 capsule   3   . Ferrous Fumarate 324 (106 FE) MG TABS   Oral   Take 1 tablet by mouth daily.   30 tablet   3   . levothyroxine (SYNTHROID, LEVOTHROID) 100 MCG tablet      TAKE ONE TABLET BY MOUTH ONCE DAILY BEFORE  BREAKFAST    30 tablet   6   . meclizine (ANTIVERT) 25 MG tablet   Oral   Take 1 tablet (25 mg total) by mouth 4 (four) times daily as needed for dizziness. Nausea or anxiety. Patient not taking: Reported on 07/24/2015   30 tablet   3   . metoprolol succinate (TOPROL-XL) 50 MG 24 hr tablet   Oral   Take 1 tablet (50 mg total) by mouth daily.   30 tablet   3   . polyethylene glycol-electrolytes (NULYTELY/GOLYTELY) 420 G solution      one bottle for colonoscopy prep   4000 mL   0   . traZODone (DESYREL) 50 MG tablet   Oral   Take 0.5-1 tablets (25-50 mg total) by mouth at bedtime as needed for sleep.   30 tablet   3     Allergies:  Codeine; Oxycodone; Penicillins; and Tramadol  Family History: Family History  Problem Relation Age of Onset  . Colon cancer      first cousin  . Breast cancer Maternal Aunt   . Breast cancer Paternal Aunt   . Heart disease Mother   . Diabetes Mother   . Arthritis Sister   . Hyperlipidemia Sister   . COPD Brother   . Diabetes Sister     Social History: Social History  Substance Use Topics  . Smoking status: Current Some Day Smoker -- 0.25 packs/day    Types: Cigarettes  . Smokeless tobacco: None     Comment: still smoking every now and then, rarely one  . Alcohol Use: 0.0 oz/week    0 Standard drinks or equivalent per week     Comment: occasionally     Review of Systems:   10 point review of systems was performed and was otherwise negative:  Constitutional: No fever Eyes: No visual disturbances ENT: No sore throat, ear pain Cardiac: No chest pain Respiratory: No shortness of breath, wheezing, or stridor Abdomen: No abdominal pain, no vomiting, No diarrhea Endocrine: No weight loss, No night sweats Extremities: No peripheral edema, cyanosis Skin: No rashes, easy bruising Neurologic: No focal weakness, trouble with speech or swollowing Urologic: No dysuria, Hematuria, or urinary frequency   Physical Exam:  ED Triage Vitals  Enc  Vitals Group     BP 08/21/15 1821 166/103 mmHg     Pulse Rate 08/21/15 1821 71     Resp 08/21/15 1821 20     Temp 08/21/15 1821 98.3 F (36.8 C)     Temp src --      SpO2 08/21/15 1821 97 %     Weight 08/21/15 1821 184 lb (83.462 kg)  Height 08/21/15 1821 5\' 6"  (1.676 m)     Head Cir --      Peak Flow --      Pain Score --      Pain Loc --      Pain Edu? --      Excl. in [place]? --     General: Awake , Alert , and Oriented times 3; GCS 15 Head: Normal cephalic , atraumatic Eyes: Pupils equal , round, reactive to light Nose/Throat: No nasal drainage, patent upper airway without erythema or exudate.  Neck: Supple, Full range of motion, No anterior adenopathy or palpable thyroid masses Lungs: Clear to ascultation without wheezes , rhonchi, or rales Heart: Regular rate, regular rhythm without murmurs , gallops , or rubs Abdomen: Soft, non tender without rebound, guarding , or rigidity; bowel sounds positive and symmetric in all 4 quadrants. No organomegaly .        Extremities: 2 plus symmetric pulses. No edema, clubbing or cyanosis Neurologic: normal ambulation, Motor symmetric without deficits, sensory intact Skin: warm, dry, no rashes   Labs:   All laboratory work was reviewed including any pertinent negatives or positives listed below:  Labs Reviewed  COMPREHENSIVE METABOLIC PANEL - Abnormal; Notable for the following:    Potassium 3.4 (*)    All other components within normal limits  CBC - Abnormal; Notable for the following:    RBC 3.74 (*)    Hemoglobin 11.3 (*)    HCT 33.9 (*)    All other components within normal limits  C DIFFICILE QUICK SCREEN W PCR REFLEX  GASTROINTESTINAL PANEL BY PCR, STOOL (REPLACES STOOL CULTURE)  LIPASE, BLOOD  Patient's stool sample was negative for C. difficile and also stool pathogens.   ED Course: * Advised patient now is stool sample had been acquired that she could start over-the-counter Imodium but should touch base with her  oncologist. Changes in bowel habits with a history of colon cancer require further investigation possible endoscopic exam. Patient appears to be of understanding was advised to return here if she develops a fever, bloody diarrhea, increasing abdominal pain. Nausea vomiting weight loss, etc. I felt imaging was not necessary. For today's visit.   Assessment: * Diarrhea, unspecified type   Final Clinical Impression:  Final diagnoses:  Diarrhea, unspecified type     Plan: Outpatient management Patient was advised to return immediately if condition worsens. Patient was advised to follow up with their primary care physician or other specialized physicians involved in their outpatient care. The patient and/or family member/power of attorney had laboratory results reviewed at the bedside. All questions and concerns were addressed and appropriate discharge instructions were distributed by the nursing staff.            Daymon Larsen, MD 08/22/15 [phone]

## 2015-08-24 ENCOUNTER — Telehealth: Payer: Self-pay | Admitting: Family Medicine

## 2015-08-24 MED ORDER — BUSPIRONE HCL 15 MG PO TABS
15.0000 mg | ORAL_TABLET | Freq: Three times a day (TID) | ORAL | Status: DC
Start: 2015-08-24 — End: 2015-10-30

## 2015-08-24 NOTE — Telephone Encounter (Signed)
Patient saw Simona Huh early in May she wast started on Trazodone she called back around 5/14 and said this made her feel very groggy and tired, so we started her on Buspar and she states this has helped some her insomnia but her anxiety is not controlled and having hard time with that. She has not tried any other medications except for Ambien for sleep but due to insurance she had to stop. Patient wanted to see what other medication she could try, she wanted to have this answered today if possible instead of waiting for dennis on Monday. Thank you-aa

## 2015-08-24 NOTE — Telephone Encounter (Signed)
Pt called saying the busPIRone (BUSPAR) 10 MG tablet is not helping with her anxiety.  It does help her to sleep but not with the anxiety.    She cant come in for several weeks.  She has several upcoming appts.and eye surgery.  She would like someone to call her.  She knows Dennisis out of the office until Monday 6/5.  Pt's call back is [phone]  Thanks Con Memos

## 2015-08-24 NOTE — Telephone Encounter (Signed)
This is not something we can treat over the phone. Can increase Buspar to BID. Thanks.

## 2015-08-24 NOTE — Telephone Encounter (Signed)
Pt advised; she already take Buspar three times a day.  After talking to Dr. Venia Minks will increase to Buspar 15 mg TID.  RX sent and pt advised.   Thanks,   -Mickel Baas

## 2015-09-04 DIAGNOSIS — H2512 Age-related nuclear cataract, left eye: Secondary | ICD-10-CM | POA: Diagnosis not present

## 2015-09-05 DIAGNOSIS — H25813 Combined forms of age-related cataract, bilateral: Secondary | ICD-10-CM | POA: Diagnosis not present

## 2015-09-18 ENCOUNTER — Ambulatory Visit: Payer: Commercial Managed Care - HMO | Admitting: General Surgery

## 2015-10-30 ENCOUNTER — Ambulatory Visit (INDEPENDENT_AMBULATORY_CARE_PROVIDER_SITE_OTHER): Payer: Commercial Managed Care - HMO | Admitting: Family Medicine

## 2015-10-30 ENCOUNTER — Encounter: Payer: Self-pay | Admitting: Family Medicine

## 2015-10-30 VITALS — BP 150/90 | HR 80 | Temp 98.0°F | Resp 20 | Ht 66.0 in | Wt 183.0 lb

## 2015-10-30 DIAGNOSIS — F329 Major depressive disorder, single episode, unspecified: Secondary | ICD-10-CM

## 2015-10-30 DIAGNOSIS — I10 Essential (primary) hypertension: Secondary | ICD-10-CM

## 2015-10-30 DIAGNOSIS — F32A Depression, unspecified: Secondary | ICD-10-CM

## 2015-10-30 DIAGNOSIS — E890 Postprocedural hypothyroidism: Secondary | ICD-10-CM | POA: Diagnosis not present

## 2015-10-30 DIAGNOSIS — Z85038 Personal history of other malignant neoplasm of large intestine: Secondary | ICD-10-CM

## 2015-10-30 MED ORDER — VENLAFAXINE HCL ER 75 MG PO CP24: 75.0000 mg | ORAL_CAPSULE | Freq: Every day | ORAL | 3 refills | Status: DC

## 2015-10-30 NOTE — Patient Instructions (Signed)
Major Depressive Disorder Major depressive disorder is a mental illness. It also may be called clinical depression or unipolar depression. Major depressive disorder usually causes feelings of sadness, hopelessness, or helplessness. Some people with this disorder do not feel particularly sad but lose interest in doing things they used to enjoy (anhedonia). Major depressive disorder also can cause physical symptoms. It can interfere with work, school, relationships, and other normal everyday activities. The disorder varies in severity but is longer lasting and more serious than the sadness we all feel from time to time in our lives. Major depressive disorder often is triggered by stressful life events or major life changes. Examples of these triggers include divorce, loss of your job or home, a move, and the death of a family member or close friend. Sometimes this disorder occurs for no obvious reason at all. People who have family members with major depressive disorder or bipolar disorder are at higher risk for developing this disorder, with or without life stressors. Major depressive disorder can occur at any age. It may occur just once in your life (single episode major depressive disorder). It may occur multiple times (recurrent major depressive disorder). SYMPTOMS People with major depressive disorder have either anhedonia or depressed mood on nearly a daily basis for at least 2 weeks or longer. Symptoms of depressed mood include:  Feelings of sadness (blue or down in the dumps) or emptiness.  Feelings of hopelessness or helplessness.  Tearfulness or episodes of crying (may be observed by others).  Irritability (children and adolescents). In addition to depressed mood or anhedonia or both, people with this disorder have at least four of the following symptoms:  Difficulty sleeping or sleeping too much.   Significant change (increase or decrease) in appetite or weight.   Lack of energy or  motivation.  Feelings of guilt and worthlessness.   Difficulty concentrating, remembering, or making decisions.  Unusually slow movement (psychomotor retardation) or restlessness (as observed by others).   Recurrent wishes for death, recurrent thoughts of self-harm (suicide), or a suicide attempt. People with major depressive disorder commonly have persistent negative thoughts about themselves, other people, and the world. People with severe major depressive disorder may experiencedistorted beliefs or perceptions about the world (psychotic delusions). They also may see or hear things that are not real (psychotic hallucinations). DIAGNOSIS Major depressive disorder is diagnosed through an assessment by your health care provider. Your health care provider will ask aboutaspects of your daily life, such as mood,sleep, and appetite, to see if you have the diagnostic symptoms of major depressive disorder. Your health care provider may ask about your medical history and use of alcohol or drugs, including prescription medicines. Your health care provider also may do a physical exam and blood work. This is because certain medical conditions and the use of certain substances can cause major depressive disorder-like symptoms (secondary depression). Your health care provider also may refer you to a mental health specialist for further evaluation and treatment. TREATMENT It is important to recognize the symptoms of major depressive disorder and seek treatment. The following treatments can be prescribed for this disorder:   Medicine. Antidepressant medicines usually are prescribed. Antidepressant medicines are thought to correct chemical imbalances in the brain that are commonly associated with major depressive disorder. Other types of medicine may be added if the symptoms do not respond to antidepressant medicines alone or if psychotic delusions or hallucinations occur.  Talk therapy. Talk therapy can be  helpful in treating major depressive disorder by providing   support, education, and guidance. Certain types of talk therapy also can help with negative thinking (cognitive behavioral therapy) and with relationship issues that trigger this disorder (interpersonal therapy). A mental health specialist can help determine which treatment is best for you. Most people with major depressive disorder do well with a combination of medicine and talk therapy. Treatments involving electrical stimulation of the brain can be used in situations with extremely severe symptoms or when medicine and talk therapy do not work over time. These treatments include electroconvulsive therapy, transcranial magnetic stimulation, and vagal nerve stimulation.   This information is not intended to replace advice given to you by your health care provider. Make sure you discuss any questions you have with your health care provider.   Document Released: 07/05/2012 Document Revised: 03/31/2014 Document Reviewed: 07/05/2012 Elsevier Interactive Patient Education 2016 Elsevier Inc.  

## 2015-10-30 NOTE — Progress Notes (Signed)
Patient: Melissa Fitzgerald Female    DOB: [birth year]   70 y.o.   MRN: [id number] Visit Date: 10/30/2015  Today's Provider: Vernie Murders, PA   Chief Complaint  Patient presents with  . Depression  . Hypertension   Subjective:    HPI  Depression, Follow-up  She  was last seen for this 3 months ago. Changes made at last visit include refilled Trazodone.   She reports poor compliance with treatment. She is having side effects. nausea  She reports poor tolerance of treatment. Current symptoms include: depressed mood She feels she is Unchanged since last visit. Patient reports that she has been off medications for several weeks.  ------------------------------------------------------------------------    Hypertension, follow-up:  BP Readings from Last 3 Encounters:  10/30/15 (!) 150/90  08/21/15 (!) 163/98  07/24/15 (!) 162/102    She was last seen for hypertension 3 months ago.  BP at that visit was 163/98. Management changes since that visit include increase Metoprolol to 50 mg. She reports excellent compliance with treatment. She is not having side effects.  She is not exercising. She is adherent to low salt diet.   Outside blood pressures are stable. She is experiencing none.  Patient denies chest pain.   Cardiovascular risk factors include hypertension and smoking/ tobacco exposure.  Use of agents associated with hypertension: none.     Weight trend: increasing steadily Wt Readings from Last 3 Encounters:  10/30/15 183 lb (83 kg)  08/21/15 184 lb (83.5 kg)  07/24/15 184 lb 12.8 oz (83.8 kg)    Current diet: in general, a "healthy" diet    ------------------------------------------------------------------------  Diarrhea: Patient reports diarrhea on and off for 3 months. Patient has been seen at ER and was told that her chemo treatments maybe causing symptoms. Patient reports that she has been taking OTC probiotics and antidiarrhea medications on  and off, reports mild improvement with symptoms.       Past Medical History:  Diagnosis Date  . Anemia   . Colon polyp   . Hyperlipidemia   . Hypertension   . Irritable bowel disease   . Kidney stone   . Thyroid disease    Patient Active Problem List   Diagnosis Date Noted  . History of colon cancer 03/22/2015  . 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 Anemia, iron deficiency 10/23/2014  . HLD (hyperlipidemia) 10/23/2014  . Essential (primary) hypertension 10/23/2014  . Acid reflux 10/23/2014  . Clinical depression 10/23/2014  . Peptic ulcer 10/23/2014  . Hypothyroidism, postop 10/23/2014  . Anemia 10/18/2014  . Arthritis 10/18/2014  . Chronic constipation 10/18/2014  . Sleep disturbance 10/18/2014  . Elevated liver enzymes 10/18/2014  . Chronic headache 10/18/2014  . History of renal calculi 10/18/2014  . Malignant neoplasm of cecum ([place]) 10/17/2013  . Cancer of colon ([place]) 09/21/2013  . Malignant neoplasm of colon ([place]) 09/21/2013   Past Surgical History:  Procedure Laterality Date  . ABDOMINAL HYSTERECTOMY    . BREAST ENHANCEMENT SURGERY    . CHOLECYSTECTOMY  2000  . COLECTOMY  09/2013  . COLONOSCOPY  09-13-13   Dr [name]  . COLONOSCOPY WITH PROPOFOL N/A 10/18/2014   Procedure: COLONOSCOPY WITH PROPOFOL;  Surgeon: [name], MD;  Location: [facility] ENDOSCOPY;  Service: Endoscopy;  Laterality: N/A;  . ESOPHAGOGASTRODUODENOSCOPY (EGD) WITH PROPOFOL N/A 01/03/2015   Procedure: ESOPHAGOGASTRODUODENOSCOPY (EGD) WITH PROPOFOL;  Surgeon: [name], MD;  Location: [facility] ENDOSCOPY;  Service: Endoscopy;  Laterality: N/A;  . FOOT SURGERY    . KIDNEY STONE SURGERY    . NASAL SINUS SURGERY    . PORTACATH PLACEMENT  10/24/13  . THYROID SURGERY    . TONSILLECTOMY     Family History  Problem Relation Age of Onset  . Heart disease Mother   . Diabetes Mother   . Arthritis Sister   . Hyperlipidemia Sister    . COPD Brother   . Diabetes Sister   . Colon cancer      first cousin  . Breast cancer Maternal Aunt   . Breast cancer Paternal Aunt    Allergies  Allergen Reactions  . Codeine Nausea And Vomiting  . Oxycodone Nausea And Vomiting  . Penicillins Nausea And Vomiting  . Tramadol Nausea And Vomiting   Current Meds  Medication Sig  . esomeprazole (NEXIUM) 40 MG capsule Take 1 capsule (40 mg total) by mouth daily at 12 noon.  . Ferrous Fumarate 324 (106 FE) MG TABS Take 1 tablet by mouth daily.  Marland Kitchen levothyroxine (SYNTHROID, LEVOTHROID) 100 MCG tablet TAKE ONE TABLET BY MOUTH ONCE DAILY BEFORE  BREAKFAST  . metoprolol succinate (TOPROL-XL) 50 MG 24 hr tablet Take 1 tablet (50 mg total) by mouth daily.    Review of Systems  Constitutional: Negative.   Cardiovascular: Negative.   Gastrointestinal: Positive for diarrhea.    Social History  Substance Use Topics  . Smoking status: Current Some Day Smoker    Packs/day: 0.25    Types: Cigarettes  . Smokeless tobacco: Never Used     Comment: still smoking every now and then, rarely one  . Alcohol use 0.0 oz/week     Comment: occasionally   Objective:   BP (!) 150/90 (BP Location: Right Arm, Patient Position: Sitting, Cuff Size: Large)   Pulse 80   Temp 98 F (36.7 C) (Oral)   Resp 20   Ht 5\' 6"  (1.676 m)   Wt 183 lb (83 kg)   BMI 29.54 kg/m  Wt Readings from Last 3 Encounters:  10/30/15 183 lb (83 kg)  08/21/15 184 lb (83.5 kg)  07/24/15 184 lb 12.8 oz (83.8 kg)    Physical Exam  Constitutional: She is oriented to person, place, and time. She appears well-developed and well-nourished.  HENT:  Head: Normocephalic.  Nose: Nose normal.  Mouth/Throat: Oropharynx is clear and moist.  Eyes: Conjunctivae and EOM are normal.  Neck: Neck supple.  Cardiovascular: Normal rate and regular rhythm.   Pulmonary/Chest: Effort normal and breath sounds normal.  Abdominal: Soft. Bowel sounds are normal.  Musculoskeletal: Normal range  of motion.  Neurological: She is alert and oriented to person, place, and time.  Psychiatric: Her speech is normal and behavior is normal. Thought content normal. Her mood appears anxious. Cognition and memory are normal. She exhibits a depressed mood. She expresses no suicidal ideation. She expresses no suicidal plans.      Assessment & Plan:     1. Clinical depression Feeling anxious and sad at times. Stopped the Trazodone because of sleepiness and Buspar because of nausea. Still not sleeping well but no further nausea. Some diarrhea/loose stools occasionally. No help from Probiotic or OTC antidiarrhea medications. Recommend she Venlafaxine-XR 75 mg daily only and get follow up labs. May need referral to psychiatrist if unable to gain control of symptoms with medication adjustment/change. Recheck pending lab reports. - CBC with Differential/Platelet - Comprehensive metabolic panel - venlafaxine XR (EFFEXOR-XR) 75 MG 24 hr capsule;  Take 1 capsule (75 mg total) by mouth daily with breakfast.  Dispense: 30 capsule; Refill: 3  2. Essential (primary) hypertension Continues Metoprolol-XL 50 mg qd. Will recheck labs. BP in fair shape today and better than last reading in May 2017. Recheck labs and continue present regimen with salt restriction and limiting caffeine consumption. Follow up pending reports.  - CBC with Differential/Platelet - Comprehensive metabolic panel  3. Hypothyroidism, postop Tolerating Levothyroxine 100 mcg qd. Will check CMP and thyroid function to rule out cause of depressive/anxiety symptoms. - T4 - TSH - Comprehensive metabolic panel  4. History of colon cancer No recurrence of malignancy in the cecum with right hemicolectomy 09-28-13 performed by Dr. Jamal Collin. Finished chemotherapy in 2016.       Vernie Murders, PA  [facility]

## 2015-10-31 LAB — COMPREHENSIVE METABOLIC PANEL
ALT: 17 IU/L (ref 0–32)
AST: 24 IU/L (ref 0–40)
Albumin/Globulin Ratio: 1.3 (ref 1.2–2.2)
Albumin: 4.4 g/dL (ref 3.6–4.8)
Alkaline Phosphatase: 73 IU/L (ref 39–117)
BUN/Creatinine Ratio: 14 (ref 12–28)
BUN: 10 mg/dL (ref 8–27)
Bilirubin Total: 0.3 mg/dL (ref 0.0–1.2)
CO2: 28 mmol/L (ref 18–29)
Calcium: 9.8 mg/dL (ref 8.7–10.3)
Chloride: 99 mmol/L (ref 96–106)
Creatinine, Ser: 0.74 mg/dL (ref 0.57–1.00)
GFR calc Af Amer: 96 mL/min/{1.73_m2} (ref >59)
GFR calc non Af Amer: 83 mL/min/{1.73_m2} (ref >59)
Globulin, Total: 3.3 g/dL (ref 1.5–4.5)
Glucose: 93 mg/dL (ref 65–99)
Potassium: 3.8 mmol/L (ref 3.5–5.2)
Sodium: 142 mmol/L (ref 134–144)
Total Protein: 7.7 g/dL (ref 6.0–8.5)

## 2015-10-31 LAB — CBC WITH DIFFERENTIAL/PLATELET
Basophils Absolute: 0 10*3/uL (ref 0.0–0.2)
Basos: 0 %
EOS (ABSOLUTE): 0 10*3/uL (ref 0.0–0.4)
Eos: 1 %
Hematocrit: 34.9 % (ref 34.0–46.6)
Hemoglobin: 11.4 g/dL (ref 11.1–15.9)
Immature Grans (Abs): 0 10*3/uL (ref 0.0–0.1)
Immature Granulocytes: 0 %
Lymphocytes Absolute: 1.2 10*3/uL (ref 0.7–3.1)
Lymphs: 21 %
MCH: 29.5 pg (ref 26.6–33.0)
MCHC: 32.7 g/dL (ref 31.5–35.7)
MCV: 90 fL (ref 79–97)
Monocytes Absolute: 0.2 10*3/uL (ref 0.1–0.9)
Monocytes: 3 %
Neutrophils Absolute: 4.5 10*3/uL (ref 1.4–7.0)
Neutrophils: 75 %
Platelets: 262 10*3/uL (ref 150–379)
RBC: 3.87 x10E6/uL (ref 3.77–5.28)
RDW: 14.6 % (ref 12.3–15.4)
WBC: 5.9 10*3/uL (ref 3.4–10.8)

## 2015-10-31 LAB — TSH: TSH: 0.078 u[IU]/mL — ABNORMAL LOW (ref 0.450–4.500)

## 2015-10-31 LAB — T4: T4, Total: 11.1 ug/dL (ref 4.5–12.0)

## 2015-11-05 ENCOUNTER — Telehealth: Payer: Self-pay

## 2015-11-05 NOTE — Telephone Encounter (Signed)
-----   Message from [name], [place] sent at 11/05/2015  8:16 AM EDT ----- No sign of low functioning thyroid. Remainder of blood tests normal. Proceed with Venlafaxine and recheck in a month to assess progress.

## 2015-11-05 NOTE — Telephone Encounter (Signed)
Pt advised.   Thanks,   -Gabreal Worton  

## 2015-11-13 ENCOUNTER — Other Ambulatory Visit: Payer: Self-pay | Admitting: Family Medicine

## 2015-11-13 DIAGNOSIS — I10 Essential (primary) hypertension: Secondary | ICD-10-CM

## 2015-11-14 ENCOUNTER — Ambulatory Visit: Payer: Commercial Managed Care - HMO | Admitting: Family Medicine

## 2015-11-15 ENCOUNTER — Encounter: Payer: Self-pay | Admitting: *Deleted

## 2015-11-20 ENCOUNTER — Ambulatory Visit: Payer: Commercial Managed Care - HMO | Admitting: Family Medicine

## 2015-12-03 ENCOUNTER — Encounter: Payer: Self-pay | Admitting: Family Medicine

## 2015-12-03 ENCOUNTER — Ambulatory Visit (INDEPENDENT_AMBULATORY_CARE_PROVIDER_SITE_OTHER): Payer: Commercial Managed Care - HMO | Admitting: Family Medicine

## 2015-12-03 VITALS — BP 156/100 | HR 69 | Temp 97.9°F | Resp 14 | Wt 179.6 lb

## 2015-12-03 DIAGNOSIS — K591 Functional diarrhea: Secondary | ICD-10-CM | POA: Diagnosis not present

## 2015-12-03 DIAGNOSIS — I10 Essential (primary) hypertension: Secondary | ICD-10-CM

## 2015-12-03 DIAGNOSIS — J0120 Acute ethmoidal sinusitis, unspecified: Secondary | ICD-10-CM

## 2015-12-03 DIAGNOSIS — F329 Major depressive disorder, single episode, unspecified: Secondary | ICD-10-CM | POA: Diagnosis not present

## 2015-12-03 DIAGNOSIS — F32A Depression, unspecified: Secondary | ICD-10-CM

## 2015-12-03 MED ORDER — DOXYCYCLINE MONOHYDRATE 100 MG PO TABS: 100.0000 mg | ORAL_TABLET | Freq: Two times a day (BID) | ORAL | 0 refills | Status: DC

## 2015-12-03 MED ORDER — DIPHENOXYLATE-ATROPINE 2.5-0.025 MG PO TABS: 1.0000 | ORAL_TABLET | Freq: Three times a day (TID) | ORAL | 0 refills | Status: DC | PRN

## 2015-12-03 NOTE — Patient Instructions (Signed)
Chronic Diarrhea Diarrhea is frequent loose and watery bowel movements. It can cause you to feel weak and dehydrated. Dehydration can cause you to become tired and thirsty and to have a dry mouth, decreased urination, and dark yellow urine. Diarrhea is a sign of another problem, most often an infection that will not last long. In most cases, diarrhea lasts 2-3 days. Diarrhea that lasts longer than 4 weeks is called long-lasting (chronic) diarrhea. It is important to treat your diarrhea as directed by your health care provider to lessen or prevent future episodes of diarrhea.  CAUSES  There are many causes of chronic diarrhea. The following are some possible causes:   Gastrointestinal infections caused by viruses, bacteria, or parasites.   Food poisoning or food allergies.   Certain medicines, such as antibiotics, chemotherapy, and laxatives.   Artificial sweeteners and fructose.   Digestive disorders, such as celiac disease and inflammatory bowel diseases.   Irritable bowel syndrome.  Some disorders of the pancreas.  Disorders of the thyroid.  Reduced blood flow to the intestines.  Cancer. Sometimes the cause of chronic diarrhea is unknown. RISK FACTORS  Having a severely weakened immune system, such as from HIV or AIDS.   Taking certain types of cancer-fighting drugs (such as with chemotherapy) or other medicines.   Having had a recent organ transplant.   Having a portion of the stomach or small bowel removed.   Traveling to countries where food and water supplies are often contaminated.  SYMPTOMS  In addition to frequent, loose stools, diarrhea may cause:   Cramping.   Abdominal pain.   Nausea.   Fever.  Fatigue.  Urgent need to use the bathroom.  Loss of bowel control. DIAGNOSIS  Your health care provider must take a careful history and perform a physical exam. Tests given are based on your symptoms and history. Tests may include:   Blood or  stool tests. Three or more stool samples may be examined. Stool cultures may be used to test for bacteria or parasites.   X-rays.   A procedure in which a thin tube is inserted into the mouth or rectum (endoscopy). This allows the health care provider to look inside the intestine.  TREATMENT   Treatment is aimed at correcting the cause of the diarrhea when possible.  Diarrhea caused by an infection can often be treated with antibiotic medicines.  Diarrhea not caused by an infection may require you to take long-term medicine or have surgery. Specific treatment should be discussed with your health care provider.  If the cause cannot be determined, treatment aims to relieve symptoms and prevent dehydration. Serious health problems can occur if you do not maintain proper fluid levels. Treatment may include:  Taking an oral rehydration solution (ORS).  Not drinking beverages that contain caffeine (such as tea, coffee, and soft drinks).  Not drinking alcohol.  Maintaining well-balanced nutrition to help you recover faster. HOME CARE INSTRUCTIONS   Drink enough fluids to keep urine clear or pale yellow. Drink 1 cup (8 oz) of fluid for each diarrhea episode. Avoid fluids that contain simple sugars, fruit juices, whole milk products, and sodas. Hydrate with an ORS. You may purchase the ORS or prepare it at home by mixing the following ingredients together:   - tsp (1.7-3  mL) table salt.   tsp (3  mL) baking soda.   tsp (1.7 mL) salt substitute containing potassium chloride.  1 tbsp (20 mL) sugar.  4.2 c (1 L) of water.     Certain foods and beverages may increase the speed at which food moves through the gastrointestinal (GI) tract. These foods and beverages should be avoided. They include:  Caffeinated and alcoholic beverages.  High-fiber foods, such as raw fruits and vegetables, nuts, seeds, and whole grain breads and cereals.  Foods and beverages sweetened with sugar  alcohols, such as xylitol, sorbitol, and mannitol.   Some foods may be well tolerated and may help thicken stool. These include:  Starchy foods, such as rice, toast, pasta, low-sugar cereal, oatmeal, grits, baked potatoes, crackers, and bagels.  Bananas.  Applesauce.  Add probiotic-rich foods to help increase healthy bacteria in the GI tract. These include yogurt and fermented milk products.  Wash your hands well after each diarrhea episode.  Only take over-the-counter or prescription medicines as directed by your health care provider.  Take a warm bath to relieve any burning or pain from frequent diarrhea episodes. SEEK MEDICAL CARE IF:   You are not urinating as often.  Your urine is a dark color.  You become very tired or dizzy.  You have severe pain in the abdomen or rectum.  Your have blood or pus in your stools.  Your stools look black and tarry. SEEK IMMEDIATE MEDICAL CARE IF:   You are unable to keep fluids down.  You have persistent vomiting.  You have blood in your stool.  Your stools are black and tarry.  You do not urinate in 6-8 hours, or there is only a small amount of very dark urine.  You have abdominal pain that increases or localizes.  You have weakness, dizziness, confusion, or lightheadedness.  You have a severe headache.  Your diarrhea gets worse or does not get better.  You have a fever or persistent symptoms for more than 2-3 days.  You have a fever and your symptoms suddenly get worse. MAKE SURE YOU:   Understand these instructions.  Will watch your condition.  Will get help right away if you are not doing well or get worse.   This information is not intended to replace advice given to you by your health care provider. Make sure you discuss any questions you have with your health care provider.   Document Released: 05/31/2003 Document Revised: 03/15/2013 Document Reviewed: 09/02/2012 Elsevier Interactive Patient Education 2016  [employer] Inc.  

## 2015-12-03 NOTE — Progress Notes (Signed)
Patient: Melissa Fitzgerald Female    DOB: 10-Aug-1945   70 y.o.   MRN: [id number] Visit Date: 12/03/2015  Today's Provider: Vernie Murders, PA   Chief Complaint  Patient presents with  . Depression  . Follow-up  . Sinusitis   Subjective:    Sinusitis  This is a new problem. The current episode started 1 to 4 weeks ago. The problem is unchanged. Associated symptoms include ear pain, headaches and sinus pressure. Treatments tried: excedrin. The treatment provided mild relief.    Depression, Follow-up  She was last seen for this 1 months ago. Changes made at last visit include stopped Trazodone and Buspar. Added Venlafaxine 75 mg.                        She reports excellent compliance with treatment. She is not having side effects  She reports good tolerance of treatment. Current symptoms include: depressed mood She feels she is starting to feel better since last visit.  Past Medical History:  Diagnosis Date  . Anemia   . Colon polyp   . Hyperlipidemia   . Hypertension   . Irritable bowel disease   . Kidney stone   . Thyroid disease    Past Surgical History:  Procedure Laterality Date  . ABDOMINAL HYSTERECTOMY    . BREAST ENHANCEMENT SURGERY    . CHOLECYSTECTOMY  2000  . COLECTOMY  09/2013  . COLONOSCOPY  09-13-13   Dr [name]  . COLONOSCOPY WITH PROPOFOL N/A 10/18/2014   Procedure: COLONOSCOPY WITH PROPOFOL;  Surgeon: [name], MD;  Location: [facility];  Service: Endoscopy;  Laterality: N/A;  . ESOPHAGOGASTRODUODENOSCOPY (EGD) WITH PROPOFOL N/A 01/03/2015   Procedure: ESOPHAGOGASTRODUODENOSCOPY (EGD) WITH PROPOFOL;  Surgeon: [name], MD;  Location: [facility];  Service: Endoscopy;  Laterality: N/A;  . FOOT SURGERY    . KIDNEY STONE SURGERY    . NASAL SINUS SURGERY    . PORTACATH PLACEMENT  10/24/13  . THYROID SURGERY    . TONSILLECTOMY     Family History  Problem Relation Age of Onset  . Heart disease Mother   . Diabetes  Mother   . Arthritis Sister   . Hyperlipidemia Sister   . COPD Brother   . Diabetes Sister   . Colon cancer      first cousin  . Breast cancer Maternal Aunt   . Breast cancer Paternal Aunt    Allergies  Allergen Reactions  . Codeine Nausea And Vomiting  . Oxycodone Nausea And Vomiting  . Penicillins Nausea And Vomiting  . Tramadol Nausea And Vomiting   Previous Medications   ESOMEPRAZOLE (NEXIUM) 40 MG CAPSULE    Take 1 capsule (40 mg total) by mouth daily at 12 noon.   FERROUS FUMARATE 324 (106 FE) MG TABS    Take 1 tablet by mouth daily.   LEVOTHYROXINE (SYNTHROID, LEVOTHROID) 100 MCG TABLET    TAKE ONE TABLET BY MOUTH ONCE DAILY BEFORE  BREAKFAST   MECLIZINE (ANTIVERT) 25 MG TABLET    Take 1 tablet (25 mg total) by mouth 4 (four) times daily as needed for dizziness. Nausea or anxiety.   METOPROLOL SUCCINATE (TOPROL-XL) 50 MG 24 HR TABLET    TAKE ONE TABLET BY MOUTH ONCE DAILY   VENLAFAXINE XR (EFFEXOR-XR) 75 MG 24 HR CAPSULE    Take 1 capsule (75 mg total) by mouth daily with breakfast.    Review of Systems  Constitutional: Negative.   HENT:  Positive for ear pain and sinus pressure.   Respiratory: Negative.   Cardiovascular: Negative.   Neurological: Positive for headaches.  Psychiatric/Behavioral: Positive for dysphoric mood.    Social History  Substance Use Topics  . Smoking status: Current Some Day Smoker    Packs/day: 0.25    Types: Cigarettes  . Smokeless tobacco: Never Used     Comment: still smoking every now and then, rarely one  . Alcohol use 0.0 oz/week     Comment: occasionally   Objective:   BP (!) 156/100 (BP Location: Right Arm, Patient Position: Sitting, Cuff Size: Normal)   Pulse 69   Temp 97.9 F (36.6 C) (Oral)   Resp 14   Wt 179 lb 9.6 oz (81.5 kg)   BMI 28.99 kg/m  Wt Readings from Last 3 Encounters:  12/03/15 179 lb 9.6 oz (81.5 kg)  10/30/15 183 lb (83 kg)  08/21/15 184 lb (83.5 kg)   BP Readings from Last 3 Encounters:  12/03/15  (!) 156/100  10/30/15 (!) 150/90  08/21/15 (!) 163/98   Physical Exam  Constitutional: She is oriented to person, place, and time. She appears well-developed and well-nourished. No distress.  HENT:  Head: Normocephalic and atraumatic.  Right Ear: Hearing and external ear normal.  Left Ear: Hearing and external ear normal.  Nose: Nose normal.  Mouth/Throat: Oropharynx is clear and moist.  Tender frontal and ethmoid sinuses. Good transillumination through maxillary sinuses.  Eyes: Conjunctivae, EOM and lids are normal. Right eye exhibits no discharge. Left eye exhibits no discharge. No scleral icterus.  Neck: Neck supple.  Cardiovascular: Normal rate and regular rhythm.   Pulmonary/Chest: Effort normal and breath sounds normal. No respiratory distress.  Abdominal: Soft.  Musculoskeletal: Normal range of motion.  Lymphadenopathy:    She has no cervical adenopathy.  Neurological: She is alert and oriented to person, place, and time.  Skin: Skin is intact. No lesion and no rash noted.  Psychiatric: She has a normal mood and affect. Her speech is normal and behavior is normal. Thought content normal.      Assessment & Plan:     1. Subacute ethmoidal sinusitis Onset over th past week with tenderness in the frontal and ethmoid region. Having rhinorrhea with headache and right earache. No fever. Will treat with antibiotic and may add Claritin. Increase fluids and recheck prn. - doxycycline (ADOXA) 100 MG tablet; Take 1 tablet (100 mg total) by mouth 2 (two) times daily.  Dispense: 20 tablet; Refill: 0  2. Essential (primary) hypertension Patient feels elevation today due to sinusitis. Continues Metoprol daily. Recheck BP in a month.  3. Functional diarrhea Intermittent multiple loose stools since partial colectomy for cancer. Minimal help from probiotics and Pepto-Bismol. No help from Imodium. Will give short course of Lomotil. If no better, may need GI referral. - diphenoxylate-atropine  (LOMOTIL) 2.5-0.025 MG tablet; Take 1 tablet by mouth 3 (three) times daily as needed for diarrhea or loose stools.  Dispense: 21 tablet; Refill: 0  4. Clinical depression Improved with use of Venlafaxine-XR 75 mg qd. Continue present dose. If any recurrence, may need increase in dosage.

## 2015-12-10 ENCOUNTER — Telehealth: Payer: Self-pay | Admitting: Family Medicine

## 2015-12-10 MED ORDER — ZOLPIDEM TARTRATE 5 MG PO TABS: 5.0000 mg | ORAL_TABLET | Freq: Every evening | ORAL | 1 refills | Status: DC | PRN

## 2015-12-10 NOTE — Telephone Encounter (Signed)
Pt daughter, Lattie Haw called states her mom is not sleeping well and having anxiety.  Pt has not slept well in over a month.  Pt has rec'd a Rx to help with this but it does not seem to be working.  Pt daughter is requesting a Rx for Zolpidem if possible.  [place].  CB#[phone]/MW

## 2015-12-10 NOTE — Telephone Encounter (Signed)
Effexor is helping with mood elevation - "feeling better". Still has difficulty getting to sleep. Go to bed around 2-3 a.m. Don't go to sleep much and get up at 6 a.m. Want to try Ambien 5 mg hs for a month to see if she can get back on a normal sleep pattern. Schedule appointment in 4-5 weeks.

## 2015-12-11 ENCOUNTER — Other Ambulatory Visit: Payer: Self-pay | Admitting: Family Medicine

## 2015-12-12 ENCOUNTER — Encounter: Payer: Self-pay | Admitting: *Deleted

## 2015-12-12 NOTE — Telephone Encounter (Signed)
LOV 12/03/2015. Renaldo Fiddler, CMA

## 2015-12-19 ENCOUNTER — Ambulatory Visit: Payer: Commercial Managed Care - HMO | Admitting: General Surgery

## 2015-12-27 ENCOUNTER — Ambulatory Visit: Payer: Commercial Managed Care - HMO | Admitting: General Surgery

## 2016-01-11 ENCOUNTER — Telehealth: Payer: Self-pay | Admitting: Family Medicine

## 2016-01-11 NOTE — Telephone Encounter (Signed)
Called Pt to schedule AWV with [facility] for Nov - knb °

## 2016-01-15 ENCOUNTER — Other Ambulatory Visit: Payer: Self-pay | Admitting: *Deleted

## 2016-01-15 DIAGNOSIS — Z85038 Personal history of other malignant neoplasm of large intestine: Secondary | ICD-10-CM

## 2016-01-17 ENCOUNTER — Inpatient Hospital Stay: Payer: Commercial Managed Care - HMO

## 2016-01-17 ENCOUNTER — Inpatient Hospital Stay: Payer: Commercial Managed Care - HMO | Admitting: Hematology and Oncology

## 2016-01-31 ENCOUNTER — Inpatient Hospital Stay: Payer: Commercial Managed Care - HMO | Admitting: Hematology and Oncology

## 2016-01-31 ENCOUNTER — Inpatient Hospital Stay: Payer: Commercial Managed Care - HMO

## 2016-01-31 NOTE — Progress Notes (Unsigned)
[facility] day:  01/31/2016  Chief Complaint: Melissa Fitzgerald is a 70 y.o. female stage III colon cancer who is seen for reassessment.  HPI: ***  70 year old lady who does not have any major medical problem was admitted in the hospital for anemia requiring blood transfusion.  Patient underwent colonoscopy.  In infiltrative  mass was found in the ascending colon .   Abdomen and pelvic CT scan with contrast on 09/26/2013 revealed no evidence of metastatic disease.  Colonoscopy on 09/13/2013 by Dr. Loistine Simas revealed a 4 cm malignant tumor in the proximal ascending colon.  There were two 2-3 mm polyps in the distal sigmoid colon.  She underwent right hemicolectomy by Dr. Jamal Collin on 09/29/2013.  Pathology revealed a 2.7 cm moderately differentiated invasive adenocarcinoma which extended through the muscularis propria.  There was lymphvascular invasion.  There was no perineural invasion.  One of 14 lymph nodes were positive.  Pathologic stage was T3N1a.  1. 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 Jamal Collin Colonoscopy was done in June of 23, 2015 by Dr. Gustavo Lah. 2. Started adjuvant chemotherapy with FOLFOX on August 12th 2015 oxaliplatin was put on hold after 6 cycles because of neuropathy 3.send finished 12 cycles of chemotherapy with FOLFOX.  Last few cycle oxaliplatin was held because of neuropath  EGD on 01/03/2015 by Dr. Jamal Collin revealed gastritis.  Biopsies revealed healing erosive gastritis and mild chronic gastritis.  Biopsies were negative for H pylori, dysplaisa and malignancy.  Started on Nexium.  Colonoscopy on 10/18/2014 by Dr Jamal Collin revealed a normal colon.   CEA was 14 on 09/22/2013, 2.9 on 10/17/2013, 1.9 on 04/19/2014, 1.9 on 10/16/2014, 1.9 on 01/16/2015 and 1.6 on 07/18/2015.  Ferritin was 27 on 04/17/2015  The patient was last seen by  Dr. Oliva Bustard on 07/18/2015.  At that time, she had anemia felt secondary to iron deficiency.  She was started on orla iron.  She was scheduled for mammogram.She was referred to the low dose chest CT screening program secondary to her smoking history.  Labs on 07/18/2015 included a hematocrit 33.4, hemoglobin 11.2, MCV 91.2, platelets 254,000, white count 4800 with an ANC of 3500.  Comprehensive metabolic panel normal.  CEA was 1.6.  She was seen in the emergency room on 08/21/2015 secondary to diarrhea   Past Medical History:  Diagnosis Date  . Anemia   . Colon polyp   . Hyperlipidemia   . Hypertension   . Irritable bowel disease   . Kidney stone   . Thyroid disease     Past Surgical History:  Procedure Laterality Date  . ABDOMINAL HYSTERECTOMY    . BREAST ENHANCEMENT SURGERY    . CHOLECYSTECTOMY  2000  . COLECTOMY  09/2013  . COLONOSCOPY  09-13-13   Dr [name]  . COLONOSCOPY WITH PROPOFOL N/A 10/18/2014   Procedure: COLONOSCOPY WITH PROPOFOL;  Surgeon: [name], MD;  Location: [facility];  Service: Endoscopy;  Laterality: N/A;  . ESOPHAGOGASTRODUODENOSCOPY (EGD) WITH PROPOFOL N/A 01/03/2015   Procedure: ESOPHAGOGASTRODUODENOSCOPY (EGD) WITH PROPOFOL;  Surgeon: [name], MD;  Location: [facility];  Service: Endoscopy;  Laterality: N/A;  . FOOT SURGERY    . KIDNEY STONE SURGERY    . NASAL SINUS SURGERY    . PORTACATH PLACEMENT  10/24/13  . THYROID SURGERY    . TONSILLECTOMY  Family History  Problem Relation Age of Onset  . Heart disease Mother   . Diabetes Mother   . Arthritis Sister   . Hyperlipidemia Sister   . COPD Brother   . Diabetes Sister   . Colon cancer      first cousin  . Breast cancer Maternal Aunt   . Breast cancer Paternal Aunt     Social History:  reports that she has been smoking Cigarettes.  She has been smoking about 0.25 packs per day. She has never used smokeless tobacco. She reports that she drinks alcohol. She  reports that she does not use drugs.  The patient is accompanied by *** alone today.  Allergies:  Allergies  Allergen Reactions  . Codeine Nausea And Vomiting  . Oxycodone Nausea And Vomiting  . Penicillins Nausea And Vomiting  . Tramadol Nausea And Vomiting    Current Medications: Current Outpatient Prescriptions  Medication Sig Dispense Refill  . diphenoxylate-atropine (LOMOTIL) 2.5-0.025 MG tablet Take 1 tablet by mouth 3 (three) times daily as needed for diarrhea or loose stools. 21 tablet 0  . doxycycline (ADOXA) 100 MG tablet Take 1 tablet (100 mg total) by mouth 2 (two) times daily. 20 tablet 0  . esomeprazole (NEXIUM) 40 MG capsule Take 1 capsule (40 mg total) by mouth daily at 12 noon. 30 capsule 3  . Ferrous Fumarate 324 (106 FE) MG TABS Take 1 tablet by mouth daily. 30 tablet 3  . levothyroxine (SYNTHROID, LEVOTHROID) 100 MCG tablet TAKE ONE TABLET BY MOUTH ONCE DAILY BEFORE BREAKFAST 30 tablet 6  . meclizine (ANTIVERT) 25 MG tablet Take 1 tablet (25 mg total) by mouth 4 (four) times daily as needed for dizziness. Nausea or anxiety. 30 tablet 3  . metoprolol succinate (TOPROL-XL) 50 MG 24 hr tablet TAKE ONE TABLET BY MOUTH ONCE DAILY 30 tablet 3  . venlafaxine XR (EFFEXOR-XR) 75 MG 24 hr capsule Take 1 capsule (75 mg total) by mouth daily with breakfast. 30 capsule 3  . zolpidem (AMBIEN) 5 MG tablet Take 1 tablet (5 mg total) by mouth at bedtime as needed for sleep. 15 tablet 1   No current facility-administered medications for this visit.     Review of Systems:  GENERAL:  Feels good.  Active.  No fevers, sweats or weight loss. PERFORMANCE STATUS (ECOG):  *** HEENT:  No visual changes, runny nose, sore throat, mouth sores or tenderness. Lungs: No shortness of breath or cough.  No hemoptysis. Cardiac:  No chest pain, palpitations, orthopnea, or PND. GI:  No nausea, vomiting, diarrhea, constipation, melena or hematochezia. GU:  No urgency, frequency, dysuria, or  hematuria. Musculoskeletal:  No back pain.  No joint pain.  No muscle tenderness. Extremities:  No pain or swelling. Skin:  No rashes or skin changes. Neuro:  No headache, numbness or weakness, balance or coordination issues. Endocrine:  No diabetes, thyroid issues, hot flashes or night sweats. Psych:  No mood changes, depression or anxiety. Pain:  No focal pain. Review of systems:  All other systems reviewed and found to be negative.   Physical Exam: There were no vitals taken for this visit. GENERAL:  Well developed, well nourished, sitting comfortably in the exam room in no acute distress. MENTAL STATUS:  Alert and oriented to person, place and time. HEAD:  *** hair.  Normocephalic, atraumatic, face symmetric, no Cushingoid features. EYES:  *** eyes.  Pupils equal round and reactive to light and accomodation.  No conjunctivitis or scleral icterus. ENT:  Oropharynx  clear without lesion.  Tongue normal. Mucous membranes moist.  RESPIRATORY:  Clear to auscultation without rales, wheezes or rhonchi. CARDIOVASCULAR:  Regular rate and rhythm without murmur, rub or gallop. ABDOMEN:  Soft, non-tender, with active bowel sounds, and no hepatosplenomegaly.  No masses. SKIN:  No rashes, ulcers or lesions. EXTREMITIES: No edema, no skin discoloration or tenderness.  No palpable cords. LYMPH NODES: No palpable cervical, supraclavicular, axillary or inguinal adenopathy  NEUROLOGICAL: Unremarkable. PSYCH:  Appropriate.  No visits with results within 3 Day(s) from this visit.  Latest known visit with results is:  Office Visit on 10/30/2015  Component Date Value Ref Range Status  . T4, Total 10/31/2015 11.1  4.5 - 12.0 ug/dL Final  . TSH 10/31/2015 0.078* 0.450 - 4.500 uIU/mL Final  . WBC 10/31/2015 5.9  3.4 - 10.8 x10E3/uL Final  . RBC 10/31/2015 3.87  3.77 - 5.28 x10E6/uL Final  . Hemoglobin 10/31/2015 11.4  11.1 - 15.9 g/dL Final  . Hematocrit 10/31/2015 34.9  34.0 - 46.6 % Final  . MCV  10/31/2015 90  79 - 97 fL Final  . MCH 10/31/2015 29.5  26.6 - 33.0 pg Final  . MCHC 10/31/2015 32.7  31.5 - 35.7 g/dL Final  . RDW 10/31/2015 14.6  12.3 - 15.4 % Final  . Platelets 10/31/2015 262  150 - 379 x10E3/uL Final  . Neutrophils 10/31/2015 75  % Final  . Lymphs 10/31/2015 21  % Final  . Monocytes 10/31/2015 3  % Final  . Eos 10/31/2015 1  % Final  . Basos 10/31/2015 0  % Final  . Neutrophils Absolute 10/31/2015 4.5  1.4 - 7.0 x10E3/uL Final  . Lymphocytes Absolute 10/31/2015 1.2  0.7 - 3.1 x10E3/uL Final  . Monocytes Absolute 10/31/2015 0.2  0.1 - 0.9 x10E3/uL Final  . EOS (ABSOLUTE) 10/31/2015 0.0  0.0 - 0.4 x10E3/uL Final  . Basophils Absolute 10/31/2015 0.0  0.0 - 0.2 x10E3/uL Final  . Immature Granulocytes 10/31/2015 0  % Final  . Immature Grans (Abs) 10/31/2015 0.0  0.0 - 0.1 x10E3/uL Final  . Glucose 10/31/2015 93  65 - 99 mg/dL Final  . BUN 10/31/2015 10  8 - 27 mg/dL Final  . Creatinine, Ser 10/31/2015 0.74  0.57 - 1.00 mg/dL Final  . GFR calc non Af Amer 10/31/2015 83  >59 mL/min/1.73 Final  . GFR calc Af Amer 10/31/2015 96  >59 mL/min/1.73 Final  . BUN/Creatinine Ratio 10/31/2015 14  12 - 28 Final  . Sodium 10/31/2015 142  134 - 144 mmol/L Final  . Potassium 10/31/2015 3.8  3.5 - 5.2 mmol/L Final  . Chloride 10/31/2015 99  96 - 106 mmol/L Final  . CO2 10/31/2015 28  18 - 29 mmol/L Final  . Calcium 10/31/2015 9.8  8.7 - 10.3 mg/dL Final  . Total Protein 10/31/2015 7.7  6.0 - 8.5 g/dL Final  . Albumin 10/31/2015 4.4  3.6 - 4.8 g/dL Final  . Globulin, Total 10/31/2015 3.3  1.5 - 4.5 g/dL Final  . Albumin/Globulin Ratio 10/31/2015 1.3  1.2 - 2.2 Final  . Bilirubin Total 10/31/2015 0.3  0.0 - 1.2 mg/dL Final  . Alkaline Phosphatase 10/31/2015 73  39 - 117 IU/L Final  . AST 10/31/2015 24  0 - 40 IU/L Final  . ALT 10/31/2015 17  0 - 32 IU/L Final    Assessment:  Melissa Fitzgerald is a 70 y.o. female ***  Plan: 1. *** 2. *** 3. *** 4. *** 5. ***  Melissa C  Mike Gip, MD  01/31/2016, 5:20 AM

## 2016-02-10 NOTE — Progress Notes (Signed)
[facility] day:  02/11/2016  Chief Complaint: Melissa Fitzgerald is a 69 y.o. female with stage III colon cancer who is seen for reassessment.  HPI:  The patient was diagnosed with colon cancer in 2015 after presenting with symptomatic anemia requiring transfusion.  Colonoscopy by Dr. Gustavo Lah on 09/13/2013 revealed a malignant tumor in the proximal ascending colon.  Cecal mass biopsy revealed moderately differentiated invasive adenocarcinoma.  She underwent right hemicolectomy on 09/29/2013 by Dr. Jamal Collin.  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  There was lymph-vascular invasion.  There was no perineural invasion.  Margins were negative.  One of 14 lymph nodes were negative.  Pathologic stage was T3N1aM0.  She received 12 cycles of adjuvant chemotherapy.  She received 6 cycles of FOLFOX.  FOLFOX was switched to 5FU/LV secondary to a neuropathy.    Abdomen and pelvic CT scan without contrast on 02/07/2014 revealed a distal right ureteral calculus causing high grade obstruction of the right ureter and kidney.  She underwent colonoscopy on 10/17/2014 by Dr. Jamal Collin.  Colon was normal.  EGD on 01/03/2015 revealed diffuse moderate inflammation in the stomach characterized by erosions and erythema.  Pathology revealed healing erosive gastritis.  There was no H pylori, dysplasia or malignancy.  CEA has been followed:  14.0 on 09/22/2013, 2.9 on 10/17/2013, 1.9 on 04/19/2014, 1.9 on 10/16/2014, 1.9 on 01/16/2015, and 1.6 on 07/18/2015.  Her port-a-cath was removed in the spring of 2017.  She was last seen in medical oncology clinic on 07/18/2015 by Dr. Paulino Door.  At that time, she complained of burning at the site of her port.  She was smoking.  She denied any abdominal symptoms.  Labs included a hematocrit 33.4, hemoglobin 11.2 and MCV 91.2.  She states that she had a bad case of  diarrhea all summer.  Diarrhea is now improving.  She denies any melena or hematochezia.    Regarding her diet, she states that she has a good appetite.  She eats "ok".  She does not eat a lot of meat.  She eats green leafy vegetables and soups.  Symptomatically, she notes that her back hurts once in awhile.  She has arthritis in her hands.  She takes BC 5 times a week.   Past Medical History:  Diagnosis Date  . Anemia   . Colon polyp   . Hyperlipidemia   . Hypertension   . Irritable bowel disease   . Kidney stone   . Thyroid disease     Past Surgical History:  Procedure Laterality Date  . ABDOMINAL HYSTERECTOMY    . BREAST ENHANCEMENT SURGERY    . CHOLECYSTECTOMY  2000  . COLECTOMY  09/2013  . COLONOSCOPY  09-13-13   Dr [name]  . COLONOSCOPY WITH PROPOFOL N/A 10/18/2014   Procedure: COLONOSCOPY WITH PROPOFOL;  Surgeon: [name], MD;  Location: [facility] ENDOSCOPY;  Service: Endoscopy;  Laterality: N/A;  . ESOPHAGOGASTRODUODENOSCOPY (EGD) WITH PROPOFOL N/A 01/03/2015   Procedure: ESOPHAGOGASTRODUODENOSCOPY (EGD) WITH PROPOFOL;  Surgeon: [name], MD;  Location: [facility] ENDOSCOPY;  Service: Endoscopy;  Laterality: N/A;  . FOOT SURGERY    . KIDNEY STONE SURGERY    . NASAL SINUS SURGERY    . PORTACATH PLACEMENT  10/24/13  . THYROID SURGERY    . TONSILLECTOMY      Family History  Problem Relation Age of Onset  . Heart disease Mother   . Diabetes Mother   .  Arthritis Sister   . Hyperlipidemia Sister   . COPD Brother   . Diabetes Sister   . Colon cancer      first cousin  . Breast cancer Maternal Aunt   . Breast cancer Paternal Aunt     Social History:  reports that she has been smoking Cigarettes.  She has been smoking about 0.25 packs per day. She has never used smokeless tobacco. She reports that she drinks alcohol. She reports that she does not use drugs.  She lives in [place].  The patient is alone today.  Allergies:  Allergies  Allergen  Reactions  . Codeine Nausea And Vomiting  . Oxycodone Nausea And Vomiting  . Penicillins Nausea And Vomiting  . Tramadol Nausea And Vomiting    Current Medications: Current Outpatient Prescriptions  Medication Sig Dispense Refill  . diphenoxylate-atropine (LOMOTIL) 2.5-0.025 MG tablet Take 1 tablet by mouth 3 (three) times daily as needed for diarrhea or loose stools. 21 tablet 0  . esomeprazole (NEXIUM) 40 MG capsule Take 1 capsule (40 mg total) by mouth daily at 12 noon. 30 capsule 3  . levothyroxine (SYNTHROID, LEVOTHROID) 100 MCG tablet TAKE ONE TABLET BY MOUTH ONCE DAILY BEFORE BREAKFAST 30 tablet 6  . metoprolol succinate (TOPROL-XL) 50 MG 24 hr tablet TAKE ONE TABLET BY MOUTH ONCE DAILY 30 tablet 3  . venlafaxine XR (EFFEXOR-XR) 75 MG 24 hr capsule Take 1 capsule (75 mg total) by mouth daily with breakfast. 30 capsule 3  . zolpidem (AMBIEN) 5 MG tablet Take 1 tablet (5 mg total) by mouth at bedtime as needed for sleep. 15 tablet 1  . doxycycline (ADOXA) 100 MG tablet Take 1 tablet (100 mg total) by mouth 2 (two) times daily. (Patient not taking: Reported on 02/11/2016) 20 tablet 0  . Ferrous Fumarate 324 (106 FE) MG TABS Take 1 tablet by mouth daily. (Patient not taking: Reported on 02/11/2016) 30 tablet 3  . meclizine (ANTIVERT) 25 MG tablet Take 1 tablet (25 mg total) by mouth 4 (four) times daily as needed for dizziness. Nausea or anxiety. (Patient not taking: Reported on 02/11/2016) 30 tablet 3   No current facility-administered medications for this visit.     Review of Systems:  GENERAL:  Feels"ok".  No fevers, sweats or weight loss. PERFORMANCE STATUS (ECOG):  1 HEENT:  Rare nose bleed.  No visual changes, runny nose, sore throat, mouth sores or tenderness. Lungs: No shortness of breath or cough.  No hemoptysis. Cardiac:  No chest pain, palpitations, orthopnea, or PND. GI:  Good appetite.  Diarrhea, improving (see HPI).  No nausea, vomiting, constipation, melena or  hematochezia. GU:  No urgency, frequency, dysuria, or hematuria. Musculoskeletal:  Intermittent back pain.  Arthritis in hands.  No muscle tenderness. Extremities:  No pain or swelling. Skin:  No rashes or skin changes. Neuro:  No headache, numbness or weakness, balance or coordination issues. Endocrine:  No diabetes, thyroid issues, hot flashes or night sweats. Psych:  No mood changes, depression or anxiety. Pain:  No focal pain. Review of systems:  All other systems reviewed and found to be negative.  Physical Exam: Blood pressure (!) 159/94, pulse 76, temperature (!) 96.7 F (35.9 C), temperature source Tympanic, weight 176 lb 9.4 oz (80.1 kg). GENERAL:  Well developed, well nourished, woman sitting comfortably in the exam room in no acute distress. MENTAL STATUS:  Alert and oriented to person, place and time. HEAD:  Wearing a denim cap.  Short gray hair.  Normocephalic, atraumatic, face  symmetric, no Cushingoid features. EYES:  Hazel eyes.  Pupils equal round and reactive to light and accomodation.  No conjunctivitis or scleral icterus. ENT:  Oropharynx clear without lesion.  Tongue normal. Mucous membranes moist.  RESPIRATORY:  Clear to auscultation without rales, wheezes or rhonchi. CARDIOVASCULAR:  Regular rate and rhythm without murmur, rub or gallop. ABDOMEN:  Soft, non-tender, with active bowel sounds, and no hepatosplenomegaly.  No masses. SKIN:  No rashes, ulcers or lesions. EXTREMITIES: No edema, no skin discoloration or tenderness.  No palpable cords. LYMPH NODES: No palpable cervical, supraclavicular, axillary or inguinal adenopathy  NEUROLOGICAL: Unremarkable. PSYCH:  Appropriate.   Appointment on 02/11/2016  Component Date Value Ref Range Status  . WBC 02/11/2016 3.9  3.6 - 11.0 K/uL Final  . RBC 02/11/2016 3.22* 3.80 - 5.20 MIL/uL Final  . Hemoglobin 02/11/2016 8.9* 12.0 - 16.0 g/dL Final  . HCT 02/11/2016 28.0* 35.0 - 47.0 % Final  . MCV 02/11/2016 87.0  80.0 -  100.0 fL Final  . MCH 02/11/2016 27.8  26.0 - 34.0 pg Final  . MCHC 02/11/2016 31.9* 32.0 - 36.0 g/dL Final  . RDW 02/11/2016 15.4* 11.5 - 14.5 % Final  . Platelets 02/11/2016 265  150 - 440 K/uL Final  . Neutrophils Relative % 02/11/2016 68  % Final  . Neutro Abs 02/11/2016 2.6  1.4 - 6.5 K/uL Final  . Lymphocytes Relative 02/11/2016 21  % Final  . Lymphs Abs 02/11/2016 0.8* 1.0 - 3.6 K/uL Final  . Monocytes Relative 02/11/2016 9  % Final  . Monocytes Absolute 02/11/2016 0.3  0.2 - 0.9 K/uL Final  . Eosinophils Relative 02/11/2016 1  % Final  . Eosinophils Absolute 02/11/2016 0.1  0 - 0.7 K/uL Final  . Basophils Relative 02/11/2016 1  % Final  . Basophils Absolute 02/11/2016 0.0  0 - 0.1 K/uL Final  . Sodium 02/11/2016 143  135 - 145 mmol/L Final  . Potassium 02/11/2016 3.4* 3.5 - 5.1 mmol/L Final  . Chloride 02/11/2016 105  101 - 111 mmol/L Final  . CO2 02/11/2016 30  22 - 32 mmol/L Final  . Glucose, Bld 02/11/2016 92  65 - 99 mg/dL Final  . BUN 02/11/2016 17  6 - 20 mg/dL Final  . Creatinine, Ser 02/11/2016 0.67  0.44 - 1.00 mg/dL Final  . Calcium 02/11/2016 8.9  8.9 - 10.3 mg/dL Final  . Total Protein 02/11/2016 7.6  6.5 - 8.1 g/dL Final  . Albumin 02/11/2016 3.9  3.5 - 5.0 g/dL Final  . AST 02/11/2016 24  15 - 41 U/L Final  . ALT 02/11/2016 14  14 - 54 U/L Final  . Alkaline Phosphatase 02/11/2016 50  38 - 126 U/L Final  . Total Bilirubin 02/11/2016 0.3  0.3 - 1.2 mg/dL Final  . GFR calc non Af Amer 02/11/2016 >60  >60 mL/min Final  . GFR calc Af Amer 02/11/2016 >60  >60 mL/min Final   Comment: (NOTE) The eGFR has been calculated using the CKD EPI equation. This calculation has not been validated in all clinical situations. eGFR's persistently <60 mL/min signify possible Chronic Kidney Disease.   . Anion gap 02/11/2016 8  5 - 15 Final    Assessment:  Melissa Fitzgerald is a 70 y.o. female with stage III colon cancer s/p right hemicolectomy on 09/29/2013.  Pathology revealed a  2.7 cm moderately differentiated adenocarcinoma which extended through the muscularis propia.  There was lymph-vascular invasion.  There was no perineural invasion.  Margins were  negative.  One of 14 lymph nodes were negative.  Pathologic stage was T3N1aM0.  She received 12 cycles of adjuvant chemotherapy.  She received 6 cycles of FOLFOX.  FOLFOX was switched to 5FU/LV secondary to a neuropathy.    Abdomen and pelvic CT scan without contrast on 02/07/2014 revealed a distal right ureteral calculus causing high grade obstruction of the right ureter and kidney.  Colonoscopy on 10/17/2014 was normal.  EGD on 01/03/2015 revealed diffuse moderate inflammation in the stomach.  Pathology revealed healing erosive gastritis.  There was no H pylori, dysplasia or malignancy.  CEA has been followed:  14.0 on 09/22/2013, 2.9 on 10/17/2013, 1.9 on 04/19/2014, 1.9 on 10/16/2014, 1.9 on 01/16/2015, 1.6 on 07/18/2015, and 1.8 on 02/11/2016.  She was diagnosed with a normocytic anemia on 07/18/2015.  Diet is modest.  She denies any melena, hematochezia, or hematuria  Symptomatically, she has arthritis.  She has been taking BC powders 5 times/week.  Plan: 1.  Review entire medical history, diagnosis and management of colon cancer.  Discuss yearly CT scans x 5 years post diagnosis. 2.  Discuss anemia and work-up.  Discuss concern for excess BC power intake and possible gastritis. 3.  Labs today:  CBC with diff, CMP, CEA, ferritin, iron studies, B12, folate. 4.  Schedule chest, abdomen, and pelvic CT scan. 4.  RTC after CT scans.   Lequita Asal, MD  02/11/2016, 11:37 AM

## 2016-02-11 ENCOUNTER — Telehealth: Payer: Self-pay | Admitting: *Deleted

## 2016-02-11 ENCOUNTER — Inpatient Hospital Stay (HOSPITAL_BASED_OUTPATIENT_CLINIC_OR_DEPARTMENT_OTHER): Payer: Commercial Managed Care - HMO | Admitting: Hematology and Oncology

## 2016-02-11 ENCOUNTER — Inpatient Hospital Stay: Payer: Commercial Managed Care - HMO | Attending: Hematology and Oncology

## 2016-02-11 ENCOUNTER — Encounter: Payer: Self-pay | Admitting: Hematology and Oncology

## 2016-02-11 VITALS — BP 159/94 | HR 76 | Temp 96.7°F | Wt 176.6 lb

## 2016-02-11 DIAGNOSIS — K589 Irritable bowel syndrome without diarrhea: Secondary | ICD-10-CM | POA: Diagnosis not present

## 2016-02-11 DIAGNOSIS — C180 Malignant neoplasm of cecum: Secondary | ICD-10-CM

## 2016-02-11 DIAGNOSIS — F17210 Nicotine dependence, cigarettes, uncomplicated: Secondary | ICD-10-CM | POA: Diagnosis not present

## 2016-02-11 DIAGNOSIS — D649 Anemia, unspecified: Secondary | ICD-10-CM | POA: Diagnosis not present

## 2016-02-11 DIAGNOSIS — E039 Hypothyroidism, unspecified: Secondary | ICD-10-CM

## 2016-02-11 DIAGNOSIS — Z9221 Personal history of antineoplastic chemotherapy: Secondary | ICD-10-CM

## 2016-02-11 DIAGNOSIS — Z79899 Other long term (current) drug therapy: Secondary | ICD-10-CM | POA: Diagnosis not present

## 2016-02-11 DIAGNOSIS — I10 Essential (primary) hypertension: Secondary | ICD-10-CM | POA: Insufficient documentation

## 2016-02-11 DIAGNOSIS — Z85038 Personal history of other malignant neoplasm of large intestine: Secondary | ICD-10-CM

## 2016-02-11 DIAGNOSIS — E785 Hyperlipidemia, unspecified: Secondary | ICD-10-CM | POA: Insufficient documentation

## 2016-02-11 LAB — COMPREHENSIVE METABOLIC PANEL
ALT: 14 U/L (ref 14–54)
AST: 24 U/L (ref 15–41)
Albumin: 3.9 g/dL (ref 3.5–5.0)
Alkaline Phosphatase: 50 U/L (ref 38–126)
Anion gap: 8 (ref 5–15)
BUN: 17 mg/dL (ref 6–20)
CO2: 30 mmol/L (ref 22–32)
Calcium: 8.9 mg/dL (ref 8.9–10.3)
Chloride: 105 mmol/L (ref 101–111)
Creatinine, Ser: 0.67 mg/dL (ref 0.44–1.00)
GFR calc Af Amer: >60 mL/min (ref >60)
GFR calc non Af Amer: >60 mL/min (ref >60)
Glucose, Bld: 92 mg/dL (ref 65–99)
Potassium: 3.4 mmol/L — ABNORMAL LOW (ref 3.5–5.1)
Sodium: 143 mmol/L (ref 135–145)
Total Bilirubin: 0.3 mg/dL (ref 0.3–1.2)
Total Protein: 7.6 g/dL (ref 6.5–8.1)

## 2016-02-11 LAB — CBC WITH DIFFERENTIAL/PLATELET
Basophils Absolute: 0 10*3/uL (ref 0–0.1)
Basophils Relative: 1 %
Eosinophils Absolute: 0.1 10*3/uL (ref 0–0.7)
Eosinophils Relative: 1 %
HCT: 28 % — ABNORMAL LOW (ref 35.0–47.0)
Hemoglobin: 8.9 g/dL — ABNORMAL LOW (ref 12.0–16.0)
Lymphocytes Relative: 21 %
Lymphs Abs: 0.8 10*3/uL — ABNORMAL LOW (ref 1.0–3.6)
MCH: 27.8 pg (ref 26.0–34.0)
MCHC: 31.9 g/dL — ABNORMAL LOW (ref 32.0–36.0)
MCV: 87 fL (ref 80.0–100.0)
Monocytes Absolute: 0.3 10*3/uL (ref 0.2–0.9)
Monocytes Relative: 9 %
Neutro Abs: 2.6 10*3/uL (ref 1.4–6.5)
Neutrophils Relative %: 68 %
Platelets: 265 10*3/uL (ref 150–440)
RBC: 3.22 MIL/uL — ABNORMAL LOW (ref 3.80–5.20)
RDW: 15.4 % — ABNORMAL HIGH (ref 11.5–14.5)
WBC: 3.9 10*3/uL (ref 3.6–11.0)

## 2016-02-11 LAB — VITAMIN B12: Vitamin B-12: 238 pg/mL (ref 180–914)

## 2016-02-11 LAB — RETICULOCYTES
RBC.: 3.23 MIL/uL — ABNORMAL LOW (ref 3.80–5.20)
Retic Count, Absolute: 42 10*3/uL (ref 19.0–183.0)
Retic Ct Pct: 1.3 % (ref 0.4–3.1)

## 2016-02-11 LAB — IRON AND TIBC
Iron: 27 ug/dL — ABNORMAL LOW (ref 28–170)
Saturation Ratios: 7 % — ABNORMAL LOW (ref 10.4–31.8)
TIBC: 405 ug/dL (ref 250–450)
UIBC: 378 ug/dL

## 2016-02-11 LAB — FERRITIN: Ferritin: 5 ng/mL — ABNORMAL LOW (ref 11–307)

## 2016-02-11 LAB — FOLATE: Folate: 22 ng/mL (ref >5.9)

## 2016-02-11 NOTE — Telephone Encounter (Signed)
-----   Message from Lequita Asal, MD sent at 02/11/2016 12:12 PM EST ----- Regarding: Please call patient  Potassium a little low.  Potassium rich food.  M  ----- Message ----- From: Interface, Lab In [place] Sent: 02/11/2016  10:37 AM To: Lequita Asal, MD

## 2016-02-11 NOTE — Telephone Encounter (Signed)
-----   Message from Lequita Asal, MD sent at 02/11/2016 12:48 PM EST ----- Regarding: Notify patient and Preauth Venofer  Ferritin single digit. HCT dropping. Start Venofer ASAP.  M  ----- Message ----- From: Interface, Lab In [place] Sent: 02/11/2016  10:37 AM To: Lequita Asal, MD

## 2016-02-11 NOTE — Telephone Encounter (Signed)
Called patient and LVM  to inform her that her potassium is low.  She should include potassium rich foods in her diet like bananas, orange juice, potatoes, sweet potatoes.

## 2016-02-11 NOTE — Telephone Encounter (Signed)
Called patient and left message that her ferritin and HCT are low.  She will need IV venofer.  We will contact patient's insurance company to authorize Venofer and get back in touch with patient to get her set up for infusion.

## 2016-02-12 LAB — CEA: CEA: 1.8 ng/mL (ref 0.0–4.7)

## 2016-02-12 NOTE — Telephone Encounter (Signed)
Attempted to call patient.  No answer and no voice mailbox.  Will call back.

## 2016-02-13 ENCOUNTER — Telehealth: Payer: Self-pay | Admitting: *Deleted

## 2016-02-13 NOTE — Telephone Encounter (Signed)
Spoke with [place] in insurance.  Venofer has been approved.

## 2016-02-13 NOTE — Telephone Encounter (Signed)
Called and left message that potassium 3.4 and she could eat some potassium rich foods like sweet potatoes, bananas, avocados, beans, tomatoes, salmon, spinach, apricots, potatoes.  Told her about  Her ferritin low and she would like to give her iron and if she wants to to please call us back

## 2016-02-18 ENCOUNTER — Telehealth: Payer: Self-pay | Admitting: *Deleted

## 2016-02-18 NOTE — Telephone Encounter (Signed)
-----   Message from Lequita Asal, MD sent at 02/11/2016 12:48 PM EST ----- Regarding: Notify patient and Preauth Venofer  Ferritin single digit. HCT dropping. Start Venofer ASAP.  M  ----- Message ----- From: Interface, Lab In [place] Sent: 02/11/2016  10:37 AM To: Lequita Asal, MD

## 2016-02-18 NOTE — Telephone Encounter (Signed)
Attempted to call patient and no answer and no availability to LVM.  Venofer has been approved.  Had scheduler put her on for Venofer on 02-22-16 when she comes back for results of CT of abdomen and pelvis.

## 2016-02-18 NOTE — Telephone Encounter (Signed)
-----   Message from Lequita Asal, MD sent at 02/11/2016  8:16 PM EST ----- Regarding: Needs MMA at next blood draw  Low normal B12.  M  ----- Message ----- From: Interface, Lab In [place] Sent: 02/11/2016  10:37 AM To: Lequita Asal, MD

## 2016-02-18 NOTE — Telephone Encounter (Signed)
Pt called to inform [place] that DR Mike Gip asked her to call once medication approval recd from ins company. Pt stated she received authorization from ins company and wanted the [place]. To be aware.

## 2016-02-18 NOTE — Telephone Encounter (Signed)
Attempted to call patient with results. No answer and no availability to LVM. Will continue to attempt calling.

## 2016-02-19 ENCOUNTER — Ambulatory Visit
Admission: RE | Admit: 2016-02-19 | Discharge: 2016-02-19 | Disposition: A | Discharge status: Home / Self Care | Payer: Commercial Managed Care - HMO | Source: Ambulatory Visit | Attending: Hematology and Oncology | Admitting: Hematology and Oncology

## 2016-02-19 DIAGNOSIS — Z9049 Acquired absence of other specified parts of digestive tract: Secondary | ICD-10-CM | POA: Insufficient documentation

## 2016-02-19 DIAGNOSIS — N200 Calculus of kidney: Secondary | ICD-10-CM | POA: Diagnosis not present

## 2016-02-19 DIAGNOSIS — I700 Atherosclerosis of aorta: Secondary | ICD-10-CM | POA: Insufficient documentation

## 2016-02-19 DIAGNOSIS — C189 Malignant neoplasm of colon, unspecified: Secondary | ICD-10-CM | POA: Diagnosis not present

## 2016-02-19 DIAGNOSIS — N281 Cyst of kidney, acquired: Secondary | ICD-10-CM | POA: Diagnosis not present

## 2016-02-19 DIAGNOSIS — C180 Malignant neoplasm of cecum: Secondary | ICD-10-CM | POA: Diagnosis not present

## 2016-02-22 ENCOUNTER — Inpatient Hospital Stay: Payer: Commercial Managed Care - HMO

## 2016-02-22 ENCOUNTER — Encounter: Payer: Self-pay | Admitting: Hematology and Oncology

## 2016-02-22 ENCOUNTER — Other Ambulatory Visit: Payer: Self-pay | Admitting: Hematology and Oncology

## 2016-02-22 ENCOUNTER — Inpatient Hospital Stay: Payer: Commercial Managed Care - HMO | Attending: Hematology and Oncology | Admitting: Hematology and Oncology

## 2016-02-22 VITALS — BP 137/86 | HR 73 | Temp 97.2°F | Resp 20 | Wt 173.1 lb

## 2016-02-22 DIAGNOSIS — N202 Calculus of kidney with calculus of ureter: Secondary | ICD-10-CM | POA: Diagnosis not present

## 2016-02-22 DIAGNOSIS — Z9049 Acquired absence of other specified parts of digestive tract: Secondary | ICD-10-CM | POA: Diagnosis not present

## 2016-02-22 DIAGNOSIS — D509 Iron deficiency anemia, unspecified: Secondary | ICD-10-CM

## 2016-02-22 DIAGNOSIS — Z79899 Other long term (current) drug therapy: Secondary | ICD-10-CM

## 2016-02-22 DIAGNOSIS — E785 Hyperlipidemia, unspecified: Secondary | ICD-10-CM | POA: Diagnosis not present

## 2016-02-22 DIAGNOSIS — C189 Malignant neoplasm of colon, unspecified: Secondary | ICD-10-CM | POA: Diagnosis not present

## 2016-02-22 DIAGNOSIS — K580 Irritable bowel syndrome with diarrhea: Secondary | ICD-10-CM | POA: Insufficient documentation

## 2016-02-22 DIAGNOSIS — I10 Essential (primary) hypertension: Secondary | ICD-10-CM | POA: Insufficient documentation

## 2016-02-22 DIAGNOSIS — G629 Polyneuropathy, unspecified: Secondary | ICD-10-CM

## 2016-02-22 DIAGNOSIS — N281 Cyst of kidney, acquired: Secondary | ICD-10-CM | POA: Diagnosis not present

## 2016-02-22 DIAGNOSIS — Z86010 Personal history of colonic polyps: Secondary | ICD-10-CM | POA: Diagnosis not present

## 2016-02-22 DIAGNOSIS — Z87442 Personal history of urinary calculi: Secondary | ICD-10-CM | POA: Insufficient documentation

## 2016-02-22 DIAGNOSIS — F17210 Nicotine dependence, cigarettes, uncomplicated: Secondary | ICD-10-CM

## 2016-02-22 DIAGNOSIS — C180 Malignant neoplasm of cecum: Secondary | ICD-10-CM

## 2016-02-22 MED ORDER — SODIUM CHLORIDE 0.9 % IV SOLN
Freq: Once | INTRAVENOUS | Status: AC
  Administered 2016-02-22: 16:00:00 via INTRAVENOUS
  Filled 2016-02-22: qty 1000

## 2016-02-22 MED ORDER — IRON SUCROSE 20 MG/ML IV SOLN
200.0000 mg | Freq: Once | INTRAVENOUS | Status: AC
  Administered 2016-02-22: 200 mg via INTRAVENOUS
  Filled 2016-02-22: qty 10

## 2016-02-22 NOTE — Progress Notes (Signed)
[facility] day:  02/22/2016  Chief Complaint: Melissa Fitzgerald is a 70 y.o. female with stage III colon cancer who is seen for review of interval studies and initiation of IV iron.  HPI:  The patient was last seen in the medical oncology clinic on 02/11/2016.  At that time, she was seen for initial assessment by me.  Symptomatically, she noted arthritis pain for which she was using BC powders 5 times a week. She described significant diarrhea over the summer which was resolving.  Labs at last visit included a hematocrit of 28.0, hemoglobin 8.9, MCV 87, platelets 265,000, white count 3900 with an ANC of 2600.  Ferritin was 5.  Iron studies included an iron saturation of 7% and a TIBC of 405.  Retic was 1.3%.  Comprehensive metabolic panel noted a potassium of 3.4. Creatinine was 0.67. Liver function tests were normal.  Folate was 22.0.  B12 was 238.  CEA was 1.8.  She was contacted about her severe iron deficiency anemia and falling hematocrit.  Hematocrit was 34.9 with a hemoglobin 11.4 on 10/30/2015.  IV iron was preauthorized.  Chest, abdomen, and pelvic CT scan on 02/19/2016 revealed previous right colectomy.  There was no definite evidence of recurrent or metastatic carcinoma.  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  Recommend followup chest CT in 6 months.  There was blateral nonobstructive nephrolithiasis and left renal cysts. There was no evidence of ureteral calculi or hydronephrosis.  Symptomatically, she denies any blood in her stool or urine.  She is taking Excedrin for her headache.  She is eating better and taking oral iron.   Past Medical History:  Diagnosis Date  . Anemia   . Colon polyp   . Hyperlipidemia   . Hypertension   . Irritable bowel disease   . Kidney stone   . Thyroid disease     Past Surgical History:  Procedure Laterality Date  . ABDOMINAL  HYSTERECTOMY    . BREAST ENHANCEMENT SURGERY    . CHOLECYSTECTOMY  2000  . COLECTOMY  09/2013  . COLONOSCOPY  09-13-13   Dr [name]  . COLONOSCOPY WITH PROPOFOL N/A 10/18/2014   Procedure: COLONOSCOPY WITH PROPOFOL;  Surgeon: [name], MD;  Location: [facility] ENDOSCOPY;  Service: Endoscopy;  Laterality: N/A;  . ESOPHAGOGASTRODUODENOSCOPY (EGD) WITH PROPOFOL N/A 01/03/2015   Procedure: ESOPHAGOGASTRODUODENOSCOPY (EGD) WITH PROPOFOL;  Surgeon: [name], MD;  Location: [facility] ENDOSCOPY;  Service: Endoscopy;  Laterality: N/A;  . FOOT SURGERY    . KIDNEY STONE SURGERY    . NASAL SINUS SURGERY    . PORTACATH PLACEMENT  10/24/13  . THYROID SURGERY    . TONSILLECTOMY      Family History  Problem Relation Age of Onset  . Heart disease Mother   . Diabetes Mother   . Arthritis Sister   . Hyperlipidemia Sister   . COPD Brother   . Diabetes Sister   . Colon cancer      first cousin  . Breast cancer Maternal Aunt   . Breast cancer Paternal Aunt     Social History:  reports that she has been smoking Cigarettes.  She has been smoking about 0.25 packs per day. She has never used smokeless tobacco. She reports that she drinks alcohol. She reports that she does not use drugs.  She lives in [place].  The patient is alone today.  Allergies:  Allergies  Allergen Reactions  . Codeine Nausea And Vomiting  . Oxycodone Nausea And Vomiting  . Penicillins Nausea And Vomiting  . Tramadol Nausea And Vomiting    Current Medications: Current Outpatient Prescriptions  Medication Sig Dispense Refill  . diphenoxylate-atropine (LOMOTIL) 2.5-0.025 MG tablet Take 1 tablet by mouth 3 (three) times daily as needed for diarrhea or loose stools. 21 tablet 0  . esomeprazole (NEXIUM) 40 MG capsule Take 1 capsule (40 mg total) by mouth daily at 12 noon. 30 capsule 3  . levothyroxine (SYNTHROID, LEVOTHROID) 100 MCG tablet TAKE ONE TABLET BY MOUTH ONCE DAILY BEFORE BREAKFAST 30 tablet 6  .  metoprolol succinate (TOPROL-XL) 50 MG 24 hr tablet TAKE ONE TABLET BY MOUTH ONCE DAILY 30 tablet 3  . venlafaxine XR (EFFEXOR-XR) 75 MG 24 hr capsule Take 1 capsule (75 mg total) by mouth daily with breakfast. 30 capsule 3  . zolpidem (AMBIEN) 5 MG tablet Take 1 tablet (5 mg total) by mouth at bedtime as needed for sleep. 15 tablet 1  . doxycycline (ADOXA) 100 MG tablet Take 1 tablet (100 mg total) by mouth 2 (two) times daily. (Patient not taking: Reported on 02/22/2016) 20 tablet 0  . Ferrous Fumarate 324 (106 FE) MG TABS Take 1 tablet by mouth daily. (Patient not taking: Reported on 02/22/2016) 30 tablet 3  . meclizine (ANTIVERT) 25 MG tablet Take 1 tablet (25 mg total) by mouth 4 (four) times daily as needed for dizziness. Nausea or anxiety. (Patient not taking: Reported on 02/22/2016) 30 tablet 3   No current facility-administered medications for this visit.     Review of Systems:  GENERAL:  Feels "ok".  No fevers or sweats.  Weight down 3 pounds. PERFORMANCE STATUS (ECOG):  1 HEENT:  No visual changes, runny nose, sore throat, mouth sores or tenderness. Lungs: No shortness of breath or cough.  No hemoptysis. Cardiac:  No chest pain, palpitations, orthopnea, or PND. GI:  Eating better. No nausea, vomiting, diarrhea, constipation, melena or hematochezia. GU:  No urgency, frequency, dysuria, or hematuria. Musculoskeletal:  Intermittent back pain.  Arthritis in hands.  No muscle tenderness. Extremities:  No pain or swelling. Skin:  No rashes or skin changes. Neuro:  Headaches, taking Excedrin.  No numbness or weakness, balance or coordination issues. Endocrine:  No diabetes.  Thyroid disease on Synthroid.  No hot flashes or night sweats. Psych:  No mood changes, depression or anxiety. Pain:  No focal pain. Review of systems:  All other systems reviewed and found   Physical Exam: Blood pressure 137/86, pulse 73, temperature 97.2 F (36.2 C), temperature source Tympanic, resp. rate 20,  weight 173 lb 1 oz (78.5 kg). GENERAL:  Well developed, well nourished, woman sitting comfortably in the exam room in no acute distress. MENTAL STATUS:  Alert and oriented to person, place and time. HEAD:  Wearing a black cap with sequins.  Short gray hair.  Normocephalic, atraumatic, face symmetric, no Cushingoid features. EYES:  Hazel eyes.  No conjunctivitis or scleral icterus. RESPIRATORY:  Clear to auscultation without rales, wheezes or rhonchi. CARDIOVASCULAR:  Regular rate and rhythm without murmur, rub or gallop. EXTREMITIES: No edema, no skin discoloration or tenderness.  No palpable cords. NEUROLOGICAL: Unremarkable. PSYCH:  Appropriate.   No visits with results within 3 Day(s) from this visit.  Latest known visit with results is:  Appointment on 02/11/2016  Component Date Value Ref Range Status  . CEA 02/12/2016 1.8  0.0 - 4.7 ng/mL Final   Comment: (NOTE)  Roche ECLIA methodology       Nonsmokers  <3.9                                     Smokers     <5.6 Performed At: [facility] [place], [place] [id number] Lindon Romp MD [id number]   . WBC 02/11/2016 3.9  3.6 - 11.0 K/uL Final  . RBC 02/11/2016 3.22* 3.80 - 5.20 MIL/uL Final  . Hemoglobin 02/11/2016 8.9* 12.0 - 16.0 g/dL Final  . HCT 02/11/2016 28.0* 35.0 - 47.0 % Final  . MCV 02/11/2016 87.0  80.0 - 100.0 fL Final  . MCH 02/11/2016 27.8  26.0 - 34.0 pg Final  . MCHC 02/11/2016 31.9* 32.0 - 36.0 g/dL Final  . RDW 02/11/2016 15.4* 11.5 - 14.5 % Final  . Platelets 02/11/2016 265  150 - 440 K/uL Final  . Neutrophils Relative % 02/11/2016 68  % Final  . Neutro Abs 02/11/2016 2.6  1.4 - 6.5 K/uL Final  . Lymphocytes Relative 02/11/2016 21  % Final  . Lymphs Abs 02/11/2016 0.8* 1.0 - 3.6 K/uL Final  . Monocytes Relative 02/11/2016 9  % Final  . Monocytes Absolute 02/11/2016 0.3  0.2 - 0.9 K/uL Final  . Eosinophils Relative 02/11/2016 1  % Final  . Eosinophils Absolute 02/11/2016 0.1   0 - 0.7 K/uL Final  . Basophils Relative 02/11/2016 1  % Final  . Basophils Absolute 02/11/2016 0.0  0 - 0.1 K/uL Final  . Sodium 02/11/2016 143  135 - 145 mmol/L Final  . Potassium 02/11/2016 3.4* 3.5 - 5.1 mmol/L Final  . Chloride 02/11/2016 105  101 - 111 mmol/L Final  . CO2 02/11/2016 30  22 - 32 mmol/L Final  . Glucose, Bld 02/11/2016 92  65 - 99 mg/dL Final  . BUN 02/11/2016 17  6 - 20 mg/dL Final  . Creatinine, Ser 02/11/2016 0.67  0.44 - 1.00 mg/dL Final  . Calcium 02/11/2016 8.9  8.9 - 10.3 mg/dL Final  . Total Protein 02/11/2016 7.6  6.5 - 8.1 g/dL Final  . Albumin 02/11/2016 3.9  3.5 - 5.0 g/dL Final  . AST 02/11/2016 24  15 - 41 U/L Final  . ALT 02/11/2016 14  14 - 54 U/L Final  . Alkaline Phosphatase 02/11/2016 50  38 - 126 U/L Final  . Total Bilirubin 02/11/2016 0.3  0.3 - 1.2 mg/dL Final  . GFR calc non Af Amer 02/11/2016 >60  >60 mL/min Final  . GFR calc Af Amer 02/11/2016 >60  >60 mL/min Final   Comment: (NOTE) The eGFR has been calculated using the CKD EPI equation. This calculation has not been validated in all clinical situations. eGFR's persistently <60 mL/min signify possible Chronic Kidney Disease.   . Anion gap 02/11/2016 8  5 - 15 Final  . Ferritin 02/11/2016 5* 11 - 307 ng/mL Final  . Iron 02/11/2016 27* 28 - 170 ug/dL Final  . TIBC 02/11/2016 405  250 - 450 ug/dL Final  . Saturation Ratios 02/11/2016 7* 10.4 - 31.8 % Final  . UIBC 02/11/2016 378  ug/dL Final  . Vitamin B-12 02/11/2016 238  180 - 914 pg/mL Final   Comment: (NOTE) This assay is not validated for testing neonatal or myeloproliferative syndrome specimens for Vitamin B12 levels. Performed at [facility]   . Folate 02/11/2016 22.0  >5.9 ng/mL Final  . Retic Ct  Pct 02/11/2016 1.3  0.4 - 3.1 % Final  . RBC. 02/11/2016 3.23* 3.80 - 5.20 MIL/uL Final  . Retic Count, Manual 02/11/2016 42.0  19.0 - 183.0 K/uL Final    Assessment:  Melissa Fitzgerald is a 70 y.o. female with stage III  colon cancer s/p right hemicolectomy on 09/29/2013.  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  There was lymph-vascular invasion.  There was no perineural invasion.  Margins were negative.  One of 14 lymph nodes were negative.  Pathologic stage was T3N1aM0.  She received 12 cycles of adjuvant chemotherapy.  She received 6 cycles of FOLFOX.  FOLFOX was switched to 5FU/LV secondary to a neuropathy.    Abdomen and pelvic CT scan without contrast on 02/07/2014 revealed a distal right ureteral calculus causing high grade obstruction of the right ureter and kidney.  Chest, abdomen, and pelvic CT scan on 02/19/2016 revealed no definite evidence of recurrent or metastatic carcinoma.  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  Recommend followup chest CT in 6 months.  There was blateral nonobstructive nephrolithiasis and left renal cysts.   Colonoscopy on 10/17/2014 was normal.  EGD on 01/03/2015 revealed diffuse moderate inflammation in the stomach.  Pathology revealed healing erosive gastritis.  There was no H pylori, dysplasia or malignancy.  CEA has been followed:  14.0 on 09/22/2013, 2.9 on 10/17/2013, 1.9 on 04/19/2014, 1.9 on 10/16/2014, 1.9 on 01/16/2015, 1.6 on 07/18/2015, and 1.8 on 02/11/2016.  She has iron deficiency anemia.  Labs on 02/11/2016 revealed a ferritin was 5 with an iron saturation of 7% and a TIBC of 405. Retic was 1.3%.  Normal labs included a creatinine and folate.  B12 was 238 (low normal).  Retic was 1.3%.  Symptomatically, she is eating better and taking oral iron.  She is taking Excedrin for headaches.  Plan: 1.  Review chest, abdomen, and pelvic CT scan.  No evidence of recurrent disease.  Discuss plan for follow-up chest CT in 6 months. 2.  Discuss iron deficiency anemia.  Discuss concern for use of Excedrin and gastritis (previously  took BC powders).  Discuss oral iron or IV iron if unable to replete iron stores.  Patient wishes to proceed with IV iron.  Goal ferritin is 100. 3.  Discuss low normal B12.  Discuss checking MMA to ensure not B12 deficient. 4.  Guaiac cards and urinalysis today 5.  Venofer today 6.  RTC weekly x 3 for Venofer 7.  CBC and MMA on 03/07/2016. 8.  RTC on 03/21/2016 for MD assessment, labs (CBC with diff, ferritin- day before) +/- Venofer.   Lequita Asal, MD  02/22/2016, 3:12 PM

## 2016-02-29 ENCOUNTER — Inpatient Hospital Stay: Payer: Commercial Managed Care - HMO

## 2016-02-29 VITALS — BP 129/74 | HR 73 | Temp 97.3°F | Resp 18

## 2016-02-29 DIAGNOSIS — K580 Irritable bowel syndrome with diarrhea: Secondary | ICD-10-CM | POA: Diagnosis not present

## 2016-02-29 DIAGNOSIS — G629 Polyneuropathy, unspecified: Secondary | ICD-10-CM | POA: Diagnosis not present

## 2016-02-29 DIAGNOSIS — F17210 Nicotine dependence, cigarettes, uncomplicated: Secondary | ICD-10-CM | POA: Diagnosis not present

## 2016-02-29 DIAGNOSIS — Z79899 Other long term (current) drug therapy: Secondary | ICD-10-CM | POA: Diagnosis not present

## 2016-02-29 DIAGNOSIS — D509 Iron deficiency anemia, unspecified: Secondary | ICD-10-CM | POA: Diagnosis not present

## 2016-02-29 DIAGNOSIS — N281 Cyst of kidney, acquired: Secondary | ICD-10-CM | POA: Diagnosis not present

## 2016-02-29 DIAGNOSIS — E785 Hyperlipidemia, unspecified: Secondary | ICD-10-CM | POA: Diagnosis not present

## 2016-02-29 DIAGNOSIS — C189 Malignant neoplasm of colon, unspecified: Secondary | ICD-10-CM | POA: Diagnosis not present

## 2016-02-29 DIAGNOSIS — I10 Essential (primary) hypertension: Secondary | ICD-10-CM | POA: Diagnosis not present

## 2016-02-29 MED ORDER — IRON SUCROSE 20 MG/ML IV SOLN
200.0000 mg | Freq: Once | INTRAVENOUS | Status: AC
  Administered 2016-02-29: 200 mg via INTRAVENOUS
  Filled 2016-02-29: qty 10

## 2016-02-29 MED ORDER — SODIUM CHLORIDE 0.9 % IV SOLN
Freq: Once | INTRAVENOUS | Status: AC
  Administered 2016-02-29: 14:00:00 via INTRAVENOUS
  Filled 2016-02-29: qty 1000

## 2016-02-29 MED ORDER — SODIUM CHLORIDE 0.9 % IV SOLN: 200.0000 mg | Freq: Once | INTRAVENOUS | Status: DC

## 2016-03-07 ENCOUNTER — Inpatient Hospital Stay: Payer: Commercial Managed Care - HMO

## 2016-03-07 ENCOUNTER — Other Ambulatory Visit: Payer: Self-pay

## 2016-03-07 VITALS — BP 147/87 | HR 72 | Temp 98.3°F | Resp 18

## 2016-03-07 DIAGNOSIS — D509 Iron deficiency anemia, unspecified: Secondary | ICD-10-CM

## 2016-03-07 DIAGNOSIS — E785 Hyperlipidemia, unspecified: Secondary | ICD-10-CM | POA: Diagnosis not present

## 2016-03-07 DIAGNOSIS — F17210 Nicotine dependence, cigarettes, uncomplicated: Secondary | ICD-10-CM | POA: Diagnosis not present

## 2016-03-07 DIAGNOSIS — I10 Essential (primary) hypertension: Secondary | ICD-10-CM | POA: Diagnosis not present

## 2016-03-07 DIAGNOSIS — Z79899 Other long term (current) drug therapy: Secondary | ICD-10-CM | POA: Diagnosis not present

## 2016-03-07 DIAGNOSIS — C189 Malignant neoplasm of colon, unspecified: Secondary | ICD-10-CM | POA: Diagnosis not present

## 2016-03-07 DIAGNOSIS — K580 Irritable bowel syndrome with diarrhea: Secondary | ICD-10-CM | POA: Diagnosis not present

## 2016-03-07 DIAGNOSIS — G629 Polyneuropathy, unspecified: Secondary | ICD-10-CM | POA: Diagnosis not present

## 2016-03-07 DIAGNOSIS — C180 Malignant neoplasm of cecum: Secondary | ICD-10-CM

## 2016-03-07 DIAGNOSIS — N281 Cyst of kidney, acquired: Secondary | ICD-10-CM | POA: Diagnosis not present

## 2016-03-07 LAB — CBC WITH DIFFERENTIAL/PLATELET
Basophils Absolute: 0 10*3/uL (ref 0–0.1)
Basophils Relative: 1 %
Eosinophils Absolute: 0.1 10*3/uL (ref 0–0.7)
Eosinophils Relative: 2 %
HCT: 30.5 % — ABNORMAL LOW (ref 35.0–47.0)
Hemoglobin: 9.6 g/dL — ABNORMAL LOW (ref 12.0–16.0)
Lymphocytes Relative: 27 %
Lymphs Abs: 0.9 10*3/uL — ABNORMAL LOW (ref 1.0–3.6)
MCH: 27.3 pg (ref 26.0–34.0)
MCHC: 31.3 g/dL — ABNORMAL LOW (ref 32.0–36.0)
MCV: 87.1 fL (ref 80.0–100.0)
Monocytes Absolute: 0.2 10*3/uL (ref 0.2–0.9)
Monocytes Relative: 7 %
Neutro Abs: 2 10*3/uL (ref 1.4–6.5)
Neutrophils Relative %: 63 %
Platelets: 236 10*3/uL (ref 150–440)
RBC: 3.5 MIL/uL — ABNORMAL LOW (ref 3.80–5.20)
RDW: 16.4 % — ABNORMAL HIGH (ref 11.5–14.5)
WBC: 3.2 10*3/uL — ABNORMAL LOW (ref 3.6–11.0)

## 2016-03-07 MED ORDER — SODIUM CHLORIDE 0.9 % IV SOLN
Freq: Once | INTRAVENOUS | Status: AC
  Administered 2016-03-07: 14:00:00 via INTRAVENOUS
  Filled 2016-03-07: qty 1000

## 2016-03-07 MED ORDER — IRON SUCROSE 20 MG/ML IV SOLN
200.0000 mg | Freq: Once | INTRAVENOUS | Status: AC
  Administered 2016-03-07: 200 mg via INTRAVENOUS
  Filled 2016-03-07: qty 10

## 2016-03-07 MED ORDER — SODIUM CHLORIDE 0.9 % IV SOLN: 200.0000 mg | Freq: Once | INTRAVENOUS | Status: DC

## 2016-03-11 ENCOUNTER — Other Ambulatory Visit: Payer: Self-pay | Admitting: Hematology and Oncology

## 2016-03-12 ENCOUNTER — Encounter: Payer: Self-pay | Admitting: *Deleted

## 2016-03-12 LAB — METHYLMALONIC ACID, SERUM: Methylmalonic Acid, Quantitative: 151 nmol/L (ref 0–378)

## 2016-03-13 ENCOUNTER — Encounter: Payer: Self-pay | Admitting: Hematology and Oncology

## 2016-03-14 ENCOUNTER — Inpatient Hospital Stay: Payer: Commercial Managed Care - HMO

## 2016-03-14 VITALS — BP 147/94 | HR 74 | Temp 97.5°F | Resp 18

## 2016-03-14 DIAGNOSIS — I10 Essential (primary) hypertension: Secondary | ICD-10-CM | POA: Diagnosis not present

## 2016-03-14 DIAGNOSIS — K580 Irritable bowel syndrome with diarrhea: Secondary | ICD-10-CM | POA: Diagnosis not present

## 2016-03-14 DIAGNOSIS — Z79899 Other long term (current) drug therapy: Secondary | ICD-10-CM | POA: Diagnosis not present

## 2016-03-14 DIAGNOSIS — F17210 Nicotine dependence, cigarettes, uncomplicated: Secondary | ICD-10-CM | POA: Diagnosis not present

## 2016-03-14 DIAGNOSIS — E785 Hyperlipidemia, unspecified: Secondary | ICD-10-CM | POA: Diagnosis not present

## 2016-03-14 DIAGNOSIS — G629 Polyneuropathy, unspecified: Secondary | ICD-10-CM | POA: Diagnosis not present

## 2016-03-14 DIAGNOSIS — D509 Iron deficiency anemia, unspecified: Secondary | ICD-10-CM

## 2016-03-14 DIAGNOSIS — C189 Malignant neoplasm of colon, unspecified: Secondary | ICD-10-CM | POA: Diagnosis not present

## 2016-03-14 DIAGNOSIS — N281 Cyst of kidney, acquired: Secondary | ICD-10-CM | POA: Diagnosis not present

## 2016-03-14 MED ORDER — SODIUM CHLORIDE 0.9 % IV SOLN
Freq: Once | INTRAVENOUS | Status: AC
  Administered 2016-03-14: 11:00:00 via INTRAVENOUS
  Filled 2016-03-14: qty 1000

## 2016-03-14 MED ORDER — IRON SUCROSE 20 MG/ML IV SOLN
200.0000 mg | Freq: Once | INTRAVENOUS | Status: AC
  Administered 2016-03-14: 200 mg via INTRAVENOUS
  Filled 2016-03-14: qty 10

## 2016-03-20 ENCOUNTER — Inpatient Hospital Stay: Payer: Commercial Managed Care - HMO

## 2016-03-20 ENCOUNTER — Telehealth: Payer: Self-pay | Admitting: Family Medicine

## 2016-03-20 DIAGNOSIS — D509 Iron deficiency anemia, unspecified: Secondary | ICD-10-CM | POA: Diagnosis not present

## 2016-03-20 DIAGNOSIS — K580 Irritable bowel syndrome with diarrhea: Secondary | ICD-10-CM | POA: Diagnosis not present

## 2016-03-20 DIAGNOSIS — E785 Hyperlipidemia, unspecified: Secondary | ICD-10-CM | POA: Diagnosis not present

## 2016-03-20 DIAGNOSIS — G629 Polyneuropathy, unspecified: Secondary | ICD-10-CM | POA: Diagnosis not present

## 2016-03-20 DIAGNOSIS — C180 Malignant neoplasm of cecum: Secondary | ICD-10-CM

## 2016-03-20 DIAGNOSIS — F17210 Nicotine dependence, cigarettes, uncomplicated: Secondary | ICD-10-CM | POA: Diagnosis not present

## 2016-03-20 DIAGNOSIS — I10 Essential (primary) hypertension: Secondary | ICD-10-CM | POA: Diagnosis not present

## 2016-03-20 DIAGNOSIS — C189 Malignant neoplasm of colon, unspecified: Secondary | ICD-10-CM | POA: Diagnosis not present

## 2016-03-20 DIAGNOSIS — Z79899 Other long term (current) drug therapy: Secondary | ICD-10-CM | POA: Diagnosis not present

## 2016-03-20 DIAGNOSIS — N281 Cyst of kidney, acquired: Secondary | ICD-10-CM | POA: Diagnosis not present

## 2016-03-20 LAB — CBC WITH DIFFERENTIAL/PLATELET
Basophils Absolute: 0 10*3/uL (ref 0–0.1)
Basophils Relative: 1 %
Eosinophils Absolute: 0 10*3/uL (ref 0–0.7)
Eosinophils Relative: 1 %
HCT: 30.8 % — ABNORMAL LOW (ref 35.0–47.0)
Hemoglobin: 9.9 g/dL — ABNORMAL LOW (ref 12.0–16.0)
Lymphocytes Relative: 21 %
Lymphs Abs: 0.9 10*3/uL — ABNORMAL LOW (ref 1.0–3.6)
MCH: 28 pg (ref 26.0–34.0)
MCHC: 32.2 g/dL (ref 32.0–36.0)
MCV: 86.9 fL (ref 80.0–100.0)
Monocytes Absolute: 0.2 10*3/uL (ref 0.2–0.9)
Monocytes Relative: 6 %
Neutro Abs: 3.1 10*3/uL (ref 1.4–6.5)
Neutrophils Relative %: 71 %
Platelets: 222 10*3/uL (ref 150–440)
RBC: 3.55 MIL/uL — ABNORMAL LOW (ref 3.80–5.20)
RDW: 18 % — ABNORMAL HIGH (ref 11.5–14.5)
WBC: 4.3 10*3/uL (ref 3.6–11.0)

## 2016-03-20 LAB — FERRITIN: Ferritin: 200 ng/mL (ref 11–307)

## 2016-03-20 NOTE — Telephone Encounter (Signed)
Called Pt to schedule AWV with [facility] - knb °

## 2016-03-21 ENCOUNTER — Inpatient Hospital Stay (HOSPITAL_BASED_OUTPATIENT_CLINIC_OR_DEPARTMENT_OTHER): Payer: Commercial Managed Care - HMO | Admitting: Hematology and Oncology

## 2016-03-21 ENCOUNTER — Encounter: Payer: Self-pay | Admitting: Hematology and Oncology

## 2016-03-21 ENCOUNTER — Inpatient Hospital Stay: Payer: Commercial Managed Care - HMO | Admitting: *Deleted

## 2016-03-21 ENCOUNTER — Inpatient Hospital Stay: Payer: Commercial Managed Care - HMO

## 2016-03-21 VITALS — BP 134/90 | HR 71 | Temp 96.4°F | Resp 18 | Wt 176.8 lb

## 2016-03-21 DIAGNOSIS — D509 Iron deficiency anemia, unspecified: Secondary | ICD-10-CM

## 2016-03-21 DIAGNOSIS — D649 Anemia, unspecified: Secondary | ICD-10-CM

## 2016-03-21 DIAGNOSIS — G629 Polyneuropathy, unspecified: Secondary | ICD-10-CM | POA: Diagnosis not present

## 2016-03-21 DIAGNOSIS — N281 Cyst of kidney, acquired: Secondary | ICD-10-CM | POA: Diagnosis not present

## 2016-03-21 DIAGNOSIS — E785 Hyperlipidemia, unspecified: Secondary | ICD-10-CM | POA: Diagnosis not present

## 2016-03-21 DIAGNOSIS — F17210 Nicotine dependence, cigarettes, uncomplicated: Secondary | ICD-10-CM

## 2016-03-21 DIAGNOSIS — Z79899 Other long term (current) drug therapy: Secondary | ICD-10-CM | POA: Diagnosis not present

## 2016-03-21 DIAGNOSIS — I10 Essential (primary) hypertension: Secondary | ICD-10-CM | POA: Diagnosis not present

## 2016-03-21 DIAGNOSIS — C189 Malignant neoplasm of colon, unspecified: Secondary | ICD-10-CM

## 2016-03-21 DIAGNOSIS — C180 Malignant neoplasm of cecum: Secondary | ICD-10-CM

## 2016-03-21 DIAGNOSIS — K580 Irritable bowel syndrome with diarrhea: Secondary | ICD-10-CM | POA: Diagnosis not present

## 2016-03-21 LAB — URINALYSIS, COMPLETE (UACMP) WITH MICROSCOPIC
Bacteria, UA: NONE SEEN
Bilirubin Urine: NEGATIVE
Bilirubin Urine: NEGATIVE
Glucose, UA: NEGATIVE mg/dL
Glucose, UA: NEGATIVE mg/dL
Hgb urine dipstick: NEGATIVE
Hgb urine dipstick: NEGATIVE
Ketones, ur: NEGATIVE mg/dL
Ketones, ur: NEGATIVE mg/dL
Leukocytes, UA: NEGATIVE
Leukocytes, UA: NEGATIVE
Nitrite: NEGATIVE
Nitrite: NEGATIVE
Protein, ur: NEGATIVE mg/dL
Protein, ur: NEGATIVE mg/dL
Specific Gravity, Urine: 1.017 (ref 1.005–1.030)
Specific Gravity, Urine: 1.017 (ref 1.005–1.030)
pH: 5 (ref 5.0–8.0)
pH: 5 (ref 5.0–8.0)

## 2016-03-21 LAB — FOLATE: Folate: 20.9 ng/mL (ref >5.9)

## 2016-03-21 NOTE — Progress Notes (Signed)
Patient c/o headache today.  States she is eating better and feels better.  BP elevated.  152/90 HR 71  Recheck 145/87 HR 71.

## 2016-03-21 NOTE — Progress Notes (Signed)
[facility] day:  03/21/2016  Chief Complaint: Melissa Fitzgerald is a 70 y.o. female with stage III colon cancer and iron deficiency anemia who is seen for 1 month assessment.  HPI:  The patient was last seen in the medical oncology clinic on 02/22/2016.  At that time, she was eating better and taking oral iron.  She was taking Excedrin for headaches.    Chest, abdomen, and pelvic CT scan revealed no evidence of recurrent disease.  We discussed concern for use of Excedrin and gastritis (previously took [facility] powders).  We discussed oral iron or IV iron if unable to replete iron stores.  Patient wished to proceed with IV iron.    Guaiac cards and urinalysis have not been performed.  MMA was 151 (normal) on 03/07/2016 thus ruling out B12 deficiency.  She received Venofer 200 mg weekly x 4 (02/22/2016 - 03/14/2016).  Labs on 03/20/2016 revealed a hematocrit of 30.8, hemoglobin 9.9, MCV 86.9, platelets 222,000, white count 4300 with an ANC of 3100.  Ferritin was 200.   Symptomatically, she feels "alright".  She does feel better than she did at her last visit.  She is eating better.  She is still taking Excedrin.   Past Medical History:  Diagnosis Date  . Anemia   . Colon polyp   . Hyperlipidemia   . Hypertension   . Irritable bowel disease   . Kidney stone   . Thyroid disease     Past Surgical History:  Procedure Laterality Date  . ABDOMINAL HYSTERECTOMY    . BREAST ENHANCEMENT SURGERY    . CHOLECYSTECTOMY  2000  . COLECTOMY  09/2013  . COLONOSCOPY  09-13-13   Dr [name]  . COLONOSCOPY WITH PROPOFOL N/A 10/18/2014   Procedure: COLONOSCOPY WITH PROPOFOL;  Surgeon: [name], MD;  Location: [facility] ENDOSCOPY;  Service: Endoscopy;  Laterality: N/A;  . ESOPHAGOGASTRODUODENOSCOPY (EGD) WITH PROPOFOL N/A 01/03/2015   Procedure: ESOPHAGOGASTRODUODENOSCOPY (EGD) WITH PROPOFOL;  Surgeon: [name], MD;  Location: [facility] ENDOSCOPY;   Service: Endoscopy;  Laterality: N/A;  . FOOT SURGERY    . KIDNEY STONE SURGERY    . NASAL SINUS SURGERY    . PORTACATH PLACEMENT  10/24/13  . THYROID SURGERY    . TONSILLECTOMY      Family History  Problem Relation Age of Onset  . Heart disease Mother   . Diabetes Mother   . Arthritis Sister   . Hyperlipidemia Sister   . COPD Brother   . Diabetes Sister   . Colon cancer      first cousin  . Breast cancer Maternal Aunt   . Breast cancer Paternal Aunt     Social History:  reports that she has been smoking Cigarettes.  She has been smoking about 0.25 packs per day. She has never used smokeless tobacco. She reports that she drinks alcohol. She reports that she does not use drugs.  She lives in [place].  The patient is alone today.  Allergies:  Allergies  Allergen Reactions  . Codeine Nausea And Vomiting  . Oxycodone Nausea And Vomiting  . Penicillins Nausea And Vomiting  . Tramadol Nausea And Vomiting    Current Medications: Current Outpatient Prescriptions  Medication Sig Dispense Refill  . diphenoxylate-atropine (LOMOTIL) 2.5-0.025 MG tablet Take 1 tablet by mouth 3 (three) times daily as needed for diarrhea or loose stools. 21 tablet 0  . esomeprazole (NEXIUM) 40 MG capsule Take 1 capsule (40 mg total)  by mouth daily at 12 noon. 30 capsule 3  . Ferrous Fumarate 324 (106 FE) MG TABS Take 1 tablet by mouth daily. 30 tablet 3  . levothyroxine (SYNTHROID, LEVOTHROID) 100 MCG tablet TAKE ONE TABLET BY MOUTH ONCE DAILY BEFORE BREAKFAST 30 tablet 6  . metoprolol succinate (TOPROL-XL) 50 MG 24 hr tablet TAKE ONE TABLET BY MOUTH ONCE DAILY 30 tablet 3  . venlafaxine XR (EFFEXOR-XR) 75 MG 24 hr capsule Take 1 capsule (75 mg total) by mouth daily with breakfast. 30 capsule 3  . zolpidem (AMBIEN) 5 MG tablet Take 1 tablet (5 mg total) by mouth at bedtime as needed for sleep. 15 tablet 1  . doxycycline (ADOXA) 100 MG tablet Take 1 tablet (100 mg total) by mouth 2 (two) times daily.  (Patient not taking: Reported on 03/21/2016) 20 tablet 0  . meclizine (ANTIVERT) 25 MG tablet Take 1 tablet (25 mg total) by mouth 4 (four) times daily as needed for dizziness. Nausea or anxiety. (Patient not taking: Reported on 03/21/2016) 30 tablet 3   No current facility-administered medications for this visit.     Review of Systems:  GENERAL:  Feels "alright".  No fevers or sweats.  Weight up 3 pounds. PERFORMANCE STATUS (ECOG):  1 HEENT:  No visual changes, runny nose, sore throat, mouth sores or tenderness. Lungs: No shortness of breath or cough.  No hemoptysis. Cardiac:  No chest pain, palpitations, orthopnea, or PND. GI:  Eating better. Little nausea.  No vomiting, diarrhea, constipation, melena or hematochezia. GU:  No urgency, frequency, dysuria, or hematuria. Musculoskeletal:  Intermittent back pain.  Arthritis in hands.  No muscle tenderness. Extremities:  No pain or swelling. Skin:  No rashes or skin changes. Neuro:  Headaches, still taking Excedrin.  No numbness or weakness, balance or coordination issues. Endocrine:  No diabetes.  Thyroid disease on Synthroid.  No hot flashes or night sweats. Psych:  No mood changes, depression or anxiety. Pain:  No focal pain. Review of systems:  All other systems reviewed and found   Physical Exam: Blood pressure (!) 145/87, pulse 71, temperature (!) 96.4 F (35.8 C), temperature source Tympanic, resp. rate 18, weight 176 lb 12.9 oz (80.2 kg). GENERAL:  Well developed, well nourished, woman sitting comfortably in the exam room in no acute distress. MENTAL STATUS:  Alert and oriented to person, place and time. HEAD:  Short curly gray hair.  Normocephalic, atraumatic, face symmetric, no Cushingoid features. EYES:  Hazel eyes.  Pupils equal round and reactive to light and accomodation.  No conjunctivitis or scleral icterus. ENT:  Oropharynx clear without lesion.  Tongue normal. Mucous membranes moist.  RESPIRATORY:  Clear to auscultation  without rales, wheezes or rhonchi. CARDIOVASCULAR:  Regular rate and rhythm without murmur, rub or gallop. ABDOMEN:  Soft, non-tender, with active bowel sounds, and no hepatosplenomegaly.  No masses. SKIN:  No rashes, ulcers or lesions. EXTREMITIES: No edema, no skin discoloration or tenderness.  No palpable cords. LYMPH NODES: No palpable cervical, supraclavicular, axillary or inguinal adenopathy  NEUROLOGICAL: Unremarkable. PSYCH:  Appropriate.   Appointment on 03/20/2016  Component Date Value Ref Range Status  . WBC 03/20/2016 4.3  3.6 - 11.0 K/uL Final  . RBC 03/20/2016 3.55* 3.80 - 5.20 MIL/uL Final  . Hemoglobin 03/20/2016 9.9* 12.0 - 16.0 g/dL Final  . HCT 03/20/2016 30.8* 35.0 - 47.0 % Final  . MCV 03/20/2016 86.9  80.0 - 100.0 fL Final  . MCH 03/20/2016 28.0  26.0 - 34.0 pg Final  .  MCHC 03/20/2016 32.2  32.0 - 36.0 g/dL Final  . RDW 03/20/2016 18.0* 11.5 - 14.5 % Final  . Platelets 03/20/2016 222  150 - 440 K/uL Final  . Neutrophils Relative % 03/20/2016 71  % Final  . Neutro Abs 03/20/2016 3.1  1.4 - 6.5 K/uL Final  . Lymphocytes Relative 03/20/2016 21  % Final  . Lymphs Abs 03/20/2016 0.9* 1.0 - 3.6 K/uL Final  . Monocytes Relative 03/20/2016 6  % Final  . Monocytes Absolute 03/20/2016 0.2  0.2 - 0.9 K/uL Final  . Eosinophils Relative 03/20/2016 1  % Final  . Eosinophils Absolute 03/20/2016 0.0  0 - 0.7 K/uL Final  . Basophils Relative 03/20/2016 1  % Final  . Basophils Absolute 03/20/2016 0.0  0 - 0.1 K/uL Final  . Ferritin 03/20/2016 200  11 - 307 ng/mL Final    Assessment:  MIAYA FORESMAN is a 70 y.o. female with stage III colon cancer s/p right hemicolectomy on 09/29/2013.  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  There was lymph-vascular invasion.  There was no perineural invasion.  Margins were negative.  One of 14 lymph nodes were negative.  Pathologic stage was T3N1aM0.  She received 12  cycles of adjuvant chemotherapy.  She received 6 cycles of FOLFOX.  FOLFOX was switched to 5FU/LV secondary to a neuropathy.    Abdomen and pelvic CT scan without contrast on 02/07/2014 revealed a distal right ureteral calculus causing high grade obstruction of the right ureter and kidney.  Chest, abdomen, and pelvic CT scan on 02/19/2016 revealed no definite evidence of recurrent or metastatic carcinoma.  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  Recommend followup chest CT in 6 months.  There was blateral nonobstructive nephrolithiasis and left renal cysts.   Colonoscopy on 10/17/2014 was normal.  EGD on 01/03/2015 revealed diffuse moderate inflammation in the stomach.  Pathology revealed healing erosive gastritis.  There was no H pylori, dysplasia or malignancy.  CEA has been followed:  14.0 on 09/22/2013, 2.9 on 10/17/2013, 1.9 on 04/19/2014, 1.9 on 10/16/2014, 1.9 on 01/16/2015, 1.6 on 07/18/2015, and 1.8 on 02/11/2016.  She has iron deficiency anemia.  Ferritin was 5 with an iron saturation of 7% and a TIBC of 405 on 02/11/2016.  Retic was 1.3%.  Normal labs included a creatinine and folate.  B12 was 238 (low normal) with a normal MMA (151) ruling out B12 deficiency.  Retic was 1.3%.  She received Venofer 200 mg weekly x 4 (02/22/2016 - 03/14/2016).  Hematocrit improved from 28 to 30.8 from 02/11/2016 to 03/21/2016.  Ferritin was 200 on 03/20/2016.  Symptomatically, she feels a little better.  She continues to take Excedrin for headaches.  Plan: 1. Review labs from yesterday and correction of iron deficiency.  Discuss persistent anemia.  Discuss additional labs today.  Check urinalysis to ensure no microscopic hematuria. 2.  Labs today:  ferritin, folate, ANA, TSH, free T4, hemoglobin electrophoresis. 3.  No Venofer today. 4.  Guaiac cards today. 5.  Urinalysis today. 6.  RTC in 2 weeks for MD assess and review of  testing.   Lequita Asal, MD  03/21/2016, 11:14 AM

## 2016-03-22 DIAGNOSIS — N281 Cyst of kidney, acquired: Secondary | ICD-10-CM | POA: Diagnosis not present

## 2016-03-22 DIAGNOSIS — D509 Iron deficiency anemia, unspecified: Secondary | ICD-10-CM | POA: Diagnosis not present

## 2016-03-22 DIAGNOSIS — C189 Malignant neoplasm of colon, unspecified: Secondary | ICD-10-CM | POA: Diagnosis not present

## 2016-03-22 DIAGNOSIS — K580 Irritable bowel syndrome with diarrhea: Secondary | ICD-10-CM | POA: Diagnosis not present

## 2016-03-22 DIAGNOSIS — E785 Hyperlipidemia, unspecified: Secondary | ICD-10-CM | POA: Diagnosis not present

## 2016-03-22 DIAGNOSIS — F17210 Nicotine dependence, cigarettes, uncomplicated: Secondary | ICD-10-CM | POA: Diagnosis not present

## 2016-03-22 DIAGNOSIS — G629 Polyneuropathy, unspecified: Secondary | ICD-10-CM | POA: Diagnosis not present

## 2016-03-22 DIAGNOSIS — I10 Essential (primary) hypertension: Secondary | ICD-10-CM | POA: Diagnosis not present

## 2016-03-22 DIAGNOSIS — Z79899 Other long term (current) drug therapy: Secondary | ICD-10-CM | POA: Diagnosis not present

## 2016-03-23 DIAGNOSIS — E785 Hyperlipidemia, unspecified: Secondary | ICD-10-CM | POA: Diagnosis not present

## 2016-03-23 DIAGNOSIS — C189 Malignant neoplasm of colon, unspecified: Secondary | ICD-10-CM | POA: Diagnosis not present

## 2016-03-23 DIAGNOSIS — D509 Iron deficiency anemia, unspecified: Secondary | ICD-10-CM | POA: Diagnosis not present

## 2016-03-23 DIAGNOSIS — G629 Polyneuropathy, unspecified: Secondary | ICD-10-CM | POA: Diagnosis not present

## 2016-03-23 DIAGNOSIS — K580 Irritable bowel syndrome with diarrhea: Secondary | ICD-10-CM | POA: Diagnosis not present

## 2016-03-23 DIAGNOSIS — I10 Essential (primary) hypertension: Secondary | ICD-10-CM | POA: Diagnosis not present

## 2016-03-23 DIAGNOSIS — N281 Cyst of kidney, acquired: Secondary | ICD-10-CM | POA: Diagnosis not present

## 2016-03-23 DIAGNOSIS — F17210 Nicotine dependence, cigarettes, uncomplicated: Secondary | ICD-10-CM | POA: Diagnosis not present

## 2016-03-23 DIAGNOSIS — Z79899 Other long term (current) drug therapy: Secondary | ICD-10-CM | POA: Diagnosis not present

## 2016-03-25 ENCOUNTER — Other Ambulatory Visit: Payer: Self-pay

## 2016-03-25 DIAGNOSIS — C180 Malignant neoplasm of cecum: Secondary | ICD-10-CM

## 2016-03-25 DIAGNOSIS — D509 Iron deficiency anemia, unspecified: Secondary | ICD-10-CM

## 2016-03-25 LAB — OCCULT BLOOD X 1 CARD TO LAB, STOOL
Fecal Occult Bld: NEGATIVE
Fecal Occult Bld: NEGATIVE
Fecal Occult Bld: NEGATIVE

## 2016-03-25 LAB — PROTEIN ELECTROPHORESIS, SERUM
A/G Ratio: 1.1 (ref 0.7–1.7)
Albumin ELP: 3.9 g/dL (ref 2.9–4.4)
Alpha-1-Globulin: 0.2 g/dL (ref 0.0–0.4)
Alpha-2-Globulin: 0.6 g/dL (ref 0.4–1.0)
Beta Globulin: 1 g/dL (ref 0.7–1.3)
Gamma Globulin: 1.8 g/dL (ref 0.4–1.8)
Globulin, Total: 3.7 g/dL (ref 2.2–3.9)
PDF: 0
Total Protein ELP: 7.6 g/dL (ref 6.0–8.5)

## 2016-03-25 LAB — HEMOGLOBINOPATHY EVALUATION
Hgb A2 Quant: 2.2 % (ref 1.8–3.2)
Hgb A: 97.8 % (ref 96.4–98.8)
Hgb C: 0 %
Hgb F Quant: 0 % (ref 0.0–2.0)
Hgb S Quant: 0 %
Hgb Variant: 0 %

## 2016-03-26 LAB — THYROID PANEL WITH TSH
Free Thyroxine Index: 2.7 (ref 1.2–4.9)
T3 Uptake Ratio: 26 % (ref 24–39)
T4, Total: 10.3 ug/dL (ref 4.5–12.0)
TSH: 0.367 u[IU]/mL — ABNORMAL LOW (ref 0.450–4.500)

## 2016-03-26 LAB — ENA+DNA/DS+ANTICH+CENTRO+JO...
Anti JO-1: <0.2 AI (ref 0.0–0.9)
Centromere Ab Screen: <0.2 AI (ref 0.0–0.9)
Chromatin Ab SerPl-aCnc: 0.4 AI (ref 0.0–0.9)
ENA SM Ab Ser-aCnc: 0.2 AI (ref 0.0–0.9)
Ribonucleic Protein: 0.3 AI (ref 0.0–0.9)
SSA (Ro) (ENA) Antibody, IgG: 1.8 AI — ABNORMAL HIGH (ref 0.0–0.9)
SSB (La) (ENA) Antibody, IgG: <0.2 AI (ref 0.0–0.9)
Scleroderma (Scl-70) (ENA) Antibody, IgG: <0.2 AI (ref 0.0–0.9)
ds DNA Ab: 2 IU/mL (ref 0–9)

## 2016-03-26 LAB — ANA W/REFLEX IF POSITIVE: Anti Nuclear Antibody(ANA): POSITIVE — AB

## 2016-04-04 ENCOUNTER — Inpatient Hospital Stay: Payer: Commercial Managed Care - HMO | Admitting: Hematology and Oncology

## 2016-04-04 NOTE — Progress Notes (Deleted)
[facility] day:  04/04/2016  Chief Complaint: Melissa Fitzgerald is a 71 y.o. female with stage III colon cancer and iron deficiency anemia who is seen for 2 week assessment and review of additional testing.  HPI:  The patient was last seen in the medical oncology clinic on 03/21/2016.  At that time, she had persistent anemia despite improvement in ferritin from 5 to 200.  Hematocrit was 30.8  Additional labs included the following normal studies: folate, SPEP, hemoglobin electrophoresis.  ANA was positive with a positive Ro (1.8).  TSH was 0.367 (low) with a normal T4 total, T3 uptake ratio, and free thyroxine index.  Guaiac cards were negative x 3 in 02/2016.  Urinalysis revealed no hematuria.  Symptomatically,   Past Medical History:  Diagnosis Date  . Anemia   . Colon polyp   . Hyperlipidemia   . Hypertension   . Irritable bowel disease   . Kidney stone   . Thyroid disease     Past Surgical History:  Procedure Laterality Date  . ABDOMINAL HYSTERECTOMY    . BREAST ENHANCEMENT SURGERY    . CHOLECYSTECTOMY  2000  . COLECTOMY  09/2013  . COLONOSCOPY  09-13-13   Dr [name]  . COLONOSCOPY WITH PROPOFOL N/A 10/18/2014   Procedure: COLONOSCOPY WITH PROPOFOL;  Surgeon: [name], MD;  Location: [facility];  Service: Endoscopy;  Laterality: N/A;  . ESOPHAGOGASTRODUODENOSCOPY (EGD) WITH PROPOFOL N/A 01/03/2015   Procedure: ESOPHAGOGASTRODUODENOSCOPY (EGD) WITH PROPOFOL;  Surgeon: [name], MD;  Location: [facility] ENDOSCOPY;  Service: Endoscopy;  Laterality: N/A;  . FOOT SURGERY    . KIDNEY STONE SURGERY    . NASAL SINUS SURGERY    . PORTACATH PLACEMENT  10/24/13  . THYROID SURGERY    . TONSILLECTOMY      Family History  Problem Relation Age of Onset  . Heart disease Mother   . Diabetes Mother   . Arthritis Sister   . Hyperlipidemia Sister   . COPD Brother   . Diabetes Sister   . Colon cancer      first cousin   . Breast cancer Maternal Aunt   . Breast cancer Paternal Aunt     Social History:  reports that she has been smoking Cigarettes.  She has been smoking about 0.25 packs per day. She has never used smokeless tobacco. She reports that she drinks alcohol. She reports that she does not use drugs.  She lives in [place].  The patient is alone today.  Allergies:  Allergies  Allergen Reactions  . Codeine Nausea And Vomiting  . Oxycodone Nausea And Vomiting  . Penicillins Nausea And Vomiting  . Tramadol Nausea And Vomiting    Current Medications: Current Outpatient Prescriptions  Medication Sig Dispense Refill  . diphenoxylate-atropine (LOMOTIL) 2.5-0.025 MG tablet Take 1 tablet by mouth 3 (three) times daily as needed for diarrhea or loose stools. 21 tablet 0  . doxycycline (ADOXA) 100 MG tablet Take 1 tablet (100 mg total) by mouth 2 (two) times daily. (Patient not taking: Reported on 03/21/2016) 20 tablet 0  . esomeprazole (NEXIUM) 40 MG capsule Take 1 capsule (40 mg total) by mouth daily at 12 noon. 30 capsule 3  . Ferrous Fumarate 324 (106 FE) MG TABS Take 1 tablet by mouth daily. 30 tablet 3  . levothyroxine (SYNTHROID, LEVOTHROID) 100 MCG tablet TAKE ONE TABLET BY MOUTH ONCE DAILY BEFORE BREAKFAST 30 tablet 6  . meclizine (ANTIVERT) 25 MG tablet  Take 1 tablet (25 mg total) by mouth 4 (four) times daily as needed for dizziness. Nausea or anxiety. (Patient not taking: Reported on 03/21/2016) 30 tablet 3  . metoprolol succinate (TOPROL-XL) 50 MG 24 hr tablet TAKE ONE TABLET BY MOUTH ONCE DAILY 30 tablet 3  . venlafaxine XR (EFFEXOR-XR) 75 MG 24 hr capsule Take 1 capsule (75 mg total) by mouth daily with breakfast. 30 capsule 3  . zolpidem (AMBIEN) 5 MG tablet Take 1 tablet (5 mg total) by mouth at bedtime as needed for sleep. 15 tablet 1   No current facility-administered medications for this visit.     Review of Systems:  GENERAL:  Feels "ok".  No fevers or sweats.  Weight down 3  pounds. PERFORMANCE STATUS (ECOG):  1 HEENT:  No visual changes, runny nose, sore throat, mouth sores or tenderness. Lungs: No shortness of breath or cough.  No hemoptysis. Cardiac:  No chest pain, palpitations, orthopnea, or PND. GI:  Eating better. No nausea, vomiting, diarrhea, constipation, melena or hematochezia. GU:  No urgency, frequency, dysuria, or hematuria. Musculoskeletal:  Intermittent back pain.  Arthritis in hands.  No muscle tenderness. Extremities:  No pain or swelling. Skin:  No rashes or skin changes. Neuro:  Headaches, taking Excedrin.  No numbness or weakness, balance or coordination issues. Endocrine:  No diabetes.  Thyroid disease on Synthroid.  No hot flashes or night sweats. Psych:  No mood changes, depression or anxiety. Pain:  No focal pain. Review of systems:  All other systems reviewed and found   Physical Exam: There were no vitals taken for this visit. GENERAL:  Well developed, well nourished, woman sitting comfortably in the exam room in no acute distress. MENTAL STATUS:  Alert and oriented to person, place and time. HEAD:  Wearing a black cap with sequins.  Short gray hair.  Normocephalic, atraumatic, face symmetric, no Cushingoid features. EYES:  Hazel eyes.  No conjunctivitis or scleral icterus. RESPIRATORY:  Clear to auscultation without rales, wheezes or rhonchi. CARDIOVASCULAR:  Regular rate and rhythm without murmur, rub or gallop. EXTREMITIES: No edema, no skin discoloration or tenderness.  No palpable cords. NEUROLOGICAL: Unremarkable. PSYCH:  Appropriate.   No visits with results within 3 Day(s) from this visit.  Latest known visit with results is:  Orders Only on 03/25/2016  Component Date Value Ref Range Status  . Fecal Occult Bld 03/25/2016 NEGATIVE  NEGATIVE Final  . Fecal Occult Bld 03/25/2016 NEGATIVE  NEGATIVE Final  . Fecal Occult Bld 03/25/2016 NEGATIVE  NEGATIVE Final    Assessment:  Melissa Fitzgerald is a 72 y.o. female with  stage III colon cancer s/p right hemicolectomy on 09/29/2013.  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  There was lymph-vascular invasion.  There was no perineural invasion.  Margins were negative.  One of 14 lymph nodes were negative.  Pathologic stage was T3N1aM0.  She received 12 cycles of adjuvant chemotherapy.  She received 6 cycles of FOLFOX.  FOLFOX was switched to 5FU/LV secondary to a neuropathy.    Abdomen and pelvic CT scan without contrast on 02/07/2014 revealed a distal right ureteral calculus causing high grade obstruction of the right ureter and kidney.  Chest, abdomen, and pelvic CT scan on 02/19/2016 revealed no definite evidence of recurrent or metastatic carcinoma.  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  Recommend followup chest CT in 6 months.  There was blateral nonobstructive  nephrolithiasis and left renal cysts.   Colonoscopy on 10/17/2014 was normal.  EGD on 01/03/2015 revealed diffuse moderate inflammation in the stomach.  Pathology revealed healing erosive gastritis.  There was no H pylori, dysplasia or malignancy.  CEA has been followed:  14.0 on 09/22/2013, 2.9 on 10/17/2013, 1.9 on 04/19/2014, 1.9 on 10/16/2014, 1.9 on 01/16/2015, 1.6 on 07/18/2015, and 1.8 on 02/11/2016.  She has iron deficiency anemia.  Ferritin was 5 with an iron saturation of 7% and a TIBC of 405 on 02/11/2016.  Retic was 1.3%.  Normal labs included a creatinine and folate.  B12 was 238 (low normal) with a normal MMA (151) ruling out B12 deficiency.  Retic was 1.3%.  She received Venofer 200 mg weekly x 4 (02/22/2016 - 03/14/2016).  Hematocrit improved from 28 to 30.8 from 02/11/2016 to 03/21/2016.  Ferritin was 200 on 03/20/2016.  Symptomatically, she is eating better and taking oral iron.  She is taking Excedrin for headaches.  Plan: 1. Review labs from  yesterday and correction of iron deficiency.  Discuss persistent anemia.  Discuss additional labs today.  Check urinalysis to ensure no microscopic hematuria. 2.  Labs today:  ferritin, folate, ANA, TSH, free T4, hemoglobin electrophoresis. 3.  No Venofer today. 4.  Guaiac cards today. 5.  Urinalysis today. 6.  RTC in 2 weeks for MD assess and review of testing.   Review chest, abdomen, and pelvic CT scan.  No evidence of recurrent disease.  Discuss plan for follow-up chest CT in 6 months. 2.  Discuss iron deficiency anemia.  Discuss concern for use of Excedrin and gastritis (previously took BC powders).  Discuss oral iron or IV iron if unable to replete iron stores.  Patient wishes to proceed with IV iron.  Goal ferritin is 100 8.  RTC on 03/21/2016 for MD assessment, labs (CBC with diff, ferritin- day before) +/- Venofer.   Lequita Asal, MD  04/04/2016, 6:22 AM

## 2016-04-08 ENCOUNTER — Other Ambulatory Visit: Payer: Self-pay | Admitting: Family Medicine

## 2016-04-08 ENCOUNTER — Other Ambulatory Visit: Payer: Self-pay | Admitting: Physician Assistant

## 2016-04-08 DIAGNOSIS — F32A Depression, unspecified: Secondary | ICD-10-CM

## 2016-04-08 DIAGNOSIS — F329 Major depressive disorder, single episode, unspecified: Secondary | ICD-10-CM

## 2016-04-08 DIAGNOSIS — I10 Essential (primary) hypertension: Secondary | ICD-10-CM

## 2016-04-08 NOTE — Telephone Encounter (Signed)
Phone in refill of Zolpidem. Will e-prescribe the [employer]

## 2016-04-11 ENCOUNTER — Other Ambulatory Visit: Payer: Self-pay | Admitting: Family Medicine

## 2016-04-11 ENCOUNTER — Other Ambulatory Visit: Payer: Self-pay | Admitting: *Deleted

## 2016-04-11 ENCOUNTER — Inpatient Hospital Stay: Payer: Commercial Managed Care - HMO | Admitting: Hematology and Oncology

## 2016-04-11 NOTE — Progress Notes (Deleted)
[facility] day:  04/11/2016  Chief Complaint: Melissa Fitzgerald is a 71 y.o. female with stage III colon cancer and iron deficiency anemia who is seen for 2 week assessment and review of additional testing.  HPI:  The patient was last seen in the medical oncology clinic on 03/21/2016.  At that time, she had persistent anemia despite improvement in ferritin from 5 to 200.  Hematocrit was 30.8  Additional labs included the following normal studies: folate, SPEP, hemoglobin electrophoresis.  ANA was positive with a positive Ro (1.8).  TSH was 0.367 (low) with a normal T4 total, T3 uptake ratio, and free thyroxine index.  Guaiac cards were negative x 3 in 02/2016.  Urinalysis revealed no hematuria.  Symptomatically,   Past Medical History:  Diagnosis Date  . Anemia   . Colon polyp   . Hyperlipidemia   . Hypertension   . Irritable bowel disease   . Kidney stone   . Thyroid disease     Past Surgical History:  Procedure Laterality Date  . ABDOMINAL HYSTERECTOMY    . BREAST ENHANCEMENT SURGERY    . CHOLECYSTECTOMY  2000  . COLECTOMY  09/2013  . COLONOSCOPY  09-13-13   Dr [name]  . COLONOSCOPY WITH PROPOFOL N/A 10/18/2014   Procedure: COLONOSCOPY WITH PROPOFOL;  Surgeon: [name], MD;  Location: [facility];  Service: Endoscopy;  Laterality: N/A;  . ESOPHAGOGASTRODUODENOSCOPY (EGD) WITH PROPOFOL N/A 01/03/2015   Procedure: ESOPHAGOGASTRODUODENOSCOPY (EGD) WITH PROPOFOL;  Surgeon: [name], MD;  Location: [facility];  Service: Endoscopy;  Laterality: N/A;  . FOOT SURGERY    . KIDNEY STONE SURGERY    . NASAL SINUS SURGERY    . PORTACATH PLACEMENT  10/24/13  . THYROID SURGERY    . TONSILLECTOMY      Family History  Problem Relation Age of Onset  . Heart disease Mother   . Diabetes Mother   . Arthritis Sister   . Hyperlipidemia Sister   . COPD Brother   . Diabetes Sister   . Colon cancer      first cousin   . Breast cancer Maternal Aunt   . Breast cancer Paternal Aunt     Social History:  reports that she has been smoking Cigarettes.  She has been smoking about 0.25 packs per day. She has never used smokeless tobacco. She reports that she drinks alcohol. She reports that she does not use drugs.  She lives in [place].  The patient is alone today.  Allergies:  Allergies  Allergen Reactions  . Codeine Nausea And Vomiting  . Oxycodone Nausea And Vomiting  . Penicillins Nausea And Vomiting  . Tramadol Nausea And Vomiting    Current Medications: Current Outpatient Prescriptions  Medication Sig Dispense Refill  . diphenoxylate-atropine (LOMOTIL) 2.5-0.025 MG tablet Take 1 tablet by mouth 3 (three) times daily as needed for diarrhea or loose stools. 21 tablet 0  . doxycycline (ADOXA) 100 MG tablet Take 1 tablet (100 mg total) by mouth 2 (two) times daily. (Patient not taking: Reported on 03/21/2016) 20 tablet 0  . esomeprazole (NEXIUM) 40 MG capsule Take 1 capsule (40 mg total) by mouth daily at 12 noon. 30 capsule 3  . Ferrous Fumarate 324 (106 FE) MG TABS Take 1 tablet by mouth daily. 30 tablet 3  . levothyroxine (SYNTHROID, LEVOTHROID) 100 MCG tablet TAKE ONE TABLET BY MOUTH ONCE DAILY BEFORE BREAKFAST 30 tablet 6  . meclizine (ANTIVERT) 25 MG tablet  Take 1 tablet (25 mg total) by mouth 4 (four) times daily as needed for dizziness. Nausea or anxiety. (Patient not taking: Reported on 03/21/2016) 30 tablet 3  . metoprolol succinate (TOPROL-XL) 50 MG 24 hr tablet TAKE ONE TABLET BY MOUTH ONCE DAILY 30 tablet 3  . venlafaxine XR (EFFEXOR-XR) 75 MG 24 hr capsule TAKE ONE CAPSULE BY MOUTH ONCE DAILY WITH BREAKFAST 30 capsule 3  . zolpidem (AMBIEN) 5 MG tablet TAKE ONE TABLET BY MOUTH AT BEDTIME AS NEEDED FOR SLEEP 15 tablet 1   No current facility-administered medications for this visit.     Review of Systems:  GENERAL:  Feels "ok".  No fevers or sweats.  Weight down 3 pounds. PERFORMANCE  STATUS (ECOG):  1 HEENT:  No visual changes, runny nose, sore throat, mouth sores or tenderness. Lungs: No shortness of breath or cough.  No hemoptysis. Cardiac:  No chest pain, palpitations, orthopnea, or PND. GI:  Eating better. No nausea, vomiting, diarrhea, constipation, melena or hematochezia. GU:  No urgency, frequency, dysuria, or hematuria. Musculoskeletal:  Intermittent back pain.  Arthritis in hands.  No muscle tenderness. Extremities:  No pain or swelling. Skin:  No rashes or skin changes. Neuro:  Headaches, taking Excedrin.  No numbness or weakness, balance or coordination issues. Endocrine:  No diabetes.  Thyroid disease on Synthroid.  No hot flashes or night sweats. Psych:  No mood changes, depression or anxiety. Pain:  No focal pain. Review of systems:  All other systems reviewed and found   Physical Exam: There were no vitals taken for this visit. GENERAL:  Well developed, well nourished, woman sitting comfortably in the exam room in no acute distress. MENTAL STATUS:  Alert and oriented to person, place and time. HEAD:  Wearing a black cap with sequins.  Short gray hair.  Normocephalic, atraumatic, face symmetric, no Cushingoid features. EYES:  Hazel eyes.  No conjunctivitis or scleral icterus. RESPIRATORY:  Clear to auscultation without rales, wheezes or rhonchi. CARDIOVASCULAR:  Regular rate and rhythm without murmur, rub or gallop. EXTREMITIES: No edema, no skin discoloration or tenderness.  No palpable cords. NEUROLOGICAL: Unremarkable. PSYCH:  Appropriate.   No visits with results within 3 Day(s) from this visit.  Latest known visit with results is:  Orders Only on 03/25/2016  Component Date Value Ref Range Status  . Fecal Occult Bld 03/21/2016 NEGATIVE  NEGATIVE Final  . Fecal Occult Bld 03/22/2016 NEGATIVE  NEGATIVE Final  . Fecal Occult Bld 03/23/2016 NEGATIVE  NEGATIVE Final    Assessment:  Melissa Fitzgerald is a 71 y.o. female with stage III colon cancer  s/p right hemicolectomy on 09/29/2013.  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  There was lymph-vascular invasion.  There was no perineural invasion.  Margins were negative.  One of 14 lymph nodes were negative.  Pathologic stage was T3N1aM0.  She received 12 cycles of adjuvant chemotherapy.  She received 6 cycles of FOLFOX.  FOLFOX was switched to 5FU/LV secondary to a neuropathy.    Abdomen and pelvic CT scan without contrast on 02/07/2014 revealed a distal right ureteral calculus causing high grade obstruction of the right ureter and kidney.  Chest, abdomen, and pelvic CT scan on 02/19/2016 revealed no definite evidence of recurrent or metastatic carcinoma.  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  Recommend followup chest CT in 6 months.  There was blateral nonobstructive nephrolithiasis and left renal cysts.  Colonoscopy on 10/17/2014 was normal.  EGD on 01/03/2015 revealed diffuse moderate inflammation in the stomach.  Pathology revealed healing erosive gastritis.  There was no H pylori, dysplasia or malignancy.  CEA has been followed:  14.0 on 09/22/2013, 2.9 on 10/17/2013, 1.9 on 04/19/2014, 1.9 on 10/16/2014, 1.9 on 01/16/2015, 1.6 on 07/18/2015, and 1.8 on 02/11/2016.  She has iron deficiency anemia.  Ferritin was 5 with an iron saturation of 7% and a TIBC of 405 on 02/11/2016.  Retic was 1.3%.  Normal labs included a creatinine and folate.  B12 was 238 (low normal) with a normal MMA (151) ruling out B12 deficiency.  Retic was 1.3%.  She received Venofer 200 mg weekly x 4 (02/22/2016 - 03/14/2016).  Hematocrit improved from 28 to 30.8 from 02/11/2016 to 03/21/2016.  Ferritin was 200 on 03/20/2016.  Symptomatically, she is eating better and taking oral iron.  She is taking Excedrin for headaches.  Plan: 1. Review labs from yesterday and  correction of iron deficiency.  Discuss persistent anemia.  Discuss additional labs today.  Check urinalysis to ensure no microscopic hematuria. 2.  Labs today:  ferritin, folate, ANA, TSH, free T4, hemoglobin electrophoresis. 3.  No Venofer today. 4.  Guaiac cards today. 5.  Urinalysis today. 6.  RTC in 2 weeks for MD assess and review of testing.   Review chest, abdomen, and pelvic CT scan.  No evidence of recurrent disease.  Discuss plan for follow-up chest CT in 6 months. 2.  Discuss iron deficiency anemia.  Discuss concern for use of Excedrin and gastritis (previously took BC powders).  Discuss oral iron or IV iron if unable to replete iron stores.  Patient wishes to proceed with IV iron.  Goal ferritin is 100 8.  RTC on 03/21/2016 for MD assessment, labs (CBC with diff, ferritin- day before) +/- Venofer.   Lequita Asal, MD  04/11/2016, 5:24 AM

## 2016-04-11 NOTE — Telephone Encounter (Signed)
RX called in at [facility] pharmacy  

## 2016-04-11 NOTE — Telephone Encounter (Signed)
Be sure [facility] received this refill. Authorized phone in on 04-08-16.

## 2016-04-14 NOTE — Telephone Encounter (Signed)
[facility] did receive refill that was called in on 04/08/2016.

## 2016-04-16 ENCOUNTER — Ambulatory Visit: Payer: Commercial Managed Care - HMO

## 2016-04-17 ENCOUNTER — Telehealth: Payer: Self-pay | Admitting: *Deleted

## 2016-04-17 NOTE — Telephone Encounter (Signed)
Called patient and informed her about her appt for 04-28-16 voiced understanding.

## 2016-04-21 ENCOUNTER — Ambulatory Visit: Payer: Commercial Managed Care - HMO | Admitting: Hematology and Oncology

## 2016-04-28 ENCOUNTER — Inpatient Hospital Stay: Payer: Medicare HMO | Attending: Hematology and Oncology | Admitting: Hematology and Oncology

## 2016-04-28 VITALS — BP 158/98 | HR 68 | Temp 96.7°F | Resp 18 | Wt 175.3 lb

## 2016-04-28 DIAGNOSIS — F17210 Nicotine dependence, cigarettes, uncomplicated: Secondary | ICD-10-CM | POA: Insufficient documentation

## 2016-04-28 DIAGNOSIS — Z86010 Personal history of colonic polyps: Secondary | ICD-10-CM | POA: Insufficient documentation

## 2016-04-28 DIAGNOSIS — E785 Hyperlipidemia, unspecified: Secondary | ICD-10-CM | POA: Insufficient documentation

## 2016-04-28 DIAGNOSIS — R768 Other specified abnormal immunological findings in serum: Secondary | ICD-10-CM

## 2016-04-28 DIAGNOSIS — K589 Irritable bowel syndrome without diarrhea: Secondary | ICD-10-CM | POA: Insufficient documentation

## 2016-04-28 DIAGNOSIS — I10 Essential (primary) hypertension: Secondary | ICD-10-CM | POA: Insufficient documentation

## 2016-04-28 DIAGNOSIS — N281 Cyst of kidney, acquired: Secondary | ICD-10-CM | POA: Insufficient documentation

## 2016-04-28 DIAGNOSIS — D509 Iron deficiency anemia, unspecified: Secondary | ICD-10-CM | POA: Insufficient documentation

## 2016-04-28 DIAGNOSIS — Z79899 Other long term (current) drug therapy: Secondary | ICD-10-CM | POA: Diagnosis not present

## 2016-04-28 DIAGNOSIS — C189 Malignant neoplasm of colon, unspecified: Secondary | ICD-10-CM | POA: Diagnosis not present

## 2016-04-28 DIAGNOSIS — M1990 Unspecified osteoarthritis, unspecified site: Secondary | ICD-10-CM | POA: Insufficient documentation

## 2016-04-28 DIAGNOSIS — G629 Polyneuropathy, unspecified: Secondary | ICD-10-CM

## 2016-04-28 NOTE — Progress Notes (Signed)
[facility] day:  04/28/2016  Chief Complaint: Melissa Fitzgerald is a 71 y.o. female with stage III colon cancer and iron deficiency anemia who is seen for 1 month assessment and review of additional testing.  HPI:  The patient was last seen in the medical oncology clinic on 03/21/2016.  At that time, she had persistent anemia despite improvement in ferritin from 5 to 200.  Hematocrit was 30.8  Additional labs included the following normal studies: folate, SPEP, hemoglobin electrophoresis.  ANA was positive with a positive Ro (1.8).  TSH was 0.367 (low) with a normal T4 total, T3 uptake ratio, and free thyroxine index.  Guaiac cards were negative x 3 in 02/2016.  Urinalysis revealed no hematuria.  Symptomatically, she describes being in bed because her back hurts. 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  She denies any bleeding.   Past Medical History:  Diagnosis Date  . Anemia   . Colon polyp   . Hyperlipidemia   . Hypertension   . Irritable bowel disease   . Kidney stone   . Thyroid disease     Past Surgical History:  Procedure Laterality Date  . ABDOMINAL HYSTERECTOMY    . BREAST ENHANCEMENT SURGERY    . CHOLECYSTECTOMY  2000  . COLECTOMY  09/2013  . COLONOSCOPY  09-13-13   Dr [name]  . COLONOSCOPY WITH PROPOFOL N/A 10/18/2014   Procedure: COLONOSCOPY WITH PROPOFOL;  Surgeon: [name], MD;  Location: [facility] ENDOSCOPY;  Service: Endoscopy;  Laterality: N/A;  . ESOPHAGOGASTRODUODENOSCOPY (EGD) WITH PROPOFOL N/A 01/03/2015   Procedure: ESOPHAGOGASTRODUODENOSCOPY (EGD) WITH PROPOFOL;  Surgeon: [name], MD;  Location: [facility] ENDOSCOPY;  Service: Endoscopy;  Laterality: N/A;  . FOOT SURGERY    . KIDNEY STONE SURGERY    . NASAL SINUS SURGERY    . PORTACATH PLACEMENT  10/24/13  . THYROID SURGERY    . TONSILLECTOMY      Family History  Problem Relation Age of Onset  . Heart disease Mother   . Diabetes Mother   .  Arthritis Sister   . Hyperlipidemia Sister   . COPD Brother   . Diabetes Sister   . Colon cancer      first cousin  . Breast cancer Maternal Aunt   . Breast cancer Paternal Aunt     Social History:  reports that she has been smoking Cigarettes.  She has been smoking about 0.25 packs per day. She has never used smokeless tobacco. She reports that she drinks alcohol. She reports that she does not use drugs.  She lives in [place].  The patient is alone today.  Allergies:  Allergies  Allergen Reactions  . Codeine Nausea And Vomiting  . Oxycodone Nausea And Vomiting  . Penicillins Nausea And Vomiting  . Tramadol Nausea And Vomiting    Current Medications: Current Outpatient Prescriptions  Medication Sig Dispense Refill  . diphenoxylate-atropine (LOMOTIL) 2.5-0.025 MG tablet Take 1 tablet by mouth 3 (three) times daily as needed for diarrhea or loose stools. 21 tablet 0  . esomeprazole (NEXIUM) 40 MG capsule Take 1 capsule (40 mg total) by mouth daily at 12 noon. 30 capsule 3  . Ferrous Fumarate 324 (106 FE) MG TABS Take 1 tablet by mouth daily. 30 tablet 3  . levothyroxine (SYNTHROID, LEVOTHROID) 100 MCG tablet TAKE ONE TABLET BY MOUTH ONCE DAILY BEFORE BREAKFAST 30 tablet 6  . meclizine (ANTIVERT) 25 MG tablet Take 1 tablet (25 mg total) by mouth  4 (four) times daily as needed for dizziness. Nausea or anxiety. 30 tablet 3  . metoprolol succinate (TOPROL-XL) 50 MG 24 hr tablet TAKE ONE TABLET BY MOUTH ONCE DAILY 90 tablet 3  . venlafaxine XR (EFFEXOR-XR) 75 MG 24 hr capsule TAKE ONE CAPSULE BY MOUTH ONCE DAILY WITH BREAKFAST 30 capsule 3  . zolpidem (AMBIEN) 5 MG tablet TAKE ONE TABLET BY MOUTH AT BEDTIME AS NEEDED FOR SLEEP 15 tablet 1  . doxycycline (ADOXA) 100 MG tablet Take 1 tablet (100 mg total) by mouth 2 (two) times daily. (Patient not taking: Reported on 03/21/2016) 20 tablet 0   No current facility-administered medications for this visit.     Review of Systems:  GENERAL:   Feels "ok".  No fevers or sweats.  Weight down 1 pound. PERFORMANCE STATUS (ECOG):  1 HEENT:  Post nasal drip all year long.  No visual changes, sore throat, mouth sores or tenderness. Lungs: No shortness of breath or cough.  No hemoptysis. Cardiac:  No chest pain, palpitations, orthopnea, or PND. GI:  Eating better. No nausea, vomiting, diarrhea, constipation, melena or hematochezia. GU:  No urgency, frequency, dysuria, or hematuria. Musculoskeletal: Back hurts.  Arthritis in hands.  Pain in thumbs stopped.  No muscle tenderness. Extremities:  No pain or swelling. Skin:  No rashes or skin changes. Neuro:  Headaches, taking Excedrin.  No numbness or weakness, balance or coordination issues. Endocrine:  No diabetes.  Thyroid disease on Synthroid.  No hot flashes or night sweats. Psych:  No mood changes, depression or anxiety.  Moody. Pain:  No focal pain. Review of systems:  All other systems reviewed and found   Physical Exam: Blood pressure (!) 158/98, pulse 68, temperature (!) 96.7 F (35.9 C), temperature source Tympanic, resp. rate 18, weight 175 lb 4.3 oz (79.5 kg). GENERAL:  Well developed, well nourished, woman sitting comfortably in the exam room in no acute distress. MENTAL STATUS:  Alert and oriented to person, place and time. HEAD:  Curly gray hair.  Normocephalic, atraumatic, face symmetric, no Cushingoid features. ENT:  Oropharynx clear without lesion.  Tongue normal. Mucous membranes moist.  NECK:  Supple without adenopathy. RESPIRATORY:  Clear to auscultation without rales, wheezes or rhonchi. CARDIOVASCULAR:  Regular rate and rhythm without murmur, rub or gallop. ABDOMEN:  Soft, non-tender, with active bowel sounds, and no hepatosplenomegaly.  No masses. SKIN:  No rashes, ulcers or lesions. EXTREMITIES: No edema, no skin discoloration or tenderness.  No palpable cords. LYMPH NODES: No palpable cervical, supraclavicular, axillary or inguinal adenopathy  NEUROLOGICAL:  Unremarkable. PSYCH:  Appropriate.   No visits with results within 3 Day(s) from this visit.  Latest known visit with results is:  Orders Only on 03/25/2016  Component Date Value Ref Range Status  . Fecal Occult Bld 03/21/2016 NEGATIVE  NEGATIVE Final  . Fecal Occult Bld 03/22/2016 NEGATIVE  NEGATIVE Final  . Fecal Occult Bld 03/23/2016 NEGATIVE  NEGATIVE Final    Assessment:  Melissa Fitzgerald is a 71 y.o. female with stage III colon cancer s/p right hemicolectomy on 09/29/2013.  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  There was lymph-vascular invasion.  There was no perineural invasion.  Margins were negative.  One of 14 lymph nodes were negative.  Pathologic stage was T3N1aM0.  She received 12 cycles of adjuvant chemotherapy.  She received 6 cycles of FOLFOX.  FOLFOX was switched to 5FU/LV secondary to a neuropathy.    Abdomen and pelvic  CT scan without contrast on 02/07/2014 revealed a distal right ureteral calculus causing high grade obstruction of the right ureter and kidney.  Chest, abdomen, and pelvic CT scan on 02/19/2016 revealed no definite evidence of recurrent or metastatic carcinoma.  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  Recommend followup chest CT in 6 months.  There was bilateral nonobstructive nephrolithiasis and left renal cysts.   Colonoscopy on 10/17/2014 was normal.  EGD on 01/03/2015 revealed diffuse moderate inflammation in the stomach.  Pathology revealed healing erosive gastritis.  There was no H pylori, dysplasia or malignancy.  CEA has been followed:  14.0 on 09/22/2013, 2.9 on 10/17/2013, 1.9 on 04/19/2014, 1.9 on 10/16/2014, 1.9 on 01/16/2015, 1.6 on 07/18/2015, and 1.8 on 02/11/2016.  She has a history of iron deficiency anemia.  Ferritin was 5 with an iron saturation of 7% and a TIBC of 405 on 02/11/2016.  Retic was  1.3%.  Normal labs included a creatinine and folate.  B12 was 238 (low normal) with a normal MMA (151) ruling out B12 deficiency.  Retic was 1.3%.  Guaiac cards x 3 were negative in 03/2016.  Urinalysis revealed no hematuria.  She received Venofer 200 mg weekly x 4 (02/22/2016 - 03/14/2016).  Hematocrit improved from 28 to 30.8 from 02/11/2016 to 03/21/2016.  Ferritin was 200 on 03/20/2016.  Additional labs on 03/21/2016 included a normal folate, SPEP, and hemoglobin electrophoresis.  ANA was positive with a positive Ro (1.8).  TSH was 0.367 (low) with a normal T4 total, T3 uptake ratio, and free thyroxine index.  Symptomatically, she is eating well.  She has back pain and pain due to arthritis in her hands.  She is having difficulty gaining weight.  Plan: 1.  Review labs from workup and correction of iron deficiency.  Discuss persistent anemia.  Additional labs with positive ANA.  Discuss referral to rheumatology. 2.  Consult rheumatology re:  + ANA (positive Ro) with arthritis and persistent anemia with normal iron stores. 3.  RTC in 4 months for MD assess and labs (CBC with diff, CMP, CEA, ferritin).   Lequita Asal, MD  04/28/2016, 4:03 PM

## 2016-04-28 NOTE — Progress Notes (Signed)
Patient states she had a virus about a week ago.  She is feeling much better.  She also states her diarrhea is much better since last visit.  She is here today to get Guiac results. Patient states she has been moody lately.  BP elevated today 158/98 HR 68.  States she took her BP meds today.

## 2016-05-01 ENCOUNTER — Ambulatory Visit: Payer: Commercial Managed Care - HMO

## 2016-05-08 ENCOUNTER — Other Ambulatory Visit: Payer: Self-pay | Admitting: Family Medicine

## 2016-05-08 DIAGNOSIS — K591 Functional diarrhea: Secondary | ICD-10-CM

## 2016-05-09 ENCOUNTER — Other Ambulatory Visit: Payer: Self-pay | Admitting: Family Medicine

## 2016-05-09 DIAGNOSIS — K591 Functional diarrhea: Secondary | ICD-10-CM

## 2016-05-09 NOTE — Telephone Encounter (Signed)
RX called in at [facility] pharmacy  

## 2016-05-09 NOTE — Telephone Encounter (Signed)
Please phone in refill to her pharmacy

## 2016-05-12 ENCOUNTER — Encounter: Payer: Self-pay | Admitting: Hematology and Oncology

## 2016-06-03 ENCOUNTER — Ambulatory Visit: Payer: Self-pay

## 2016-06-05 ENCOUNTER — Ambulatory Visit: Payer: Self-pay

## 2016-06-06 ENCOUNTER — Ambulatory Visit: Payer: Medicare HMO

## 2016-06-08 ENCOUNTER — Other Ambulatory Visit: Payer: Self-pay | Admitting: Family Medicine

## 2016-06-09 ENCOUNTER — Other Ambulatory Visit: Payer: Self-pay | Admitting: Family Medicine

## 2016-06-09 NOTE — Telephone Encounter (Signed)
RX called in at [facility] pharmacy  

## 2016-06-09 NOTE — Telephone Encounter (Signed)
Phone in refill of Zolpidem as authorized in medication list.

## 2016-06-18 ENCOUNTER — Ambulatory Visit (INDEPENDENT_AMBULATORY_CARE_PROVIDER_SITE_OTHER): Payer: Medicare HMO

## 2016-06-18 VITALS — BP 150/90 | HR 68 | Temp 98.1°F | Ht 66.0 in | Wt 179.2 lb

## 2016-06-18 DIAGNOSIS — Z23 Encounter for immunization: Secondary | ICD-10-CM | POA: Diagnosis not present

## 2016-06-18 DIAGNOSIS — Z0000 Encounter for general adult medical examination without abnormal findings: Secondary | ICD-10-CM | POA: Diagnosis not present

## 2016-06-18 NOTE — Patient Instructions (Signed)
Melissa Fitzgerald , Thank you for taking time to come for your Medicare Wellness Visit. I appreciate your ongoing commitment to your health goals. Please review the following plan we discussed and let me know if I can assist you in the future.   Screening recommendations/referrals: Colonoscopy: done 03/25/16 Mammogram: pt to call and schedule Bone Density: declined Recommended yearly ophthalmology/optometry visit for glaucoma screening and checkup Recommended yearly dental visit for hygiene and checkup  Vaccinations: Influenza vaccine: declined Pneumococcal vaccine: Prevnar13 given today, will be due for Pneumovax in 1 year. Tdap vaccine: patient to bring immunization record to show she is up to date. Shingles vaccine: 09/30/11   Advanced directives: Given packet today, requested copy  Next appointment: None, recommended follow up with Dr Caryn Section.   Preventive Care 68 Years and Older, Female Preventive care refers to lifestyle choices and visits with your health care provider that can promote health and wellness. What does preventive care include?  A yearly physical exam. This is also called an annual well check.  Dental exams once or twice a year.  Routine eye exams. Ask your health care provider how often you should have your eyes checked.  Personal lifestyle choices, including:  Daily care of your teeth and gums.  Regular physical activity.  Eating a healthy diet.  Avoiding tobacco and drug use.  Limiting alcohol use.  Practicing safe sex.  Taking low-dose aspirin every day.  Taking vitamin and mineral supplements as recommended by your health care provider. What happens during an annual well check? The services and screenings done by your health care provider during your annual well check will depend on your age, overall health, lifestyle risk factors, and family history of disease. Counseling  Your health care provider may ask you questions about your:  Alcohol  use.  Tobacco use.  Drug use.  Emotional well-being.  Home and relationship well-being.  Sexual activity.  Eating habits.  History of falls.  Memory and ability to understand (cognition).  Work and work Statistician.  Reproductive health. Screening  You may have the following tests or measurements:  Height, weight, and BMI.  Blood pressure.  Lipid and cholesterol levels. These may be checked every 5 years, or more frequently if you are over 29 years old.  Skin check.  Lung cancer screening. You may have this screening every year starting at age 11 if you have a 30-pack-year history of smoking and currently smoke or have quit within the past 15 years.  Fecal occult blood test (FOBT) of the stool. You may have this test every year starting at age 62.  Flexible sigmoidoscopy or colonoscopy. You may have a sigmoidoscopy every 5 years or a colonoscopy every 10 years starting at age 44.  Hepatitis C blood test.  Hepatitis B blood test.  Sexually transmitted disease (STD) testing.  Diabetes screening. This is done by checking your blood sugar (glucose) after you have not eaten for a while (fasting). You may have this done every 1-3 years.  Bone density scan. This is done to screen for osteoporosis. You may have this done starting at age 42.  Mammogram. This may be done every 1-2 years. Talk to your health care provider about how often you should have regular mammograms. Talk with your health care provider about your test results, treatment options, and if necessary, the need for more tests. Vaccines  Your health care provider may recommend certain vaccines, such as:  Influenza vaccine. This is recommended every year.  Tetanus, diphtheria, and  acellular pertussis (Tdap, Td) vaccine. You may need a Td booster every 10 years.  Zoster vaccine. You may need this after age 6.  Pneumococcal 13-valent conjugate (PCV13) vaccine. One dose is recommended after age  26.  Pneumococcal polysaccharide (PPSV23) vaccine. One dose is recommended after age 52. Talk to your health care provider about which screenings and vaccines you need and how often you need them. This information is not intended to replace advice given to you by your health care provider. Make sure you discuss any questions you have with your health care provider. Document Released: 04/06/2015 Document Revised: 11/28/2015 Document Reviewed: 01/09/2015 Elsevier Interactive Patient Education  2017 [place] Prevention in the Home Falls can cause injuries. They can happen to people of all ages. There are many things you can do to make your home safe and to help prevent falls. What can I do on the outside of my home?  Regularly fix the edges of walkways and driveways and fix any cracks.  Remove anything that might make you trip as you walk through a door, such as a raised step or threshold.  Trim any bushes or trees on the path to your home.  Use bright outdoor lighting.  Clear any walking paths of anything that might make someone trip, such as rocks or tools.  Regularly check to see if handrails are loose or broken. Make sure that both sides of any steps have handrails.  Any raised decks and porches should have guardrails on the edges.  Have any leaves, snow, or ice cleared regularly.  Use sand or salt on walking paths during winter.  Clean up any spills in your garage right away. This includes oil or grease spills. What can I do in the bathroom?  Use night lights.  Install grab bars by the toilet and in the tub and shower. Do not use towel bars as grab bars.  Use non-skid mats or decals in the tub or shower.  If you need to sit down in the shower, use a plastic, non-slip stool.  Keep the floor dry. Clean up any water that spills on the floor as soon as it happens.  Remove soap buildup in the tub or shower regularly.  Attach bath mats securely with double-sided  non-slip rug tape.  Do not have throw rugs and other things on the floor that can make you trip. What can I do in the bedroom?  Use night lights.  Make sure that you have a light by your bed that is easy to reach.  Do not use any sheets or blankets that are too big for your bed. They should not hang down onto the floor.  Have a firm chair that has side arms. You can use this for support while you get dressed.  Do not have throw rugs and other things on the floor that can make you trip. What can I do in the kitchen?  Clean up any spills right away.  Avoid walking on wet floors.  Keep items that you use a lot in easy-to-reach places.  If you need to reach something above you, use a strong step stool that has a grab bar.  Keep electrical cords out of the way.  Do not use floor polish or wax that makes floors slippery. If you must use wax, use non-skid floor wax.  Do not have throw rugs and other things on the floor that can make you trip. What can I do with my stairs?  Do not leave any items on the stairs.  Make sure that there are handrails on both sides of the stairs and use them. Fix handrails that are broken or loose. Make sure that handrails are as long as the stairways.  Check any carpeting to make sure that it is firmly attached to the stairs. Fix any carpet that is loose or worn.  Avoid having throw rugs at the top or bottom of the stairs. If you do have throw rugs, attach them to the floor with carpet tape.  Make sure that you have a light switch at the top of the stairs and the bottom of the stairs. If you do not have them, ask someone to add them for you. What else can I do to help prevent falls?  Wear shoes that:  Do not have high heels.  Have rubber bottoms.  Are comfortable and fit you well.  Are closed at the toe. Do not wear sandals.  If you use a stepladder:  Make sure that it is fully opened. Do not climb a closed stepladder.  Make sure that both  sides of the stepladder are locked into place.  Ask someone to hold it for you, if possible.  Clearly mark and make sure that you can see:  Any grab bars or handrails.  First and last steps.  Where the edge of each step is.  Use tools that help you move around (mobility aids) if they are needed. These include:  Canes.  Walkers.  Scooters.  Crutches.  Turn on the lights when you go into a dark area. Replace any light bulbs as soon as they burn out.  Set up your furniture so you have a clear path. Avoid moving your furniture around.  If any of your floors are uneven, fix them.  If there are any pets around you, be aware of where they are.  Review your medicines with your doctor. Some medicines can make you feel dizzy. This can increase your chance of falling. Ask your doctor what other things that you can do to help prevent falls. This information is not intended to replace advice given to you by your health care provider. Make sure you discuss any questions you have with your health care provider. Document Released: 01/04/2009 Document Revised: 08/16/2015 Document Reviewed: 04/14/2014 Elsevier Interactive Patient Education  2017 [employer].

## 2016-06-18 NOTE — Progress Notes (Signed)
Subjective:   Melissa Fitzgerald is a 70 y.o. female who presents for Medicare Annual (Subsequent) preventive examination.  Review of Systems:  N/A Cardiac Risk Factors include: advanced age (>106men, >78 women);hypertension;dyslipidemia;smoking/ tobacco exposure     Objective:     Vitals: BP (!) 150/90 (BP Location: Right Arm)   Pulse 68   Temp 98.1 F (36.7 C) (Oral)   Ht 5\' 6"  (1.676 m)   Wt 179 lb 3.2 oz (81.3 kg)   BMI 28.92 kg/m   Body mass index is 28.92 kg/m.   Tobacco History  Smoking Status  . Current Some Day Smoker  . Packs/day: 0.00  . Types: Cigarettes  Smokeless Tobacco  . Never Used    Comment: still smoking every now and then, rarely one     Ready to quit: Not Answered Counseling given: Not Answered   Past Medical History:  Diagnosis Date  . Anemia   . Colon polyp   . Depression   . Hyperlipidemia   . Hypertension   . Irritable bowel disease   . Kidney stone   . Thyroid disease    Past Surgical History:  Procedure Laterality Date  . ABDOMINAL HYSTERECTOMY    . BREAST ENHANCEMENT SURGERY    . CHOLECYSTECTOMY  2000  . COLECTOMY  09/2013  . COLONOSCOPY  09-13-13   Dr [name]  . COLONOSCOPY WITH PROPOFOL N/A 10/18/2014   Procedure: COLONOSCOPY WITH PROPOFOL;  Surgeon: [name], MD;  Location: [facility]COPY;  Service: Endoscopy;  Laterality: N/A;  . ESOPHAGOGASTRODUODENOSCOPY (EGD) WITH PROPOFOL N/A 01/03/2015   Procedure: ESOPHAGOGASTRODUODENOSCOPY (EGD) WITH PROPOFOL;  Surgeon: [name], MD;  Location: [facility] ENDOSCOPY;  Service: Endoscopy;  Laterality: N/A;  . FOOT SURGERY    . KIDNEY STONE SURGERY    . NASAL SINUS SURGERY    . PORTACATH PLACEMENT  10/24/13  . THYROID SURGERY    . TONSILLECTOMY     Family History  Problem Relation Age of Onset  . Heart disease Mother   . Diabetes Mother   . Arthritis Sister   . Hyperlipidemia Sister   . COPD Brother   . Diabetes Sister   . Colon cancer      first cousin  .  Breast cancer Maternal Aunt   . Breast cancer Paternal Aunt    History  Sexual Activity  . Sexual activity: Not on file    Outpatient Encounter Prescriptions as of 06/18/2016  Medication Sig  . carboxymethylcellulose (REFRESH PLUS) 0.5 % SOLN 1 drop daily as needed.  . diphenoxylate-atropine (LOMOTIL) 2.5-0.025 MG tablet TAKE ONE TABLET BY MOUTH THREE TIMES DAILY AS NEEDED FOR DIARRHEA OR LOOSE STOOLS  . Ferrous Fumarate 324 (106 FE) MG TABS Take 1 tablet by mouth daily. (Patient taking differently: Take 1 tablet by mouth daily. )  . levothyroxine (SYNTHROID, LEVOTHROID) 100 MCG tablet TAKE ONE TABLET BY MOUTH ONCE DAILY BEFORE BREAKFAST  . meclizine (ANTIVERT) 25 MG tablet Take 1 tablet (25 mg total) by mouth 4 (four) times daily as needed for dizziness. Nausea or anxiety.  . metoprolol succinate (TOPROL-XL) 50 MG 24 hr tablet TAKE ONE TABLET BY MOUTH ONCE DAILY  . venlafaxine XR (EFFEXOR-XR) 75 MG 24 hr capsule TAKE ONE CAPSULE BY MOUTH ONCE DAILY WITH BREAKFAST  . zolpidem (AMBIEN) 5 MG tablet TAKE ONE TABLET BY MOUTH AT BEDTIME AS NEEDED FOR SLEEP  . doxycycline (ADOXA) 100 MG tablet Take 1 tablet (100 mg total) by mouth 2 (two) times daily. (Patient  not taking: Reported on 03/21/2016)  . esomeprazole (NEXIUM) 40 MG capsule Take 1 capsule (40 mg total) by mouth daily at 12 noon. (Patient not taking: Reported on 06/18/2016)   No facility-administered encounter medications on file as of 06/18/2016.     Activities of Daily Living In your present state of health, do you have any difficulty performing the following activities: 06/18/2016  Hearing? N  Vision? N  Difficulty concentrating or making decisions? Y  Walking or climbing stairs? Y  Dressing or bathing? N  Doing errands, shopping? Y  Preparing Food and eating ? N  Using the Toilet? N  In the past six months, have you accidently leaked urine? N  Do you have problems with loss of bowel control? Y  Managing your Medications? N    Managing your Finances? N  Housekeeping or managing your Housekeeping? N  Some recent data might be hidden    Patient Care Team: [name], PA as PCP - General (Physician Assistant) Lollie Sails, MD as Referring Physician (Internal Medicine) Lequita Asal, MD as Referring Physician (Hematology and Oncology) Odette Fraction as Consulting Physician (Optometry)    Assessment:    Exercise Activities and Dietary recommendations Current Exercise Habits: The patient does not participate in regular exercise at present, Exercise limited by: None identified  Goals    . Exercise           Recommend starting to exercise. Pt is going to start going to the gym 3 days a week for 1 hour.      Fall Risk Fall Risk  06/18/2016 03/14/2016 01/04/2015 10/10/2014  Falls in the past year? Yes No Yes No  Number falls in past yr: 1 - 1 -  Injury with Fall? No - No -  Risk for fall due to : - - Impaired balance/gait;Medication side effect -  Risk for fall due to (comments): - - this started with the chemotherapy. The last treatment was in Jan 2016 -  Follow up Falls prevention discussed - - -   Depression Screen PHQ 2/9 Scores 06/18/2016 06/18/2016 01/04/2015 10/10/2014  PHQ - 2 Score 6 6 1 3   PHQ- 9 Score 22 22 - 6  Exception Documentation - - - Medical reason     Cognitive Function     6CIT Screen 06/18/2016  What Year? 0 points  What month? 3 points  What time? 0 points  Count back from 20 0 points  Months in reverse 0 points  Repeat phrase 8 points  Total Score 11    Immunization History  Administered Date(s) Administered  . Zoster 09/30/2011   Screening Tests Health Maintenance  Topic Date Due  . MAMMOGRAM  08/22/2016 (Originally 01/22/1996)  . INFLUENZA VACCINE  11/22/2016 (Originally 10/23/2015)  . TETANUS/TDAP  05/22/2017 (Originally 01/21/1965)  . DEXA SCAN  03/24/2026 (Originally 01/22/2011)  . PNA vac Low Risk Adult (2 of 2 - PPSV23) 06/18/2017  .  COLONOSCOPY  10/17/2024  . Hepatitis C Screening  Completed      Plan:  I have personally reviewed and addressed the Medicare Annual Wellness questionnaire and have noted the following in the patient's chart:  A. Medical and social history B. Use of alcohol, tobacco or illicit drugs  C. Current medications and supplements D. Functional ability and status E.  Nutritional status F.  Physical activity G. Advance directives H. List of other physicians I.  Hospitalizations, surgeries, and ER visits in previous 12 months J.  Vitals K. Screenings  such as hearing and vision if needed, cognitive and depression L. Referrals and appointments - none  In addition, I have reviewed and discussed with patient certain preventive protocols, quality metrics, and best practice recommendations. A written personalized care plan for preventive services as well as general preventive health recommendations were provided to patient.  See attached scanned questionnaire for additional information.   Signed,  Fabio Neighbors, LPN Nurse Health Advisor   MD Recommendations: Pt declined DEXA scan, influenza and tetanus vaccine (states she will get a record of her last tdap) today. Pt is going to call to set up mammogram for this year.   Reviewed documentation and recommendations from [place] screening. Agree with plan and will review with patient at next office visit 06-27-16.

## 2016-06-27 ENCOUNTER — Encounter: Payer: Medicare HMO | Admitting: Family Medicine

## 2016-07-04 ENCOUNTER — Ambulatory Visit (INDEPENDENT_AMBULATORY_CARE_PROVIDER_SITE_OTHER): Payer: Medicare HMO | Admitting: Family Medicine

## 2016-07-04 ENCOUNTER — Other Ambulatory Visit: Payer: Self-pay | Admitting: Family Medicine

## 2016-07-04 ENCOUNTER — Encounter: Payer: Self-pay | Admitting: Family Medicine

## 2016-07-04 VITALS — BP 146/96 | HR 85 | Temp 97.9°F | Wt 178.8 lb

## 2016-07-04 DIAGNOSIS — D509 Iron deficiency anemia, unspecified: Secondary | ICD-10-CM | POA: Diagnosis not present

## 2016-07-04 DIAGNOSIS — E782 Mixed hyperlipidemia: Secondary | ICD-10-CM | POA: Diagnosis not present

## 2016-07-04 DIAGNOSIS — F339 Major depressive disorder, recurrent, unspecified: Secondary | ICD-10-CM

## 2016-07-04 DIAGNOSIS — Z85038 Personal history of other malignant neoplasm of large intestine: Secondary | ICD-10-CM

## 2016-07-04 DIAGNOSIS — R51 Headache: Secondary | ICD-10-CM

## 2016-07-04 DIAGNOSIS — Z87442 Personal history of urinary calculi: Secondary | ICD-10-CM | POA: Diagnosis not present

## 2016-07-04 DIAGNOSIS — R519 Headache, unspecified: Secondary | ICD-10-CM

## 2016-07-04 DIAGNOSIS — I10 Essential (primary) hypertension: Secondary | ICD-10-CM | POA: Diagnosis not present

## 2016-07-04 DIAGNOSIS — G8929 Other chronic pain: Secondary | ICD-10-CM

## 2016-07-04 MED ORDER — CITALOPRAM HYDROBROMIDE 20 MG PO TABS: 20.0000 mg | ORAL_TABLET | Freq: Every day | ORAL | 3 refills | Status: DC

## 2016-07-04 NOTE — Progress Notes (Signed)
     Patient: Aubri A Tackett, Female    DOB: 11/10/1945, 71 y.o.   MRN: [id number] Visit Date: 07/04/2016  Today's Provider: Dennis Chrismon, PA   Chief Complaint  Patient presents with  . Annual Exam   Subjective:    Annual physical exam Melissa Fitzgerald is a 71 y.o. female who presents today for health maintenance and complete physical. She feels fairly well. She reports exercising none. She reports she is sleeping fair. Using Ambien PRN.  -----------------------------------------------------------------   Review of Systems  Constitutional: Positive for activity change and fatigue.  HENT: Positive for ear discharge, ear pain, postnasal drip, rhinorrhea, sinus pain, sneezing and tinnitus.   Eyes: Positive for photophobia and pain.  Respiratory: Negative.   Cardiovascular: Negative.   Gastrointestinal: Positive for diarrhea.  Endocrine: Negative.   Genitourinary: Negative.   Musculoskeletal: Negative.   Skin: Negative.   Allergic/Immunologic: Positive for environmental allergies.  Neurological: Positive for dizziness, syncope, light-headedness and headaches.  Hematological: Negative.   Psychiatric/Behavioral: Positive for agitation, decreased concentration, dysphoric mood and sleep disturbance. The patient is nervous/anxious and is hyperactive.     Social History      She  reports that she has been smoking Cigarettes.  She has been smoking about 0.00 packs per day. She has never used smokeless tobacco. She reports that she drinks alcohol. She reports that she does not use drugs.       Social History   Social History  . Marital status: Widowed    Spouse name: N/A  . Number of children: N/A  . Years of education: N/A   Social History Main Topics  . Smoking status: Current Some Day Smoker    Packs/day: 0.00    Types: Cigarettes  . Smokeless tobacco: Never Used     Comment: still smoking every now and then, rarely one  . Alcohol use 0.0 oz/week     Comment:  occasionally  . Drug use: No  . Sexual activity: Not Asked   Other Topics Concern  . None   Social History Narrative  . None    Past Medical History:  Diagnosis Date  . Anemia   . Colon polyp   . Depression   . Hyperlipidemia   . Hypertension   . Irritable bowel disease   . Kidney stone   . Thyroid disease      Patient Active Problem List   Diagnosis Date Noted  . History of colon cancer 03/22/2015  . 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 Anemia, iron deficiency 10/23/2014  . HLD (hyperlipidemia) 10/23/2014  . Essential (primary) hypertension 10/23/2014  . Acid reflux 10/23/2014  . Clinical depression 10/23/2014  . Peptic ulcer 10/23/2014  . Hypothyroidism, postop 10/23/2014  . Anemia 10/18/2014  . Arthritis 10/18/2014  . Chronic constipation 10/18/2014  . Sleep disturbance 10/18/2014  . Elevated liver enzymes 10/18/2014  . Chronic headache 10/18/2014  . History of renal calculi 10/18/2014  . Malignant neoplasm of cecum (HCC) 10/17/2013    Past Surgical History:  Procedure Laterality Date  . ABDOMINAL HYSTERECTOMY    . BREAST ENHANCEMENT SURGERY    . CHOLECYSTECTOMY  2000  . COLECTOMY  09/2013  . COLONOSCOPY  09-13-13   Dr Skulski  . COLONOSCOPY WITH PROPOFOL N/A 10/18/2014   Procedure: COLONOSCOPY WITH PROPOFOL;  Surgeon: Seeplaputhur G Sankar, MD;  Location: [facility] END[facility]COPY;  Service: Endoscopy;  Laterality: N/A;  . ESOPHAGOGASTRODUODENOSCOPY (EGD) WITH PROPOFOL N/A 01/03/2015     Procedure: ESOPHAGOGASTRODUODENOSCOPY (EGD) WITH PROPOFOL;  Surgeon: Seeplaputhur G Sankar, MD;  Location: [facility] END[facility];  Service: Endoscopy;  Laterality: N/A;  . FOOT SURGERY    . KIDNEY STONE SURGERY    . NASAL SINUS SURGERY    . PORTACATH PLACEMENT  10/24/13  . THYROID SURGERY    . TONSILLECTOMY      Family History        Family Status  Relation Status  . Mother Deceased at age 79  . Father Deceased  . Sister Alive    . Brother Alive  . Sister Deceased  .    . Maternal Aunt   . Paternal Aunt         Her family history includes Arthritis in her sister; Breast cancer in her maternal aunt and paternal aunt; COPD in her brother; Diabetes in her mother and sister; Heart disease in her mother; Hyperlipidemia in her sister.     Allergies  Allergen Reactions  . Codeine Nausea And Vomiting  . Oxycodone Nausea And Vomiting  . Penicillins Nausea And Vomiting  . Tramadol Nausea And Vomiting     Current Outpatient Prescriptions:  .  carboxymethylcellulose (REFRESH PLUS) 0.5 % SOLN, 1 drop daily as needed., Disp: , Rfl:  .  levothyroxine (SYNTHROID, LEVOTHROID) 100 MCG tablet, TAKE ONE TABLET BY MOUTH ONCE DAILY BEFORE BREAKFAST, Disp: 30 tablet, Rfl: 6 .  meclizine (ANTIVERT) 25 MG tablet, Take 1 tablet (25 mg total) by mouth 4 (four) times daily as needed for dizziness. Nausea or anxiety., Disp: 30 tablet, Rfl: 3 .  metoprolol succinate (TOPROL-XL) 50 MG 24 hr tablet, TAKE ONE TABLET BY MOUTH ONCE DAILY, Disp: 90 tablet, Rfl: 3 .  venlafaxine XR (EFFEXOR-XR) 75 MG 24 hr capsule, TAKE ONE CAPSULE BY MOUTH ONCE DAILY WITH BREAKFAST, Disp: 30 capsule, Rfl: 3 .  zolpidem (AMBIEN) 5 MG tablet, TAKE ONE TABLET BY MOUTH AT BEDTIME AS NEEDED FOR SLEEP, Disp: 15 tablet, Rfl: 1 .  diphenoxylate-atropine (LOMOTIL) 2.5-0.025 MG tablet, TAKE ONE TABLET BY MOUTH THREE TIMES DAILY AS NEEDED FOR DIARRHEA OR LOOSE STOOLS (Patient not taking: Reported on 07/04/2016), Disp: 21 tablet, Rfl: 0 .  esomeprazole (NEXIUM) 40 MG capsule, Take 1 capsule (40 mg total) by mouth daily at 12 noon. (Patient not taking: Reported on 06/18/2016), Disp: 30 capsule, Rfl: 3 .  Ferrous Fumarate 324 (106 FE) MG TABS, Take 1 tablet by mouth daily. (Patient not taking: Reported on 07/04/2016), Disp: 30 tablet, Rfl: 3   Patient Care Team: Dennis E Chrismon, PA as PCP - General (Physician Assistant) Martin U Skulskie, MD as Referring Physician (Internal  Medicine) Melissa C Corcoran, MD as Referring Physician (Hematology and Oncology) George S Thurmond as Consulting Physician (Optometry)      Objective:   Vitals: BP (!) 146/96 (BP Location: Right Arm, Patient Position: Sitting, Cuff Size: Normal)   Pulse 85   Temp 97.9 F (36.6 C) (Oral)   Wt 178 lb 12.8 oz (81.1 kg)   SpO2 98%   BMI 28.86 kg/m    Vitals:   07/04/16 1521  BP: (!) 146/96  Pulse: 85  Temp: 97.9 F (36.6 C)  TempSrc: Oral  SpO2: 98%  Weight: 178 lb 12.8 oz (81.1 kg)     Physical Exam  Constitutional: She is oriented to person, place, and time. She appears well-developed and well-nourished.  HENT:  Head: Normocephalic and atraumatic.  Right Ear: External ear normal.  Left Ear: External ear normal.  Nose: Nose normal.  Mouth/Throat:   Oropharynx is clear and moist.  Eyes: Conjunctivae and EOM are normal. Pupils are equal, round, and reactive to light. Right eye exhibits no discharge.  Neck: Normal range of motion. Neck supple. No tracheal deviation present. No thyromegaly present.  Cardiovascular: Normal rate, regular rhythm, normal heart sounds and intact distal pulses.   No murmur heard. Pulmonary/Chest: Effort normal and breath sounds normal. No respiratory distress. She has no wheezes. She has no rales. She exhibits no tenderness.  Well healed scar left upper chest from porta-cath used during colon cancer treatment.  Abdominal: Soft. She exhibits no distension and no mass. There is no tenderness. There is no rebound and no guarding.  Genitourinary: No breast swelling or discharge.  Musculoskeletal: Normal range of motion. She exhibits no edema or tenderness.  Lymphadenopathy:    She has no cervical adenopathy.  Neurological: She is alert and oriented to person, place, and time. She has normal reflexes. No cranial nerve deficit. She exhibits normal muscle tone. Coordination normal.  Skin: Skin is warm and dry. No rash noted. No erythema.  Psychiatric: Her  behavior is normal. Judgment and thought content normal. Her speech is rapid and/or pressured. She exhibits a depressed mood.     Depression Screen PHQ 2/9 Scores 06/18/2016 06/18/2016 01/04/2015 10/10/2014  PHQ - 2 Score 6 6 1 3  PHQ- 9 Score 22 22 - 6  Exception Documentation - - - Medical reason      Assessment & Plan:     Routine Health Maintenance and Physical Exam  Exercise Activities and Dietary recommendations Goals    . Exercise           Recommend starting to exercise. Pt is going to start going to the gym 3 days a week for 1 hour.       Immunization History  Administered Date(s) Administered  . MMR 12/24/1994  . Pneumococcal Conjugate-13 06/18/2016  . Td 12/17/1994, 09/03/2004  . Tdap 05/15/2014  . Zoster 09/30/2011    Health Maintenance  Topic Date Due  . MAMMOGRAM  08/22/2016 (Originally 01/22/1996)  . INFLUENZA VACCINE  07/04/2017 (Originally 10/22/2016)  . DEXA SCAN  03/24/2026 (Originally 01/22/2011)  . PNA vac Low Risk Adult (2 of 2 - PPSV23) 06/18/2017  . TETANUS/TDAP  05/15/2024  . COLONOSCOPY  10/17/2024  . Hepatitis C Screening  Completed     Discussed health benefits of physical activity, and encouraged her to engage in regular exercise appropriate for her age and condition.    -------------------------------------------------------------------- 1. Essential (primary) hypertension A little elevated BP today. Has not taken the Metoprolol Succinate 50 mg (QD) today. Recommend she limit sodium intake, get CBC and CMP as ordered by Dr. Corcoran. Add Lipid panel and TSH. Pending reports plan follow up of BP to assess need for medication adjustments. - Lipid panel - TSH  2. History of colon cancer History of stage III colon cancer s/p right hemicolectomy on 09/29/2013. 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  There was lymph-vascular invasion.  There was no perineural  invasion.  Margins were negative.  One of 14 lymph nodes were negative.  Pathologic stage was [id number]. She received 12 cycles of adjuvant chemotherapy.  She received 6 cycles of FOLFOX.  FOLFOX was switched to 5FU/LV secondary to a neuropathy. Continues follow up with Dr. Corcoran (hematology/oncology) every 6 months and next appointment with labs will be 08-25-16.    3. History of renal calculi Recent CT scan of abdomen by   oncologist showed some bilateral non-obstructing nephrolithiasis. No hematuria or flank/abdominal pains. May need recheck with urologist if symptoms develop.  4. Recurrent major depressive disorder, remission status unspecified ([place]) Still having sleep disturbance and occasionally using Ambien prn. Feels the Venlafaxine is no help with depressive symptoms. Denies suicidal or homicidal ideations. Will switch to Citalopram and get follow up labs. Recheck pending reports. - TSH - citalopram (CELEXA) 20 MG tablet; Take 1 tablet (20 mg total) by mouth daily.  Dispense: 30 tablet; Refill: 3  5. Chronic nonintractable headache, unspecified headache type Persistent for years with short term relief from NSAID or Tylenol. Tries to stay away from taking much in the way of analgesic medications. [facility] (Dr. Domingo Cocking) in [place]. Will check routine labs and schedule referral. - TSH - Ambulatory referral to Neurology  6. Iron deficiency anemia, unspecified iron deficiency anemia type Still taking oral iron supplement. Hematologist/oncologist (Dr. Mike Gip) treated with Venofer 200 mg weekly for 4 treatments in Dec. 2017 and last ferritin was 200 on 03-20-16. States she feels fairly well except frequent headaches daily which have been present for years. Follow up planned with Dr. Mike Gip 08-25-16. No hematemesis or melena.  7. Mixed hyperlipidemia Trying to follow low fat diet but not exercising routinely. Will recheck Lipid and TSH. Dr. Mike Gip  will be getting CMP and CBC in a month. Review these results when available. - Lipid panel - TSH    Vernie Murders, PA  [facility]

## 2016-07-17 ENCOUNTER — Telehealth: Payer: Self-pay | Admitting: Family Medicine

## 2016-07-17 NOTE — Telephone Encounter (Signed)
FYI--Dr Freeman's office contacted pt on 07/08/16 to schedule neurology referral and was told by pt she could not schedule at this time.She states she will contact them when ready to schedule

## 2016-07-17 NOTE — Telephone Encounter (Signed)
Please review

## 2016-08-06 ENCOUNTER — Other Ambulatory Visit: Payer: Self-pay | Admitting: Family Medicine

## 2016-08-07 NOTE — Telephone Encounter (Signed)
Called in. Johnni Wunschel Drozdowski, CMA  

## 2016-08-07 NOTE — Telephone Encounter (Signed)
LOV 07/04/2016. Last refill 06/09/2016. Renaldo Fiddler, CMA

## 2016-08-07 NOTE — Telephone Encounter (Signed)
Phone in refill to [facility]

## 2016-08-14 ENCOUNTER — Other Ambulatory Visit: Payer: Self-pay | Admitting: Hematology and Oncology

## 2016-08-15 ENCOUNTER — Emergency Department: Payer: Medicare HMO

## 2016-08-15 ENCOUNTER — Emergency Department
Admission: EM | Admit: 2016-08-15 | Discharge: 2016-08-15 | Disposition: A | Discharge status: Home / Self Care | Payer: Medicare HMO | Attending: Emergency Medicine | Admitting: Emergency Medicine

## 2016-08-15 ENCOUNTER — Encounter: Payer: Self-pay | Admitting: Intensive Care

## 2016-08-15 DIAGNOSIS — Z85038 Personal history of other malignant neoplasm of large intestine: Secondary | ICD-10-CM | POA: Diagnosis not present

## 2016-08-15 DIAGNOSIS — R5383 Other fatigue: Secondary | ICD-10-CM | POA: Diagnosis not present

## 2016-08-15 DIAGNOSIS — E876 Hypokalemia: Secondary | ICD-10-CM | POA: Diagnosis not present

## 2016-08-15 DIAGNOSIS — Z79899 Other long term (current) drug therapy: Secondary | ICD-10-CM | POA: Insufficient documentation

## 2016-08-15 DIAGNOSIS — I10 Essential (primary) hypertension: Secondary | ICD-10-CM | POA: Diagnosis not present

## 2016-08-15 DIAGNOSIS — R110 Nausea: Secondary | ICD-10-CM | POA: Diagnosis not present

## 2016-08-15 DIAGNOSIS — R093 Abnormal sputum: Secondary | ICD-10-CM | POA: Diagnosis not present

## 2016-08-15 DIAGNOSIS — F17210 Nicotine dependence, cigarettes, uncomplicated: Secondary | ICD-10-CM | POA: Insufficient documentation

## 2016-08-15 DIAGNOSIS — E039 Hypothyroidism, unspecified: Secondary | ICD-10-CM | POA: Insufficient documentation

## 2016-08-15 DIAGNOSIS — R42 Dizziness and giddiness: Secondary | ICD-10-CM | POA: Diagnosis present

## 2016-08-15 LAB — URINALYSIS, COMPLETE (UACMP) WITH MICROSCOPIC
Bacteria, UA: NONE SEEN
Bilirubin Urine: NEGATIVE
Glucose, UA: NEGATIVE mg/dL
Hgb urine dipstick: NEGATIVE
Ketones, ur: NEGATIVE mg/dL
Leukocytes, UA: NEGATIVE
Nitrite: NEGATIVE
Protein, ur: NEGATIVE mg/dL
RBC / HPF: NONE SEEN RBC/hpf (ref 0–5)
Specific Gravity, Urine: 1.012 (ref 1.005–1.030)
pH: 6 (ref 5.0–8.0)

## 2016-08-15 LAB — HEPATIC FUNCTION PANEL
ALT: 15 U/L (ref 14–54)
AST: 27 U/L (ref 15–41)
Albumin: 4.2 g/dL (ref 3.5–5.0)
Alkaline Phosphatase: 51 U/L (ref 38–126)
Bilirubin, Direct: <0.1 mg/dL — ABNORMAL LOW (ref 0.1–0.5)
Total Bilirubin: 0.6 mg/dL (ref 0.3–1.2)
Total Protein: 8 g/dL (ref 6.5–8.1)

## 2016-08-15 LAB — BASIC METABOLIC PANEL
Anion gap: 7 (ref 5–15)
BUN: 7 mg/dL (ref 6–20)
CO2: 29 mmol/L (ref 22–32)
Calcium: 9.3 mg/dL (ref 8.9–10.3)
Chloride: 102 mmol/L (ref 101–111)
Creatinine, Ser: 0.86 mg/dL (ref 0.44–1.00)
GFR calc Af Amer: >60 mL/min (ref >60)
GFR calc non Af Amer: >60 mL/min (ref >60)
Glucose, Bld: 96 mg/dL (ref 65–99)
Potassium: 3.2 mmol/L — ABNORMAL LOW (ref 3.5–5.1)
Sodium: 138 mmol/L (ref 135–145)

## 2016-08-15 LAB — CBC
HCT: 31.9 % — ABNORMAL LOW (ref 35.0–47.0)
Hemoglobin: 10.7 g/dL — ABNORMAL LOW (ref 12.0–16.0)
MCH: 30.5 pg (ref 26.0–34.0)
MCHC: 33.6 g/dL (ref 32.0–36.0)
MCV: 90.9 fL (ref 80.0–100.0)
Platelets: 250 10*3/uL (ref 150–440)
RBC: 3.51 MIL/uL — ABNORMAL LOW (ref 3.80–5.20)
RDW: 14 % (ref 11.5–14.5)
WBC: 4.3 10*3/uL (ref 3.6–11.0)

## 2016-08-15 LAB — TROPONIN I: Troponin I: <0.03 ng/mL (ref <0.03)

## 2016-08-15 LAB — LIPASE, BLOOD: Lipase: 34 U/L (ref 11–51)

## 2016-08-15 LAB — TSH: TSH: 1.152 u[IU]/mL (ref 0.350–4.500)

## 2016-08-15 MED ORDER — POTASSIUM CHLORIDE ER 10 MEQ PO TBCR: 10.0000 meq | EXTENDED_RELEASE_TABLET | Freq: Every day | ORAL | 0 refills | Status: DC

## 2016-08-15 MED ORDER — POTASSIUM CHLORIDE CRYS ER 20 MEQ PO TBCR
40.0000 meq | EXTENDED_RELEASE_TABLET | Freq: Once | ORAL | Status: AC
  Administered 2016-08-15: 40 meq via ORAL
  Filled 2016-08-15: qty 2

## 2016-08-15 NOTE — ED Notes (Signed)
Pt presents with n/v/d intermittently since Monday. Diarrhea seems to have backed off since Wednesday, but vomiting continues in small amounts (2x in last 24 hours). Pt states she has been feeling similar to when she was undergoing treatment for cancer 2 years ago (chemo). She had to have iron treatments to feel better. Pt alert & oriented. NAD noted.

## 2016-08-15 NOTE — ED Notes (Signed)
Pt reports that her daughter Rheana Casebolt may call and we are free to give her information

## 2016-08-15 NOTE — ED Notes (Signed)
Pt reports that she did not take her bp meds this morning. States she "is a headache sufferer" and that she has been referred to a headache clinic in [place].

## 2016-08-15 NOTE — ED Provider Notes (Signed)
[facility] Emergency Department Provider Note   ____________________________________________   First MD Initiated Contact with Patient 08/15/16 0920     (approximate)  I have reviewed the triage vital signs and the nursing notes.   HISTORY  Chief Complaint Dizziness    HPI Melissa Fitzgerald is a 71 y.o. female here for evaluation of fatigue, occasional nausea, and also having a cough for about a week.  Patient reports that she's been feeling nauseated at times. She did vomit a couple times over the last 24 hours, but reports this seems to be backing off. She had diarrhea as well which is now improved. She reports overall that she is improving, but continues to feel fatigued. She also reports that she is being treated for depression, but denies any desire to harm herself or anyone else. She wants to make sure that her thyroid tests are normal today as this is also been adjusted.  Denies any chest pain or trouble breathing. She has had a slight cough for the last week, but also reports this is slowly improving. She continues to feel fairly fatigued however.  Denies being in pain at this time.  She did not have any black or bloody stools  Past Medical History:  Diagnosis Date  . Anemia   . Colon polyp   . Depression   . Hyperlipidemia   . Hypertension   . Irritable bowel disease   . Kidney stone   . Thyroid disease     Patient Active Problem List   Diagnosis Date Noted  . History of colon cancer 03/22/2015  . 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 Anemia, iron deficiency 10/23/2014  . HLD (hyperlipidemia) 10/23/2014  . Essential (primary) hypertension 10/23/2014  . Acid reflux 10/23/2014  . Clinical depression 10/23/2014  . Peptic ulcer 10/23/2014  . Hypothyroidism, postop 10/23/2014  . Anemia 10/18/2014  . Arthritis 10/18/2014  . Chronic constipation 10/18/2014  . Sleep disturbance  10/18/2014  . Elevated liver enzymes 10/18/2014  . Chronic headache 10/18/2014  . History of renal calculi 10/18/2014  . Malignant neoplasm of cecum ([place]) 10/17/2013    Past Surgical History:  Procedure Laterality Date  . ABDOMINAL HYSTERECTOMY    . BREAST ENHANCEMENT SURGERY    . CHOLECYSTECTOMY  2000  . COLECTOMY  09/2013  . COLONOSCOPY  09-13-13   Dr [name]  . COLONOSCOPY WITH PROPOFOL N/A 10/18/2014   Procedure: COLONOSCOPY WITH PROPOFOL;  Surgeon: [name], MD;  Location: [facility] ENDOSCOPY;  Service: Endoscopy;  Laterality: N/A;  . ESOPHAGOGASTRODUODENOSCOPY (EGD) WITH PROPOFOL N/A 01/03/2015   Procedure: ESOPHAGOGASTRODUODENOSCOPY (EGD) WITH PROPOFOL;  Surgeon: [name], MD;  Location: [facility] ENDOSCOPY;  Service: Endoscopy;  Laterality: N/A;  . FOOT SURGERY    . KIDNEY STONE SURGERY    . NASAL SINUS SURGERY    . PORTACATH PLACEMENT  10/24/13  . THYROID SURGERY    . TONSILLECTOMY      Prior to Admission medications   Medication Sig Start Date End Date Taking? Authorizing Provider  carboxymethylcellulose (REFRESH PLUS) 0.5 % SOLN 1 drop daily as needed.    [provider]  citalopram (CELEXA) 20 MG tablet Take 1 tablet (20 mg total) by mouth daily. 07/04/16   Chrismon, Vickki Muff, PA  diphenoxylate-atropine (LOMOTIL) 2.5-0.025 MG tablet TAKE ONE TABLET BY MOUTH THREE TIMES DAILY AS NEEDED FOR DIARRHEA OR LOOSE STOOLS Patient not taking: Reported on 07/04/2016 05/09/16   Chrismon,  Vickki Muff, PA  Ferrous Fumarate 324 (106 FE) MG TABS Take 1 tablet by mouth daily. Patient not taking: Reported on 07/04/2016 10/18/14   Forest Gleason, MD  levothyroxine (SYNTHROID, LEVOTHROID) 100 MCG tablet TAKE ONE TABLET BY MOUTH ONCE DAILY BEFORE BREAKFAST 12/13/15   Chrismon, Vickki Muff, PA  meclizine (ANTIVERT) 25 MG tablet Take 1 tablet (25 mg total) by mouth 4 (four) times daily as needed for dizziness. Nausea or anxiety. 05/11/15   Chrismon, Vickki Muff, PA  metoprolol succinate  (TOPROL-XL) 50 MG 24 hr tablet TAKE ONE TABLET BY MOUTH ONCE DAILY 04/11/16   Mar Daring, PA-C  potassium chloride (K-DUR) 10 MEQ tablet Take 1 tablet (10 mEq total) by mouth daily. 08/15/16   Delman Kitten, MD  zolpidem (AMBIEN) 5 MG tablet TAKE 1 TABLET BY MOUTH AT BEDTIME AS NEEDED FOR SLEEP 08/07/16   Chrismon, Vickki Muff, PA    Allergies Codeine; Oxycodone; Penicillins; and Tramadol  Family History  Problem Relation Age of Onset  . Heart disease Mother   . Diabetes Mother   . Arthritis Sister   . Hyperlipidemia Sister   . COPD Brother   . Diabetes Sister   . Colon cancer Unknown        first cousin  . Breast cancer Maternal Aunt   . Breast cancer Paternal Aunt     Social History Social History  Substance Use Topics  . Smoking status: Current Some Day Smoker    Packs/day: 0.00    Types: Cigarettes  . Smokeless tobacco: Never Used     Comment: still smoking every now and then, rarely one  . Alcohol use 0.0 oz/week     Comment: occasionally    Review of Systems Constitutional: No fever/chills. No fatigue Eyes: No visual changes. ENT: No sore throat. Cardiovascular: Denies chest pain. Respiratory: Denies shortness of breath. See history of present illness Gastrointestinal: No abdominal pain.    No constipation. Reports she felt "crampy" the last few days with loose stools which is now improving. Genitourinary: Negative for dysuria. Musculoskeletal: Negative for back pain. Skin: Negative for rash. Neurological: Negative for headaches, focal weakness or numbness.  10-point ROS otherwise negative.  ____________________________________________   PHYSICAL EXAM:  VITAL SIGNS: ED Triage Vitals  Enc Vitals Group     BP 08/15/16 0850 (!) 179/92     Pulse Rate 08/15/16 0850 (!) 56     Resp 08/15/16 0850 20     Temp 08/15/16 0850 97.8 F (36.6 C)     Temp Source 08/15/16 0850 Oral     SpO2 08/15/16 0850 99 %     Weight 08/15/16 0853 180 lb (81.6 kg)     Height  08/15/16 0853 5\' 6"  (1.676 m)     Head Circumference --      Peak Flow --      Pain Score 08/15/16 0850 5     Pain Loc --      Pain Edu? --      Excl. in [place]? --     Constitutional: Alert and oriented. Well appearing and in no acute distress.She is very pleasant. Eyes: Conjunctivae are normal. PERRL. EOMI. Head: Atraumatic. Nose: No congestion/rhinnorhea. Mouth/Throat: Mucous membranes are moist.  Oropharynx non-erythematous. Neck: No stridor.   Cardiovascular: Normal rate, regular rhythm. Grossly normal heart sounds.  Good peripheral circulation. Respiratory: Normal respiratory effort.  No retractions. Lungs CTAB. Gastrointestinal: Soft and nontender. No distention. No pain in the right upper quadrant. No rebound or guarding in  any quadrant. Musculoskeletal: No lower extremity tenderness nor edema.  Neurologic:  Normal speech and language. No gross focal neurologic deficits are appreciated.  Skin:  Skin is warm, dry and intact. No rash noted. Psychiatric: Mood and affect are normal. Speech and behavior are normal.  ____________________________________________   LABS (all labs ordered are listed, but only abnormal results are displayed)  Labs Reviewed  BASIC METABOLIC PANEL - Abnormal; Notable for the following:       Result Value   Potassium 3.2 (*)    All other components within normal limits  CBC - Abnormal; Notable for the following:    RBC 3.51 (*)    Hemoglobin 10.7 (*)    HCT 31.9 (*)    All other components within normal limits  URINALYSIS, COMPLETE (UACMP) WITH MICROSCOPIC - Abnormal; Notable for the following:    Color, Urine YELLOW (*)    APPearance CLEAR (*)    Squamous Epithelial / LPF 0-5 (*)    All other components within normal limits  HEPATIC FUNCTION PANEL - Abnormal; Notable for the following:    Bilirubin, Direct <0.1 (*)    All other components within normal limits  TROPONIN I  TSH  LIPASE, BLOOD    ____________________________________________  EKG  Reviewed and interpreted by me at 9 AM Ventricular rate 60 Normal sinus rhythm, minimal T-wave abnormality seen including slight inversions in inferior and lateral distribution, these are nonspecific. No evidence of acute ischemia noted. Of note the patient does not have any cardiac symptomatology associated with the EKG being performed ____________________________________________  RADIOLOGY  Dg Chest 2 View  Result Date: 08/15/2016 CLINICAL DATA:  Sputum production EXAM: CHEST  2 VIEW COMPARISON:  07/18/2015 FINDINGS: Mild cardiomegaly. Lungs are clear. No effusions. No acute bony abnormality. IMPRESSION: Mild cardiomegaly.  No active disease. Electronically Signed   By: Rolm Baptise M.D.   On: 08/15/2016 10:13     ____________________________________________   PROCEDURES  Procedure(s) performed: None  Procedures  Critical Care performed: No  ____________________________________________   INITIAL IMPRESSION / ASSESSMENT AND PLAN / ED COURSE  Pertinent labs & imaging results that were available during my care of the patient were reviewed by me and considered in my medical decision making (see chart for details).  Patient transfer evaluation of mild fatigue which has been ongoing with slight nausea after having what appear to be gastrointestinal symptoms and a cough. All the symptoms seem to be improving except for ongoing fatigue. Her exam is very reassuring. She has no neurologic cardiac or acute pulmonary symptoms such as dyspnea. She does report a dry cough, but again reports this along with the nausea and diarrhea she had is improving. Her labs are reassuring, and I suspect she likely is suffering from mild postviral fatigue, with no evidence of an acute bacterial infection noted on today's evaluation. No evidence of acute cardiac ischemia. Her TSH is normal. Her potassium was slightly low, we will replete this today and  over the next few days as this may be part of her fatigue. Her EKG does not demonstrate a prolonged QT or evidence of an acute concern for hypokalemia. She is able to take by mouth well.  Return precautions and treatment recommendations and follow-up discussed with the patient who is agreeable with the plan.       ____________________________________________   FINAL CLINICAL IMPRESSION(S) / ED DIAGNOSES  Final diagnoses:  Fatigue, unspecified type  Hypokalemia  Nausea      NEW MEDICATIONS STARTED DURING THIS  VISIT:  Discharge Medication List as of 08/15/2016 12:47 PM    START taking these medications   Details  potassium chloride (K-DUR) 10 MEQ tablet Take 1 tablet (10 mEq total) by mouth daily., Starting Fri 08/15/2016, Print         Note:  This document was prepared using Dragon voice recognition software and may include unintentional dictation errors.     Delman Kitten, MD 08/16/16 1158

## 2016-08-15 NOTE — ED Notes (Signed)
Pt took her metroprolol dosage from her personal supply.

## 2016-08-15 NOTE — ED Triage Notes (Signed)
Patient presents to ER with dizziness and light pink sputum X1-2 weeks. HX low iron and receiving infusion. HX colon cancer and migraines. Reports headache and pain behind R ear. Reports N/V with 2-3 episodes of emesis in the last 24 hours. Diarrhea off and ot throughout the week - taking imodium with some relief

## 2016-08-25 ENCOUNTER — Inpatient Hospital Stay: Payer: Medicare HMO | Admitting: Hematology and Oncology

## 2016-08-25 ENCOUNTER — Inpatient Hospital Stay: Payer: Medicare HMO

## 2016-08-26 ENCOUNTER — Ambulatory Visit (INDEPENDENT_AMBULATORY_CARE_PROVIDER_SITE_OTHER): Payer: Medicare HMO | Admitting: Family Medicine

## 2016-08-26 ENCOUNTER — Encounter: Payer: Self-pay | Admitting: Family Medicine

## 2016-08-26 VITALS — BP 158/98 | HR 76 | Temp 98.6°F | Wt 174.0 lb

## 2016-08-26 DIAGNOSIS — F339 Major depressive disorder, recurrent, unspecified: Secondary | ICD-10-CM

## 2016-08-26 DIAGNOSIS — E876 Hypokalemia: Secondary | ICD-10-CM

## 2016-08-26 NOTE — Progress Notes (Signed)
Patient: Melissa Fitzgerald Female    DOB: 1945/11/28   71 y.o.   MRN: [id number] Visit Date: 08/26/2016  Today's Provider: Vernie Murders, PA   Chief Complaint  Patient presents with  . Depression  . Hypertension  . Follow-up   Subjective:    HPI  Hypertension, follow-up:  BP Readings from Last 3 Encounters:  08/26/16 (!) 158/98  08/15/16 119/80  07/04/16 (!) 146/96    She was last seen for hypertension 1 months ago.  BP at that visit was 146/96. Management changes since that visit include advised to limit sodium intake and continue medications. She reports good compliance with treatment. She is not having side effects.  She is exercising lightly. She is adherent to low salt diet.   Outside blood pressures are being checked. She is experiencing fatigue.  Patient denies chest pain, chest pressure/discomfort, irregular heart beat and palpitations.   Cardiovascular risk factors include advanced age (older than 30 for men, 3 for women), dyslipidemia and hypertension.  Use of agents associated with hypertension: none.     Weight trend: stable Wt Readings from Last 3 Encounters:  08/26/16 174 lb (78.9 kg)  08/15/16 180 lb (81.6 kg)  07/04/16 178 lb 12.8 oz (81.1 kg)    ------------------------------------------------------------------------  Depression, Follow-up  Shewas last seen for this 1 monthsago. Changes made at last visit include switched from Effexor to Celexa and using Ambien PRN.  Shereports goodcompliance with treatment. She is nothaving side effects  Shereports goodtolerance of treatment. Current symptoms include: depressed mood, no appetite, restless, fatigue and weakness Shereports symptoms are unchanged and she went to ER for evaluation.   Patient Active Problem List   Diagnosis Date Noted  . History of colon cancer 03/22/2015  . 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 Anemia, iron  deficiency 10/23/2014  . HLD (hyperlipidemia) 10/23/2014  . Essential (primary) hypertension 10/23/2014  . Acid reflux 10/23/2014  . Clinical depression 10/23/2014  . Peptic ulcer 10/23/2014  . Hypothyroidism, postop 10/23/2014  . Anemia 10/18/2014  . Arthritis 10/18/2014  . Chronic constipation 10/18/2014  . Sleep disturbance 10/18/2014  . Elevated liver enzymes 10/18/2014  . Chronic headache 10/18/2014  . History of renal calculi 10/18/2014  . Malignant neoplasm of cecum ([place]) 10/17/2013   Past Surgical History:  Procedure Laterality Date  . ABDOMINAL HYSTERECTOMY    . BREAST ENHANCEMENT SURGERY    . CHOLECYSTECTOMY  2000  . COLECTOMY  09/2013  . COLONOSCOPY  09-13-13   Dr [name]  . COLONOSCOPY WITH PROPOFOL N/A 10/18/2014   Procedure: COLONOSCOPY WITH PROPOFOL;  Surgeon: [name], MD;  Location: [facility] ENDOSCOPY;  Service: Endoscopy;  Laterality: N/A;  . ESOPHAGOGASTRODUODENOSCOPY (EGD) WITH PROPOFOL N/A 01/03/2015   Procedure: ESOPHAGOGASTRODUODENOSCOPY (EGD) WITH PROPOFOL;  Surgeon: [name], MD;  Location: [facility] ENDOSCOPY;  Service: Endoscopy;  Laterality: N/A;  . FOOT SURGERY    . KIDNEY STONE SURGERY    . NASAL SINUS SURGERY    . PORTACATH PLACEMENT  10/24/13  . THYROID SURGERY    . TONSILLECTOMY     Family History  Problem Relation Age of Onset  . Heart disease Mother   . Diabetes Mother   . Arthritis Sister   . Hyperlipidemia Sister   . COPD Brother   . Diabetes Sister   . Colon cancer Unknown        first cousin  . Breast cancer Maternal Aunt   . Breast cancer  Paternal Aunt    Allergies  Allergen Reactions  . Codeine Nausea And Vomiting  . Oxycodone Nausea And Vomiting  . Penicillins Nausea And Vomiting  . Tramadol Nausea And Vomiting   Previous Medications   CARBOXYMETHYLCELLULOSE (REFRESH PLUS) 0.5 % SOLN    1 drop daily as needed.   CITALOPRAM (CELEXA) 20 MG TABLET    Take 1 tablet (20 mg total) by mouth daily.    DIPHENOXYLATE-ATROPINE (LOMOTIL) 2.5-0.025 MG TABLET    TAKE ONE TABLET BY MOUTH THREE TIMES DAILY AS NEEDED FOR DIARRHEA OR LOOSE STOOLS   FERROUS FUMARATE 324 (106 FE) MG TABS    Take 1 tablet by mouth daily.   LEVOTHYROXINE (SYNTHROID, LEVOTHROID) 100 MCG TABLET    TAKE ONE TABLET BY MOUTH ONCE DAILY BEFORE BREAKFAST   MECLIZINE (ANTIVERT) 25 MG TABLET    Take 1 tablet (25 mg total) by mouth 4 (four) times daily as needed for dizziness. Nausea or anxiety.   METOPROLOL SUCCINATE (TOPROL-XL) 50 MG 24 HR TABLET    TAKE ONE TABLET BY MOUTH ONCE DAILY   POTASSIUM CHLORIDE (K-DUR) 10 MEQ TABLET    Take 1 tablet (10 mEq total) by mouth daily.   ZOLPIDEM (AMBIEN) 5 MG TABLET    TAKE 1 TABLET BY MOUTH AT BEDTIME AS NEEDED FOR SLEEP    Review of Systems  Constitutional: Positive for appetite change and fatigue.  Respiratory: Negative.   Cardiovascular: Negative.   Neurological: Positive for weakness.  Psychiatric/Behavioral: Positive for dysphoric mood.    Social History  Substance Use Topics  . Smoking status: Current Some Day Smoker    Packs/day: 0.00    Types: Cigarettes  . Smokeless tobacco: Never Used     Comment: still smoking every now and then, rarely one  . Alcohol use 0.0 oz/week     Comment: occasionally   Objective:   BP (!) 158/98 (BP Location: Right Arm, Patient Position: Sitting, Cuff Size: Normal)   Pulse 76   Temp 98.6 F (37 C) (Oral)   Wt 174 lb (78.9 kg)   SpO2 96%   BMI 28.08 kg/m   Physical Exam  Constitutional: She is oriented to person, place, and time. She appears well-developed and well-nourished.  HENT:  Head: Normocephalic.  Right Ear: External ear normal.  Left Ear: External ear normal.  Nose: Nose normal.  Eyes: Conjunctivae are normal.  Neck: Neck supple.  Cardiovascular: Normal rate.   Pulmonary/Chest: Effort normal and breath sounds normal.  Abdominal: Bowel sounds are normal.  Musculoskeletal: Normal range of motion.  Neurological: She is  alert and oriented to person, place, and time.  Psychiatric: Her behavior is normal. Thought content normal. Her affect is blunt. Her speech is rapid and/or pressured.      Assessment & Plan:     1. Hypokalemia Potassium was down to 3.2 in the ER on 08-15-16 for evaluation of fatigue and depression. Was given KCL 10 meq qd for 7 days. Feeling fatigue is only slightly better. Associates fatigue with depression. Will recheck labs and follow up pending reports. May need to change Metoprolol to Lisinopril to preserve K+. - CBC with Differential/Platelet - Comprehensive metabolic panel  2. Recurrent major depressive disorder, remission status unspecified ([place]) Still feels sad and "blue". No suicidal ideation. Does not feel the Citalopram has been effective over the past month (even increased dosage to two of the 20 mg tablets daily the past 3 weeks). If labs are normal, will consider change to Trintellix or Viibryd. -  CBC with Differential/Platelet

## 2016-08-26 NOTE — Patient Instructions (Signed)
Persistent Depressive Disorder, Adult Persistent depressive disorder (PDD) is a mental health condition that causes symptoms of low-level depression for 2 years or longer. It may also be called long-term (chronic) depression or dysthymia. PDD may include episodes of more severe depression that last for about 2 weeks (major depressive disorder or MDD). PDD can affect the way you think, feel, and sleep. This condition may also affect your relationships. You may be more likely to get sick if you have PDD. What are the causes? The exact cause of this condition is not known. PDD is most likely caused by a combination of things, which may include:  Genetic factors. These are traits that are passed along from parent to child.  Individual factors. Your personality, your behavior, and the way you handle your thoughts and feelings may contribute to PDD. This includes personality traits and behaviors learned from others.  Physical factors, such as: ? Differences in the part of your brain that controls emotion. This part of your brain may be different than it is in people who do not have PDD. ? Long-term (chronic) medical or psychiatric illnesses.  Social factors. Traumatic experiences or major life changes may play a role in the development of PDD.  What increases the risk? This condition is more likely to develop in women. The following factors may make you more likely to develop PDD:  A family history of depression.  Abnormally low levels of certain brain chemicals.  Traumatic events in childhood, especially abuse or the loss of a parent.  Being under a lot of stress, or long-term stress, especially from upsetting life experiences or losses.  A history of: ? Chronic physical illness. ? Other mental health disorders. ? Substance abuse.  Poor living conditions.  Experiencing social exclusion or discrimination on a regular basis.  What are the signs or symptoms? Symptoms of this condition  occur for most of the day, and may include:  Fatigue or low energy.  Eating too much or too little.  Sleeping too much or too little.  Restlessness or agitation.  Feelings of hopelessness.  Feeling worthless or guilty.  Anxiety.  Poor concentration or difficulty making decisions.  Low self-esteem.  Negative outlook.  Inability to have fun or experience pleasure.  Social withdrawal.  Unexplained physical complaints.  Irritability.  Aggressive behavior or anger.  How is this diagnosed? This condition may be diagnosed based on:  Your symptoms.  Your medical history, including your mental health history. This may involve tests to evaluate your mental health. You may be asked questions about your lifestyle, including any drug and alcohol use, and how long you have had symptoms of PDD.  A physical exam.  Blood tests to rule out other conditions.  You may be diagnosed with PDD if you have had a depressed mood for 2 years or longer, as well as other symptoms of depression. How is this treated? This condition is usually treated by mental health professionals, such as psychologists, psychiatrists, and clinical social workers. You may need more than one type of treatment. Treatment may include:  Psychotherapy. This is also called talk therapy or counseling. Types of psychotherapy include: ? Cognitive behavioral therapy (CBT). This type of therapy teaches you to recognize unhealthy feelings, thoughts, and behaviors, and replace them with positive thoughts and actions. ? Interpersonal therapy (IPT). This helps you to improve the way you relate to and communicate with others. ? Family therapy. This treatment includes members of your family.  Medicine to treat anxiety and   depression, or to help you control certain emotions and behaviors.  Lifestyle changes, such as: ? Limiting alcohol and drug use. ? Exercising regularly. ? Getting plenty of sleep. ? Making healthy eating  choices. ? Spending more time outdoors.  Follow these instructions at home: Activity  Return to your normal activities as told by your health care provider.  Exercise regularly and spend time outdoors as told by your health care provider. General instructions  Take over-the-counter and prescription medicines only as told by your health care provider.  Do not drink alcohol. If you drink alcohol, limit your alcohol intake to no more than 1 drink a day for nonpregnant women and 2 drinks a day for men. One drink equals 12 oz of beer, 5 oz of wine, or 1 oz of hard liquor. Alcohol can affect any antidepressant medicines you are taking. Talk to your health care provider about your alcohol use.  Eat a healthy diet and get plenty of sleep.  Find activities that you enjoy doing, and make time to do them.  Consider joining a support group. Your health care provider may be able to recommend a support group.  Keep all follow-up visits as told by your health care provider. This is important. Where to find more information: [employer] on Mental Illness  www.nami.org  U.S. National Institute of Mental Health  https://carter.com/  National Suicide Prevention Lifeline  1-800-273-TALK (469)262-2913). This is free, 24-hour help.  Contact a health care provider if:  Your symptoms get worse.  You develop new symptoms.  You have trouble sleeping or doing your daily activities. Get help right away if:  You self-harm.  You have serious thoughts about hurting yourself or others.  You see, hear, taste, smell, or feel things that are not present (hallucinate). This information is not intended to replace advice given to you by your health care provider. Make sure you discuss any questions you have with your health care provider. Document Released: 02/25/2012 Document Revised: 11/08/2015 Document Reviewed: 09/22/2015 Elsevier Interactive Patient Education  2017 [employer]. Hypokalemia Hypokalemia means that the amount of potassium in the blood is lower than normal.Potassium is a chemical that helps regulate the amount of fluid in the body (electrolyte). It also stimulates muscle tightening (contraction) and helps nerves work properly.Normally, most of the body's potassium is inside of cells, and only a very small amount is in the blood. Because the amount in the blood is so small, minor changes to potassium levels in the blood can be life-threatening. What are the causes? This condition may be caused by:  Antibiotic medicine.  Diarrhea or vomiting. Taking too much of a medicine that helps you have a bowel movement (laxative) can cause diarrhea and lead to hypokalemia.  Chronic kidney disease (CKD).  Medicines that help the body get rid of excess fluid (diuretics).  Eating disorders, such as bulimia.  Low magnesium levels in the body.  Sweating a lot.  What are the signs or symptoms? Symptoms of this condition include:  Weakness.  Constipation.  Fatigue.  Muscle cramps.  Mental confusion.  Skipped heartbeats or irregular heartbeat (palpitations).  Tingling or numbness.  How is this diagnosed? This condition is diagnosed with a blood test. How is this treated? Hypokalemia can be treated by taking potassium supplements by mouth or adjusting the medicines that you take. Treatment may also include eating more foods that contain a lot of potassium. If your potassium level is very low, you may need to get potassium through  an IV tube in one of your veins and be monitored in the hospital. Follow these instructions at home:  Take over-the-counter and prescription medicines only as told by your health care provider. This includes vitamins and supplements.  Eat a healthy diet. A healthy diet includes fresh fruits and vegetables, whole grains, healthy fats, and lean proteins.  If instructed, eat more foods that contain a lot of potassium, such  as: ? Nuts, such as peanuts and pistachios. ? Seeds, such as sunflower seeds and pumpkin seeds. ? Peas, lentils, and lima beans. ? Whole grain and bran cereals and breads. ? Fresh fruits and vegetables, such as apricots, avocado, bananas, cantaloupe, kiwi, oranges, tomatoes, asparagus, and potatoes. ? Orange juice. ? Tomato juice. ? Red meats. ? Yogurt.  Keep all follow-up visits as told by your health care provider. This is important. Contact a health care provider if:  You have weakness that gets worse.  You feel your heart pounding or racing.  You vomit.  You have diarrhea.  You have diabetes (diabetes mellitus) and you have trouble keeping your blood sugar (glucose) in your target range. Get help right away if:  You have chest pain.  You have shortness of breath.  You have vomiting or diarrhea that lasts for more than 2 days.  You faint. This information is not intended to replace advice given to you by your health care provider. Make sure you discuss any questions you have with your health care provider. Document Released: 03/10/2005 Document Revised: 10/27/2015 Document Reviewed: 10/27/2015 Elsevier Interactive Patient Education  2018 [employer].

## 2016-08-27 LAB — CBC WITH DIFFERENTIAL/PLATELET
Basophils Absolute: 0 10*3/uL (ref 0.0–0.2)
Basos: 1 %
EOS (ABSOLUTE): 0 10*3/uL (ref 0.0–0.4)
Eos: 1 %
Hematocrit: 34.4 % (ref 34.0–46.6)
Hemoglobin: 11.2 g/dL (ref 11.1–15.9)
Immature Grans (Abs): 0 10*3/uL (ref 0.0–0.1)
Immature Granulocytes: 0 %
Lymphocytes Absolute: 1.3 10*3/uL (ref 0.7–3.1)
Lymphs: 30 %
MCH: 30 pg (ref 26.6–33.0)
MCHC: 32.6 g/dL (ref 31.5–35.7)
MCV: 92 fL (ref 79–97)
Monocytes Absolute: 0.4 10*3/uL (ref 0.1–0.9)
Monocytes: 9 %
Neutrophils Absolute: 2.5 10*3/uL (ref 1.4–7.0)
Neutrophils: 59 %
Platelets: 262 10*3/uL (ref 150–379)
RBC: 3.73 x10E6/uL — ABNORMAL LOW (ref 3.77–5.28)
RDW: 14.4 % (ref 12.3–15.4)
WBC: 4.2 10*3/uL (ref 3.4–10.8)

## 2016-08-27 LAB — COMPREHENSIVE METABOLIC PANEL
ALT: 14 IU/L (ref 0–32)
AST: 19 IU/L (ref 0–40)
Albumin/Globulin Ratio: 1.4 (ref 1.2–2.2)
Albumin: 4.7 g/dL (ref 3.5–4.8)
Alkaline Phosphatase: 56 IU/L (ref 39–117)
BUN/Creatinine Ratio: 18 (ref 12–28)
BUN: 15 mg/dL (ref 8–27)
Bilirubin Total: 0.3 mg/dL (ref 0.0–1.2)
CO2: 26 mmol/L (ref 18–29)
Calcium: 9.5 mg/dL (ref 8.7–10.3)
Chloride: 103 mmol/L (ref 96–106)
Creatinine, Ser: 0.85 mg/dL (ref 0.57–1.00)
GFR calc Af Amer: 80 mL/min/{1.73_m2} (ref >59)
GFR calc non Af Amer: 70 mL/min/{1.73_m2} (ref >59)
Globulin, Total: 3.4 g/dL (ref 1.5–4.5)
Glucose: 91 mg/dL (ref 65–99)
Potassium: 4.3 mmol/L (ref 3.5–5.2)
Sodium: 144 mmol/L (ref 134–144)
Total Protein: 8.1 g/dL (ref 6.0–8.5)

## 2016-08-28 ENCOUNTER — Telehealth: Payer: Self-pay

## 2016-08-28 ENCOUNTER — Other Ambulatory Visit: Payer: Self-pay | Admitting: Family Medicine

## 2016-08-28 MED ORDER — VORTIOXETINE HBR 10 MG PO TABS: 1.0000 | ORAL_TABLET | Freq: Every day | ORAL | 3 refills | Status: DC

## 2016-08-28 NOTE — Telephone Encounter (Signed)
Advised patient as below. Medication was sent into the pharmacy. Citalopram was discontinued off of med list.

## 2016-08-28 NOTE — Telephone Encounter (Signed)
-----   Message from [name], [place] sent at 08/28/2016  7:44 AM EDT ----- Blood tests are all normal. Potassium back to normal and Hgb back to normal. Don't need any more potassium. May change Citalopram to Trintellix 10 mg qd #30 and recheck BP in 3 weeks.

## 2016-08-28 NOTE — Telephone Encounter (Signed)
LMTCB

## 2016-09-22 ENCOUNTER — Other Ambulatory Visit: Payer: Self-pay | Admitting: Family Medicine

## 2016-09-23 NOTE — Telephone Encounter (Signed)
RX called in at [facility] pharmacy  

## 2016-09-23 NOTE — Telephone Encounter (Signed)
Please phone in refill as in medication list.

## 2016-10-03 ENCOUNTER — Other Ambulatory Visit: Payer: Medicare HMO

## 2016-10-03 ENCOUNTER — Ambulatory Visit: Payer: Medicare HMO | Admitting: Hematology and Oncology

## 2016-10-22 ENCOUNTER — Other Ambulatory Visit: Payer: Self-pay | Admitting: Family Medicine

## 2016-10-22 ENCOUNTER — Other Ambulatory Visit: Payer: Self-pay | Admitting: Oncology

## 2016-10-23 ENCOUNTER — Other Ambulatory Visit: Payer: Self-pay | Admitting: Family Medicine

## 2016-10-23 ENCOUNTER — Ambulatory Visit: Payer: Medicare HMO | Admitting: Family Medicine

## 2016-10-23 NOTE — Telephone Encounter (Signed)
RX called in at [facility]. Patient has a OV scheduled for today.

## 2016-10-23 NOTE — Telephone Encounter (Signed)
Phone in refill and remind patient we need to schedule follow up appointment to recheck BP and effect of Trintellix.

## 2016-10-28 ENCOUNTER — Ambulatory Visit (INDEPENDENT_AMBULATORY_CARE_PROVIDER_SITE_OTHER): Payer: Medicare HMO | Admitting: Family Medicine

## 2016-10-28 ENCOUNTER — Encounter: Payer: Self-pay | Admitting: Family Medicine

## 2016-10-28 VITALS — BP 134/96 | HR 68 | Temp 98.1°F | Resp 18 | Wt 175.0 lb

## 2016-10-28 DIAGNOSIS — R51 Headache: Secondary | ICD-10-CM | POA: Diagnosis not present

## 2016-10-28 DIAGNOSIS — J0120 Acute ethmoidal sinusitis, unspecified: Secondary | ICD-10-CM | POA: Diagnosis not present

## 2016-10-28 DIAGNOSIS — R519 Headache, unspecified: Secondary | ICD-10-CM

## 2016-10-28 DIAGNOSIS — F339 Major depressive disorder, recurrent, unspecified: Secondary | ICD-10-CM | POA: Diagnosis not present

## 2016-10-28 MED ORDER — AZITHROMYCIN 250 MG PO TABS: ORAL_TABLET | ORAL | 0 refills | Status: DC

## 2016-10-28 MED ORDER — PREDNISONE 5 MG PO TABS: 5.0000 mg | ORAL_TABLET | Freq: Every day | ORAL | 0 refills | Status: DC

## 2016-10-28 NOTE — Progress Notes (Signed)
Melissa Fitzgerald  MRN: [id number] DOB: Oct 10, 1945  Subjective:  HPI   Patient states she has been having trouble with her sinuses for 1 week. Symptoms are headache, head pressure, sinus pain and pressure, right ear pain, nasal congestion, drainage, slight cough-states no more than normal, chest congestion, has felt hot but did not check her temperature. She had Amoxicillin left over and took 3 tablets and states it helped break up some of the congestion.  Patient also states she stopped taking Trintellix due to developing severe itching. She wants to discuss trying something different. Her depression is not better.  Patient Active Problem List   Diagnosis Date Noted  . History of colon cancer 03/22/2015  . 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 Anemia, iron deficiency 10/23/2014  . HLD (hyperlipidemia) 10/23/2014  . Essential (primary) hypertension 10/23/2014  . Acid reflux 10/23/2014  . Clinical depression 10/23/2014  . Peptic ulcer 10/23/2014  . Hypothyroidism, postop 10/23/2014  . Anemia 10/18/2014  . Arthritis 10/18/2014  . Chronic constipation 10/18/2014  . Sleep disturbance 10/18/2014  . Elevated liver enzymes 10/18/2014  . Chronic headache 10/18/2014  . History of renal calculi 10/18/2014  . Malignant neoplasm of cecum ([place]) 10/17/2013    Past Medical History:  Diagnosis Date  . Anemia   . Colon polyp   . Depression   . Hyperlipidemia   . Hypertension   . Irritable bowel disease   . Kidney stone   . Thyroid disease     Social History   Social History  . Marital status: Widowed    Spouse name: N/A  . Number of children: N/A  . Years of education: N/A   Occupational History  . Not on file.   Social History Main Topics  . Smoking status: Current Some Day Smoker    Packs/day: 0.00    Types: Cigarettes  . Smokeless tobacco: Never Used     Comment: still smoking every now and then, rarely one  .  Alcohol use 0.0 oz/week     Comment: occasionally  . Drug use: No  . Sexual activity: Not on file   Other Topics Concern  . Not on file   Social History Narrative  . No narrative on file    Outpatient Encounter Prescriptions as of 10/28/2016  Medication Sig  . carboxymethylcellulose (REFRESH PLUS) 0.5 % SOLN 1 drop daily as needed.  . diphenoxylate-atropine (LOMOTIL) 2.5-0.025 MG tablet TAKE ONE TABLET BY MOUTH THREE TIMES DAILY AS NEEDED FOR DIARRHEA OR LOOSE STOOLS (Patient not taking: Reported on 07/04/2016)  . Ferrous Fumarate 324 (106 FE) MG TABS Take 1 tablet by mouth daily. (Patient not taking: Reported on 07/04/2016)  . levothyroxine (SYNTHROID, LEVOTHROID) 100 MCG tablet TAKE ONE TABLET BY MOUTH ONCE DAILY BEFORE BREAKFAST  . meclizine (ANTIVERT) 25 MG tablet Take 1 tablet (25 mg total) by mouth 4 (four) times daily as needed for dizziness. Nausea or anxiety.  . metoprolol succinate (TOPROL-XL) 50 MG 24 hr tablet TAKE ONE TABLET BY MOUTH ONCE DAILY  . potassium chloride (K-DUR) 10 MEQ tablet Take 1 tablet (10 mEq total) by mouth daily.  Marland Kitchen vortioxetine HBr (TRINTELLIX) 10 MG TABS Take 1 tablet (10 mg total) by mouth daily. (Patient not taking: Reported on 10/28/2016)  . zolpidem (AMBIEN) 5 MG tablet TAKE 1 TABLET BY MOUTH AT BEDTIME AS NEEDED FOR SLEEP   No facility-administered encounter medications on file as of 10/28/2016.  Allergies  Allergen Reactions  . Codeine Nausea And Vomiting  . Oxycodone Nausea And Vomiting  . Penicillins Nausea And Vomiting  . Tramadol Nausea And Vomiting    Review of Systems  Constitutional: Positive for malaise/fatigue.  HENT: Positive for congestion, ear pain, sinus pain and tinnitus.   Respiratory: Positive for cough.   Cardiovascular: Negative.   Musculoskeletal: Negative.   Skin: Positive for itching.  Neurological: Positive for dizziness and headaches.  Psychiatric/Behavioral: Positive for depression. The patient has insomnia.       Objective:  BP (!) 134/96   Pulse 68   Temp 98.1 F (36.7 C)   Resp 18   Wt 175 lb (79.4 kg)   SpO2 98%   BMI 28.25 kg/m  BP Readings from Last 3 Encounters:  10/28/16 (!) 134/96  08/26/16 (!) 158/98  08/15/16 119/80   Physical Exam  Constitutional: She is oriented to person, place, and time and well-developed, well-nourished, and in no distress.  HENT:  Head: Normocephalic.  Right Ear: External ear normal.  Left Ear: External ear normal.  Nose: Nose normal.  Mouth/Throat: Oropharynx is clear and moist.  Slight tenderness in ethmoid region.  Eyes: Conjunctivae are normal.  Neck: Neck supple.  Cardiovascular: Normal rate and regular rhythm.   Pulmonary/Chest: Effort normal and breath sounds normal.  Slightly cloudy left maxillary.  Abdominal: Soft.  Lymphadenopathy:    She has no cervical adenopathy.  Neurological: She is alert and oriented to person, place, and time.  Skin: No rash noted.  Psychiatric: Affect and judgment normal.    Assessment and Plan :  1. Subacute ethmoidal sinusitis Onset over the past week with sinus pressure behind nose. Tender to palpate but only slightly cloudy transillumination. No fever or cough. May use Mucinex prn and add steroid taper with Z-pak for infection. Recheck prn. - azithromycin (ZITHROMAX) 250 MG tablet; Take two tablets by mouth today then one daily for 4 days.  Dispense: 6 tablet; Refill: 0 - predniSONE (DELTASONE) 5 MG tablet; Take 1 tablet (5 mg total) by mouth daily with breakfast. Taper down by one tablet daily (6,5,4,3,2,1)  Dispense: 21 tablet; Refill: 0  2. Nonintractable headache, unspecified chronicity pattern, unspecified headache type Pounding pressure headache with sinus congestion. Some sneezing and itching. Will treat with Prednisone taper and may add Tylenol or Excedrin prn. Increase fluid intake. Recheck prn. - predniSONE (DELTASONE) 5 MG tablet; Take 1 tablet (5 mg total) by mouth daily with breakfast. Taper  down by one tablet daily (6,5,4,3,2,1)  Dispense: 21 tablet; Refill: 0  3. Recurrent major depressive disorder, remission status unspecified ([place]) Feeling mood is more stable with less depressive symptoms. Had to stop the Trintellix due to itching. May use Benadryl and stop the Trintellix. Does not want any other antidepressants until this clears her system. Recheck if any breathing difficulty or go to ER.

## 2016-11-14 ENCOUNTER — Other Ambulatory Visit: Payer: Self-pay | Admitting: Family Medicine

## 2016-11-14 NOTE — Telephone Encounter (Signed)
[facility] patient. Last OV 10/28/16 Last RF 10/23/16 for 15 tablets

## 2016-11-14 NOTE — Telephone Encounter (Signed)
RX called in at [facility] pharmacy  

## 2016-12-08 ENCOUNTER — Ambulatory Visit (INDEPENDENT_AMBULATORY_CARE_PROVIDER_SITE_OTHER): Payer: Medicare HMO | Admitting: Family Medicine

## 2016-12-08 ENCOUNTER — Encounter: Payer: Self-pay | Admitting: Family Medicine

## 2016-12-08 VITALS — BP 160/100 | HR 64 | Temp 98.0°F | Resp 16 | Ht 66.0 in | Wt 179.6 lb

## 2016-12-08 DIAGNOSIS — F339 Major depressive disorder, recurrent, unspecified: Secondary | ICD-10-CM | POA: Diagnosis not present

## 2016-12-08 DIAGNOSIS — E890 Postprocedural hypothyroidism: Secondary | ICD-10-CM

## 2016-12-08 DIAGNOSIS — I10 Essential (primary) hypertension: Secondary | ICD-10-CM

## 2016-12-08 NOTE — Progress Notes (Signed)
Patient: Melissa Fitzgerald Female    DOB: 04-15-1945   71 y.o.   MRN: [id number] Visit Date: 12/08/2016  Today's Provider: Vernie Murders, PA   Chief Complaint  Patient presents with  . Depression   Subjective:    HPI  Depression, Follow-up  She  was last seen for this 1 months ago. Changes made at last visit include D/C trintellix due to side effect (itching).   Current symptoms include: depressed mood. Patient reports symptoms are worse this week due to weather. She feels she is Worse since last visit. ------------------------------------------------------------------------  Hypertension, follow-up:  BP Readings from Last 3 Encounters:  12/08/16 (!) 160/100  10/28/16 (!) 134/96  08/26/16 (!) 158/98    She was last seen for hypertension 1 months ago.  BP at that visit was 134/96. Management changes since that visit include no changes. She reports excellent compliance with treatment. She is not having side effects.  She is not exercising. She is adherent to low salt diet.   Outside blood pressures are not being check. She is experiencing none.  Patient denies chest pain.   Cardiovascular risk factors include advanced age (older than 38 for men, 83 for women) and hypertension.  Use of agents associated with hypertension: none.     Weight trend: stable Wt Readings from Last 3 Encounters:  12/08/16 179 lb 9.6 oz (81.5 kg)  10/28/16 175 lb (79.4 kg)  08/26/16 174 lb (78.9 kg)    Current diet: in general, a "healthy" diet    ------------------------------------------------------------------------   Past Medical History:  Diagnosis Date  . Anemia   . Colon polyp   . Depression   . Hyperlipidemia   . Hypertension   . Irritable bowel disease   . Kidney stone   . Thyroid disease    Past Surgical History:  Procedure Laterality Date  . ABDOMINAL HYSTERECTOMY    . BREAST ENHANCEMENT SURGERY    . CHOLECYSTECTOMY  2000  . COLECTOMY  09/2013  .  COLONOSCOPY  09-13-13   Dr [name]  . COLONOSCOPY WITH PROPOFOL N/A 10/18/2014   Procedure: COLONOSCOPY WITH PROPOFOL;  Surgeon: [name], MD;  Location: [facility] ENDOSCOPY;  Service: Endoscopy;  Laterality: N/A;  . ESOPHAGOGASTRODUODENOSCOPY (EGD) WITH PROPOFOL N/A 01/03/2015   Procedure: ESOPHAGOGASTRODUODENOSCOPY (EGD) WITH PROPOFOL;  Surgeon: [name], MD;  Location: [facility] ENDOSCOPY;  Service: Endoscopy;  Laterality: N/A;  . FOOT SURGERY    . KIDNEY STONE SURGERY    . NASAL SINUS SURGERY    . PORTACATH PLACEMENT  10/24/13  . THYROID SURGERY    . TONSILLECTOMY     Family History  Problem Relation Age of Onset  . Heart disease Mother   . Diabetes Mother   . Arthritis Sister   . Hyperlipidemia Sister   . COPD Brother   . Diabetes Sister   . Colon cancer Unknown        first cousin  . Breast cancer Maternal Aunt   . Breast cancer Paternal Aunt    Allergies  Allergen Reactions  . Codeine Nausea And Vomiting  . Oxycodone Nausea And Vomiting  . Penicillins Nausea And Vomiting  . Tramadol Nausea And Vomiting  . Trintellix [Vortioxetine] Itching     Current Outpatient Prescriptions:  .  carboxymethylcellulose (REFRESH PLUS) 0.5 % SOLN, 1 drop daily as needed., Disp: , Rfl:  .  diphenoxylate-atropine (LOMOTIL) 2.5-0.025 MG tablet, TAKE ONE TABLET BY MOUTH THREE TIMES DAILY AS NEEDED FOR DIARRHEA OR  LOOSE STOOLS, Disp: 21 tablet, Rfl: 0 .  Ferrous Fumarate 324 (106 FE) MG TABS, Take 1 tablet by mouth daily., Disp: 30 tablet, Rfl: 3 .  levothyroxine (SYNTHROID, LEVOTHROID) 100 MCG tablet, TAKE ONE TABLET BY MOUTH ONCE DAILY BEFORE BREAKFAST, Disp: 30 tablet, Rfl: 6 .  meclizine (ANTIVERT) 25 MG tablet, Take 1 tablet (25 mg total) by mouth 4 (four) times daily as needed for dizziness. Nausea or anxiety., Disp: 30 tablet, Rfl: 3 .  metoprolol succinate (TOPROL-XL) 50 MG 24 hr tablet, TAKE ONE TABLET BY MOUTH ONCE DAILY, Disp: 90 tablet, Rfl: 3 .  ondansetron (ZOFRAN)  4 MG tablet, TAKE ONE TABLET BY MOUTH EVERY 6 HOURS AS NEEDED FOR CHEMOTHERAPY INDUCED NAUSEA AND VOMINTING, Disp: 60 tablet, Rfl: 0 .  zolpidem (AMBIEN) 5 MG tablet, TAKE 1 TABLET BY MOUTH AT BEDTIME AS NEEDED FOR SLEEP, Disp: 15 tablet, Rfl: 0 .  potassium chloride (K-DUR) 10 MEQ tablet, Take 1 tablet (10 mEq total) by mouth daily. (Patient not taking: Reported on 12/08/2016), Disp: 5 tablet, Rfl: 0  Review of Systems  Constitutional: Positive for fatigue.  Cardiovascular: Negative.   Psychiatric/Behavioral: Positive for decreased concentration. The patient is nervous/anxious.     Social History  Substance Use Topics  . Smoking status: Current Some Day Smoker    Packs/day: 0.00    Types: Cigarettes  . Smokeless tobacco: Never Used     Comment: still smoking every now and then, rarely one  . Alcohol use 0.0 oz/week     Comment: occasionally   Objective:   BP (!) 160/100 (BP Location: Right Leg, Patient Position: Sitting, Cuff Size: Large)   Pulse 64   Temp 98 F (36.7 C) (Oral)   Resp 16   Ht 5\' 6"  (1.676 m)   Wt 179 lb 9.6 oz (81.5 kg)   SpO2 98%   BMI 28.99 kg/m  Vitals:   12/08/16 1329  BP: (!) 160/100  Pulse: 64  Resp: 16  Temp: 98 F (36.7 C)  TempSrc: Oral  SpO2: 98%  Weight: 179 lb 9.6 oz (81.5 kg)  Height: 5\' 6"  (1.676 m)     Physical Exam  Constitutional: She is oriented to person, place, and time. She appears well-developed and well-nourished. No distress.  HENT:  Head: Normocephalic and atraumatic.  Right Ear: Hearing normal.  Left Ear: Hearing normal.  Nose: Nose normal.  Eyes: Conjunctivae and lids are normal. Right eye exhibits no discharge. Left eye exhibits no discharge. No scleral icterus.  Neck: Neck supple. No thyromegaly present.  Cardiovascular: Normal rate.   Pulmonary/Chest: Effort normal and breath sounds normal. No respiratory distress.  Abdominal: Soft. Bowel sounds are normal.  Musculoskeletal: Normal range of motion.  Neurological:  She is alert and oriented to person, place, and time.  Skin: Skin is intact. No lesion and no rash noted.  Psychiatric: Her speech is normal and behavior is normal. Thought content normal. Her affect is blunt. Cognition and memory are normal. She expresses no homicidal and no suicidal ideation. She expresses no suicidal plans and no homicidal plans.      Assessment & Plan:     1. Recurrent major depressive disorder, remission status unspecified ([place]) Still feeling sad but denies suicidal ideation. Rare crying spontaneously. Itching from Trintellix has stopped and no longer using Benadryl. Still uses Zolpidem intermittently when not sleeping well. Denies caffeine consumption. Check labs and follow up hypothyroidism. - CBC with Differential/Platelet - Comprehensive metabolic panel - TSH - T4  2.  Essential (primary) hypertension Elevated BP today. Denies use of much salt and still taking Metoprolol-XL 50 mg qd. Will check labs for metabolic disorder and may need to add HCTZ. - CBC with Differential/Platelet - Comprehensive metabolic panel - TSH - T4  3. Hypothyroidism, postop Still taking Levothyroxine 100 mcg qd since Dr. 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 Will recheck CBC, CMP.T4 and TSH/       Vernie Murders, PA  [facility]

## 2016-12-09 DIAGNOSIS — I10 Essential (primary) hypertension: Secondary | ICD-10-CM | POA: Diagnosis not present

## 2016-12-09 DIAGNOSIS — F339 Major depressive disorder, recurrent, unspecified: Secondary | ICD-10-CM | POA: Diagnosis not present

## 2016-12-09 LAB — COMPREHENSIVE METABOLIC PANEL
AG Ratio: 1.3 (calc) (ref 1.0–2.5)
ALT: 13 U/L (ref 6–29)
AST: 17 U/L (ref 10–35)
Albumin: 4 g/dL (ref 3.6–5.1)
Alkaline phosphatase (APISO): 60 U/L (ref 33–130)
BUN: 13 mg/dL (ref 7–25)
CO2: 29 mmol/L (ref 20–32)
Calcium: 9 mg/dL (ref 8.6–10.4)
Chloride: 105 mmol/L (ref 98–110)
Creat: 0.77 mg/dL (ref 0.60–0.93)
Globulin: 3.2 g/dL (calc) (ref 1.9–3.7)
Glucose, Bld: 92 mg/dL (ref 65–99)
Potassium: 3.3 mmol/L — ABNORMAL LOW (ref 3.5–5.3)
Sodium: 142 mmol/L (ref 135–146)
Total Bilirubin: 0.4 mg/dL (ref 0.2–1.2)
Total Protein: 7.2 g/dL (ref 6.1–8.1)

## 2016-12-09 LAB — CBC WITH DIFFERENTIAL/PLATELET
Basophils Absolute: 20 cells/uL (ref 0–200)
Basophils Relative: 0.6 %
Eosinophils Absolute: 10 cells/uL — ABNORMAL LOW (ref 15–500)
Eosinophils Relative: 0.3 %
HCT: 32.8 % — ABNORMAL LOW (ref 35.0–45.0)
Hemoglobin: 10.5 g/dL — ABNORMAL LOW (ref 11.7–15.5)
Lymphs Abs: 898 cells/uL (ref 850–3900)
MCH: 29.3 pg (ref 27.0–33.0)
MCHC: 32 g/dL (ref 32.0–36.0)
MCV: 91.6 fL (ref 80.0–100.0)
MPV: 10.7 fL (ref 7.5–12.5)
Monocytes Relative: 8.9 %
Neutro Abs: 2079 cells/uL (ref 1500–7800)
Neutrophils Relative %: 63 %
Platelets: 262 10*3/uL (ref 140–400)
RBC: 3.58 10*6/uL — ABNORMAL LOW (ref 3.80–5.10)
RDW: 14.1 % (ref 11.0–15.0)
Total Lymphocyte: 27.2 %
WBC mixed population: 294 cells/uL (ref 200–950)
WBC: 3.3 10*3/uL — ABNORMAL LOW (ref 3.8–10.8)

## 2016-12-09 LAB — T4: T4, Total: 10.2 ug/dL (ref 5.1–11.9)

## 2016-12-09 LAB — TSH: TSH: 0.04 mIU/L — ABNORMAL LOW (ref 0.40–4.50)

## 2016-12-10 ENCOUNTER — Telehealth: Payer: Self-pay

## 2016-12-10 MED ORDER — POTASSIUM CHLORIDE CRYS ER 20 MEQ PO TBCR: 20.0000 meq | EXTENDED_RELEASE_TABLET | Freq: Every day | ORAL | 0 refills | Status: DC

## 2016-12-10 NOTE — Telephone Encounter (Signed)
-----   Message from [name], [place] sent at 12/09/2016  5:21 PM EDT ----- Preliminary lab report shows potassium low and needs Klorcon 20 meq qd #20 then recheck level in 3 weeks. Awaiting final CBC report.

## 2016-12-10 NOTE — Telephone Encounter (Signed)
NA. sd 

## 2016-12-10 NOTE — Telephone Encounter (Signed)
Patient requesting update on blood pressure medication also. sd

## 2016-12-10 NOTE — Telephone Encounter (Signed)
Patient advised as below. Patient verbalizes understanding and is in agreement with treatment plan.  Patient requesting update on depression medication.

## 2016-12-11 ENCOUNTER — Other Ambulatory Visit: Payer: Self-pay | Admitting: Family Medicine

## 2016-12-11 MED ORDER — SERTRALINE HCL 50 MG PO TABS: 50.0000 mg | ORAL_TABLET | Freq: Every day | ORAL | 3 refills | Status: DC

## 2016-12-11 NOTE — Telephone Encounter (Signed)
Patient advised. RX sent to [facility]. 4 week follow up scheduled.

## 2016-12-11 NOTE — Telephone Encounter (Signed)
-----   Message from [name], [place] sent at 12/11/2016  8:21 AM EDT ----- CBC showed RBC's and Hgb/Hct low again. Continue iron supplement daily and recheck levels in 2-3 months. Follow instructions to increase potassium level with the Klorcon 20 meq as on CMP note. Increase Metoprolol 50 mg to 1.5 tablet daily and add Sertraline 50 mg qd #30 & 3RF. Recheck BP in 3-4 weeks to check progress.

## 2016-12-19 ENCOUNTER — Other Ambulatory Visit: Payer: Self-pay | Admitting: Family Medicine

## 2016-12-22 NOTE — Telephone Encounter (Signed)
RX called in at [facility] pharmacy  

## 2016-12-22 NOTE — Telephone Encounter (Signed)
Please phone in refill as authorized in medication list.

## 2017-01-05 ENCOUNTER — Encounter: Payer: Self-pay | Admitting: Family Medicine

## 2017-01-09 ENCOUNTER — Ambulatory Visit: Payer: Medicare HMO | Admitting: Family Medicine

## 2017-01-19 ENCOUNTER — Ambulatory Visit: Payer: Medicare HMO | Admitting: Family Medicine

## 2017-01-26 ENCOUNTER — Other Ambulatory Visit: Payer: Self-pay | Admitting: Family Medicine

## 2017-02-11 ENCOUNTER — Other Ambulatory Visit: Payer: Self-pay

## 2017-02-11 DIAGNOSIS — I10 Essential (primary) hypertension: Secondary | ICD-10-CM

## 2017-02-11 MED ORDER — LEVOTHYROXINE SODIUM 100 MCG PO TABS: ORAL_TABLET | ORAL | 3 refills | Status: DC

## 2017-02-11 MED ORDER — SERTRALINE HCL 50 MG PO TABS
50.0000 mg | ORAL_TABLET | Freq: Every day | ORAL | 3 refills | Status: DC
Start: 2017-02-11 — End: 2017-05-26

## 2017-02-11 MED ORDER — METOPROLOL SUCCINATE ER 50 MG PO TB24: 50.0000 mg | ORAL_TABLET | Freq: Every day | ORAL | 3 refills | Status: DC

## 2017-02-17 ENCOUNTER — Other Ambulatory Visit: Payer: Self-pay | Admitting: Family Medicine

## 2017-02-17 MED ORDER — ZOLPIDEM TARTRATE 5 MG PO TABS: 5.0000 mg | ORAL_TABLET | Freq: Every evening | ORAL | 1 refills | Status: DC | PRN

## 2017-02-17 NOTE — Telephone Encounter (Signed)
Phone in refill, please.

## 2017-02-17 NOTE — Telephone Encounter (Signed)
RX called in at [facility] mail order

## 2017-02-17 NOTE — Telephone Encounter (Signed)
[facility] pharmacy faxed a request for the following medication. Thanks CC  zolpidem (AMBIEN) 5 MG tablet

## 2017-02-24 ENCOUNTER — Other Ambulatory Visit: Payer: Self-pay | Admitting: Family Medicine

## 2017-02-24 NOTE — Telephone Encounter (Signed)
[facility] pharmacy faxed a refill request for the following medication. Thanks CC   zolpidem (AMBIEN) 5 MG tablet

## 2017-02-24 NOTE — Telephone Encounter (Signed)
RX called in at [facility] on 02/17/17

## 2017-02-26 ENCOUNTER — Other Ambulatory Visit: Payer: Self-pay | Admitting: Physician Assistant

## 2017-02-26 DIAGNOSIS — I10 Essential (primary) hypertension: Secondary | ICD-10-CM

## 2017-02-26 NOTE — Telephone Encounter (Signed)
LMTCB

## 2017-02-26 NOTE — Telephone Encounter (Signed)
Pt has received all her medications through the mail.  She said this was correct.  Thanks [employer]

## 2017-02-26 NOTE — Telephone Encounter (Signed)
Refilled for a year on 02-11-17 at the [facility]. Was this the wrong pharmacy or does she only a short supply locally to last until the mail order comes to her?

## 2017-02-27 ENCOUNTER — Other Ambulatory Visit: Payer: Self-pay | Admitting: Family Medicine

## 2017-02-27 DIAGNOSIS — I10 Essential (primary) hypertension: Secondary | ICD-10-CM

## 2017-02-27 MED ORDER — METOPROLOL SUCCINATE ER 50 MG PO TB24: 50.0000 mg | ORAL_TABLET | Freq: Every day | ORAL | 0 refills | Status: DC

## 2017-02-27 NOTE — Telephone Encounter (Signed)
Sent a month supply to carry her over until the mail order prescription comes to her.

## 2017-05-18 ENCOUNTER — Ambulatory Visit: Payer: Medicare HMO | Admitting: Family Medicine

## 2017-05-18 NOTE — Progress Notes (Deleted)
       Patient: Melissa Fitzgerald Female    DOB: 1945-07-21   72 y.o.   MRN: [id number] Visit Date: 05/18/2017  Today's Provider: Vernie Murders, PA   No chief complaint on file.  Subjective:    HPI     Allergies  Allergen Reactions  . Codeine Nausea And Vomiting  . Oxycodone Nausea And Vomiting  . Penicillins Nausea And Vomiting  . Tramadol Nausea And Vomiting  . Trintellix [Vortioxetine] Itching     Current Outpatient Medications:  .  carboxymethylcellulose (REFRESH PLUS) 0.5 % SOLN, 1 drop daily as needed., Disp: , Rfl:  .  diphenoxylate-atropine (LOMOTIL) 2.5-0.025 MG tablet, TAKE ONE TABLET BY MOUTH THREE TIMES DAILY AS NEEDED FOR DIARRHEA OR LOOSE STOOLS, Disp: 21 tablet, Rfl: 0 .  Ferrous Fumarate 324 (106 FE) MG TABS, Take 1 tablet by mouth daily., Disp: 30 tablet, Rfl: 3 .  levothyroxine (SYNTHROID, LEVOTHROID) 100 MCG tablet, TAKE ONE TABLET BY MOUTH ONCE DAILY BEFORE BREAKFAST, Disp: 90 tablet, Rfl: 3 .  meclizine (ANTIVERT) 25 MG tablet, Take 1 tablet (25 mg total) by mouth 4 (four) times daily as needed for dizziness. Nausea or anxiety., Disp: 30 tablet, Rfl: 3 .  metoprolol succinate (TOPROL-XL) 50 MG 24 hr tablet, Take 1 tablet (50 mg total) by mouth daily. Take with or immediately following a meal., Disp: 30 tablet, Rfl: 0 .  ondansetron (ZOFRAN) 4 MG tablet, TAKE ONE TABLET BY MOUTH EVERY 6 HOURS AS NEEDED FOR CHEMOTHERAPY INDUCED NAUSEA AND VOMINTING, Disp: 60 tablet, Rfl: 0 .  potassium chloride SA (K-DUR,KLOR-CON) 20 MEQ tablet, Take 1 tablet (20 mEq total) by mouth daily., Disp: 20 tablet, Rfl: 0 .  sertraline (ZOLOFT) 50 MG tablet, Take 1 tablet (50 mg total) by mouth daily., Disp: 90 tablet, Rfl: 3 .  zolpidem (AMBIEN) 5 MG tablet, Take 1 tablet (5 mg total) by mouth at bedtime as needed. for sleep, Disp: 15 tablet, Rfl: 1  Review of Systems  Constitutional: Negative.   Respiratory: Negative.   Cardiovascular: Negative.   Gastrointestinal: Negative.     Genitourinary: Negative.     Social History   Tobacco Use  . Smoking status: Current Some Day Smoker    Packs/day: 0.00    Types: Cigarettes  . Smokeless tobacco: Never Used  . Tobacco comment: still smoking every now and then, rarely one  Substance Use Topics  . Alcohol use: Yes    Alcohol/week: 0.0 oz    Comment: occasionally   Objective:   There were no vitals taken for this visit.   Physical Exam      Assessment & Plan:           Vernie Murders, PA  [facility]

## 2017-05-26 ENCOUNTER — Ambulatory Visit: Payer: Medicare PPO | Admitting: Family Medicine

## 2017-05-26 ENCOUNTER — Encounter: Payer: Self-pay | Admitting: Family Medicine

## 2017-05-26 VITALS — BP 148/94 | HR 67 | Temp 98.4°F | Wt 180.6 lb

## 2017-05-26 DIAGNOSIS — D509 Iron deficiency anemia, unspecified: Secondary | ICD-10-CM

## 2017-05-26 DIAGNOSIS — Z85038 Personal history of other malignant neoplasm of large intestine: Secondary | ICD-10-CM | POA: Diagnosis not present

## 2017-05-26 DIAGNOSIS — G479 Sleep disorder, unspecified: Secondary | ICD-10-CM

## 2017-05-26 DIAGNOSIS — G8929 Other chronic pain: Secondary | ICD-10-CM

## 2017-05-26 DIAGNOSIS — R519 Headache, unspecified: Secondary | ICD-10-CM

## 2017-05-26 DIAGNOSIS — F3340 Major depressive disorder, recurrent, in remission, unspecified: Secondary | ICD-10-CM

## 2017-05-26 DIAGNOSIS — R51 Headache: Secondary | ICD-10-CM

## 2017-05-26 DIAGNOSIS — I10 Essential (primary) hypertension: Secondary | ICD-10-CM

## 2017-05-26 DIAGNOSIS — E890 Postprocedural hypothyroidism: Secondary | ICD-10-CM

## 2017-05-26 LAB — POCT URINALYSIS DIPSTICK
Bilirubin, UA: NEGATIVE
Blood, UA: NEGATIVE
Glucose, UA: NEGATIVE
Ketones, UA: NEGATIVE
Leukocytes, UA: NEGATIVE
Nitrite, UA: NEGATIVE
Spec Grav, UA: 1.02 (ref 1.010–1.025)
Urobilinogen, UA: 0.2 E.U./dL
pH, UA: 6 (ref 5.0–8.0)

## 2017-05-26 MED ORDER — METOPROLOL SUCCINATE ER 100 MG PO TB24: 100.0000 mg | ORAL_TABLET | Freq: Every day | ORAL | 3 refills | Status: DC

## 2017-05-26 MED ORDER — SERTRALINE HCL 50 MG PO TABS: 50.0000 mg | ORAL_TABLET | Freq: Every day | ORAL | 3 refills | Status: DC

## 2017-05-26 NOTE — Progress Notes (Signed)
Patient: Melissa Fitzgerald Female    DOB: 22-Oct-1945   72 y.o.   MRN: [id number] Visit Date: 05/26/2017  Today's Provider: Vernie Murders, PA   Chief Complaint  Patient presents with  . Headache   Subjective:    Headache   This is a chronic problem. Episode onset: episode occurred a couple weeks ago. The pain is located in the frontal and temporal region. Radiates to: before ear at times. The pain quality is similar to prior headaches. The quality of the pain is described as dull and aching. Associated symptoms include ear pain. Treatments tried: allergy medications. The treatment provided no relief.  Patient referred to Dr. Domingo Cocking at the [facility] on 07/04/16 for chronic nonintractable headaches. Patient states she did not go for appointment.   Past Medical History:  Diagnosis Date  . Anemia   . Colon polyp   . Depression   . Hyperlipidemia   . Hypertension   . Irritable bowel disease   . Kidney stone   . Thyroid disease    Past Surgical History:  Procedure Laterality Date  . ABDOMINAL HYSTERECTOMY    . BREAST ENHANCEMENT SURGERY    . CHOLECYSTECTOMY  2000  . COLECTOMY  09/2013  . COLONOSCOPY  09-13-13   Dr [name]  . COLONOSCOPY WITH PROPOFOL N/A 10/18/2014   Procedure: COLONOSCOPY WITH PROPOFOL;  Surgeon: [name], MD;  Location: [facility] ENDOSCOPY;  Service: Endoscopy;  Laterality: N/A;  . ESOPHAGOGASTRODUODENOSCOPY (EGD) WITH PROPOFOL N/A 01/03/2015   Procedure: ESOPHAGOGASTRODUODENOSCOPY (EGD) WITH PROPOFOL;  Surgeon: [name], MD;  Location: [facility] ENDOSCOPY;  Service: Endoscopy;  Laterality: N/A;  . FOOT SURGERY    . KIDNEY STONE SURGERY    . NASAL SINUS SURGERY    . PORTACATH PLACEMENT  10/24/13  . THYROID SURGERY    . TONSILLECTOMY     Family History  Problem Relation Age of Onset  . Heart disease Mother   . Diabetes Mother   . Arthritis Sister   . Hyperlipidemia Sister   . COPD Brother   . Diabetes Sister   . Colon  cancer Unknown        first cousin  . Breast cancer Maternal Aunt   . Breast cancer Paternal Aunt    Allergies  Allergen Reactions  . Codeine Nausea And Vomiting  . Oxycodone Nausea And Vomiting  . Penicillins Nausea And Vomiting  . Tramadol Nausea And Vomiting  . Trintellix [Vortioxetine] Itching    Current Outpatient Medications:  .  carboxymethylcellulose (REFRESH PLUS) 0.5 % SOLN, 1 drop daily as needed., Disp: , Rfl:  .  diphenoxylate-atropine (LOMOTIL) 2.5-0.025 MG tablet, TAKE ONE TABLET BY MOUTH THREE TIMES DAILY AS NEEDED FOR DIARRHEA OR LOOSE STOOLS, Disp: 21 tablet, Rfl: 0 .  Ferrous Fumarate 324 (106 FE) MG TABS, Take 1 tablet by mouth daily., Disp: 30 tablet, Rfl: 3 .  levothyroxine (SYNTHROID, LEVOTHROID) 100 MCG tablet, TAKE ONE TABLET BY MOUTH ONCE DAILY BEFORE BREAKFAST, Disp: 90 tablet, Rfl: 3 .  meclizine (ANTIVERT) 25 MG tablet, Take 1 tablet (25 mg total) by mouth 4 (four) times daily as needed for dizziness. Nausea or anxiety., Disp: 30 tablet, Rfl: 3 .  metoprolol succinate (TOPROL-XL) 50 MG 24 hr tablet, Take 1 tablet (50 mg total) by mouth daily. Take with or immediately following a meal., Disp: 30 tablet, Rfl: 0 .  ondansetron (ZOFRAN) 4 MG tablet, TAKE ONE TABLET BY MOUTH EVERY 6 HOURS AS NEEDED FOR  CHEMOTHERAPY INDUCED NAUSEA AND VOMINTING, Disp: 60 tablet, Rfl: 0 .  potassium chloride SA (K-DUR,KLOR-CON) 20 MEQ tablet, Take 1 tablet (20 mEq total) by mouth daily., Disp: 20 tablet, Rfl: 0 .  sertraline (ZOLOFT) 50 MG tablet, Take 1 tablet (50 mg total) by mouth daily., Disp: 90 tablet, Rfl: 3 .  zolpidem (AMBIEN) 5 MG tablet, Take 1 tablet (5 mg total) by mouth at bedtime as needed. for sleep, Disp: 15 tablet, Rfl: 1  Review of Systems  Constitutional: Negative.   HENT: Positive for ear pain.   Respiratory: Negative.   Cardiovascular: Negative.   Neurological: Positive for headaches.   Social History   Tobacco Use  . Smoking status: Current Some Day  Smoker    Packs/day: 0.00    Types: Cigarettes  . Smokeless tobacco: Never Used  . Tobacco comment: still smoking every now and then, rarely one  Substance Use Topics  . Alcohol use: Yes    Alcohol/week: 0.0 oz    Comment: occasionally   Objective:   BP (!) 148/94 (BP Location: Right Arm, Patient Position: Sitting, Cuff Size: Normal)   Pulse 67   Temp 98.4 F (36.9 C) (Oral)   Wt 180 lb 9.6 oz (81.9 kg)   SpO2 97%   BMI 29.15 kg/m  Wt Readings from Last 3 Encounters:  05/26/17 180 lb 9.6 oz (81.9 kg)  12/08/16 179 lb 9.6 oz (81.5 kg)  10/28/16 175 lb (79.4 kg)   Physical Exam  Constitutional: She is oriented to person, place, and time. She appears well-developed and well-nourished. No distress.  HENT:  Head: Normocephalic and atraumatic.  Right Ear: Hearing and external ear normal.  Left Ear: Hearing and external ear normal.  Nose: Nose normal.  Mouth/Throat: Oropharynx is clear and moist.  Eyes: Conjunctivae and lids are normal. Right eye exhibits no discharge. Left eye exhibits no discharge. No scleral icterus.  Neck: No JVD present. No thyromegaly present.  Cardiovascular: Normal rate and regular rhythm.  Pulmonary/Chest: Effort normal and breath sounds normal. No respiratory distress.  Abdominal: Soft. Bowel sounds are normal.  Musculoskeletal: Normal range of motion.  Lymphadenopathy:    She has no cervical adenopathy.  Neurological: She is alert and oriented to person, place, and time. She has normal reflexes.  Skin: Skin is intact. No lesion and no rash noted.  Psychiatric: She has a normal mood and affect. Her speech is normal and behavior is normal. Thought content normal.      Assessment & Plan:     1. Chronic nonintractable headache, unspecified headache type Having frequent tight, dull headache behind her nose. Usually does not have any photosensitivity or nausea with the headaches. Excedrin Migraine has helped in the past to control headaches. She was  scheduled to be evaluated by Dr. Domingo Cocking at the [facility] on 07-05-16 but did not keep the appointment. Wants to reschedule now. Will also get follow up labs to recheck hypokalemia, iron deficiency and evaluate for other metabolic disorders. - CBC with Differential/Platelet - Comprehensive metabolic panel - TSH - Ferritin - Ambulatory referral to Neurology  2. Essential (primary) hypertension BP a little elevated today. Still taking Metoprolol Succinate 50 mg qd. Recheck labs and may need additional BP medication pending reports. - CBC with Differential/Platelet - Comprehensive metabolic panel - Lipid panel - TSH - POCT urinalysis dipstick  3. Hypothyroidism, postop Total thyroidectomy in 2013 by Dr. Pryor Ochoa (ENT) for multiple nodules. No cancer. Still taking Levothyroxine 100 mcg qd. Recheck CBC and  TSH. - CBC with Differential/Platelet - TSH  4. History of colon cancer Had right hemicolectomy for Stage III Colon Cancer on 09-29-13. Last colonoscopy was on 10-18-14 without signs of recurrence. Recheck CBC with history of low hemoglobin and iron deficiency followed by Dr. Mike Gip (oncologist-hematologist) - CBC with Differential/Platelet  5. Sleep disturbance Sleeping fairly well with use of OTC sleep aids and rarely uses Ambien. No daytime sleepiness and no history of significant snoring or apnea. May try Melatonin 3 mg hs with Benadryl and recheck as needed.  6. Recurrent major depressive disorder, in remission ([place]) Feeling well except chronic headache. Feels the Sertraline is helping to control anxiety and depressive reactions. Need refill and will get routine follow up labs. - sertraline (ZOLOFT) 50 MG tablet; Take 1 tablet (50 mg total) by mouth daily.  Dispense: 90 tablet; Refill: 3  7. Iron deficiency anemia, unspecified iron deficiency anemia type Still taking iron supplement and having chronic headache. No hematemesis or melena. Check CBC and Ferritin level. May  need follow up with hematologist. - CBC with Differential/Platelet - Ferritin       Vernie Murders, [place]

## 2017-05-28 ENCOUNTER — Telehealth: Payer: Self-pay

## 2017-05-28 ENCOUNTER — Other Ambulatory Visit: Payer: Self-pay | Admitting: Family Medicine

## 2017-05-28 DIAGNOSIS — F3340 Major depressive disorder, recurrent, in remission, unspecified: Secondary | ICD-10-CM

## 2017-05-28 LAB — LIPID PANEL
Chol/HDL Ratio: 2.8 ratio (ref 0.0–4.4)
Cholesterol, Total: 239 mg/dL — ABNORMAL HIGH (ref 100–199)
HDL: 84 mg/dL (ref >39)
LDL Calculated: 140 mg/dL — ABNORMAL HIGH (ref 0–99)
Triglycerides: 74 mg/dL (ref 0–149)
VLDL Cholesterol Cal: 15 mg/dL (ref 5–40)

## 2017-05-28 LAB — CBC WITH DIFFERENTIAL/PLATELET
Basophils Absolute: 0 10*3/uL (ref 0.0–0.2)
Basos: 1 %
EOS (ABSOLUTE): 0.1 10*3/uL (ref 0.0–0.4)
Eos: 3 %
Hematocrit: 33.6 % — ABNORMAL LOW (ref 34.0–46.6)
Hemoglobin: 10.8 g/dL — ABNORMAL LOW (ref 11.1–15.9)
Immature Grans (Abs): 0 10*3/uL (ref 0.0–0.1)
Immature Granulocytes: 0 %
Lymphocytes Absolute: 1.2 10*3/uL (ref 0.7–3.1)
Lymphs: 28 %
MCH: 29.8 pg (ref 26.6–33.0)
MCHC: 32.1 g/dL (ref 31.5–35.7)
MCV: 93 fL (ref 79–97)
Monocytes Absolute: 0.3 10*3/uL (ref 0.1–0.9)
Monocytes: 8 %
Neutrophils Absolute: 2.5 10*3/uL (ref 1.4–7.0)
Neutrophils: 60 %
Platelets: 244 10*3/uL (ref 150–379)
RBC: 3.62 x10E6/uL — ABNORMAL LOW (ref 3.77–5.28)
RDW: 14.6 % (ref 12.3–15.4)
WBC: 4.1 10*3/uL (ref 3.4–10.8)

## 2017-05-28 LAB — COMPREHENSIVE METABOLIC PANEL
ALT: 11 IU/L (ref 0–32)
AST: 20 IU/L (ref 0–40)
Albumin/Globulin Ratio: 1.3 (ref 1.2–2.2)
Albumin: 4.2 g/dL (ref 3.5–4.8)
Alkaline Phosphatase: 59 IU/L (ref 39–117)
BUN/Creatinine Ratio: 17 (ref 12–28)
BUN: 14 mg/dL (ref 8–27)
Bilirubin Total: 0.3 mg/dL (ref 0.0–1.2)
CO2: 28 mmol/L (ref 20–29)
Calcium: 9.3 mg/dL (ref 8.7–10.3)
Chloride: 102 mmol/L (ref 96–106)
Creatinine, Ser: 0.81 mg/dL (ref 0.57–1.00)
GFR calc Af Amer: 85 mL/min/{1.73_m2} (ref >59)
GFR calc non Af Amer: 73 mL/min/{1.73_m2} (ref >59)
Globulin, Total: 3.3 g/dL (ref 1.5–4.5)
Glucose: 85 mg/dL (ref 65–99)
Potassium: 3.4 mmol/L — ABNORMAL LOW (ref 3.5–5.2)
Sodium: 143 mmol/L (ref 134–144)
Total Protein: 7.5 g/dL (ref 6.0–8.5)

## 2017-05-28 LAB — FERRITIN: Ferritin: 41 ng/mL (ref 15–150)

## 2017-05-28 LAB — TSH: TSH: 2.13 u[IU]/mL (ref 0.450–4.500)

## 2017-05-28 NOTE — Telephone Encounter (Signed)
[employer] faxed a refill request for the following medication. Thanks CC  sertraline (ZOLOFT) 50 MG tablet

## 2017-05-28 NOTE — Telephone Encounter (Signed)
Sent refills for #90 & 3RF to [facility] on 05-26-17. Ask if she changed her mind about where to get this.

## 2017-05-28 NOTE — Telephone Encounter (Signed)
Error

## 2017-05-29 ENCOUNTER — Other Ambulatory Visit: Payer: Self-pay | Admitting: Family Medicine

## 2017-05-29 DIAGNOSIS — F3340 Major depressive disorder, recurrent, in remission, unspecified: Secondary | ICD-10-CM

## 2017-05-29 MED ORDER — SERTRALINE HCL 50 MG PO TABS: 50.0000 mg | ORAL_TABLET | Freq: Every day | ORAL | 3 refills | Status: DC

## 2017-05-29 NOTE — Telephone Encounter (Signed)
Changed prescription to the [facility].

## 2017-05-29 NOTE — Telephone Encounter (Signed)
Patient states she picked up the RX from [facility], but her insurance is requesting refills be sent to [facility].

## 2017-05-30 ENCOUNTER — Telehealth: Payer: Self-pay | Admitting: Family Medicine

## 2017-05-30 NOTE — Telephone Encounter (Signed)
Pt. States she had talked with Simona Huh about changing the MG on her BP medication and has forgetting to remind him the last time pt was in, but like to see if this can be done. Pt use's [facility] on [address] Thanks CC

## 2017-06-01 NOTE — Telephone Encounter (Signed)
Please review. Thanks!  

## 2017-06-01 NOTE — Telephone Encounter (Signed)
Changed Metoprolol succinate from 50 mg to 100 mg qd on 05-26-17 at he [facility]. Check with pharmacy to be sure they received this prescription as listed in medication list.

## 2017-06-01 NOTE — Telephone Encounter (Signed)
Left message to call back  

## 2017-06-01 NOTE — Telephone Encounter (Signed)
Advised patient as below.  

## 2017-06-02 ENCOUNTER — Telehealth: Payer: Self-pay

## 2017-06-02 NOTE — Telephone Encounter (Signed)
LMTCB and schedule AWV with nha.  -MM 

## 2017-06-03 ENCOUNTER — Other Ambulatory Visit: Payer: Self-pay | Admitting: Family Medicine

## 2017-06-03 DIAGNOSIS — F3340 Major depressive disorder, recurrent, in remission, unspecified: Secondary | ICD-10-CM

## 2017-06-03 NOTE — Telephone Encounter (Signed)
[facility] faxed refill request for following medications: sertraline (ZOLOFT) 50 MG tablet     Please advise,Thanks [place]

## 2017-06-04 MED ORDER — SERTRALINE HCL 50 MG PO TABS: 50.0000 mg | ORAL_TABLET | Freq: Every day | ORAL | 3 refills | Status: DC

## 2017-06-16 ENCOUNTER — Emergency Department
Admission: EM | Admit: 2017-06-16 | Discharge: 2017-06-16 | Disposition: A | Discharge status: Home / Self Care | Payer: Medicare PPO | Attending: Emergency Medicine | Admitting: Emergency Medicine

## 2017-06-16 ENCOUNTER — Emergency Department: Payer: Medicare PPO

## 2017-06-16 ENCOUNTER — Other Ambulatory Visit: Payer: Self-pay

## 2017-06-16 DIAGNOSIS — Z79899 Other long term (current) drug therapy: Secondary | ICD-10-CM | POA: Insufficient documentation

## 2017-06-16 DIAGNOSIS — E039 Hypothyroidism, unspecified: Secondary | ICD-10-CM | POA: Insufficient documentation

## 2017-06-16 DIAGNOSIS — R1031 Right lower quadrant pain: Secondary | ICD-10-CM | POA: Diagnosis present

## 2017-06-16 DIAGNOSIS — N200 Calculus of kidney: Secondary | ICD-10-CM | POA: Insufficient documentation

## 2017-06-16 DIAGNOSIS — I10 Essential (primary) hypertension: Secondary | ICD-10-CM | POA: Diagnosis not present

## 2017-06-16 DIAGNOSIS — F17210 Nicotine dependence, cigarettes, uncomplicated: Secondary | ICD-10-CM | POA: Diagnosis not present

## 2017-06-16 LAB — CBC WITH DIFFERENTIAL/PLATELET
Basophils Absolute: 0 10*3/uL (ref 0–0.1)
Basophils Relative: 1 %
Eosinophils Absolute: 0.1 10*3/uL (ref 0–0.7)
Eosinophils Relative: 1 %
HCT: 34.3 % — ABNORMAL LOW (ref 35.0–47.0)
Hemoglobin: 11.2 g/dL — ABNORMAL LOW (ref 12.0–16.0)
Lymphocytes Relative: 14 %
Lymphs Abs: 0.9 10*3/uL — ABNORMAL LOW (ref 1.0–3.6)
MCH: 29.9 pg (ref 26.0–34.0)
MCHC: 32.5 g/dL (ref 32.0–36.0)
MCV: 91.8 fL (ref 80.0–100.0)
Monocytes Absolute: 0.4 10*3/uL (ref 0.2–0.9)
Monocytes Relative: 6 %
Neutro Abs: 5.1 10*3/uL (ref 1.4–6.5)
Neutrophils Relative %: 78 %
Platelets: 227 10*3/uL (ref 150–440)
RBC: 3.74 MIL/uL — ABNORMAL LOW (ref 3.80–5.20)
RDW: 14.3 % (ref 11.5–14.5)
WBC: 6.5 10*3/uL (ref 3.6–11.0)

## 2017-06-16 LAB — COMPREHENSIVE METABOLIC PANEL
ALT: 14 U/L (ref 14–54)
AST: 25 U/L (ref 15–41)
Albumin: 4.2 g/dL (ref 3.5–5.0)
Alkaline Phosphatase: 60 U/L (ref 38–126)
Anion gap: 9 (ref 5–15)
BUN: 19 mg/dL (ref 6–20)
CO2: 25 mmol/L (ref 22–32)
Calcium: 9.2 mg/dL (ref 8.9–10.3)
Chloride: 107 mmol/L (ref 101–111)
Creatinine, Ser: 0.96 mg/dL (ref 0.44–1.00)
GFR calc Af Amer: >60 mL/min (ref >60)
GFR calc non Af Amer: 58 mL/min — ABNORMAL LOW (ref >60)
Glucose, Bld: 121 mg/dL — ABNORMAL HIGH (ref 65–99)
Potassium: 3.8 mmol/L (ref 3.5–5.1)
Sodium: 141 mmol/L (ref 135–145)
Total Bilirubin: 0.5 mg/dL (ref 0.3–1.2)
Total Protein: 8.4 g/dL — ABNORMAL HIGH (ref 6.5–8.1)

## 2017-06-16 LAB — URINALYSIS, COMPLETE (UACMP) WITH MICROSCOPIC
Bacteria, UA: NONE SEEN
Bilirubin Urine: NEGATIVE
Glucose, UA: NEGATIVE mg/dL
Ketones, ur: NEGATIVE mg/dL
Leukocytes, UA: NEGATIVE
Nitrite: NEGATIVE
Protein, ur: 30 mg/dL — AB
Specific Gravity, Urine: 1.013 (ref 1.005–1.030)
pH: 6 (ref 5.0–8.0)

## 2017-06-16 MED ORDER — HYDROCODONE-ACETAMINOPHEN 5-325 MG PO TABS: 1.0000 | ORAL_TABLET | Freq: Four times a day (QID) | ORAL | 0 refills | Status: DC | PRN

## 2017-06-16 MED ORDER — TAMSULOSIN HCL 0.4 MG PO CAPS
0.4000 mg | ORAL_CAPSULE | Freq: Every day | ORAL | 0 refills | Status: DC
Start: 2017-06-16 — End: 2017-07-22

## 2017-06-16 MED ORDER — KETOROLAC TROMETHAMINE 30 MG/ML IJ SOLN
30.0000 mg | Freq: Once | INTRAMUSCULAR | Status: AC
  Administered 2017-06-16: 30 mg via INTRAVENOUS

## 2017-06-16 MED ORDER — KETOROLAC TROMETHAMINE 30 MG/ML IJ SOLN
30.0000 mg | Freq: Once | INTRAMUSCULAR | Status: DC
  Filled 2017-06-16: qty 1

## 2017-06-16 MED ORDER — HYDROCODONE-ACETAMINOPHEN 5-325 MG PO TABS
1.0000 | ORAL_TABLET | Freq: Once | ORAL | Status: AC
  Administered 2017-06-16: 1 via ORAL
  Filled 2017-06-16: qty 1

## 2017-06-16 MED ORDER — ONDANSETRON 4 MG PO TBDP
4.0000 mg | ORAL_TABLET | Freq: Once | ORAL | Status: AC
  Administered 2017-06-16: 4 mg via ORAL
  Filled 2017-06-16: qty 1

## 2017-06-16 NOTE — ED Notes (Signed)
Pt's brother arrived to bedside to take patient home.

## 2017-06-16 NOTE — ED Notes (Signed)
Pt given phone to call her brother for D/C. NAD noted at this time. Will continue to monitor for further patient needs.

## 2017-06-16 NOTE — ED Triage Notes (Signed)
Pt arrives via ems from home. EMS reports pt has been having abdominal pain wrapping around to right flank since 8pm last night. Pt states she has a hx of kidney stones, and has been having some burning with urination, as well as blood in the urine starting yesterday.

## 2017-06-16 NOTE — ED Provider Notes (Signed)
[facility] Emergency Department Provider Note  ____________________________________________   First MD Initiated Contact with Patient 06/16/17 0631     (approximate)  I have reviewed the triage vital signs and the nursing notes.   HISTORY  Chief Complaint Flank Pain   HPI Melissa Fitzgerald is a 72 y.o. female comes to the emergency department via EMS with sudden onset severe right flank pain wrapping around her flank towards her groin that began last evening.  Pain is now severe.  Associated with nausea.  She is having some dysuria.  She has a long-standing history of renal colic.  Nothing seems to make her symptoms better or worse.  She denies fevers or chills. Past Medical History:  Diagnosis Date  . Anemia   . Colon polyp   . Depression   . Hyperlipidemia   . Hypertension   . Irritable bowel disease   . Kidney stone   . Thyroid disease     Patient Active Problem List   Diagnosis Date Noted  . History of colon cancer 03/22/2015  . 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 Anemia, iron deficiency 10/23/2014  . HLD (hyperlipidemia) 10/23/2014  . Essential (primary) hypertension 10/23/2014  . Acid reflux 10/23/2014  . Clinical depression 10/23/2014  . Peptic ulcer 10/23/2014  . Hypothyroidism, postop 10/23/2014  . Anemia 10/18/2014  . Arthritis 10/18/2014  . Chronic constipation 10/18/2014  . Sleep disturbance 10/18/2014  . Elevated liver enzymes 10/18/2014  . Chronic headache 10/18/2014  . History of renal calculi 10/18/2014  . Malignant neoplasm of cecum ([place]) 10/17/2013  . Colon cancer ([place]) 09/21/2013    Past Surgical History:  Procedure Laterality Date  . ABDOMINAL HYSTERECTOMY    . BREAST ENHANCEMENT SURGERY    . CHOLECYSTECTOMY  2000  . COLECTOMY  09/2013  . COLONOSCOPY  09-13-13   Dr [name]  . COLONOSCOPY WITH PROPOFOL N/A 10/18/2014   Procedure: COLONOSCOPY WITH PROPOFOL;   Surgeon: [name], MD;  Location: [facility] ENDOSCOPY;  Service: Endoscopy;  Laterality: N/A;  . ESOPHAGOGASTRODUODENOSCOPY (EGD) WITH PROPOFOL N/A 01/03/2015   Procedure: ESOPHAGOGASTRODUODENOSCOPY (EGD) WITH PROPOFOL;  Surgeon: [name], MD;  Location: [facility] ENDOSCOPY;  Service: Endoscopy;  Laterality: N/A;  . FOOT SURGERY    . KIDNEY STONE SURGERY    . NASAL SINUS SURGERY    . PORTACATH PLACEMENT  10/24/13  . THYROID SURGERY    . TONSILLECTOMY      Prior to Admission medications   Medication Sig Start Date End Date Taking? Authorizing Provider  aspirin-acetaminophen-caffeine (EXCEDRIN MIGRAINE) [phone] MG tablet Take 2 tablets by mouth 2 (two) times daily as needed for headache or migraine.    [provider]  carboxymethylcellulose (REFRESH PLUS) 0.5 % SOLN Place 1 drop into both eyes daily as needed (dry eyes).     [provider]  diphenoxylate-atropine (LOMOTIL) 2.5-0.025 MG tablet TAKE ONE TABLET BY MOUTH THREE TIMES DAILY AS NEEDED FOR DIARRHEA OR LOOSE STOOLS 05/09/16   Chrismon, Vickki Muff, PA  Ferrous Fumarate 324 (106 FE) MG TABS Take 1 tablet by mouth daily. Patient not taking: Reported on 06/18/2017 10/18/14   Forest Gleason, MD  Ferrous Sulfate 28 MG TABS Take 28 mg by mouth daily.    [provider]  Garlic 5409 MG CAPS Take 1,000 mg by mouth daily.    [provider]  HYDROcodone-acetaminophen (NORCO) 5-325 MG tablet Take 1 tablet by mouth every 6 (six)  hours as needed for up to 15 doses for severe pain. 06/16/17   Darel Hong, MD  levothyroxine (SYNTHROID, LEVOTHROID) 100 MCG tablet TAKE ONE TABLET BY MOUTH ONCE DAILY BEFORE BREAKFAST 02/11/17   Chrismon, Vickki Muff, PA  meclizine (ANTIVERT) 25 MG tablet Take 1 tablet (25 mg total) by mouth 4 (four) times daily as needed for dizziness. Nausea or anxiety. 05/11/15   Chrismon, Vickki Muff, PA  Melatonin 5 MG TABS Take 5 mg by mouth at bedtime as needed (sleep).    [provider]  Menthol, Topical Analgesic, (BIOFREEZE EX) Apply 1 application topically daily as needed (pain).    [provider]  metoprolol succinate (TOPROL-XL) 100 MG 24 hr tablet Take 1 tablet (100 mg total) by mouth daily. Take with or immediately following a meal. 05/26/17   Chrismon, Vickki Muff, PA  Multiple Vitamin (MULTIVITAMIN WITH MINERALS) TABS tablet Take 1 tablet by mouth daily.    [provider]  ondansetron (ZOFRAN) 4 MG tablet TAKE ONE TABLET BY MOUTH EVERY 6 HOURS AS NEEDED FOR CHEMOTHERAPY INDUCED NAUSEA AND VOMINTING 10/29/16   Holley Bouche, NP  Potassium 99 MG TABS Take 99 mg by mouth daily.    [provider]  potassium chloride SA (K-DUR,KLOR-CON) 20 MEQ tablet Take 1 tablet (20 mEq total) by mouth daily. Patient not taking: Reported on 06/18/2017 12/10/16   Chrismon, Vickki Muff, PA  sertraline (ZOLOFT) 50 MG tablet Take 1 tablet (50 mg total) by mouth daily. Patient not taking: Reported on 06/18/2017 06/04/17   Chrismon, Vickki Muff, PA  tamsulosin (FLOMAX) 0.4 MG CAPS capsule Take 1 capsule (0.4 mg total) by mouth daily. 06/16/17   Nena Polio, MD  zolpidem (AMBIEN) 5 MG tablet Take 1 tablet (5 mg total) by mouth at bedtime as needed. for sleep Patient not taking: Reported on 06/18/2017 02/17/17   Chrismon, Vickki Muff, PA    Allergies Codeine; Oxycodone; Penicillins; Tramadol; and Trintellix [vortioxetine]  Family History  Problem Relation Age of Onset  . Heart disease Mother   . Diabetes Mother   . Arthritis Sister   . Hyperlipidemia Sister   . COPD Brother   . Diabetes Sister   . Colon cancer Unknown        first cousin  . Breast cancer Maternal Aunt   . Breast cancer Paternal Aunt     Social History Social History   Tobacco Use  . Smoking status: Current Some Day Smoker    Packs/day: 0.00    Types: Cigarettes  . Smokeless tobacco: Never Used  . Tobacco comment: still smoking every now and then, rarely one  Substance Use Topics   . Alcohol use: Yes    Alcohol/week: 0.0 oz    Comment: occasionally  . Drug use: No    Review of Systems Constitutional: No fever/chills Eyes: No visual changes. ENT: No sore throat. Cardiovascular: Denies chest pain. Respiratory: Denies shortness of breath. Gastrointestinal: Positive for abdominal pain.  Positive for nausea, no vomiting.  No diarrhea.  No constipation. Genitourinary: Positive for dysuria. Musculoskeletal: Negative for back pain. Skin: Negative for rash. Neurological: Negative for headaches, focal weakness or numbness.   ____________________________________________   PHYSICAL EXAM:  VITAL SIGNS: ED Triage Vitals  Enc Vitals Group     BP 06/16/17 0609 (!) 190/122     Pulse Rate 06/16/17 0609 63     Resp --      Temp 06/16/17 0609 97.8 F (36.6 C)     Temp  Source 06/16/17 0609 Oral     SpO2 06/16/17 0604 98 %     Weight 06/16/17 0610 172 lb (78 kg)     Height 06/16/17 0610 5\' 6"  (1.676 m)     Head Circumference --      Peak Flow --      Pain Score 06/16/17 0609 10     Pain Loc --      Pain Edu? --      Excl. in [place]? --     Constitutional: Alert and oriented x4 quite uncomfortable appearing nontoxic no diaphoresis speaks in full clear sentences Eyes: PERRL EOMI. Head: Atraumatic. Nose: No congestion/rhinnorhea. Mouth/Throat: No trismus Neck: No stridor.   Cardiovascular: Normal rate, regular rhythm. Grossly normal heart sounds.  Good peripheral circulation. Respiratory: Normal respiratory effort.  No retractions. Lungs CTAB and moving good air Gastrointestinal: Soft nontender no rebound no guarding no peritonitis Musculoskeletal: No lower extremity edema   Neurologic:  Normal speech and language. No gross focal neurologic deficits are appreciated. Skin:  Skin is warm, dry and intact. No rash noted. Psychiatric: Mood and affect are normal. Speech and behavior are normal.    ____________________________________________   DIFFERENTIAL includes  but not limited to  Renal colic, pyelonephritis, AAA, infected stone ____________________________________________   LABS (all labs ordered are listed, but only abnormal results are displayed)  Labs Reviewed  CBC WITH DIFFERENTIAL/PLATELET - Abnormal; Notable for the following components:      Result Value   RBC 3.74 (*)    Hemoglobin 11.2 (*)    HCT 34.3 (*)    Lymphs Abs 0.9 (*)    All other components within normal limits  COMPREHENSIVE METABOLIC PANEL - Abnormal; Notable for the following components:   Glucose, Bld 121 (*)    Total Protein 8.4 (*)    GFR calc non Af Amer 58 (*)    All other components within normal limits  URINALYSIS, COMPLETE (UACMP) WITH MICROSCOPIC - Abnormal; Notable for the following components:   Color, Urine YELLOW (*)    APPearance CLOUDY (*)    Hgb urine dipstick LARGE (*)    Protein, ur 30 (*)    Squamous Epithelial / LPF 0-5 (*)    All other components within normal limits    Lab work reviewed by me with no signs of infection _______________________________________  EKG   ____________________________________________  RADIOLOGY  CT stone shows large obstructing unilateral 1 cm stone ____________________________________________   PROCEDURES  Procedure(s) performed: no  Procedures  Critical Care performed: no  Observation: no ____________________________________________   INITIAL IMPRESSION / ASSESSMENT AND PLAN / ED COURSE  Pertinent labs & imaging results that were available during my care of the patient were reviewed by me and considered in my medical decision making (see chart for details).  The patient arrives uncomfortable appearing with right flank pain radiating to her groin along with hematuria consistent with kidney stone.  Given her advanced age CT scan will be obtained.  Normal renal function currently.     ----------------------------------------- 7:50 AM on  06/16/2017 -----------------------------------------  I spoke with on-call urologist Dr. Bernardo Heater who recommends close outpatient follow-up.  The patient is not infected.  Discussed strict return precautions particularly involving fever.  The patient verbalizes understanding and agreement with plan. ____________________________________________   FINAL CLINICAL IMPRESSION(S) / ED DIAGNOSES  Final diagnoses:  Kidney stone      NEW MEDICATIONS STARTED DURING THIS VISIT:  Discharge Medication List as of 06/16/2017  8:18 AM  START taking these medications   Details  HYDROcodone-acetaminophen (NORCO) 5-325 MG tablet Take 1 tablet by mouth every 6 (six) hours as needed for up to 15 doses for severe pain., Starting Tue 06/16/2017, Print         Note:  This document was prepared using Dragon voice recognition software and may include unintentional dictation errors.     Darel Hong, MD 06/19/17 [phone]

## 2017-06-16 NOTE — ED Notes (Signed)
MD aware of patient's BP, states okay for D/C. Pt instructed to take home meds when she gets home.

## 2017-06-16 NOTE — Discharge Instructions (Addendum)
Please make an appointment to follow-up with the urologist this coming week for reevaluation and return to the emergency department for any concerns such as fevers, chills, worsening pain, or for any other issues whatsoever.  It was a pleasure to take care of you today, and thank you for coming to our emergency department.  If you have any questions or concerns before leaving please ask the nurse to grab me and I'm more than happy to go through your aftercare instructions again.  If you were prescribed any opioid pain medication today such as Norco, Vicodin, Percocet, morphine, hydrocodone, or oxycodone please make sure you do not drive when you are taking this medication as it can alter your ability to drive safely.  If you have any concerns once you are home that you are not improving or are in fact getting worse before you can make it to your follow-up appointment, please do not hesitate to call 911 and come back for further evaluation.  Darel Hong, MD  Results for orders placed or performed during the hospital encounter of 06/16/17  CBC with Differential  Result Value Ref Range   WBC 6.5 3.6 - 11.0 K/uL   RBC 3.74 (L) 3.80 - 5.20 MIL/uL   Hemoglobin 11.2 (L) 12.0 - 16.0 g/dL   HCT 34.3 (L) 35.0 - 47.0 %   MCV 91.8 80.0 - 100.0 fL   MCH 29.9 26.0 - 34.0 pg   MCHC 32.5 32.0 - 36.0 g/dL   RDW 14.3 11.5 - 14.5 %   Platelets 227 150 - 440 K/uL   Neutrophils Relative % 78 %   Neutro Abs 5.1 1.4 - 6.5 K/uL   Lymphocytes Relative 14 %   Lymphs Abs 0.9 (L) 1.0 - 3.6 K/uL   Monocytes Relative 6 %   Monocytes Absolute 0.4 0.2 - 0.9 K/uL   Eosinophils Relative 1 %   Eosinophils Absolute 0.1 0 - 0.7 K/uL   Basophils Relative 1 %   Basophils Absolute 0.0 0 - 0.1 K/uL  Comprehensive metabolic panel  Result Value Ref Range   Sodium 141 135 - 145 mmol/L   Potassium 3.8 3.5 - 5.1 mmol/L   Chloride 107 101 - 111 mmol/L   CO2 25 22 - 32 mmol/L   Glucose, Bld 121 (H) 65 - 99 mg/dL   BUN 19  6 - 20 mg/dL   Creatinine, Ser 0.96 0.44 - 1.00 mg/dL   Calcium 9.2 8.9 - 10.3 mg/dL   Total Protein 8.4 (H) 6.5 - 8.1 g/dL   Albumin 4.2 3.5 - 5.0 g/dL   AST 25 15 - 41 U/L   ALT 14 14 - 54 U/L   Alkaline Phosphatase 60 38 - 126 U/L   Total Bilirubin 0.5 0.3 - 1.2 mg/dL   GFR calc non Af Amer 58 (L) >60 mL/min   GFR calc Af Amer >60 >60 mL/min   Anion gap 9 5 - 15   Ct Renal Stone Study  Result Date: 06/16/2017 CLINICAL DATA:  Abdominal pain extending to the right flank beginning last night. History of renal stone disease. EXAM: CT ABDOMEN AND PELVIS WITHOUT CONTRAST TECHNIQUE: Multidetector CT imaging of the abdomen and pelvis was performed following the standard protocol without IV contrast. COMPARISON:  02/19/2016 FINDINGS: Lower chest: Clear except for mild scarring at the left base. Hepatobiliary: Previous cholecystectomy. Chronic small scar in the caudal tip of the right lobe. No active better biliary finding by CT. Pancreas: Normal Spleen: Normal Adrenals/Urinary Tract: Adrenal glands  are normal. Left kidney shows chronic cysts, the largest in the lower pole measuring 7.2 cm in diameter. Focal wall calcifications of a midpole cyst are unchanged. Nonobstructing renal calculi noted within the left kidney. No passing stone on the left. The right kidney shows swelling with hydroureteronephrosis. There are multiple nonobstructing stones in the right kidney, most under 5 mm in size. The right ureter is dilated to the pelvic brim level where there is a 10 mm stone causing obstruction. No stone in the bladder. Stomach/Bowel: No bowel finding of significance. Previous anastomotic sutures in the right lower quadrant. Vascular/Lymphatic: Aortic atherosclerosis and tortuosity. No aneurysm. IVC is normal. No retroperitoneal adenopathy. Reproductive: Previous hysterectomy.  No pelvic mass. Other: No free fluid or air. Musculoskeletal: Curvature in degenerative changes of the lumbar spine. IMPRESSION:  Hydroureteronephrosis on the right because of ureteral obstruction at the pelvic brim level by a 1 cm stone. Multiple nonobstructing renal calculi bilaterally. Large renal cysts on the left. Aortic atherosclerosis. Previous cholecystectomy, hysterectomy and bowel anastomosis in the right lower quadrant. No acute bowel finding. Electronically Signed   By: Nelson Chimes M.D.   On: 06/16/2017 07:24

## 2017-06-18 ENCOUNTER — Telehealth: Payer: Self-pay | Admitting: Radiology

## 2017-06-18 ENCOUNTER — Ambulatory Visit (INDEPENDENT_AMBULATORY_CARE_PROVIDER_SITE_OTHER): Payer: Medicare PPO | Admitting: Urology

## 2017-06-18 ENCOUNTER — Encounter: Payer: Self-pay | Admitting: Urology

## 2017-06-18 ENCOUNTER — Other Ambulatory Visit: Payer: Self-pay | Admitting: Radiology

## 2017-06-18 VITALS — BP 143/82 | HR 82 | Ht 66.0 in | Wt 172.0 lb

## 2017-06-18 DIAGNOSIS — N201 Calculus of ureter: Secondary | ICD-10-CM

## 2017-06-18 DIAGNOSIS — N200 Calculus of kidney: Secondary | ICD-10-CM

## 2017-06-18 LAB — URINALYSIS, COMPLETE
Bilirubin, UA: NEGATIVE
Glucose, UA: NEGATIVE
Ketones, UA: NEGATIVE
Nitrite, UA: NEGATIVE
Specific Gravity, UA: 1.02 (ref 1.005–1.030)
Urobilinogen, Ur: 0.2 mg/dL (ref 0.2–1.0)
pH, UA: 5.5 (ref 5.0–7.5)

## 2017-06-18 LAB — MICROSCOPIC EXAMINATION: Epithelial Cells (non renal): >10 /hpf — AB (ref 0–10)

## 2017-06-18 NOTE — Telephone Encounter (Signed)
Pt c/o not feeling well with vomiting & feeling cold. Pt states she does not have a fever but has not checked with a thermometer. Pt states she will not be able to go to her appt with Dr Bernardo Heater this morning. Pt was encouraged to go to the appt or to the emergency room d/t symptoms. Pt states she "will try to find a ride to the appt".

## 2017-06-19 ENCOUNTER — Ambulatory Visit: Payer: Medicare PPO

## 2017-06-19 DIAGNOSIS — N200 Calculus of kidney: Secondary | ICD-10-CM | POA: Insufficient documentation

## 2017-06-19 DIAGNOSIS — N201 Calculus of ureter: Secondary | ICD-10-CM | POA: Insufficient documentation

## 2017-06-19 NOTE — H&P (View-Only) (Signed)
06/18/2017 9:32 PM   Melissa Fitzgerald 1945-06-23 [id number]  Referring provider: [name], [address] [place], [place] [address]  Chief Complaint  Patient presents with  . Nephrolithiasis    New Patient    HPI: Melissa Fitzgerald is a 72 year old female who presented to the emergency department on 06/16/2017 with a several hour history of acute onset of severe right flank pain radiating to the right groin region.  Her pain was rated 10/10 without identifiable precipitating, aggravating or alleviating factors.  She had nausea without vomiting.  She denied fever or chills.  She has a prior history of stone disease and states her pain was similar to prior episodes of renal colic.  She gives a remote history of open cystolithotomy.  She states she is typically been able to pass her stones.  A CT of the abdomen pelvis was performed which showed a 10 mm right mid ureteral calculus with moderate hydronephrosis/hydroureter.  She also had bilateral, nonobstructing renal calculi.  She was discharged on tamsulosin and hydrocodone.  Her pain has significantly improved and is presently only mild.   PMH: Past Medical History:  Diagnosis Date  . Anemia   . Colon polyp   . Depression   . Hyperlipidemia   . Hypertension   . Hypothyroidism   . Irritable bowel disease   . Kidney stone   . Thyroid disease     Surgical History: Past Surgical History:  Procedure Laterality Date  . ABDOMINAL HYSTERECTOMY    . BREAST ENHANCEMENT SURGERY    . CHOLECYSTECTOMY  2000  . COLECTOMY  09/2013  . COLONOSCOPY  09-13-13   Dr [name]  . COLONOSCOPY WITH PROPOFOL N/A 10/18/2014   Procedure: COLONOSCOPY WITH PROPOFOL;  Surgeon: [name], MD;  Location: [facility] ENDOSCOPY;  Service: Endoscopy;  Laterality: N/A;  . ESOPHAGOGASTRODUODENOSCOPY (EGD) WITH PROPOFOL N/A 01/03/2015   Procedure: ESOPHAGOGASTRODUODENOSCOPY (EGD) WITH PROPOFOL;  Surgeon: [name], MD;  Location: [facility]  ENDOSCOPY;  Service: Endoscopy;  Laterality: N/A;  . EYE SURGERY     bilateral cataract   . FOOT SURGERY    . KIDNEY STONE SURGERY    . NASAL SINUS SURGERY    . PORT-A-CATH REMOVAL    . PORTACATH PLACEMENT  10/24/13  . THYROID SURGERY    . TONSILLECTOMY      Home Medications:  Allergies as of 06/18/2017      Reactions   Codeine Nausea And Vomiting   Oxycodone Nausea And Vomiting   Penicillins Nausea And Vomiting   Has patient had a PCN reaction causing immediate rash, facial/tongue/throat swelling, SOB or lightheadedness with hypotension: No Has patient had a PCN reaction causing severe rash involving mucus membranes or skin necrosis: No Has patient had a PCN reaction that required hospitalization: No Has patient had a PCN reaction occurring within the last 10 years: No If all of the above answers are "NO", then may proceed with Cephalosporin use.   Tramadol Nausea And Vomiting   Trintellix [vortioxetine] Itching      Medication List        Accurate as of 06/18/17 11:59 PM. Always use your most recent med list.          aspirin-acetaminophen-caffeine 250-250-65 MG tablet Commonly known as:  EXCEDRIN MIGRAINE Take 2 tablets by mouth 2 (two) times daily as needed for headache or migraine.   BIOFREEZE EX Apply 1 application topically daily as needed (pain).   carboxymethylcellulose 0.5 % Soln Commonly known as:  REFRESH PLUS Place 1 drop into both eyes daily as needed (dry eyes).   diphenoxylate-atropine 2.5-0.025 MG tablet Commonly known as:  LOMOTIL TAKE ONE TABLET BY MOUTH THREE TIMES DAILY AS NEEDED FOR DIARRHEA OR LOOSE STOOLS   Ferrous Fumarate 324 (106 Fe) MG Tabs tablet Commonly known as:  HEMOCYTE - 106 mg FE Take 1 tablet by mouth daily.   Ferrous Sulfate 28 MG Tabs Take 28 mg by mouth daily.   Garlic 9924 MG Caps Take 1,000 mg by mouth daily.   HYDROcodone-acetaminophen 5-325 MG tablet Commonly known as:  NORCO Take 1 tablet by mouth every 6 (six)  hours as needed for up to 15 doses for severe pain.   levothyroxine 100 MCG tablet Commonly known as:  SYNTHROID, LEVOTHROID TAKE ONE TABLET BY MOUTH ONCE DAILY BEFORE BREAKFAST   meclizine 25 MG tablet Commonly known as:  ANTIVERT Take 1 tablet (25 mg total) by mouth 4 (four) times daily as needed for dizziness. Nausea or anxiety.   Melatonin 5 MG Tabs Take 5 mg by mouth at bedtime as needed (sleep).   metoprolol succinate 100 MG 24 hr tablet Commonly known as:  TOPROL-XL Take 1 tablet (100 mg total) by mouth daily. Take with or immediately following a meal.   multivitamin with minerals Tabs tablet Take 1 tablet by mouth daily.   ondansetron 4 MG tablet Commonly known as:  ZOFRAN TAKE ONE TABLET BY MOUTH EVERY 6 HOURS AS NEEDED FOR CHEMOTHERAPY INDUCED NAUSEA AND VOMINTING   Potassium 99 MG Tabs Take 99 mg by mouth daily.   tamsulosin 0.4 MG Caps capsule Commonly known as:  FLOMAX Take 1 capsule (0.4 mg total) by mouth daily.       Allergies:  Allergies  Allergen Reactions  . Codeine Nausea And Vomiting  . Oxycodone Nausea And Vomiting  . Penicillins Nausea And Vomiting    Has patient had a PCN reaction causing immediate rash, facial/tongue/throat swelling, SOB or lightheadedness with hypotension: No Has patient had a PCN reaction causing severe rash involving mucus membranes or skin necrosis: No Has patient had a PCN reaction that required hospitalization: No Has patient had a PCN reaction occurring within the last 10 years: No If all of the above answers are "NO", then may proceed with Cephalosporin use.   . Tramadol Nausea And Vomiting  . Trintellix [Vortioxetine] Itching    Family History: Family History  Problem Relation Age of Onset  . Heart disease Mother   . Diabetes Mother   . Arthritis Sister   . Hyperlipidemia Sister   . COPD Brother   . Diabetes Sister   . Colon cancer Unknown        first cousin  . Breast cancer Maternal Aunt   . Breast  cancer Paternal Aunt     Social History:  reports that she has been smoking cigarettes.  She has been smoking about 0.00 packs per day. She has never used smokeless tobacco. She reports that she drinks alcohol. She reports that she does not use drugs.  ROS: UROLOGY Frequent Urination?: Yes Hard to postpone urination?: No Burning/pain with urination?: No Get up at night to urinate?: Yes Leakage of urine?: No Urine stream starts and stops?: Yes Trouble starting stream?: No Do you have to strain to urinate?: No Blood in urine?: Yes Urinary tract infection?: Yes Sexually transmitted disease?: No Injury to kidneys or bladder?: No Painful intercourse?: No Weak stream?: No Currently pregnant?: No Vaginal bleeding?: No  Gastrointestinal Nausea?: No Vomiting?:  No Indigestion/heartburn?: No Diarrhea?: No Constipation?: No  Constitutional Fever: No Night sweats?: No Weight loss?: No Fatigue?: No  Skin Skin rash/lesions?: No Itching?: No  Eyes Blurred vision?: No Double vision?: No  Ears/Nose/Throat Sore throat?: No Sinus problems?: No  Hematologic/Lymphatic Swollen glands?: No Easy bruising?: No  Cardiovascular Leg swelling?: No Chest pain?: No  Respiratory Cough?: No Shortness of breath?: No  Endocrine Excessive thirst?: No  Musculoskeletal Back pain?: No Joint pain?: No  Neurological Headaches?: No Dizziness?: No  Psychologic Depression?: No Anxiety?: No  Physical Exam: BP (!) 143/82   Pulse 82   Ht 5\' 6"  (1.676 m)   Wt 172 lb (78 kg)   BMI 27.76 kg/m   Constitutional:  Alert and oriented, No acute distress. HEENT: [place] AT, moist mucus membranes.  Trachea midline, no masses. Cardiovascular: No clubbing, cyanosis, or edema.  RRR Respiratory: Normal respiratory effort, no increased work of breathing.  Lungs clear GI: Abdomen is soft, nontender, nondistended, no abdominal masses GU: No CVA tenderness Lymph: No cervical or inguinal  lymphadenopathy. Skin: No rashes, bruises or suspicious lesions. Neurologic: Grossly intact, no focal deficits, moving all 4 extremities. Psychiatric: Normal mood and affect.  Laboratory Data: Lab Results  Component Value Date   WBC 6.5 06/16/2017   HGB 11.2 (L) 06/16/2017   HCT 34.3 (L) 06/16/2017   MCV 91.8 06/16/2017   PLT 227 06/16/2017    Lab Results  Component Value Date   CREATININE 0.96 06/16/2017    Urinalysis    Component Value Date/Time   COLORURINE YELLOW (A) 06/16/2017 0747   APPEARANCEUR Clear 06/18/2017 1140   LABSPEC 1.013 06/16/2017 0747   LABSPEC 1.012 02/07/2014 1350   PHURINE 6.0 06/16/2017 0747   GLUCOSEU Negative 06/18/2017 1140   GLUCOSEU Negative 02/07/2014 1350   HGBUR LARGE (A) 06/16/2017 0747   BILIRUBINUR Negative 06/18/2017 1140   BILIRUBINUR Negative 02/07/2014 1350   KETONESUR NEGATIVE 06/16/2017 0747   PROTEINUR Trace 06/18/2017 1140   PROTEINUR 30 (A) 06/16/2017 0747   UROBILINOGEN 0.2 05/26/2017 1450   NITRITE Negative 06/18/2017 1140   NITRITE NEGATIVE 06/16/2017 0747   LEUKOCYTESUR Trace 06/18/2017 1140   LEUKOCYTESUR Negative 02/07/2014 1350    Pertinent Imaging: CT images were personally reviewed  Assessment & Plan:   72 year old female with nephrolithiasis and obstructing 10 mm right mid ureteral calculus.  She was informed that her ureteral calculus will be too large to pass.  Management options were discussed including shockwave lithotripsy and ureteroscopy.  Based on stone size, location feel ureteroscopy would be the best option.  She has elected to proceed and is scheduled for next week.  The indications and nature of the planned procedure were discussed as well as the potential  benefits and expected outcome.  Alternatives have been discussed in detail. The most common complications and side effects were discussed including but not limited to infection/sepsis; blood loss; damage to urethra, bladder, ureter, kidney; need for  multiple surgeries; need for prolonged stent placement as well as general anesthesia risks. Although uncommon she was also informed of the possibility that the renal calculus may not be able to be treated due to inability to obtain access to the upper ureter. In that event she would require stent placement and a follow-up procedure after a period of stent dilation. All of her questions were answered and she desires to proceed.  At the time of our visit he she did not appear to be distracted or in pain.    Aariya Ferrick C  Amere Bricco, MD  [facility] [address], [address] [place], [place] [address] [phone]  

## 2017-06-19 NOTE — Progress Notes (Signed)
06/18/2017 9:32 PM   Melissa Fitzgerald 1945/06/03 [id number]  Referring provider: [name], [address] [place], [place] [address]  Chief Complaint  Patient presents with  . Nephrolithiasis    New Patient    HPI: Melissa Fitzgerald is a 72 year old female who presented to the emergency department on 06/16/2017 with a several hour history of acute onset of severe right flank pain radiating to the right groin region.  Her pain was rated 10/10 without identifiable precipitating, aggravating or alleviating factors.  She had nausea without vomiting.  She denied fever or chills.  She has a prior history of stone disease and states her pain was similar to prior episodes of renal colic.  She gives a remote history of open cystolithotomy.  She states she is typically been able to pass her stones.  A CT of the abdomen pelvis was performed which showed a 10 mm right mid ureteral calculus with moderate hydronephrosis/hydroureter.  She also had bilateral, nonobstructing renal calculi.  She was discharged on tamsulosin and hydrocodone.  Her pain has significantly improved and is presently only mild.   PMH: Past Medical History:  Diagnosis Date  . Anemia   . Colon polyp   . Depression   . Hyperlipidemia   . Hypertension   . Hypothyroidism   . Irritable bowel disease   . Kidney stone   . Thyroid disease     Surgical History: Past Surgical History:  Procedure Laterality Date  . ABDOMINAL HYSTERECTOMY    . BREAST ENHANCEMENT SURGERY    . CHOLECYSTECTOMY  2000  . COLECTOMY  09/2013  . COLONOSCOPY  09-13-13   Dr [name]  . COLONOSCOPY WITH PROPOFOL N/A 10/18/2014   Procedure: COLONOSCOPY WITH PROPOFOL;  Surgeon: [name], MD;  Location: [facility] ENDOSCOPY;  Service: Endoscopy;  Laterality: N/A;  . ESOPHAGOGASTRODUODENOSCOPY (EGD) WITH PROPOFOL N/A 01/03/2015   Procedure: ESOPHAGOGASTRODUODENOSCOPY (EGD) WITH PROPOFOL;  Surgeon: [name], MD;  Location: [facility]  ENDOSCOPY;  Service: Endoscopy;  Laterality: N/A;  . EYE SURGERY     bilateral cataract   . FOOT SURGERY    . KIDNEY STONE SURGERY    . NASAL SINUS SURGERY    . PORT-A-CATH REMOVAL    . PORTACATH PLACEMENT  10/24/13  . THYROID SURGERY    . TONSILLECTOMY      Home Medications:  Allergies as of 06/18/2017      Reactions   Codeine Nausea And Vomiting   Oxycodone Nausea And Vomiting   Penicillins Nausea And Vomiting   Has patient had a PCN reaction causing immediate rash, facial/tongue/throat swelling, SOB or lightheadedness with hypotension: No Has patient had a PCN reaction causing severe rash involving mucus membranes or skin necrosis: No Has patient had a PCN reaction that required hospitalization: No Has patient had a PCN reaction occurring within the last 10 years: No If all of the above answers are "NO", then may proceed with Cephalosporin use.   Tramadol Nausea And Vomiting   Trintellix [vortioxetine] Itching      Medication List        Accurate as of 06/18/17 11:59 PM. Always use your most recent med list.          aspirin-acetaminophen-caffeine 250-250-65 MG tablet Commonly known as:  EXCEDRIN MIGRAINE Take 2 tablets by mouth 2 (two) times daily as needed for headache or migraine.   BIOFREEZE EX Apply 1 application topically daily as needed (pain).   carboxymethylcellulose 0.5 % Soln Commonly known as:  REFRESH PLUS Place 1 drop into both eyes daily as needed (dry eyes).   diphenoxylate-atropine 2.5-0.025 MG tablet Commonly known as:  LOMOTIL TAKE ONE TABLET BY MOUTH THREE TIMES DAILY AS NEEDED FOR DIARRHEA OR LOOSE STOOLS   Ferrous Fumarate 324 (106 Fe) MG Tabs tablet Commonly known as:  HEMOCYTE - 106 mg FE Take 1 tablet by mouth daily.   Ferrous Sulfate 28 MG Tabs Take 28 mg by mouth daily.   Garlic 4315 MG Caps Take 1,000 mg by mouth daily.   HYDROcodone-acetaminophen 5-325 MG tablet Commonly known as:  NORCO Take 1 tablet by mouth every 6 (six)  hours as needed for up to 15 doses for severe pain.   levothyroxine 100 MCG tablet Commonly known as:  SYNTHROID, LEVOTHROID TAKE ONE TABLET BY MOUTH ONCE DAILY BEFORE BREAKFAST   meclizine 25 MG tablet Commonly known as:  ANTIVERT Take 1 tablet (25 mg total) by mouth 4 (four) times daily as needed for dizziness. Nausea or anxiety.   Melatonin 5 MG Tabs Take 5 mg by mouth at bedtime as needed (sleep).   metoprolol succinate 100 MG 24 hr tablet Commonly known as:  TOPROL-XL Take 1 tablet (100 mg total) by mouth daily. Take with or immediately following a meal.   multivitamin with minerals Tabs tablet Take 1 tablet by mouth daily.   ondansetron 4 MG tablet Commonly known as:  ZOFRAN TAKE ONE TABLET BY MOUTH EVERY 6 HOURS AS NEEDED FOR CHEMOTHERAPY INDUCED NAUSEA AND VOMINTING   Potassium 99 MG Tabs Take 99 mg by mouth daily.   tamsulosin 0.4 MG Caps capsule Commonly known as:  FLOMAX Take 1 capsule (0.4 mg total) by mouth daily.       Allergies:  Allergies  Allergen Reactions  . Codeine Nausea And Vomiting  . Oxycodone Nausea And Vomiting  . Penicillins Nausea And Vomiting    Has patient had a PCN reaction causing immediate rash, facial/tongue/throat swelling, SOB or lightheadedness with hypotension: No Has patient had a PCN reaction causing severe rash involving mucus membranes or skin necrosis: No Has patient had a PCN reaction that required hospitalization: No Has patient had a PCN reaction occurring within the last 10 years: No If all of the above answers are "NO", then may proceed with Cephalosporin use.   . Tramadol Nausea And Vomiting  . Trintellix [Vortioxetine] Itching    Family History: Family History  Problem Relation Age of Onset  . Heart disease Mother   . Diabetes Mother   . Arthritis Sister   . Hyperlipidemia Sister   . COPD Brother   . Diabetes Sister   . Colon cancer Unknown        first cousin  . Breast cancer Maternal Aunt   . Breast  cancer Paternal Aunt     Social History:  reports that she has been smoking cigarettes.  She has been smoking about 0.00 packs per day. She has never used smokeless tobacco. She reports that she drinks alcohol. She reports that she does not use drugs.  ROS: UROLOGY Frequent Urination?: Yes Hard to postpone urination?: No Burning/pain with urination?: No Get up at night to urinate?: Yes Leakage of urine?: No Urine stream starts and stops?: Yes Trouble starting stream?: No Do you have to strain to urinate?: No Blood in urine?: Yes Urinary tract infection?: Yes Sexually transmitted disease?: No Injury to kidneys or bladder?: No Painful intercourse?: No Weak stream?: No Currently pregnant?: No Vaginal bleeding?: No  Gastrointestinal Nausea?: No Vomiting?:  No Indigestion/heartburn?: No Diarrhea?: No Constipation?: No  Constitutional Fever: No Night sweats?: No Weight loss?: No Fatigue?: No  Skin Skin rash/lesions?: No Itching?: No  Eyes Blurred vision?: No Double vision?: No  Ears/Nose/Throat Sore throat?: No Sinus problems?: No  Hematologic/Lymphatic Swollen glands?: No Easy bruising?: No  Cardiovascular Leg swelling?: No Chest pain?: No  Respiratory Cough?: No Shortness of breath?: No  Endocrine Excessive thirst?: No  Musculoskeletal Back pain?: No Joint pain?: No  Neurological Headaches?: No Dizziness?: No  Psychologic Depression?: No Anxiety?: No  Physical Exam: BP (!) 143/82   Pulse 82   Ht 5\' 6"  (1.676 m)   Wt 172 lb (78 kg)   BMI 27.76 kg/m   Constitutional:  Alert and oriented, No acute distress. HEENT: [place] AT, moist mucus membranes.  Trachea midline, no masses. Cardiovascular: No clubbing, cyanosis, or edema.  RRR Respiratory: Normal respiratory effort, no increased work of breathing.  Lungs clear GI: Abdomen is soft, nontender, nondistended, no abdominal masses GU: No CVA tenderness Lymph: No cervical or inguinal  lymphadenopathy. Skin: No rashes, bruises or suspicious lesions. Neurologic: Grossly intact, no focal deficits, moving all 4 extremities. Psychiatric: Normal mood and affect.  Laboratory Data: Lab Results  Component Value Date   WBC 6.5 06/16/2017   HGB 11.2 (L) 06/16/2017   HCT 34.3 (L) 06/16/2017   MCV 91.8 06/16/2017   PLT 227 06/16/2017    Lab Results  Component Value Date   CREATININE 0.96 06/16/2017    Urinalysis    Component Value Date/Time   COLORURINE YELLOW (A) 06/16/2017 0747   APPEARANCEUR Clear 06/18/2017 1140   LABSPEC 1.013 06/16/2017 0747   LABSPEC 1.012 02/07/2014 1350   PHURINE 6.0 06/16/2017 0747   GLUCOSEU Negative 06/18/2017 1140   GLUCOSEU Negative 02/07/2014 1350   HGBUR LARGE (A) 06/16/2017 0747   BILIRUBINUR Negative 06/18/2017 1140   BILIRUBINUR Negative 02/07/2014 1350   KETONESUR NEGATIVE 06/16/2017 0747   PROTEINUR Trace 06/18/2017 1140   PROTEINUR 30 (A) 06/16/2017 0747   UROBILINOGEN 0.2 05/26/2017 1450   NITRITE Negative 06/18/2017 1140   NITRITE NEGATIVE 06/16/2017 0747   LEUKOCYTESUR Trace 06/18/2017 1140   LEUKOCYTESUR Negative 02/07/2014 1350    Pertinent Imaging: CT images were personally reviewed  Assessment & Plan:   73 year old female with nephrolithiasis and obstructing 10 mm right mid ureteral calculus.  She was informed that her ureteral calculus will be too large to pass.  Management options were discussed including shockwave lithotripsy and ureteroscopy.  Based on stone size, location feel ureteroscopy would be the best option.  She has elected to proceed and is scheduled for next week.  The indications and nature of the planned procedure were discussed as well as the potential  benefits and expected outcome.  Alternatives have been discussed in detail. The most common complications and side effects were discussed including but not limited to infection/sepsis; blood loss; damage to urethra, bladder, ureter, kidney; need for  multiple surgeries; need for prolonged stent placement as well as general anesthesia risks. Although uncommon she was also informed of the possibility that the renal calculus may not be able to be treated due to inability to obtain access to the upper ureter. In that event she would require stent placement and a follow-up procedure after a period of stent dilation. All of her questions were answered and she desires to proceed.  At the time of our visit he she did not appear to be distracted or in pain.    Scott C  Stoioff, MD  [facility] [address], [address] [place], [place] [address] [phone]  

## 2017-06-21 LAB — CULTURE, URINE COMPREHENSIVE

## 2017-06-22 ENCOUNTER — Telehealth: Payer: Self-pay | Admitting: Urology

## 2017-06-22 ENCOUNTER — Encounter
Admission: RE | Admit: 2017-06-22 | Discharge: 2017-06-22 | Disposition: A | Discharge status: Home / Self Care | Payer: Medicare PPO | Source: Ambulatory Visit | Attending: Urology | Admitting: Urology

## 2017-06-22 ENCOUNTER — Encounter: Payer: Self-pay | Admitting: Urology

## 2017-06-22 ENCOUNTER — Other Ambulatory Visit: Payer: Self-pay

## 2017-06-22 ENCOUNTER — Other Ambulatory Visit: Payer: Self-pay | Admitting: Urology

## 2017-06-22 DIAGNOSIS — Z9049 Acquired absence of other specified parts of digestive tract: Secondary | ICD-10-CM | POA: Diagnosis not present

## 2017-06-22 DIAGNOSIS — I10 Essential (primary) hypertension: Secondary | ICD-10-CM | POA: Diagnosis not present

## 2017-06-22 DIAGNOSIS — Y838 Other surgical procedures as the cause of abnormal reaction of the patient, or of later complication, without mention of misadventure at the time of the procedure: Secondary | ICD-10-CM | POA: Diagnosis not present

## 2017-06-22 DIAGNOSIS — F17210 Nicotine dependence, cigarettes, uncomplicated: Secondary | ICD-10-CM | POA: Diagnosis not present

## 2017-06-22 DIAGNOSIS — Z79890 Hormone replacement therapy (postmenopausal): Secondary | ICD-10-CM | POA: Diagnosis not present

## 2017-06-22 DIAGNOSIS — Z01810 Encounter for preprocedural cardiovascular examination: Secondary | ICD-10-CM | POA: Insufficient documentation

## 2017-06-22 DIAGNOSIS — D649 Anemia, unspecified: Secondary | ICD-10-CM | POA: Diagnosis not present

## 2017-06-22 DIAGNOSIS — E039 Hypothyroidism, unspecified: Secondary | ICD-10-CM | POA: Diagnosis not present

## 2017-06-22 DIAGNOSIS — R0902 Hypoxemia: Secondary | ICD-10-CM | POA: Diagnosis not present

## 2017-06-22 DIAGNOSIS — Z79899 Other long term (current) drug therapy: Secondary | ICD-10-CM | POA: Diagnosis not present

## 2017-06-22 DIAGNOSIS — R001 Bradycardia, unspecified: Secondary | ICD-10-CM | POA: Insufficient documentation

## 2017-06-22 DIAGNOSIS — N9971 Accidental puncture and laceration of a genitourinary system organ or structure during a genitourinary system procedure: Secondary | ICD-10-CM | POA: Diagnosis not present

## 2017-06-22 DIAGNOSIS — J690 Pneumonitis due to inhalation of food and vomit: Secondary | ICD-10-CM | POA: Diagnosis not present

## 2017-06-22 DIAGNOSIS — N202 Calculus of kidney with calculus of ureter: Secondary | ICD-10-CM | POA: Diagnosis present

## 2017-06-22 HISTORY — DX: Hypothyroidism, unspecified: E03.9

## 2017-06-22 MED ORDER — HYDROCODONE-ACETAMINOPHEN 5-325 MG PO TABS: 1.0000 | ORAL_TABLET | Freq: Four times a day (QID) | ORAL | 0 refills | Status: DC | PRN

## 2017-06-22 MED ORDER — CEFAZOLIN SODIUM-DEXTROSE 2-4 GM/100ML-% IV SOLN
2.0000 g | INTRAVENOUS | Status: AC
  Administered 2017-06-23: 2 g via INTRAVENOUS

## 2017-06-22 NOTE — Progress Notes (Signed)
Prescription given for hydrocodone/APAP 5/325 mg, q 6 hours # 30.

## 2017-06-22 NOTE — Patient Instructions (Signed)
Your procedure is scheduled [id number] Report to Day Surgery. At 6:30  Remember: Instructions that are not followed completely may result in serious medical risk, up to and including death, or upon the discretion of your surgeon and anesthesiologist your surgery may need to be rescheduled.     _X__ 1. Do not eat food after midnight the night before your procedure.                 No gum chewing or hard candies. You may drink clear liquids up to 2 hours                 before you are scheduled to arrive for your surgery- DO not drink clear                 liquids within 2 hours of the start of your surgery.                 Clear Liquids include:  water, apple juice without pulp, clear carbohydrate                 drink such as Clearfast of [place], Black Coffee or Tea (Do not add                 anything to coffee or tea).  __X__2.  On the morning of surgery brush your teeth with toothpaste and water, you  may rinse your mouth with mouthwash if you wish.  Do not swallow any  toothpaste of mouthwash.     _X__ 3.  No Alcohol for 24 hours before or after surgery.   _X__ 4.  Do Not Smoke or use e-cigarettes For 24 Hours Prior to Your Surgery.                 Do not use any chewable tobacco products for at least 6 hours prior to                 surgery.  ____  5.  Bring all medications with you on the day of surgery if instructed.   __x__  6.  Notify your doctor if there is any change in your medical condition      (cold, fever, infections).     Do not wear jewelry, make-up, hairpins, clips or nail polish. Do not wear lotions, powders, or perfumes. You may wear deodorant. Do not shave 48 hours prior to surgery. Men may shave face and neck. Do not bring valuables to the hospital.    [facility] is not responsible for any belongings or valuables.  Contacts, dentures or bridgework may not be worn into surgery. Leave your suitcase in the car. After surgery it may be  brought to your room. For patients admitted to the hospital, discharge time is determined by your treatment team.   Patients discharged the day of surgery will not be allowed to drive home.   Please read over the following fact sheets that you were given:     __x__ Take these medicines the morning of surgery with A SIP OF WATER:    1. levothyroxine (SYNTHROID, LEVOTHROID) 100 MCG tablet  2. metoprolol succinate (TOPROL-XL) 100 MG 24 hr tablet  3. ondansetron (ZOFRAN) 4 MG tablet if needed  4.  5.  6.  ____ Fleet Enema (as directed)   ____ Use CHG Soap as directed  ____ Use inhalers on the day of surgery  ____ Stop metformin 2 days prior to surgery  ____ Take 1/2 of usual insulin dose the night before surgery. No insulin the morning          of surgery.   __x__ Stop  aspirin-acetaminophen-caffeine (EXCEDRIN MIGRAINE) 250-250-65 MG tablet today  __x__ Stop Anti-inflammatories on no ibuprofen or aleve,   May take tylenol   __x__ Stop supplements until after surgery. Garlic 3704 MG CAPS,Melatonin 5 MG TABS   ____ Bring C-Pap to the hospital.

## 2017-06-22 NOTE — Telephone Encounter (Signed)
Pt has appt at pre-admit this afternoon and would like to know if she can stop by our office and get a script for pain meds.  Please advise.

## 2017-06-23 ENCOUNTER — Other Ambulatory Visit: Payer: Self-pay

## 2017-06-23 ENCOUNTER — Encounter
Admission: RE | Disposition: A | Discharge status: Home / Self Care | Payer: Self-pay | Source: Ambulatory Visit | Attending: Internal Medicine

## 2017-06-23 ENCOUNTER — Ambulatory Visit: Payer: Medicare PPO | Admitting: Anesthesiology

## 2017-06-23 ENCOUNTER — Observation Stay
Admission: RE | Admit: 2017-06-23 | Discharge: 2017-06-26 | Disposition: A | Discharge status: Home / Self Care | Payer: Medicare PPO | Source: Ambulatory Visit | Attending: Internal Medicine | Admitting: Internal Medicine

## 2017-06-23 ENCOUNTER — Ambulatory Visit: Payer: Medicare PPO

## 2017-06-23 DIAGNOSIS — J690 Pneumonitis due to inhalation of food and vomit: Secondary | ICD-10-CM | POA: Insufficient documentation

## 2017-06-23 DIAGNOSIS — D649 Anemia, unspecified: Secondary | ICD-10-CM | POA: Insufficient documentation

## 2017-06-23 DIAGNOSIS — R0989 Other specified symptoms and signs involving the circulatory and respiratory systems: Secondary | ICD-10-CM

## 2017-06-23 DIAGNOSIS — Z79899 Other long term (current) drug therapy: Secondary | ICD-10-CM | POA: Insufficient documentation

## 2017-06-23 DIAGNOSIS — N200 Calculus of kidney: Secondary | ICD-10-CM

## 2017-06-23 DIAGNOSIS — N201 Calculus of ureter: Secondary | ICD-10-CM | POA: Diagnosis not present

## 2017-06-23 DIAGNOSIS — F17210 Nicotine dependence, cigarettes, uncomplicated: Secondary | ICD-10-CM | POA: Insufficient documentation

## 2017-06-23 DIAGNOSIS — Z79890 Hormone replacement therapy (postmenopausal): Secondary | ICD-10-CM | POA: Insufficient documentation

## 2017-06-23 DIAGNOSIS — R0902 Hypoxemia: Secondary | ICD-10-CM | POA: Diagnosis present

## 2017-06-23 DIAGNOSIS — N9971 Accidental puncture and laceration of a genitourinary system organ or structure during a genitourinary system procedure: Secondary | ICD-10-CM | POA: Insufficient documentation

## 2017-06-23 DIAGNOSIS — N202 Calculus of kidney with calculus of ureter: Secondary | ICD-10-CM | POA: Diagnosis not present

## 2017-06-23 DIAGNOSIS — Y838 Other surgical procedures as the cause of abnormal reaction of the patient, or of later complication, without mention of misadventure at the time of the procedure: Secondary | ICD-10-CM | POA: Insufficient documentation

## 2017-06-23 DIAGNOSIS — I10 Essential (primary) hypertension: Secondary | ICD-10-CM | POA: Insufficient documentation

## 2017-06-23 DIAGNOSIS — Z9049 Acquired absence of other specified parts of digestive tract: Secondary | ICD-10-CM | POA: Insufficient documentation

## 2017-06-23 DIAGNOSIS — E039 Hypothyroidism, unspecified: Secondary | ICD-10-CM | POA: Insufficient documentation

## 2017-06-23 HISTORY — PX: CYSTOSCOPY/URETEROSCOPY/HOLMIUM LASER/STENT PLACEMENT: SHX6546

## 2017-06-23 LAB — COMPREHENSIVE METABOLIC PANEL
ALT: 16 U/L (ref 14–54)
AST: 37 U/L (ref 15–41)
Albumin: 3.9 g/dL (ref 3.5–5.0)
Alkaline Phosphatase: 55 U/L (ref 38–126)
Anion gap: 10 (ref 5–15)
BUN: 14 mg/dL (ref 6–20)
CO2: 29 mmol/L (ref 22–32)
Calcium: 8.8 mg/dL — ABNORMAL LOW (ref 8.9–10.3)
Chloride: 102 mmol/L (ref 101–111)
Creatinine, Ser: 1.15 mg/dL — ABNORMAL HIGH (ref 0.44–1.00)
GFR calc Af Amer: 54 mL/min — ABNORMAL LOW (ref >60)
GFR calc non Af Amer: 47 mL/min — ABNORMAL LOW (ref >60)
Glucose, Bld: 155 mg/dL — ABNORMAL HIGH (ref 65–99)
Potassium: 3.7 mmol/L (ref 3.5–5.1)
Sodium: 141 mmol/L (ref 135–145)
Total Bilirubin: 0.5 mg/dL (ref 0.3–1.2)
Total Protein: 7.9 g/dL (ref 6.5–8.1)

## 2017-06-23 LAB — CBC
HCT: 31.9 % — ABNORMAL LOW (ref 35.0–47.0)
Hemoglobin: 10.4 g/dL — ABNORMAL LOW (ref 12.0–16.0)
MCH: 30 pg (ref 26.0–34.0)
MCHC: 32.5 g/dL (ref 32.0–36.0)
MCV: 92.1 fL (ref 80.0–100.0)
Platelets: 214 10*3/uL (ref 150–440)
RBC: 3.46 MIL/uL — ABNORMAL LOW (ref 3.80–5.20)
RDW: 14.2 % (ref 11.5–14.5)
WBC: 7.4 10*3/uL (ref 3.6–11.0)

## 2017-06-23 LAB — PROTIME-INR
INR: 1.01
Prothrombin Time: 13.2 seconds (ref 11.4–15.2)

## 2017-06-23 SURGERY — CYSTOSCOPY/URETEROSCOPY/HOLMIUM LASER/STENT PLACEMENT
Anesthesia: General | Site: Ureter | Laterality: Right | Wound class: Clean Contaminated

## 2017-06-23 MED ORDER — SODIUM CHLORIDE 0.9 % IV SOLN: 250.0000 mL | INTRAVENOUS | Status: DC | PRN

## 2017-06-23 MED ORDER — ASPIRIN-ACETAMINOPHEN-CAFFEINE 250-250-65 MG PO TABS
2.0000 | ORAL_TABLET | Freq: Two times a day (BID) | ORAL | Status: DC | PRN
  Administered 2017-06-23 – 2017-06-26 (×5): 2 via ORAL
  Filled 2017-06-23 (×6): qty 2

## 2017-06-23 MED ORDER — FENTANYL CITRATE (PF) 100 MCG/2ML IJ SOLN: 25.0000 ug | INTRAMUSCULAR | Status: DC | PRN

## 2017-06-23 MED ORDER — LIDOCAINE HCL (CARDIAC) 20 MG/ML IV SOLN
INTRAVENOUS | Status: DC | PRN
  Administered 2017-06-23: 100 mg via INTRAVENOUS

## 2017-06-23 MED ORDER — SUCCINYLCHOLINE CHLORIDE 20 MG/ML IJ SOLN
INTRAMUSCULAR | Status: DC | PRN
  Administered 2017-06-23: 20 mg via INTRAVENOUS
  Administered 2017-06-23: 100 mg via INTRAVENOUS

## 2017-06-23 MED ORDER — SUGAMMADEX SODIUM 200 MG/2ML IV SOLN
INTRAVENOUS | Status: DC | PRN
  Administered 2017-06-23: 160 mg via INTRAVENOUS

## 2017-06-23 MED ORDER — FENTANYL CITRATE (PF) 100 MCG/2ML IJ SOLN
INTRAMUSCULAR | Status: AC
  Filled 2017-06-23: qty 2

## 2017-06-23 MED ORDER — ACETAMINOPHEN 650 MG RE SUPP: 650.0000 mg | Freq: Four times a day (QID) | RECTAL | Status: DC | PRN

## 2017-06-23 MED ORDER — ONDANSETRON HCL 4 MG/2ML IJ SOLN: 4.0000 mg | Freq: Four times a day (QID) | INTRAMUSCULAR | Status: DC | PRN

## 2017-06-23 MED ORDER — ONDANSETRON HCL 4 MG/2ML IJ SOLN: 4.0000 mg | Freq: Once | INTRAMUSCULAR | Status: DC | PRN

## 2017-06-23 MED ORDER — FUROSEMIDE 10 MG/ML IJ SOLN
10.0000 mg | Freq: Once | INTRAMUSCULAR | Status: AC
  Administered 2017-06-23: 10 mg via INTRAVENOUS

## 2017-06-23 MED ORDER — MELATONIN 5 MG PO TABS
5.0000 mg | ORAL_TABLET | Freq: Every evening | ORAL | Status: DC | PRN
  Filled 2017-06-23: qty 1

## 2017-06-23 MED ORDER — PROPOFOL 10 MG/ML IV BOLUS
INTRAVENOUS | Status: AC
  Filled 2017-06-23: qty 20

## 2017-06-23 MED ORDER — FAMOTIDINE 20 MG PO TABS
ORAL_TABLET | ORAL | Status: AC
  Filled 2017-06-23: qty 1

## 2017-06-23 MED ORDER — OXYCODONE-ACETAMINOPHEN 5-325 MG PO TABS
1.0000 | ORAL_TABLET | Freq: Four times a day (QID) | ORAL | Status: DC | PRN
  Administered 2017-06-23 – 2017-06-24 (×4): 1 via ORAL
  Filled 2017-06-23 (×4): qty 1

## 2017-06-23 MED ORDER — MIDAZOLAM HCL 2 MG/2ML IJ SOLN
INTRAMUSCULAR | Status: AC
  Filled 2017-06-23: qty 2

## 2017-06-23 MED ORDER — EPHEDRINE SULFATE 50 MG/ML IJ SOLN
INTRAMUSCULAR | Status: DC | PRN
  Administered 2017-06-23: 10 mg via INTRAVENOUS

## 2017-06-23 MED ORDER — METOPROLOL SUCCINATE ER 100 MG PO TB24
100.0000 mg | ORAL_TABLET | Freq: Every day | ORAL | Status: DC
  Administered 2017-06-24 – 2017-06-26 (×3): 100 mg via ORAL
  Filled 2017-06-23 (×3): qty 1

## 2017-06-23 MED ORDER — CEFAZOLIN SODIUM-DEXTROSE 2-4 GM/100ML-% IV SOLN
INTRAVENOUS | Status: AC
  Filled 2017-06-23: qty 100

## 2017-06-23 MED ORDER — PROPOFOL 10 MG/ML IV BOLUS
INTRAVENOUS | Status: DC | PRN
  Administered 2017-06-23: 150 mg via INTRAVENOUS
  Administered 2017-06-23: 50 mg via INTRAVENOUS

## 2017-06-23 MED ORDER — GUAIFENESIN-DM 100-10 MG/5ML PO SYRP
5.0000 mL | ORAL_SOLUTION | ORAL | Status: DC | PRN
  Administered 2017-06-23 – 2017-06-24 (×3): 5 mL via ORAL
  Filled 2017-06-23 (×3): qty 5

## 2017-06-23 MED ORDER — ADULT MULTIVITAMIN W/MINERALS CH
1.0000 | ORAL_TABLET | Freq: Every day | ORAL | Status: DC
  Administered 2017-06-24 – 2017-06-26 (×3): 1 via ORAL
  Filled 2017-06-23 (×3): qty 1

## 2017-06-23 MED ORDER — CLINDAMYCIN PHOSPHATE 600 MG/50ML IV SOLN
600.0000 mg | Freq: Three times a day (TID) | INTRAVENOUS | Status: DC
  Administered 2017-06-23 – 2017-06-26 (×9): 600 mg via INTRAVENOUS
  Filled 2017-06-23 (×13): qty 50

## 2017-06-23 MED ORDER — FUROSEMIDE 10 MG/ML IJ SOLN
INTRAMUSCULAR | Status: AC
  Administered 2017-06-23: 10 mg via INTRAVENOUS
  Filled 2017-06-23: qty 2

## 2017-06-23 MED ORDER — DEXAMETHASONE SODIUM PHOSPHATE 10 MG/ML IJ SOLN
INTRAMUSCULAR | Status: DC | PRN
  Administered 2017-06-23: 10 mg via INTRAVENOUS

## 2017-06-23 MED ORDER — ROCURONIUM BROMIDE 100 MG/10ML IV SOLN
INTRAVENOUS | Status: DC | PRN
  Administered 2017-06-23: 10 mg via INTRAVENOUS
  Administered 2017-06-23: 20 mg via INTRAVENOUS

## 2017-06-23 MED ORDER — ONDANSETRON HCL 4 MG PO TABS
4.0000 mg | ORAL_TABLET | Freq: Four times a day (QID) | ORAL | Status: DC | PRN
  Filled 2017-06-23: qty 1

## 2017-06-23 MED ORDER — POLYVINYL ALCOHOL 1.4 % OP SOLN
1.0000 [drp] | Freq: Every day | OPHTHALMIC | Status: DC | PRN
  Filled 2017-06-23: qty 15

## 2017-06-23 MED ORDER — HYDROCODONE-ACETAMINOPHEN 5-325 MG PO TABS
1.0000 | ORAL_TABLET | Freq: Four times a day (QID) | ORAL | Status: DC | PRN
  Administered 2017-06-23: 1 via ORAL
  Filled 2017-06-23: qty 1

## 2017-06-23 MED ORDER — ACETAMINOPHEN 325 MG PO TABS
650.0000 mg | ORAL_TABLET | Freq: Four times a day (QID) | ORAL | Status: DC | PRN
  Administered 2017-06-23: 650 mg via ORAL
  Filled 2017-06-23: qty 2

## 2017-06-23 MED ORDER — KETOROLAC TROMETHAMINE 30 MG/ML IJ SOLN
30.0000 mg | Freq: Once | INTRAMUSCULAR | Status: DC
  Filled 2017-06-23: qty 1

## 2017-06-23 MED ORDER — SODIUM CHLORIDE 0.9% FLUSH
3.0000 mL | Freq: Two times a day (BID) | INTRAVENOUS | Status: DC
Start: 2017-06-23 — End: 2017-06-26
  Administered 2017-06-23 – 2017-06-26 (×6): 3 mL via INTRAVENOUS

## 2017-06-23 MED ORDER — FENTANYL CITRATE (PF) 100 MCG/2ML IJ SOLN
INTRAMUSCULAR | Status: DC | PRN
  Administered 2017-06-23 (×3): 50 ug via INTRAVENOUS

## 2017-06-23 MED ORDER — IPRATROPIUM-ALBUTEROL 0.5-2.5 (3) MG/3ML IN SOLN
RESPIRATORY_TRACT | Status: AC
  Filled 2017-06-23: qty 3

## 2017-06-23 MED ORDER — ENOXAPARIN SODIUM 40 MG/0.4ML ~~LOC~~ SOLN
40.0000 mg | SUBCUTANEOUS | Status: DC
  Administered 2017-06-24 – 2017-06-25 (×2): 40 mg via SUBCUTANEOUS
  Filled 2017-06-23 (×2): qty 0.4

## 2017-06-23 MED ORDER — IPRATROPIUM-ALBUTEROL 0.5-2.5 (3) MG/3ML IN SOLN
3.0000 mL | Freq: Once | RESPIRATORY_TRACT | Status: AC
Start: 2017-06-23 — End: 2017-06-23
  Administered 2017-06-23: 3 mL via RESPIRATORY_TRACT

## 2017-06-23 MED ORDER — IPRATROPIUM-ALBUTEROL 0.5-2.5 (3) MG/3ML IN SOLN
RESPIRATORY_TRACT | Status: AC
  Administered 2017-06-23: 3 mL via RESPIRATORY_TRACT
  Filled 2017-06-23: qty 3

## 2017-06-23 MED ORDER — MECLIZINE HCL 25 MG PO TABS
25.0000 mg | ORAL_TABLET | Freq: Four times a day (QID) | ORAL | Status: DC | PRN
  Filled 2017-06-23: qty 1

## 2017-06-23 MED ORDER — LEVOTHYROXINE SODIUM 100 MCG PO TABS
100.0000 ug | ORAL_TABLET | Freq: Every day | ORAL | Status: DC
  Administered 2017-06-24 – 2017-06-26 (×3): 100 ug via ORAL
  Filled 2017-06-23 (×3): qty 1

## 2017-06-23 MED ORDER — ONDANSETRON HCL 4 MG/2ML IJ SOLN
INTRAMUSCULAR | Status: DC | PRN
  Administered 2017-06-23: 4 mg via INTRAVENOUS

## 2017-06-23 MED ORDER — CLINDAMYCIN PHOSPHATE 600 MG/50ML IV SOLN
INTRAVENOUS | Status: AC
  Administered 2017-06-23: 600 mg via INTRAVENOUS
  Filled 2017-06-23: qty 50

## 2017-06-23 MED ORDER — LACTATED RINGERS IV SOLN
INTRAVENOUS | Status: DC
  Administered 2017-06-23: 07:00:00 via INTRAVENOUS

## 2017-06-23 MED ORDER — SENNOSIDES-DOCUSATE SODIUM 8.6-50 MG PO TABS: 1.0000 | ORAL_TABLET | Freq: Every evening | ORAL | Status: DC | PRN

## 2017-06-23 MED ORDER — GARLIC 1000 MG PO CAPS: 1000.0000 mg | ORAL_CAPSULE | Freq: Every day | ORAL | Status: DC

## 2017-06-23 MED ORDER — OXYBUTYNIN CHLORIDE 5 MG PO TABS: ORAL_TABLET | ORAL | 0 refills | Status: DC

## 2017-06-23 MED ORDER — CEFUROXIME AXETIL 500 MG PO TABS: 500.0000 mg | ORAL_TABLET | Freq: Two times a day (BID) | ORAL | 0 refills | Status: DC

## 2017-06-23 MED ORDER — FAMOTIDINE 20 MG PO TABS
20.0000 mg | ORAL_TABLET | Freq: Once | ORAL | Status: AC
  Administered 2017-06-23: 20 mg via ORAL

## 2017-06-23 MED ORDER — FERROUS SULFATE 325 (65 FE) MG PO TABS
325.0000 mg | ORAL_TABLET | Freq: Every day | ORAL | Status: DC
Start: 2017-06-23 — End: 2017-06-26
  Administered 2017-06-24 – 2017-06-26 (×3): 325 mg via ORAL
  Filled 2017-06-23 (×3): qty 1

## 2017-06-23 MED ORDER — SODIUM CHLORIDE 0.9% FLUSH: 3.0000 mL | INTRAVENOUS | Status: DC | PRN

## 2017-06-23 MED ORDER — KETOROLAC TROMETHAMINE 30 MG/ML IJ SOLN
INTRAMUSCULAR | Status: DC | PRN
  Administered 2017-06-23: 30 mg via INTRAVENOUS

## 2017-06-23 MED ORDER — IOTHALAMATE MEGLUMINE 43 % IV SOLN
INTRAVENOUS | Status: DC | PRN
  Administered 2017-06-23: 15 mL via URETHRAL

## 2017-06-23 MED ORDER — IPRATROPIUM-ALBUTEROL 0.5-2.5 (3) MG/3ML IN SOLN
3.0000 mL | Freq: Once | RESPIRATORY_TRACT | Status: AC
  Administered 2017-06-23: 3 mL via RESPIRATORY_TRACT

## 2017-06-23 MED ORDER — MIDAZOLAM HCL 2 MG/2ML IJ SOLN
INTRAMUSCULAR | Status: DC | PRN
  Administered 2017-06-23: 2 mg via INTRAVENOUS

## 2017-06-23 SURGICAL SUPPLY — 32 items
BAG DRAIN CYSTO-URO LG1000N (MISCELLANEOUS) ×2 IMPLANT
BASKET ZERO TIP 1.9FR (BASKET) ×1 IMPLANT
BRUSH SCRUB EZ 1% IODOPHOR (MISCELLANEOUS) ×2 IMPLANT
BSKT STON RTRVL ZERO TP 1.9FR (BASKET) ×1
CATH URETL 5X70 OPEN END (CATHETERS) ×2 IMPLANT
CNTNR SPEC 2.5X3XGRAD LEK (MISCELLANEOUS)
CONRAY 43 FOR UROLOGY 50M (MISCELLANEOUS) ×2 IMPLANT
CONT SPEC 4OZ STER OR WHT (MISCELLANEOUS)
CONT SPEC 4OZ STRL OR WHT (MISCELLANEOUS)
CONTAINER SPEC 2.5X3XGRAD LEK (MISCELLANEOUS) IMPLANT
DRAPE UTILITY 15X26 TOWEL STRL (DRAPES) ×2 IMPLANT
FIBER LASER LITHO 273 (Laser) ×1 IMPLANT
GLOVE BIO SURGEON STRL SZ8 (GLOVE) ×2 IMPLANT
GOWN STRL REUS W/ TWL LRG LVL3 (GOWN DISPOSABLE) ×2 IMPLANT
GOWN STRL REUS W/TWL LRG LVL3 (GOWN DISPOSABLE) ×4
GUIDEWIRE GREEN .038 145CM (MISCELLANEOUS) IMPLANT
INFUSOR MANOMETER BAG 3000ML (MISCELLANEOUS) ×2 IMPLANT
INTRODUCER DILATOR DOUBLE (INTRODUCER) IMPLANT
KIT TURNOVER CYSTO (KITS) ×2 IMPLANT
PACK CYSTO AR (MISCELLANEOUS) ×2 IMPLANT
SENSORWIRE 0.038 NOT ANGLED (WIRE) ×4
SET CYSTO W/LG BORE CLAMP LF (SET/KITS/TRAYS/PACK) ×2 IMPLANT
SHEATH URETERAL 12FRX35CM (MISCELLANEOUS) IMPLANT
SOL .9 NS 3000ML IRR  AL (IV SOLUTION) ×1
SOL .9 NS 3000ML IRR AL (IV SOLUTION) ×1
SOL .9 NS 3000ML IRR UROMATIC (IV SOLUTION) ×1 IMPLANT
STENT URET 6FRX22 CONTOUR (STENTS) ×1 IMPLANT
STENT URET 6FRX24 CONTOUR (STENTS) IMPLANT
STENT URET 6FRX26 CONTOUR (STENTS) IMPLANT
SURGILUBE 2OZ TUBE FLIPTOP (MISCELLANEOUS) ×2 IMPLANT
WATER STERILE IRR 1000ML POUR (IV SOLUTION) ×2 IMPLANT
WIRE SENSOR 0.038 NOT ANGLED (WIRE) ×2 IMPLANT

## 2017-06-23 NOTE — Progress Notes (Signed)
PHARMACIST - PHYSICIAN ORDER COMMUNICATION  CONCERNING: P&T Medication Policy on Herbal Medications  DESCRIPTION:  This patient's order for: GARLIC CAPSULES   has been noted.  This product(s) is classified as an "herbal" or natural product. Due to a lack of definitive safety studies or FDA approval, nonstandard manufacturing practices, plus the potential risk of unknown drug-drug interactions while on inpatient medications, the Pharmacy and Therapeutics Committee does not permit the use of "herbal" or natural products of this type within [facility].   ACTION TAKEN: The pharmacy department is unable to verify this order at this time. Please reevaluate patient's clinical condition at discharge and address if the herbal or natural product(s) should be resumed at that time.  Pernell Dupre, PharmD, BCPS Clinical Pharmacist 06/23/2017 3:24 PM

## 2017-06-23 NOTE — Interval H&P Note (Signed)
History and Physical Interval Note:  06/23/2017 7:18 AM  Melissa Fitzgerald  has presented today for surgery, with the diagnosis of right ureteral calculus, right nephrolithiasis  The various methods of treatment have been discussed with the patient and family. After consideration of risks, benefits and other options for treatment, the patient has consented to  Procedure(s): CYSTOSCOPY/URETEROSCOPY/HOLMIUM LASER/STENT PLACEMENT (Right) as a surgical intervention .  The patient's history has been reviewed, patient examined, no change in status, stable for surgery.  I have reviewed the patient's chart and labs.  Questions were answered to the patient's satisfaction.     Nara Visa

## 2017-06-23 NOTE — Anesthesia Procedure Notes (Signed)
Procedure Name: Intubation Date/Time: 06/23/2017 7:34 AM Performed by: Philbert Riser, CRNA Pre-anesthesia Checklist: Patient identified, Emergency Drugs available, Suction available, Patient being monitored and Timeout performed Patient Re-evaluated:Patient Re-evaluated prior to induction Oxygen Delivery Method: Circle system utilized and Simple face mask Preoxygenation: Pre-oxygenation with 100% oxygen Induction Type: IV induction Ventilation: Mask ventilation without difficulty Laryngoscope Size: McGraph and 3 Grade View: Grade III Tube type: Oral Tube size: 7.0 mm Number of attempts: 3 Airway Equipment and Method: Stylet Placement Confirmation: ETT inserted through vocal cords under direct vision,  positive ETCO2 and breath sounds checked- equal and bilateral Secured at: 19 cm Tube secured with: Tape Dental Injury: Teeth and Oropharynx as per pre-operative assessment  Difficulty Due To: Difficulty was unanticipated and Difficult Airway- due to anterior larynx Future Recommendations: Recommend- induction with short-acting agent, and alternative techniques readily available

## 2017-06-23 NOTE — Anesthesia Preprocedure Evaluation (Signed)
Anesthesia Evaluation  Patient identified by MRN, date of birth, ID band Patient awake    Reviewed: Allergy & Precautions, NPO status , Patient's Chart, lab work & pertinent test results, reviewed documented beta blocker date and time   Airway Mallampati: II  TM Distance: >3 FB     Dental  (+) Chipped   Pulmonary former smoker,           Cardiovascular hypertension, Pt. on medications and Pt. on home beta blockers      Neuro/Psych  Headaches, PSYCHIATRIC DISORDERS Depression    GI/Hepatic PUD, GERD  Controlled,  Endo/Other  Hypothyroidism   Renal/GU Renal disease     Musculoskeletal  (+) Arthritis ,   Abdominal   Peds  Hematology  (+) anemia ,   Anesthesia Other Findings   Reproductive/Obstetrics                             Anesthesia Physical Anesthesia Plan  ASA: III  Anesthesia Plan: General   Post-op Pain Management:    Induction: Intravenous  PONV Risk Score and Plan:   Airway Management Planned: LMA  Additional Equipment:   Intra-op Plan:   Post-operative Plan:   Informed Consent: I have reviewed the patients History and Physical, chart, labs and discussed the procedure including the risks, benefits and alternatives for the proposed anesthesia with the patient or authorized representative who has indicated his/her understanding and acceptance.     Plan Discussed with: CRNA  Anesthesia Plan Comments:         Anesthesia Quick Evaluation

## 2017-06-23 NOTE — Anesthesia Procedure Notes (Signed)
Procedure Name: Intubation Performed by: Fraya Ueda E, CRNA       

## 2017-06-23 NOTE — Progress Notes (Signed)
Advanced care plan.  Purpose of the Encounter: CODE STATUS  Parties in Attendance:Patient  Patient's Decision Capacity:Good  Subjective/Patient's story: came for cystoscopy and stent placement    Objective/Medical story Procedure done successfully Patient has low oxygen saturation and put on oxygen via nasal cannula.   Goals of care determination: Discussed with patient cardiac resuscitation, intubation and ventilator needs if the situation arises.  Patient wants everything done for now.    CODE STATUS: Full code   Time spent discussing advanced care planning: 16 minutes

## 2017-06-23 NOTE — Op Note (Signed)
Preoperative diagnosis: Right ureteral calculus  Postoperative diagnosis: Right ureteral calculus  Procedure:  1. Cystoscopy 2. Right ureteroscopy and stone removal 3. Ureteroscopic laser lithotripsy 4. Right ureteral stent placement (6 [place]) 22 cm 5. Right retrograde pyelography with interpretation  Surgeon: Nicki Reaper C. Stoioff, M.D.  Anesthesia: General  Complications: Ureteral perforation  Intraoperative findings:  1.  Right retrograde pyelography post procedure showed no filling defects, stone fragments.  Mild contrast extravasation noted.  EBL: Minimal  Specimens: 1. None   Indication: Melissa Fitzgerald is a 72 y.o. year old patient with renal colic secondary to a 10 mm obstructing right mid ureteral calculus. After reviewing the management options for treatment, the patient elected to proceed with the above surgical procedure(s). We have discussed the potential benefits and risks of the procedure, side effects of the proposed treatment, the likelihood of the patient achieving the goals of the procedure, and any potential problems that might occur during the procedure or recuperation. Informed consent has been obtained.  Description of procedure:  The patient was taken to the operating room and general anesthesia was induced.  The patient was placed in the dorsal lithotomy position, prepped and draped in the usual sterile fashion, and preoperative antibiotics were administered. A preoperative time-out was performed.   A 22 French cystoscope was lubricated and passed under direct vision.  The urethra was normal in appearance.  Panendoscopy was performed and the bladder mucosa showed no erythema, solid or papillary lesions.  Attention was directed to the right ureteral orifice and a 0.038 Sensor wire was then advanced up the right ureter into the renal pelvis under fluoroscopic guidance.  A 4.5 Fr semirigid ureteroscope was then advanced into the ureter next to the guidewire and  the calculus was identified.  The stone was impacted with marked inflammation of the ureteral mucosa.   The stone was then fragmented with the 273 micron holmium laser fiber on a setting of 0.8 J and frequency of 8 hz.   All stones were then removed from the ureter with a zero tip nitinol basket.  During fragment removal perforation of the ureter was noted with fat visible.  Reinspection of the ureter revealed no remaining visible stones or fragments.   The ureteroscope was advanced to the proximal ureter and retrograde pyelogram was performed through the ureteroscope with findings as described above.  She did have right nephrolithiasis on CT and due to her ureteral perforation it was elected not to place a ureteral access sheath and attempt removal of her right renal calculi.  The wire was then backloaded through the cystoscope and a ureteral stent was advance over the wire using Seldinger technique. A 6 FR/22 CM stent was was placed under fluoroscopic guidance.  The wire was then removed with an adequate stent curl noted in the renal pelvis as well as in the bladder.  The bladder was then emptied and the procedure ended.  The patient appeared to tolerate the procedure well and without complications.  After anesthetic reversal the patient was transported to the PACU in stable condition.

## 2017-06-23 NOTE — Anesthesia Postprocedure Evaluation (Signed)
Anesthesia Post Note  Patient: Melissa Fitzgerald  Procedure(s) Performed: CYSTOSCOPY/URETEROSCOPY/HOLMIUM LASER/STENT PLACEMENT (Right Ureter)  Patient location during evaluation: PACU Anesthesia Type: General Level of consciousness: awake and alert Pain management: pain level controlled Vital Signs Assessment: post-procedure vital signs reviewed and stable Respiratory status: spontaneous breathing, nonlabored ventilation, respiratory function stable and patient connected to nasal cannula oxygen Cardiovascular status: blood pressure returned to baseline and stable Postop Assessment: no apparent nausea or vomiting Anesthetic complications: no     Last Vitals:  Vitals:   06/23/17 1330 06/23/17 1345  BP: (!) 144/91 (!) 144/85  Pulse: 76 79  Resp: 20 17  Temp:  36.9 C  SpO2: 97% 94%    Last Pain:  Vitals:   06/23/17 1345  TempSrc:   PainSc: 0-No pain                 Lolah Coghlan S

## 2017-06-23 NOTE — Progress Notes (Signed)
Pt waking up and placed on oxygen 4 liters nasal cannula. Pt desated into the 70's and stated having some shortness of breath. Auscultated pt lungs and noted pt to have some crackles and some possible expiratory wheezes. Dr. Marcello Moores called and he came to see pt. Orders received. Breuna Loveall E 10:12 AM 06/23/2017

## 2017-06-23 NOTE — OR Nursing (Signed)
Dr Marcello Moores at bedside reviewed vital signs aware of n/v

## 2017-06-23 NOTE — Addendum Note (Signed)
Addendum  created 06/23/17 1440 by Gunnar Bulla, MD   Sign clinical note

## 2017-06-23 NOTE — Progress Notes (Signed)
Pt still having some crackles. Pt oxygen saturation dropped in to the mid 80's on room air. Dr. Marcello Moores notified. Acknowledged. Orders received. Verdell Kincannon E 10:58 AM 06/23/2017

## 2017-06-23 NOTE — Progress Notes (Signed)
Pt reports that norco medication "breaks her out and makes her nauseated". Notified on call Md who ordered percocet instead. Will administer percocet and continue to assess.

## 2017-06-23 NOTE — OR Nursing (Signed)
Patient presents this am with N/V that she as experienced  over the past week in relation to pain from kidney stones.

## 2017-06-23 NOTE — Transfer of Care (Signed)
Immediate Anesthesia Transfer of Care Note  Patient: Melissa Fitzgerald  Procedure(s) Performed: CYSTOSCOPY/URETEROSCOPY/HOLMIUM LASER/STENT PLACEMENT (Right Ureter)  Patient Location: PACU  Anesthesia Type:General  Level of Consciousness: awake and sedated  Airway & Oxygen Therapy: Patient Spontanous Breathing and Patient connected to face mask oxygen  Post-op Assessment: Report given to RN  Post vital signs: Reviewed and stable  Last Vitals:  Vitals Value Taken Time  BP 158/94 06/23/2017  9:18 AM  Temp 36.2 C 06/23/2017  9:18 AM  Pulse 75 06/23/2017  9:20 AM  Resp    SpO2 95 % 06/23/2017  9:20 AM  Vitals shown include unvalidated device data.  Last Pain:  Vitals:   06/23/17 0629  TempSrc: Temporal  PainSc: 9          Complications: No apparent anesthesia complications

## 2017-06-23 NOTE — H&P (Signed)
Melissa Fitzgerald at [place] NAME: Melissa Fitzgerald    MR#:  [id number]  DATE OF BIRTH:  25-Nov-1945  DATE OF ADMISSION:  06/23/2017  PRIMARY CARE PHYSICIAN: Chrismon, Vickki Muff, PA   REQUESTING/REFERRING PHYSICIAN:   CHIEF COMPLAINT:  Low oxygen saturation , referred by Anesthesia from PACU  HISTORY OF PRESENT ILLNESS: Melissa Fitzgerald  is a 72 y.o. female with a known history of anemia, hyperlipidemia, hypertension, hypothyroidism, thyroid disease, nephrolithiasis was referred by anesthesiologist Dr. Marcello Moores from PACU for low oxygen saturation.  Patient came from home for urology procedure as outpatient.  Had cystoscopy and stent placement.  She was intubated for the procedure and later on extubated she was found to be hypoxic with low oxygen saturation.  IV Lasix 20 mg was given in the PACU along with her duo nebulizations 2 episodes.  Patient oxygen saturation was still below 90% on room air she was put on oxygen via nasal cannula at 2 L.  Chest x-ray was done which showed evolving pneumonia.  Patient has cough.  No fever and chills.  Is awake and alert and responds to all verbal commands.  No complaints of any chest pain.  Hospitalist service was consulted for further care.  PAST MEDICAL HISTORY:   Past Medical History:  Diagnosis Date  . Anemia   . Colon polyp   . Depression   . Hyperlipidemia   . Hypertension   . Hypothyroidism   . Irritable bowel disease   . Kidney stone   . Thyroid disease     PAST SURGICAL HISTORY:  Past Surgical History:  Procedure Laterality Date  . ABDOMINAL HYSTERECTOMY    . BREAST ENHANCEMENT SURGERY    . BREAST SURGERY    . CHOLECYSTECTOMY  2000  . COLECTOMY  09/2013  . COLONOSCOPY  09-13-13   Dr [name]  . COLONOSCOPY WITH PROPOFOL N/A 10/18/2014   Procedure: COLONOSCOPY WITH PROPOFOL;  Surgeon: [name], MD;  Location: [facility] ENDOSCOPY;  Service: Endoscopy;  Laterality: N/A;  .  ESOPHAGOGASTRODUODENOSCOPY (EGD) WITH PROPOFOL N/A 01/03/2015   Procedure: ESOPHAGOGASTRODUODENOSCOPY (EGD) WITH PROPOFOL;  Surgeon: [name], MD;  Location: [facility] ENDOSCOPY;  Service: Endoscopy;  Laterality: N/A;  . EYE SURGERY     bilateral cataract   . FOOT SURGERY    . KIDNEY STONE SURGERY    . NASAL SINUS SURGERY    . PORT-A-CATH REMOVAL    . PORTACATH PLACEMENT  10/24/13  . THYROID SURGERY    . TONSILLECTOMY      SOCIAL HISTORY:  Social History   Tobacco Use  . Smoking status: Former Smoker    Packs/day: 0.00    Types: Cigarettes  . Smokeless tobacco: Never Used  . Tobacco comment: still smoking every now and then, rarely one  Substance Use Topics  . Alcohol use: Yes    Alcohol/week: 0.0 oz    Comment: occasionally    FAMILY HISTORY:  Family History  Problem Relation Age of Onset  . Heart disease Mother   . Diabetes Mother   . Arthritis Sister   . Hyperlipidemia Sister   . COPD Brother   . Diabetes Sister   . Colon cancer Unknown        first cousin  . Breast cancer Maternal Aunt   . Breast cancer Paternal Aunt     DRUG ALLERGIES:  Allergies  Allergen Reactions  . Codeine Nausea And Vomiting  . Oxycodone Nausea And Vomiting  . Penicillins  Nausea And Vomiting    Has patient had a PCN reaction causing immediate rash, facial/tongue/throat swelling, SOB or lightheadedness with hypotension: No Has patient had a PCN reaction causing severe rash involving mucus membranes or skin necrosis: No Has patient had a PCN reaction that required hospitalization: No Has patient had a PCN reaction occurring within the last 10 years: No If all of the above answers are "NO", then may proceed with Cephalosporin use.   . Tramadol Nausea And Vomiting  . Trintellix [Vortioxetine] Itching    REVIEW OF SYSTEMS:   CONSTITUTIONAL: No fever, fatigue or weakness.  EYES: No blurred or double vision.  EARS, NOSE, AND THROAT: No tinnitus or ear pain.  RESPIRATORY: Has  occasional cough, shortness of breath,  No wheezing or hemoptysis.  CARDIOVASCULAR: No chest pain, orthopnea, edema.  GASTROINTESTINAL: No nausea, vomiting, diarrhea or abdominal pain.  GENITOURINARY: No dysuria, hematuria.  ENDOCRINE: No polyuria, nocturia,  HEMATOLOGY: No anemia, easy bruising or bleeding SKIN: No rash or lesion. MUSCULOSKELETAL: No joint pain or arthritis.   NEUROLOGIC: No tingling, numbness, weakness.  PSYCHIATRY: No anxiety or depression.   MEDICATIONS AT HOME:  Prior to Admission medications   Medication Sig Start Date End Date Taking? Authorizing Provider  aspirin-acetaminophen-caffeine (EXCEDRIN MIGRAINE) [phone] MG tablet Take 2 tablets by mouth 2 (two) times daily as needed for headache or migraine.   Yes [provider]  carboxymethylcellulose (REFRESH PLUS) 0.5 % SOLN Place 1 drop into both eyes daily as needed (dry eyes).    Yes [provider]  diphenoxylate-atropine (LOMOTIL) 2.5-0.025 MG tablet TAKE ONE TABLET BY MOUTH THREE TIMES DAILY AS NEEDED FOR DIARRHEA OR LOOSE STOOLS 05/09/16  Yes Chrismon, Vickki Muff, PA  Ferrous Sulfate 28 MG TABS Take 28 mg by mouth daily.   Yes [provider]  Garlic 4431 MG CAPS Take 1,000 mg by mouth daily.   Yes [provider]  HYDROcodone-acetaminophen (NORCO) 5-325 MG tablet Take 1 tablet by mouth every 6 (six) hours as needed for moderate pain. 06/22/17  Yes McGowan, Larene Beach A, PA-C  levothyroxine (SYNTHROID, LEVOTHROID) 100 MCG tablet TAKE ONE TABLET BY MOUTH ONCE DAILY BEFORE BREAKFAST 02/11/17  Yes Chrismon, Vickki Muff, PA  meclizine (ANTIVERT) 25 MG tablet Take 1 tablet (25 mg total) by mouth 4 (four) times daily as needed for dizziness. Nausea or anxiety. 05/11/15  Yes Chrismon, Vickki Muff, PA  Melatonin 5 MG TABS Take 5 mg by mouth at bedtime as needed (sleep).   Yes [provider]  Menthol, Topical Analgesic, (BIOFREEZE EX) Apply 1 application topically daily as needed (pain).    Yes [provider]  metoprolol succinate (TOPROL-XL) 100 MG 24 hr tablet Take 1 tablet (100 mg total) by mouth daily. Take with or immediately following a meal. 05/26/17  Yes Chrismon, Vickki Muff, PA  Multiple Vitamin (MULTIVITAMIN WITH MINERALS) TABS tablet Take 1 tablet by mouth daily.   Yes [provider]  ondansetron (ZOFRAN) 4 MG tablet TAKE ONE TABLET BY MOUTH EVERY 6 HOURS AS NEEDED FOR CHEMOTHERAPY INDUCED NAUSEA AND VOMINTING 10/29/16  Yes Holley Bouche, NP  Potassium 99 MG TABS Take 99 mg by mouth daily.   Yes [provider]  tamsulosin (FLOMAX) 0.4 MG CAPS capsule Take 1 capsule (0.4 mg total) by mouth daily. Patient not taking: Reported on 06/22/2017 06/16/17  Yes Nena Polio, MD  cefUROXime (CEFTIN) 500 MG tablet Take 1 tablet (500 mg total) by mouth 2 (two) times daily with a meal  for 5 days. 06/23/17 06/28/17  Abbie Sons, MD  oxybutynin (DITROPAN) 5 MG tablet 1 tab tid prn frequency,urgency, bladder spasm 06/23/17   Stoioff, Ronda Fairly, MD      PHYSICAL EXAMINATION:   VITAL SIGNS: Blood pressure (!) 147/96, pulse 78, temperature (!) 97.4 F (36.3 C), resp. rate 17, weight 78 kg (172 lb), SpO2 96 %.  GENERAL:  72 y.o.-year-old patient lying in the bed with no acute distress.  EYES: Pupils equal, round, reactive to light and accommodation. No scleral icterus. Extraocular muscles intact.  HEENT: Head atraumatic, normocephalic. Oropharynx and nasopharynx clear.  NECK:  Supple, no jugular venous distention. No thyroid enlargement, no tenderness.  LUNGS: Decreased breath sounds bilaterally, no wheezing,right lung rales heard. No use of accessory muscles of respiration.  CARDIOVASCULAR: S1, S2 normal. No murmurs, rubs, or gallops.  ABDOMEN: Soft, nontender, nondistended. Bowel sounds present. No organomegaly or mass.  EXTREMITIES: No pedal edema, cyanosis, or clubbing.  NEUROLOGIC: Cranial nerves II through XII are intact. Muscle strength 5/5 in all  extremities. Sensation intact. Gait not checked.  PSYCHIATRIC: The patient is alert and oriented x 3.  SKIN: No obvious rash, lesion, or ulcer.   LABORATORY PANEL:   CBC No results for input(s): WBC, HGB, HCT, PLT, MCV, MCH, MCHC, RDW, LYMPHSABS, MONOABS, EOSABS, BASOSABS, BANDABS in the last 168 hours.  Invalid input(s): NEUTRABS, BANDSABD ------------------------------------------------------------------------------------------------------------------  Chemistries  No results for input(s): NA, K, CL, CO2, GLUCOSE, BUN, CREATININE, CALCIUM, MG, AST, ALT, ALKPHOS, BILITOT in the last 168 hours.  Invalid input(s): GFRCGP ------------------------------------------------------------------------------------------------------------------ estimated creatinine clearance is 56.7 mL/min (by C-G formula based on SCr of 0.96 mg/dL). ------------------------------------------------------------------------------------------------------------------ No results for input(s): TSH, T4TOTAL, T3FREE, THYROIDAB in the last 72 hours.  Invalid input(s): FREET3   Coagulation profile No results for input(s): INR, PROTIME in the last 168 hours. ------------------------------------------------------------------------------------------------------------------- No results for input(s): DDIMER in the last 72 hours. -------------------------------------------------------------------------------------------------------------------  Cardiac Enzymes No results for input(s): CKMB, TROPONINI, MYOGLOBIN in the last 168 hours.  Invalid input(s): CK ------------------------------------------------------------------------------------------------------------------ Invalid input(s): POCBNP  ---------------------------------------------------------------------------------------------------------------  Urinalysis    Component Value Date/Time   COLORURINE YELLOW (A) 06/16/2017 0747   APPEARANCEUR Clear 06/18/2017  1140   LABSPEC 1.013 06/16/2017 0747   LABSPEC 1.012 02/07/2014 1350   PHURINE 6.0 06/16/2017 0747   GLUCOSEU Negative 06/18/2017 1140   GLUCOSEU Negative 02/07/2014 1350   HGBUR LARGE (A) 06/16/2017 0747   BILIRUBINUR Negative 06/18/2017 1140   BILIRUBINUR Negative 02/07/2014 [place] 06/16/2017 0747   PROTEINUR Trace 06/18/2017 1140   PROTEINUR 30 (A) 06/16/2017 0747   UROBILINOGEN 0.2 05/26/2017 1450   NITRITE Negative 06/18/2017 1140   NITRITE NEGATIVE 06/16/2017 0747   LEUKOCYTESUR Trace 06/18/2017 1140   LEUKOCYTESUR Negative 02/07/2014 1350     RADIOLOGY: X-ray Chest Pa Or Ap  Result Date: 06/23/2017 CLINICAL DATA:  Chest crackles EXAM: CHEST  1 VIEW COMPARISON:  Chest x-ray of 08/15/2016 FINDINGS: There are prominent markings in the mid right lung and right lung base which may represent developing pneumonia. The left lung appears clear. No pleural effusion is seen. Mediastinal and hilar contours are unremarkable and cardiomegaly is stable. No acute bony abnormality is seen. IMPRESSION: Prominent markings in the right mid lung and right lung base suspicious for developing pneumonia. Recommend follow-up with two-view chest x-ray. Electronically Signed   By: Ivar Drape M.D.   On: 06/23/2017 12:18    EKG: Orders placed or performed during the hospital encounter of 06/22/17  .  EKG 12-Lead  . EKG 12-Lead    IMPRESSION AND PLAN:  72 year old female patient with history of hyperlipidemia, hypertension, hypothyroidism, nephrolithiasis  was referred from PACU for low oxygen saturation.  Patient had a cystoscopy and stent placement today by urology.  1.  Hypoxia Oxygen via nasal cannula at 2 L  2.  Aspiration pneumonia Start patient on IV clindamycin antibiotic  3.  Hypothyroidism Resume oral levothyroxine  4.  Hypertension  restart oral metoprolol for control of blood pressure  5.  DVT prophylaxis with subcu Lovenox daily   All the records are  reviewed and case discussed with ED provider. Management plans discussed with the patient, family and they are in agreement.  CODE STATUS:FULL CODE    TOTAL TIME TAKING CARE OF THIS PATIENT: 50 minutes.    Saundra Shelling M.D on 06/23/2017 at 12:57 PM  Between 7am to 6pm - Pager - [phone]  After 6pm go to [email or web address] - password [email or web address] [facility]  Office  [phone]  CC: Primary care physician; [name], PA

## 2017-06-23 NOTE — Anesthesia Post-op Follow-up Note (Signed)
Anesthesia QCDR form completed.        

## 2017-06-23 NOTE — Care Management (Signed)
Oxygenation still poor, after SVN and lasix. Medical consult has decided to admit her.

## 2017-06-24 DIAGNOSIS — N202 Calculus of kidney with calculus of ureter: Secondary | ICD-10-CM | POA: Diagnosis not present

## 2017-06-24 LAB — BASIC METABOLIC PANEL
Anion gap: 10 (ref 5–15)
BUN: 25 mg/dL — ABNORMAL HIGH (ref 6–20)
CO2: 29 mmol/L (ref 22–32)
Calcium: 8.6 mg/dL — ABNORMAL LOW (ref 8.9–10.3)
Chloride: 103 mmol/L (ref 101–111)
Creatinine, Ser: 1.21 mg/dL — ABNORMAL HIGH (ref 0.44–1.00)
GFR calc Af Amer: 51 mL/min — ABNORMAL LOW (ref >60)
GFR calc non Af Amer: 44 mL/min — ABNORMAL LOW (ref >60)
Glucose, Bld: 121 mg/dL — ABNORMAL HIGH (ref 65–99)
Potassium: 3.7 mmol/L (ref 3.5–5.1)
Sodium: 142 mmol/L (ref 135–145)

## 2017-06-24 LAB — CBC
HCT: 28.5 % — ABNORMAL LOW (ref 35.0–47.0)
Hemoglobin: 9.4 g/dL — ABNORMAL LOW (ref 12.0–16.0)
MCH: 30 pg (ref 26.0–34.0)
MCHC: 32.9 g/dL (ref 32.0–36.0)
MCV: 91.1 fL (ref 80.0–100.0)
Platelets: 187 10*3/uL (ref 150–440)
RBC: 3.13 MIL/uL — ABNORMAL LOW (ref 3.80–5.20)
RDW: 13.9 % (ref 11.5–14.5)
WBC: 7.5 10*3/uL (ref 3.6–11.0)

## 2017-06-24 MED ORDER — CEPASTAT 14.5 MG MT LOZG
1.0000 | LOZENGE | OROMUCOSAL | Status: DC | PRN
  Administered 2017-06-24: 1 via BUCCAL
  Filled 2017-06-24: qty 9

## 2017-06-24 NOTE — Progress Notes (Signed)
[place] at [place] NAME: Melissa Fitzgerald    MR#:  [id number]  DATE OF BIRTH:  1945-05-14  SUBJECTIVE:  CHIEF COMPLAINT: Patient's pulse ox is better, reports coughing  REVIEW OF SYSTEMS:  CONSTITUTIONAL: No fever, fatigue or weakness.  EYES: No blurred or double vision.  EARS, NOSE, AND THROAT: No tinnitus or ear pain.  RESPIRATORY: Reports cough, improved shortness of breath, wheezing or hemoptysis.  CARDIOVASCULAR: No chest pain, orthopnea, edema.  GASTROINTESTINAL: No nausea, vomiting, diarrhea or abdominal pain.  GENITOURINARY: No dysuria, hematuria.  ENDOCRINE: No polyuria, nocturia,  HEMATOLOGY: No anemia, easy bruising or bleeding SKIN: No rash or lesion. MUSCULOSKELETAL: No joint pain or arthritis.   NEUROLOGIC: No tingling, numbness, weakness.  PSYCHIATRY: No anxiety or depression.   DRUG ALLERGIES:   Allergies  Allergen Reactions  . Codeine Nausea And Vomiting  . Oxycodone Nausea And Vomiting  . Penicillins Nausea And Vomiting    Has patient had a PCN reaction causing immediate rash, facial/tongue/throat swelling, SOB or lightheadedness with hypotension: No Has patient had a PCN reaction causing severe rash involving mucus membranes or skin necrosis: No Has patient had a PCN reaction that required hospitalization: No Has patient had a PCN reaction occurring within the last 10 years: No If all of the above answers are "NO", then may proceed with Cephalosporin use.   . Tramadol Nausea And Vomiting  . Trintellix [Vortioxetine] Itching    VITALS:  Blood pressure 139/88, pulse 70, temperature 98.2 F (36.8 C), temperature source Oral, resp. rate 20, height 5\' 5"  (1.651 m), weight 78 kg (172 lb), SpO2 95 %.  PHYSICAL EXAMINATION:  GENERAL:  72 y.o.-year-old patient lying in the bed with no acute distress.  EYES: Pupils equal, round, reactive to light and accommodation. No scleral icterus. Extraocular muscles intact.   HEENT: Head atraumatic, normocephalic. Oropharynx and nasopharynx clear.  NECK:  Supple, no jugular venous distention. No thyroid enlargement, no tenderness.  LUNGS: Normal breath sounds bilaterally, no wheezing, rales,rhonchi or crepitation. No use of accessory muscles of respiration.  CARDIOVASCULAR: S1, S2 normal. No murmurs, rubs, or gallops.  ABDOMEN: Soft, nontender, nondistended. Bowel sounds present.  EXTREMITIES: No pedal edema, cyanosis, or clubbing.  NEUROLOGIC: Cranial nerves II through XII are intact. Muscle strength 5/5 in all extremities. Sensation intact. Gait not checked.  PSYCHIATRIC: The patient is alert and oriented x 3.  SKIN: No obvious rash, lesion, or ulcer.    LABORATORY PANEL:   CBC Recent Labs  Lab 06/24/17 0624  WBC 7.5  HGB 9.4*  HCT 28.5*  PLT 187   ------------------------------------------------------------------------------------------------------------------  Chemistries  Recent Labs  Lab 06/23/17 1400 06/24/17 0624  NA 141 142  K 3.7 3.7  CL 102 103  CO2 29 29  GLUCOSE 155* 121*  BUN 14 25*  CREATININE 1.15* 1.21*  CALCIUM 8.8* 8.6*  AST 37  --   ALT 16  --   ALKPHOS 55  --   BILITOT 0.5  --    ------------------------------------------------------------------------------------------------------------------  Cardiac Enzymes No results for input(s): TROPONINI in the last 168 hours. ------------------------------------------------------------------------------------------------------------------  RADIOLOGY:  X-ray Chest Pa Or Ap  Result Date: 06/23/2017 CLINICAL DATA:  Chest crackles EXAM: CHEST  1 VIEW COMPARISON:  Chest x-ray of 08/15/2016 FINDINGS: There are prominent markings in the mid right lung and right lung base which may represent developing pneumonia. The left lung appears clear. No pleural effusion is seen. Mediastinal and hilar contours are unremarkable and cardiomegaly is  stable. No acute bony abnormality is seen.  IMPRESSION: Prominent markings in the right mid lung and right lung base suspicious for developing pneumonia. Recommend follow-up with two-view chest x-ray. Electronically Signed   By: Ivar Drape M.D.   On: 06/23/2017 12:18    EKG:   Orders placed or performed during the hospital encounter of 06/22/17  . EKG 12-Lead  . EKG 12-Lead    ASSESSMENT AND PLAN:   72 year old female patient with history of hyperlipidemia, hypertension, hypothyroidism, nephrolithiasis  was referred from PACU for low oxygen saturation.  Patient had a cystoscopy and stent placement today by urology.  1.  Hypoxia secondary to aspiration pneumonia Oxygen via nasal cannula at 2 L wean off as tolerated,  2.  Aspiration pneumonia  patient on IV clindamycin antibiotic  3.  Hypothyroidism Resume oral levothyroxine  4.  Hypertension  oral metoprolol for control of blood pressure\  5.  Status post right ureteroscopic stone removal secondary to stone impaction Leave the stent for 6 weeks and outpatient urology follow-up.  5.  DVT prophylaxis with subcu Lovenox daily       All the records are reviewed and case discussed with Care Management/Social Workerr. Management plans discussed with the patient, family and they are in agreement.  CODE STATUS: fc   TOTAL TIME TAKING CARE OF THIS PATIENT: 36  minutes.   POSSIBLE D/C IN 1-2 DAYS, DEPENDING ON CLINICAL CONDITION.  Note: This dictation was prepared with Dragon dictation along with smaller phrase technology. Any transcriptional errors that result from this process are unintentional.   Nicholes Mango M.D on 06/24/2017 at 5:14 PM  Between 7am to 6pm - Pager - [phone] After 6pm go to [email or web address] - password [email or web address] [facility]  Office  [phone]  CC: Primary care physician; [name], PA

## 2017-06-24 NOTE — Care Management Obs Status (Signed)
[place] NOTIFICATION   Patient Details  Name: Melissa Fitzgerald MRN: [id number] Date of Birth: 12-14-1945   Medicare Observation Status Notification Given:  Yes    Beverly Sessions, RN 06/24/2017, 2:44 PM

## 2017-06-24 NOTE — Progress Notes (Signed)
Urology Admitted by hospitalist for postop hypoxia and possible aspiration pneumonia.  She states she is feeling better.  Her flank pain has resolved.  No significant stent irritative symptoms.  Afebrile  Impression: Status post right ureteroscopic stone removal with postop hypoxia.  Recommendation: As per hospitalist team.  She had ureteral perforation due to stone impaction and will leave her stent indwelling for 6 weeks.

## 2017-06-25 DIAGNOSIS — N202 Calculus of kidney with calculus of ureter: Secondary | ICD-10-CM | POA: Diagnosis not present

## 2017-06-25 LAB — BASIC METABOLIC PANEL
Anion gap: 7 (ref 5–15)
BUN: 32 mg/dL — ABNORMAL HIGH (ref 6–20)
CO2: 31 mmol/L (ref 22–32)
Calcium: 8.8 mg/dL — ABNORMAL LOW (ref 8.9–10.3)
Chloride: 103 mmol/L (ref 101–111)
Creatinine, Ser: 1.17 mg/dL — ABNORMAL HIGH (ref 0.44–1.00)
GFR calc Af Amer: 53 mL/min — ABNORMAL LOW (ref >60)
GFR calc non Af Amer: 46 mL/min — ABNORMAL LOW (ref >60)
Glucose, Bld: 102 mg/dL — ABNORMAL HIGH (ref 65–99)
Potassium: 3.7 mmol/L (ref 3.5–5.1)
Sodium: 141 mmol/L (ref 135–145)

## 2017-06-25 LAB — CBC
HCT: 28.7 % — ABNORMAL LOW (ref 35.0–47.0)
Hemoglobin: 9.4 g/dL — ABNORMAL LOW (ref 12.0–16.0)
MCH: 30.1 pg (ref 26.0–34.0)
MCHC: 32.9 g/dL (ref 32.0–36.0)
MCV: 91.4 fL (ref 80.0–100.0)
Platelets: 197 10*3/uL (ref 150–440)
RBC: 3.14 MIL/uL — ABNORMAL LOW (ref 3.80–5.20)
RDW: 13.9 % (ref 11.5–14.5)
WBC: 6.7 10*3/uL (ref 3.6–11.0)

## 2017-06-25 MED ORDER — MENTHOL 3 MG MT LOZG
1.0000 | LOZENGE | OROMUCOSAL | Status: DC | PRN
  Administered 2017-06-25: 3 mg via ORAL
  Filled 2017-06-25: qty 9

## 2017-06-25 MED ORDER — DEXTROMETHORPHAN POLISTIREX ER 30 MG/5ML PO SUER: 15.0000 mg | Freq: Four times a day (QID) | ORAL | Status: DC | PRN

## 2017-06-25 NOTE — Progress Notes (Signed)
[place] at [place] NAME: Rylei Masella    MR#:  [id number]  DATE OF BIRTH:  08/06/1945  SUBJECTIVE:  CHIEF COMPLAINT: Patient's pulse ox is better, reports exertional dyspnea  REVIEW OF SYSTEMS:  CONSTITUTIONAL: No fever, fatigue or weakness.  EYES: No blurred or double vision.  EARS, NOSE, AND THROAT: No tinnitus or ear pain.  RESPIRATORY: Reports cough, improved shortness of breath, wheezing or hemoptysis.  CARDIOVASCULAR: No chest pain, orthopnea, edema.  GASTROINTESTINAL: No nausea, vomiting, diarrhea or abdominal pain.  GENITOURINARY: No dysuria, hematuria.  ENDOCRINE: No polyuria, nocturia,  HEMATOLOGY: No anemia, easy bruising or bleeding SKIN: No rash or lesion. MUSCULOSKELETAL: No joint pain or arthritis.   NEUROLOGIC: No tingling, numbness, weakness.  PSYCHIATRY: No anxiety or depression.   DRUG ALLERGIES:   Allergies  Allergen Reactions  . Codeine Nausea And Vomiting  . Oxycodone Nausea And Vomiting  . Penicillins Nausea And Vomiting    Has patient had a PCN reaction causing immediate rash, facial/tongue/throat swelling, SOB or lightheadedness with hypotension: No Has patient had a PCN reaction causing severe rash involving mucus membranes or skin necrosis: No Has patient had a PCN reaction that required hospitalization: No Has patient had a PCN reaction occurring within the last 10 years: No If all of the above answers are "NO", then may proceed with Cephalosporin use.   . Tramadol Nausea And Vomiting  . Trintellix [Vortioxetine] Itching    VITALS:  Blood pressure 126/78, pulse 70, temperature 97.7 F (36.5 C), temperature source Oral, resp. rate 20, height 5\' 5"  (1.651 m), weight 78 kg (172 lb), SpO2 93 %.  PHYSICAL EXAMINATION:  GENERAL:  72 y.o.-year-old patient lying in the bed with no acute distress.  EYES: Pupils equal, round, reactive to light and accommodation. No scleral icterus. Extraocular  muscles intact.  HEENT: Head atraumatic, normocephalic. Oropharynx and nasopharynx clear.  NECK:  Supple, no jugular venous distention. No thyroid enlargement, no tenderness.  LUNGS: Normal breath sounds bilaterally, no wheezing, rales,rhonchi or crepitation. No use of accessory muscles of respiration.  CARDIOVASCULAR: S1, S2 normal. No murmurs, rubs, or gallops.  ABDOMEN: Soft, nontender, nondistended. Bowel sounds present.  EXTREMITIES: No pedal edema, cyanosis, or clubbing.  NEUROLOGIC: Cranial nerves II through XII are intact. Muscle strength 5/5 in all extremities. Sensation intact. Gait not checked.  PSYCHIATRIC: The patient is alert and oriented x 3.  SKIN: No obvious rash, lesion, or ulcer.    LABORATORY PANEL:   CBC Recent Labs  Lab 06/25/17 0636  WBC 6.7  HGB 9.4*  HCT 28.7*  PLT 197   ------------------------------------------------------------------------------------------------------------------  Chemistries  Recent Labs  Lab 06/23/17 1400  06/25/17 0636  NA 141   < > 141  K 3.7   < > 3.7  CL 102   < > 103  CO2 29   < > 31  GLUCOSE 155*   < > 102*  BUN 14   < > 32*  CREATININE 1.15*   < > 1.17*  CALCIUM 8.8*   < > 8.8*  AST 37  --   --   ALT 16  --   --   ALKPHOS 55  --   --   BILITOT 0.5  --   --    < > = values in this interval not displayed.   ------------------------------------------------------------------------------------------------------------------  Cardiac Enzymes No results for input(s): TROPONINI in the last 168 hours. ------------------------------------------------------------------------------------------------------------------  RADIOLOGY:  X-ray Chest Pa Or Ap  Result Date: 06/23/2017 CLINICAL DATA:  Chest crackles EXAM: CHEST  1 VIEW COMPARISON:  Chest x-ray of 08/15/2016 FINDINGS: There are prominent markings in the mid right lung and right lung base which may represent developing pneumonia. The left lung appears clear. No pleural  effusion is seen. Mediastinal and hilar contours are unremarkable and cardiomegaly is stable. No acute bony abnormality is seen. IMPRESSION: Prominent markings in the right mid lung and right lung base suspicious for developing pneumonia. Recommend follow-up with two-view chest x-ray. Electronically Signed   By: Ivar Drape M.D.   On: 06/23/2017 12:18    EKG:   Orders placed or performed during the hospital encounter of 06/22/17  . EKG 12-Lead  . EKG 12-Lead    ASSESSMENT AND PLAN:   72 year old female patient with history of hyperlipidemia, hypertension, hypothyroidism, nephrolithiasis  was referred from PACU for low oxygen saturation.  Patient had a cystoscopy and stent placement today by urology.  1.  Hypoxia secondary to aspiration pneumonia Oxygen via nasal cannula at 2 L weaned off to RA  2.  Aspiration pneumonia  patient on IV clindamycin antibiotic CEPACHOL prn  3.  Hypothyroidism Resume oral levothyroxine  4.  Hypertension  oral metoprolol for control of blood pressure\  5.  Status post right ureteroscopic stone removal secondary to stone impaction Leave the stent for 6 weeks and outpatient urology follow-up.  5.  DVT prophylaxis with subcu Lovenox daily       All the records are reviewed and case discussed with Care Management/Social Workerr. Management plans discussed with the patient, family and they are in agreement.  CODE STATUS: fc   TOTAL TIME TAKING CARE OF THIS PATIENT: 36  minutes.   POSSIBLE D/C IN 1 DAYS, DEPENDING ON CLINICAL CONDITION.  Note: This dictation was prepared with Dragon dictation along with smaller phrase technology. Any transcriptional errors that result from this process are unintentional.   Nicholes Mango M.D on 06/25/2017 at 9:27 AM  Between 7am to 6pm - Pager - [phone] After 6pm go to [email or web address] - password [email or web address] [facility]  Office  [phone]  CC: Primary care physician; [name], PA

## 2017-06-26 DIAGNOSIS — N202 Calculus of kidney with calculus of ureter: Secondary | ICD-10-CM | POA: Diagnosis not present

## 2017-06-26 MED ORDER — CLINDAMYCIN HCL 300 MG PO CAPS: 300.0000 mg | ORAL_CAPSULE | Freq: Three times a day (TID) | ORAL | 0 refills | Status: AC

## 2017-06-26 MED ORDER — ASPIRIN-ACETAMINOPHEN-CAFFEINE 250-250-65 MG PO TABS: 2.0000 | ORAL_TABLET | Freq: Two times a day (BID) | ORAL | 0 refills | Status: AC | PRN

## 2017-06-26 MED ORDER — OXYCODONE HCL 5 MG PO TABS: 5.0000 mg | ORAL_TABLET | Freq: Four times a day (QID) | ORAL | Status: DC | PRN

## 2017-06-26 MED ORDER — CEPASTAT 14.5 MG MT LOZG: 1.0000 | LOZENGE | OROMUCOSAL | 0 refills | Status: DC | PRN

## 2017-06-26 MED ORDER — DEXTROMETHORPHAN POLISTIREX ER 30 MG/5ML PO SUER: 15.0000 mg | Freq: Four times a day (QID) | ORAL | 0 refills | Status: DC | PRN

## 2017-06-26 MED ORDER — OXYBUTYNIN CHLORIDE 5 MG PO TABS: ORAL_TABLET | ORAL | 0 refills | Status: DC

## 2017-06-26 MED ORDER — ACETAMINOPHEN 650 MG RE SUPP: 650.0000 mg | Freq: Four times a day (QID) | RECTAL | 0 refills | Status: DC | PRN

## 2017-06-26 MED ORDER — OXYCODONE HCL 5 MG PO TABS: 5.0000 mg | ORAL_TABLET | Freq: Four times a day (QID) | ORAL | 0 refills | Status: DC | PRN

## 2017-06-26 NOTE — Discharge Instructions (Signed)
°  With primary care physician in a week Follow-up with urology [place] on may 1  [place]   1) The drugs that you were given will stay in your system until tomorrow so for the next 24 hours you should not:  A) Drive an automobile B) Make any legal decisions C) Drink any alcoholic beverage   2) You may resume regular meals tomorrow.  Today it is better to start with liquids and gradually work up to solid foods.  You may eat anything you prefer, but it is better to start with liquids, then soup and crackers, and gradually work up to solid foods.   3) Please notify your doctor immediately if you have any unusual bleeding, trouble breathing, redness and pain at the surgery site, drainage, fever, or pain not relieved by medication.    4) Additional Instructions:        Please contact your physician with any problems or Same Day Surgery at [phone], Monday through Friday 6 am to 4 pm, or [place] [facility] number at [phone].

## 2017-06-26 NOTE — Discharge Summary (Signed)
[facility] NAME: Melissa Fitzgerald    MR#:  [id number]  DATE OF BIRTH:  1945/12/23  DATE OF ADMISSION:  06/23/2017 ADMITTING PHYSICIAN: Abbie Sons, MD  DATE OF DISCHARGE: 06/26/17  PRIMARY CARE PHYSICIAN: Chrismon, Vickki Muff, PA    ADMISSION DIAGNOSIS:  right ureteral calculus, right nephrolithiasis  DISCHARGE DIAGNOSIS:  Active Problems:   Hypoxia   SECONDARY DIAGNOSIS:   Past Medical History:  Diagnosis Date  . Anemia   . Colon polyp   . Depression   . Hyperlipidemia   . Hypertension   . Hypothyroidism   . Irritable bowel disease   . Kidney stone   . Thyroid disease     HOSPITAL COURSE:   HISTORY OF PRESENT ILLNESS: Melissa Fitzgerald  is a 72 y.o. female with a known history of anemia, hyperlipidemia, hypertension, hypothyroidism, thyroid disease, nephrolithiasis was referred by anesthesiologist Dr. Marcello Moores from PACU for low oxygen saturation.  Patient came from home for urology procedure as outpatient.  Had cystoscopy and stent placement.  She was intubated for the procedure and later on extubated she was found to be hypoxic with low oxygen saturation.  IV Lasix 20 mg was given in the PACU along with her duo nebulizations 2 episodes.  Patient oxygen saturation was still below 90% on room air she was put on oxygen via nasal cannula at 2 L.  Chest x-ray was done which showed evolving pneumonia.  Patient has cough.  No fever and chills.  Is awake and alert and responds to all verbal commands.  No complaints of any chest pain.  Hospitalist service was consulted for further care.    72 year old female patient with history of hyperlipidemia, hypertension, hypothyroidism, nephrolithiasis was referred from PACU for low oxygen saturation.Patient had a cystoscopy and stent placement today by urology.  1.Hypoxia secondary to aspiration pneumonia resolved Oxygen via nasal cannula at 2 L weaned off to RA  2.Aspiration  pneumonia  patient on IV clindamycin antibiotic, d/c with po clinda CEPACHOL prn  3.Hypothyroidism Resume oral levothyroxine  4.Hypertension oral metoprolol for control of blood pressure\  5.  Status post right ureteroscopic stone removal secondary to stone impaction Leave the stent for 6 weeks and outpatient urology follow-up.  5.DVT prophylaxis with subcu Lovenox daily    DISCHARGE CONDITIONS:   stable  CONSULTS OBTAINED:     PROCEDURES Status post right ureteroscopic stone removal secondary to stone impaction    DRUG ALLERGIES:   Allergies  Allergen Reactions  . Codeine Nausea And Vomiting  . Oxycodone Nausea And Vomiting  . Penicillins Nausea And Vomiting    Has patient had a PCN reaction causing immediate rash, facial/tongue/throat swelling, SOB or lightheadedness with hypotension: No Has patient had a PCN reaction causing severe rash involving mucus membranes or skin necrosis: No Has patient had a PCN reaction that required hospitalization: No Has patient had a PCN reaction occurring within the last 10 years: No If all of the above answers are "NO", then may proceed with Cephalosporin use.   . Tramadol Nausea And Vomiting  . Trintellix [Vortioxetine] Itching    DISCHARGE MEDICATIONS:   Allergies as of 06/26/2017      Reactions   Codeine Nausea And Vomiting   Oxycodone Nausea And Vomiting   Penicillins Nausea And Vomiting   Has patient had a PCN reaction causing immediate rash, facial/tongue/throat swelling, SOB or lightheadedness with hypotension: No Has patient had a PCN reaction causing severe rash involving  mucus membranes or skin necrosis: No Has patient had a PCN reaction that required hospitalization: No Has patient had a PCN reaction occurring within the last 10 years: No If all of the above answers are "NO", then may proceed with Cephalosporin use.   Tramadol Nausea And Vomiting   Trintellix [vortioxetine] Itching      Medication  List    TAKE these medications   acetaminophen 650 MG suppository Commonly known as:  TYLENOL Place 1 suppository (650 mg total) rectally every 6 (six) hours as needed for mild pain (or Fever >/= 101).   aspirin-acetaminophen-caffeine 250-250-65 MG tablet Commonly known as:  EXCEDRIN MIGRAINE Take 2 tablets by mouth 2 (two) times daily as needed for headache or migraine.   BIOFREEZE EX Apply 1 application topically daily as needed (pain).   carboxymethylcellulose 0.5 % Soln Commonly known as:  REFRESH PLUS Place 1 drop into both eyes daily as needed (dry eyes).   clindamycin 300 MG capsule Commonly known as:  CLEOCIN Take 1 capsule (300 mg total) by mouth 3 (three) times daily for 7 days.   dextromethorphan 30 MG/5ML liquid Commonly known as:  DELSYM Take 2.5 mLs (15 mg total) by mouth every 6 (six) hours as needed for cough.   diphenoxylate-atropine 2.5-0.025 MG tablet Commonly known as:  LOMOTIL TAKE ONE TABLET BY MOUTH THREE TIMES DAILY AS NEEDED FOR DIARRHEA OR LOOSE STOOLS   Ferrous Sulfate 28 MG Tabs Take 28 mg by mouth daily.   Garlic 3710 MG Caps Take 1,000 mg by mouth daily.   HYDROcodone-acetaminophen 5-325 MG tablet Commonly known as:  NORCO Take 1 tablet by mouth every 6 (six) hours as needed for moderate pain.   levothyroxine 100 MCG tablet Commonly known as:  SYNTHROID, LEVOTHROID TAKE ONE TABLET BY MOUTH ONCE DAILY BEFORE BREAKFAST   meclizine 25 MG tablet Commonly known as:  ANTIVERT Take 1 tablet (25 mg total) by mouth 4 (four) times daily as needed for dizziness. Nausea or anxiety.   Melatonin 5 MG Tabs Take 5 mg by mouth at bedtime as needed (sleep).   metoprolol succinate 100 MG 24 hr tablet Commonly known as:  TOPROL-XL Take 1 tablet (100 mg total) by mouth daily. Take with or immediately following a meal.   multivitamin with minerals Tabs tablet Take 1 tablet by mouth daily.   ondansetron 4 MG tablet Commonly known as:  ZOFRAN TAKE ONE  TABLET BY MOUTH EVERY 6 HOURS AS NEEDED FOR CHEMOTHERAPY INDUCED NAUSEA AND VOMINTING   oxybutynin 5 MG tablet Commonly known as:  DITROPAN 1 tab tid prn frequency,urgency, bladder spasm   oxyCODONE 5 MG immediate release tablet Commonly known as:  Oxy IR/ROXICODONE Take 1 tablet (5 mg total) by mouth every 6 (six) hours as needed for moderate pain, severe pain or breakthrough pain.   phenol-menthol 14.5 MG lozenge Place 1 lozenge inside cheek as needed for sore throat.   Potassium 99 MG Tabs Take 99 mg by mouth daily.   tamsulosin 0.4 MG Caps capsule Commonly known as:  FLOMAX Take 1 capsule (0.4 mg total) by mouth daily.        DISCHARGE INSTRUCTIONS:   With primary care physician in a week Follow-up with urology stoioff may 1  DIET:  Low salt   DISCHARGE CONDITION:  Stable  ACTIVITY:  Activity as tolerated  OXYGEN:  Home Oxygen: No.   Oxygen Delivery: room air  DISCHARGE LOCATION:  home   If you experience worsening of your admission symptoms, develop  shortness of breath, life threatening emergency, suicidal or homicidal thoughts you must seek medical attention immediately by calling 911 or calling your MD immediately  if symptoms less severe.  You Must read complete instructions/literature along with all the possible adverse reactions/side effects for all the Medicines you take and that have been prescribed to you. Take any new Medicines after you have completely understood and accpet all the possible adverse reactions/side effects.   Please note  You were cared for by a hospitalist during your hospital stay. If you have any questions about your discharge medications or the care you received while you were in the hospital after you are discharged, you can call the unit and asked to speak with the hospitalist on call if the hospitalist that took care of you is not available. Once you are discharged, your primary care physician will handle any further medical  issues. Please note that NO REFILLS for any discharge medications will be authorized once you are discharged, as it is imperative that you return to your primary care physician (or establish a relationship with a primary care physician if you do not have one) for your aftercare needs so that they can reassess your need for medications and monitor your lab values.     Today  No chief complaint on file.    ROS:  CONSTITUTIONAL: Denies fevers, chills. Denies any fatigue, weakness.  EYES: Denies blurry vision, double vision, eye pain. EARS, NOSE, THROAT: Denies tinnitus, ear pain, hearing loss. RESPIRATORY: Denies cough, wheeze, shortness of breath.  CARDIOVASCULAR: Denies chest pain, palpitations, edema.  GASTROINTESTINAL: Denies nausea, vomiting, diarrhea, abdominal pain. Denies bright red blood per rectum. GENITOURINARY: Denies dysuria, hematuria. ENDOCRINE: Denies nocturia or thyroid problems. HEMATOLOGIC AND LYMPHATIC: Denies easy bruising or bleeding. SKIN: Denies rash or lesion. MUSCULOSKELETAL: Denies pain in neck, back, shoulder, knees, hips or arthritic symptoms.  NEUROLOGIC: Denies paralysis, paresthesias.  PSYCHIATRIC: Denies anxiety or depressive symptoms.   VITAL SIGNS:  Blood pressure (!) 151/81, pulse 67, temperature 98 F (36.7 C), temperature source Oral, resp. rate 19, height 5\' 5"  (1.651 m), weight 78 kg (172 lb), SpO2 97 %.  I/O:    Intake/Output Summary (Last 24 hours) at 06/26/2017 1320 Last data filed at 06/26/2017 1052 Gross per 24 hour  Intake 765 ml  Output 1000 ml  Net -235 ml    PHYSICAL EXAMINATION:  GENERAL:  72 y.o.-year-old patient lying in the bed with no acute distress.  EYES: Pupils equal, round, reactive to light and accommodation. No scleral icterus. Extraocular muscles intact.  HEENT: Head atraumatic, normocephalic. Oropharynx and nasopharynx clear.  NECK:  Supple, no jugular venous distention. No thyroid enlargement, no tenderness.  LUNGS:  Normal breath sounds bilaterally, no wheezing, rales,rhonchi or crepitation. No use of accessory muscles of respiration.  CARDIOVASCULAR: S1, S2 normal. No murmurs, rubs, or gallops.  ABDOMEN: Soft, non-tender, non-distended. Bowel sounds present. No organomegaly or mass.  EXTREMITIES: No pedal edema, cyanosis, or clubbing.  NEUROLOGIC: Cranial nerves II through XII are intact. Muscle strength 5/5 in all extremities. Sensation intact. Gait not checked.  PSYCHIATRIC: The patient is alert and oriented x 3.  SKIN: No obvious rash, lesion, or ulcer.   DATA REVIEW:   CBC Recent Labs  Lab 06/25/17 0636  WBC 6.7  HGB 9.4*  HCT 28.7*  PLT 197    Chemistries  Recent Labs  Lab 06/23/17 1400  06/25/17 0636  NA 141   < > 141  K 3.7   < > 3.7  CL 102   < > 103  CO2 29   < > 31  GLUCOSE 155*   < > 102*  BUN 14   < > 32*  CREATININE 1.15*   < > 1.17*  CALCIUM 8.8*   < > 8.8*  AST 37  --   --   ALT 16  --   --   ALKPHOS 55  --   --   BILITOT 0.5  --   --    < > = values in this interval not displayed.    Cardiac Enzymes No results for input(s): TROPONINI in the last 168 hours.  Microbiology Results  Results for orders placed or performed in visit on 06/18/17  Microscopic Examination     Status: Abnormal   Collection Time: 06/18/17 11:40 AM  Result Value Ref Range Status   WBC, UA 0-5 0 - 5 /hpf Final   RBC, UA 3-10 (A) 0 - 2 /hpf Final   Epithelial Cells (non renal) >10 (A) 0 - 10 /hpf Final   Renal Epithel, UA 0-10 None seen /hpf Final   Bacteria, UA Few (A) None seen/Few Final  CULTURE, URINE COMPREHENSIVE     Status: None   Collection Time: 06/18/17  1:22 PM  Result Value Ref Range Status   Urine Culture, Comprehensive Final report  Final   Organism ID, Bacteria Lactobacillus species  Final    Comment: Greater than 100,000 colony forming units per mL Susceptibility not normally performed on this organism.    Organism ID, Bacteria Comment  Final    Comment: Mixed  urogenital flora 4,000 Colonies/mL     RADIOLOGY:  X-ray Chest Pa Or Ap  Result Date: 06/23/2017 CLINICAL DATA:  Chest crackles EXAM: CHEST  1 VIEW COMPARISON:  Chest x-ray of 08/15/2016 FINDINGS: There are prominent markings in the mid right lung and right lung base which may represent developing pneumonia. The left lung appears clear. No pleural effusion is seen. Mediastinal and hilar contours are unremarkable and cardiomegaly is stable. No acute bony abnormality is seen. IMPRESSION: Prominent markings in the right mid lung and right lung base suspicious for developing pneumonia. Recommend follow-up with two-view chest x-ray. Electronically Signed   By: Ivar Drape M.D.   On: 06/23/2017 12:18    EKG:   Orders placed or performed during the hospital encounter of 06/22/17  . EKG 12-Lead  . EKG 12-Lead      Management plans discussed with the patient, family and they are in agreement.  CODE STATUS:     Code Status Orders  (From admission, onward)        Start     Ordered   06/23/17 1417  Full code  Continuous     06/23/17 1416    Code Status History    This patient has a current code status but no historical code status.      TOTAL TIME TAKING CARE OF THIS PATIENT: 45  minutes.   Note: This dictation was prepared with Dragon dictation along with smaller phrase technology. Any transcriptional errors that result from this process are unintentional.   @[facility] @  on 06/26/2017 at 1:20 PM  Between 7am to 6pm - Pager - [phone]  After 6pm go to [email or web address] - password [email or web address] [facility]  Office  [phone]  CC: Primary care physician; [name], PA

## 2017-07-03 ENCOUNTER — Encounter: Payer: Self-pay | Admitting: Family Medicine

## 2017-07-03 ENCOUNTER — Ambulatory Visit: Payer: Self-pay

## 2017-07-14 ENCOUNTER — Telehealth: Payer: Self-pay

## 2017-07-14 NOTE — Telephone Encounter (Signed)
Tried to contact pt to r/s missed AWV and there was NANM. Will try again later.  -MM

## 2017-07-17 NOTE — Telephone Encounter (Signed)
Scheduled pt for 07/21/17 @ 1:40 PM. -MM

## 2017-07-21 ENCOUNTER — Ambulatory Visit (INDEPENDENT_AMBULATORY_CARE_PROVIDER_SITE_OTHER): Payer: Medicare PPO

## 2017-07-21 VITALS — BP 158/64 | HR 80 | Temp 98.7°F | Ht 66.0 in | Wt 175.2 lb

## 2017-07-21 DIAGNOSIS — Z0000 Encounter for general adult medical examination without abnormal findings: Secondary | ICD-10-CM

## 2017-07-21 DIAGNOSIS — Z23 Encounter for immunization: Secondary | ICD-10-CM | POA: Diagnosis not present

## 2017-07-21 NOTE — Progress Notes (Addendum)
Subjective:   Melissa Fitzgerald is a 72 y.o. female who presents for Medicare Annual (Subsequent) preventive examination.  Review of Systems:  N/A  Cardiac Risk Factors include: advanced age (>29mn, >>48women);dyslipidemia;hypertension;smoking/ tobacco exposure     Objective:     Vitals: BP (!) 158/92 (BP Location: Left Arm)   Pulse 80   Temp 98.7 F (37.1 C) (Oral)   Ht _0  (1.676 m)   Wt 175 lb 3.2 oz (79.5 kg)   BMI 28.28 kg/m   Body mass index is 28.28 kg/m.  Advanced Directives 07/21/2017 06/23/2017 06/22/2017 06/16/2017 08/15/2016 06/18/2016 04/28/2016  Does Patient Have a Medical Advance Directive? Yes No No No No Yes Yes  Type of Advance Directive Living will - - - - - -  Does patient want to make changes to medical advance directive? - - - - - Yes (ED - Information included in AVS) -  Would patient like information on creating a medical advance directive? - No - Patient declined Yes (MAU/Ambulatory/Procedural Areas - Information given) No - Patient declined No - Patient declined - -    Tobacco Social History   Tobacco Use  Smoking Status Former Smoker  . Packs/day: 0.00  . Types: Cigarettes  Smokeless Tobacco Never Used  Tobacco Comment   still smoking every now and then, rarely one     Counseling given: Not Answered Comment: still smoking every now and then, rarely one   Clinical Intake:  Pre-visit preparation completed: Yes  Pain : No/denies pain Pain Score: 0-No pain     Nutritional Status: BMI 25 -29 Overweight Nutritional Risks: None Diabetes: No  How often do you need to have someone help you when you read instructions, pamphlets, or other written materials from your doctor or pharmacy?: 1 - Never  Interpreter Needed?: No  Information entered by :: [facility] LPN  Past Medical History:  Diagnosis Date  . Anemia   . Colon polyp   . Depression   . Hyperlipidemia   . Hypertension   . Hypothyroidism   . Irritable bowel disease   . Kidney  stone   . Thyroid disease    Past Surgical History:  Procedure Laterality Date  . ABDOMINAL HYSTERECTOMY    . BREAST ENHANCEMENT SURGERY    . BREAST SURGERY    . CHOLECYSTECTOMY  2000  . COLECTOMY  09/2013  . COLONOSCOPY  09-13-13   Dr SDonnella Sham . COLONOSCOPY WITH PROPOFOL N/A 10/18/2014   Procedure: COLONOSCOPY WITH PROPOFOL;  Surgeon: SChristene Lye MD;  Location: [facility] END[facility]COPY;  Service: Endoscopy;  Laterality: N/A;  . CYSTOSCOPY/URETEROSCOPY/HOLMIUM LASER/STENT PLACEMENT Right 06/23/2017   Procedure: CYSTOSCOPY/URETEROSCOPY/HOLMIUM LASER/STENT PLACEMENT;  Surgeon: SAbbie Sons MD;  Location: [facility] ORS;  Service: Urology;  Laterality: Right;  . ESOPHAGOGASTRODUODENOSCOPY (EGD) WITH PROPOFOL N/A 01/03/2015   Procedure: ESOPHAGOGASTRODUODENOSCOPY (EGD) WITH PROPOFOL;  Surgeon: SChristene Lye MD;  Location: [facility] ENDOSCOPY;  Service: Endoscopy;  Laterality: N/A;  . EYE SURGERY     bilateral cataract   . FOOT SURGERY    . KIDNEY STONE SURGERY    . NASAL SINUS SURGERY    . PORT-A-CATH REMOVAL    . PORTACATH PLACEMENT  10/24/13  . THYROID SURGERY    . TONSILLECTOMY     Family History  Problem Relation Age of Onset  . Heart disease Mother   . Diabetes Mother   . Arthritis Sister   . Hyperlipidemia Sister   . COPD Brother   . Diabetes Sister   .  Colon cancer Unknown        first cousin  . Breast cancer Maternal Aunt   . Breast cancer Paternal Aunt    Social History   Socioeconomic History  . Marital status: Widowed    Spouse name: Not on file  . Number of children: 1  . Years of education: Not on file  . Highest education level: 12th grade  Occupational History  . Occupation: retired  Scientific laboratory technician  . Financial resource strain: Not hard at all  . Food insecurity:    Worry: Never true    Inability: Never true  . Transportation needs:    Medical: No    Non-medical: No  Tobacco Use  . Smoking status: Former Smoker    Packs/day: 0.00    Types:  Cigarettes  . Smokeless tobacco: Never Used  . Tobacco comment: still smoking every now and then, rarely one  Substance and Sexual Activity  . Alcohol use: Yes    Alcohol/week: 0.0 oz    Comment: occasionally  . Drug use: No  . Sexual activity: Not on file  Lifestyle  . Physical activity:    Days per week: Not on file    Minutes per session: Not on file  . Stress: Rather much  Relationships  . Social connections:    Talks on phone: Not on file    Gets together: Not on file    Attends religious service: Not on file    Active member of club or organization: Not on file    Attends meetings of clubs or organizations: Not on file    Relationship status: Not on file  Other Topics Concern  . Not on file  Social History Narrative  . Not on file    Outpatient Encounter Medications as of 07/21/2017  Medication Sig  . aspirin-acetaminophen-caffeine (EXCEDRIN MIGRAINE) 250-250-65 MG tablet Take 2 tablets by mouth 2 (two) times daily as needed for headache or migraine.  . carboxymethylcellulose (REFRESH PLUS) 0.5 % SOLN Place 1 drop into both eyes daily as needed (dry eyes).   Marland Kitchen levothyroxine (SYNTHROID, LEVOTHROID) 100 MCG tablet TAKE ONE TABLET BY MOUTH ONCE DAILY BEFORE BREAKFAST  . meclizine (ANTIVERT) 25 MG tablet Take 1 tablet (25 mg total) by mouth 4 (four) times daily as needed for dizziness. Nausea or anxiety.  . Melatonin 5 MG TABS Take 5 mg by mouth at bedtime as needed (sleep).  . Menthol, Topical Analgesic, (BIOFREEZE EX) Apply 1 application topically daily as needed (pain).  . metoprolol succinate (TOPROL-XL) 100 MG 24 hr tablet Take 1 tablet (100 mg total) by mouth daily. Take with or immediately following a meal.  . acetaminophen (TYLENOL) 650 MG suppository Place 1 suppository (650 mg total) rectally every 6 (six) hours as needed for mild pain (or Fever >/= 101). (Patient not taking: Reported on 07/21/2017)  . dextromethorphan (DELSYM) 30 MG/5ML liquid Take 2.5 mLs (15 mg  total) by mouth every 6 (six) hours as needed for cough. (Patient not taking: Reported on 07/21/2017)  . diphenoxylate-atropine (LOMOTIL) 2.5-0.025 MG tablet TAKE ONE TABLET BY MOUTH THREE TIMES DAILY AS NEEDED FOR DIARRHEA OR LOOSE STOOLS (Patient not taking: Reported on 07/21/2017)  . Ferrous Sulfate 28 MG TABS Take 28 mg by mouth daily.  . Garlic 2458 MG CAPS Take 1,000 mg by mouth daily.  Marland Kitchen HYDROcodone-acetaminophen (NORCO) 5-325 MG tablet Take 1 tablet by mouth every 6 (six) hours as needed for moderate pain. (Patient not taking: Reported on 07/21/2017)  . Multiple  Vitamin (MULTIVITAMIN WITH MINERALS) TABS tablet Take 1 tablet by mouth daily.  . ondansetron (ZOFRAN) 4 MG tablet TAKE ONE TABLET BY MOUTH EVERY 6 HOURS AS NEEDED FOR CHEMOTHERAPY INDUCED NAUSEA AND VOMINTING (Patient not taking: Reported on 07/21/2017)  . oxybutynin (DITROPAN) 5 MG tablet 1 tab tid prn frequency,urgency, bladder spasm (Patient not taking: Reported on 07/21/2017)  . oxyCODONE (OXY IR/ROXICODONE) 5 MG immediate release tablet Take 1 tablet (5 mg total) by mouth every 6 (six) hours as needed for moderate pain, severe pain or breakthrough pain. (Patient not taking: Reported on 07/21/2017)  . phenol-menthol (CEPASTAT) 14.5 MG lozenge Place 1 lozenge inside cheek as needed for sore throat. (Patient not taking: Reported on 07/21/2017)  . Potassium 99 MG TABS Take 99 mg by mouth daily.  . tamsulosin (FLOMAX) 0.4 MG CAPS capsule Take 1 capsule (0.4 mg total) by mouth daily. (Patient not taking: Reported on 06/22/2017)   No facility-administered encounter medications on file as of 07/21/2017.     Activities of Daily Living In your present state of health, do you have any difficulty performing the following activities: 07/21/2017 06/23/2017  Hearing? N N  Vision? N N  Difficulty concentrating or making decisions? Y N  Walking or climbing stairs? N N  Dressing or bathing? N N  Doing errands, shopping? N N  Preparing Food and eating  ? N -  Using the Toilet? N -  In the past six months, have you accidently leaked urine? Y -  Comment Due to stent.  -  Do you have problems with loss of bowel control? N -  Managing your Medications? N -  Managing your Finances? N -  Housekeeping or managing your Housekeeping? N -  Some recent data might be hidden    Patient Care Team: Chrismon, Vickki Muff, PA as PCP - General (Physician Assistant) Lequita Asal, MD as Referring Physician (Hematology and Oncology) Odette Fraction as Consulting Physician (Optometry) Bernardo Heater, Ronda Fairly, MD as Consulting Physician (Urology) Vladimir Crofts, MD as Consulting Physician (Neurology)    Assessment:   This is a routine wellness examination for [place].  Exercise Activities and Dietary recommendations Current Exercise Habits: The patient does not participate in regular exercise at present, Exercise limited by: None identified  Goals    . Exercise 3x per week (30 min per time)     Recommend to start walking 3 days a week for at least 30 minutes at a time.        Fall Risk Fall Risk  07/21/2017 12/08/2016 06/18/2016 03/14/2016 01/04/2015  Falls in the past year? No No Yes No Yes  Number falls in past yr: - - 1 - 1  Injury with Fall? - - No - No  Comment - - - - minor bruising on leg. It has gone away. fall occurred 2-3 weeks ago  Risk for fall due to : - - - - Impaired balance/gait;Medication side effect  Risk for fall due to: Comment - - - - this started with the chemotherapy. The last treatment was in Jan 2016  Follow up - - Falls prevention discussed - -   Is the patient's home free of loose throw rugs in walkways, pet beds, electrical cords, etc?   yes      Grab bars in the bathroom? no      Handrails on the stairs?   no      Adequate lighting?   yes  Timed Get Up and Go performed: N/A  Depression Screen PHQ 2/9 Scores 07/21/2017 12/08/2016 06/18/2016 06/18/2016  PHQ - 2 Score _0 PHQ- 9 Score _1 Exception  Documentation - - - -     Cognitive Function     6CIT Screen 06/18/2016  What Year? 0 points  What month? 3 points  What time? 0 points  Count back from 20 0 points  Months in reverse 0 points  Repeat phrase 8 points  Total Score 11    Immunization History  Administered Date(s) Administered  . MMR 12/24/1994  . Pneumococcal Conjugate-13 06/18/2016  . Pneumococcal Polysaccharide-23 07/21/2017  . Td 12/17/1994, 09/03/2004  . Tdap 05/15/2014  . Zoster 09/30/2011    Qualifies for Shingles Vaccine? Due for Shingles vaccine. Declined my offer to administer today. Education has been provided regarding the importance of this vaccine. Pt has been advised to call her insurance company to determine her out of pocket expense. Advised she may also receive this vaccine at her local pharmacy or Health Dept. Verbalized acceptance and understanding.  Screening Tests Health Maintenance  Topic Date Due  . MAMMOGRAM  01/22/1996  . DEXA SCAN  03/24/2026 (Originally 01/22/2011)  . INFLUENZA VACCINE  10/22/2017  . TETANUS/TDAP  05/15/2024  . COLONOSCOPY  10/17/2024  . Hepatitis C Screening  Completed  . PNA vac Low Risk Adult  Completed    Cancer Screenings: Lung: Low Dose CT Chest recommended if Age 85-80 years, 30 pack-year currently smoking OR have quit w/in 15years. Patient does qualify. Breast:  Up to date on Mammogram? No, pt declined setting up a screening at this time. Up to date of Bone Density/Dexa? No, pt declined referral today.  Colorectal: Up to date  Additional Screenings:  Hepatitis C Screening: Up to date     Plan:  I have personally reviewed and addressed the Medicare Annual Wellness questionnaire and have noted the following in the patient's chart:  A. Medical and social history B. Use of alcohol, tobacco or illicit drugs  C. Current medications and supplements D. Functional ability and status E.  Nutritional status F.  Physical activity G. Advance  directives H. List of other physicians I.  Hospitalizations, surgeries, and ER visits in previous 12 months J.  [place] such as hearing and vision if needed, cognitive and depression L. Referrals and appointments - none  In addition, I have reviewed and discussed with patient certain preventive protocols, quality metrics, and best practice recommendations. A written personalized care plan for preventive services as well as general preventive health recommendations were provided to patient.  See attached scanned questionnaire for additional information.   Signed,  Fabio Neighbors, LPN Nurse Health Advisor   Nurse Recommendations: Pt declined the DEXA referral and setting up a mammogram at this time. Completed the pneumonia vaccine series today.   Reviewed the documentation and recommendations of the Nurse Health Advisor. Was available for consultation and agree with plan.

## 2017-07-21 NOTE — Patient Instructions (Signed)
Melissa Fitzgerald , Thank you for taking time to come for your Medicare Wellness Visit. I appreciate your ongoing commitment to your health goals. Please review the following plan we discussed and let me know if I can assist you in the future.   Screening recommendations/referrals: Colonoscopy: Up to date Mammogram: Pt declines today.  Bone Density: Pt declines today.  Recommended yearly ophthalmology/optometry visit for glaucoma screening and checkup Recommended yearly dental visit for hygiene and checkup  Vaccinations: Influenza vaccine: N/A Pneumococcal vaccine: Up to date Tdap vaccine: Up to date Shingles vaccine: Pt declines today.     Advanced directives: Please bring a copy of your POA (Power of Attorney) and/or Living Will to your next appointment.   Conditions/risks identified: Recommend to start walking 3 days a week for at least 30 minutes at a time.   Next appointment: 07/28/17 @ 2:00 PM with Vernie Murders.   Preventive Care 35 Years and Older, Female Preventive care refers to lifestyle choices and visits with your health care provider that can promote health and wellness. What does preventive care include?  A yearly physical exam. This is also called an annual well check.  Dental exams once or twice a year.  Routine eye exams. Ask your health care provider how often you should have your eyes checked.  Personal lifestyle choices, including:  Daily care of your teeth and gums.  Regular physical activity.  Eating a healthy diet.  Avoiding tobacco and drug use.  Limiting alcohol use.  Practicing safe sex.  Taking low-dose aspirin every day.  Taking vitamin and mineral supplements as recommended by your health care provider. What happens during an annual well check? The services and screenings done by your health care provider during your annual well check will depend on your age, overall health, lifestyle risk factors, and family history of disease. Counseling   Your health care provider may ask you questions about your:  Alcohol use.  Tobacco use.  Drug use.  Emotional well-being.  Home and relationship well-being.  Sexual activity.  Eating habits.  History of falls.  Memory and ability to understand (cognition).  Work and work Statistician.  Reproductive health. Screening  You may have the following tests or measurements:  Height, weight, and BMI.  Blood pressure.  Lipid and cholesterol levels. These may be checked every 5 years, or more frequently if you are over 3 years old.  Skin check.  Lung cancer screening. You may have this screening every year starting at age 110 if you have a 30-pack-year history of smoking and currently smoke or have quit within the past 15 years.  Fecal occult blood test (FOBT) of the stool. You may have this test every year starting at age 67.  Flexible sigmoidoscopy or colonoscopy. You may have a sigmoidoscopy every 5 years or a colonoscopy every 10 years starting at age 58.  Hepatitis C blood test.  Hepatitis B blood test.  Sexually transmitted disease (STD) testing.  Diabetes screening. This is done by checking your blood sugar (glucose) after you have not eaten for a while (fasting). You may have this done every 1-3 years.  Bone density scan. This is done to screen for osteoporosis. You may have this done starting at age 44.  Mammogram. This may be done every 1-2 years. Talk to your health care provider about how often you should have regular mammograms. Talk with your health care provider about your test results, treatment options, and if necessary, the need for more tests. Vaccines  Your health care provider may recommend certain vaccines, such as:  Influenza vaccine. This is recommended every year.  Tetanus, diphtheria, and acellular pertussis (Tdap, Td) vaccine. You may need a Td booster every 10 years.  Zoster vaccine. You may need this after age 17.  Pneumococcal 13-valent  conjugate (PCV13) vaccine. One dose is recommended after age 14.  Pneumococcal polysaccharide (PPSV23) vaccine. One dose is recommended after age 55. Talk to your health care provider about which screenings and vaccines you need and how often you need them. This information is not intended to replace advice given to you by your health care provider. Make sure you discuss any questions you have with your health care provider. Document Released: 04/06/2015 Document Revised: 11/28/2015 Document Reviewed: 01/09/2015 Elsevier Interactive Patient Education  2017 [place] Prevention in the Home Falls can cause injuries. They can happen to people of all ages. There are many things you can do to make your home safe and to help prevent falls. What can I do on the outside of my home?  Regularly fix the edges of walkways and driveways and fix any cracks.  Remove anything that might make you trip as you walk through a door, such as a raised step or threshold.  Trim any bushes or trees on the path to your home.  Use bright outdoor lighting.  Clear any walking paths of anything that might make someone trip, such as rocks or tools.  Regularly check to see if handrails are loose or broken. Make sure that both sides of any steps have handrails.  Any raised decks and porches should have guardrails on the edges.  Have any leaves, snow, or ice cleared regularly.  Use sand or salt on walking paths during winter.  Clean up any spills in your garage right away. This includes oil or grease spills. What can I do in the bathroom?  Use night lights.  Install grab bars by the toilet and in the tub and shower. Do not use towel bars as grab bars.  Use non-skid mats or decals in the tub or shower.  If you need to sit down in the shower, use a plastic, non-slip stool.  Keep the floor dry. Clean up any water that spills on the floor as soon as it happens.  Remove soap buildup in the tub or  shower regularly.  Attach bath mats securely with double-sided non-slip rug tape.  Do not have throw rugs and other things on the floor that can make you trip. What can I do in the bedroom?  Use night lights.  Make sure that you have a light by your bed that is easy to reach.  Do not use any sheets or blankets that are too big for your bed. They should not hang down onto the floor.  Have a firm chair that has side arms. You can use this for support while you get dressed.  Do not have throw rugs and other things on the floor that can make you trip. What can I do in the kitchen?  Clean up any spills right away.  Avoid walking on wet floors.  Keep items that you use a lot in easy-to-reach places.  If you need to reach something above you, use a strong step stool that has a grab bar.  Keep electrical cords out of the way.  Do not use floor polish or wax that makes floors slippery. If you must use wax, use non-skid floor wax.  Do  not have throw rugs and other things on the floor that can make you trip. What can I do with my stairs?  Do not leave any items on the stairs.  Make sure that there are handrails on both sides of the stairs and use them. Fix handrails that are broken or loose. Make sure that handrails are as long as the stairways.  Check any carpeting to make sure that it is firmly attached to the stairs. Fix any carpet that is loose or worn.  Avoid having throw rugs at the top or bottom of the stairs. If you do have throw rugs, attach them to the floor with carpet tape.  Make sure that you have a light switch at the top of the stairs and the bottom of the stairs. If you do not have them, ask someone to add them for you. What else can I do to help prevent falls?  Wear shoes that:  Do not have high heels.  Have rubber bottoms.  Are comfortable and fit you well.  Are closed at the toe. Do not wear sandals.  If you use a stepladder:  Make sure that it is fully  opened. Do not climb a closed stepladder.  Make sure that both sides of the stepladder are locked into place.  Ask someone to hold it for you, if possible.  Clearly mark and make sure that you can see:  Any grab bars or handrails.  First and last steps.  Where the edge of each step is.  Use tools that help you move around (mobility aids) if they are needed. These include:  Canes.  Walkers.  Scooters.  Crutches.  Turn on the lights when you go into a dark area. Replace any light bulbs as soon as they burn out.  Set up your furniture so you have a clear path. Avoid moving your furniture around.  If any of your floors are uneven, fix them.  If there are any pets around you, be aware of where they are.  Review your medicines with your doctor. Some medicines can make you feel dizzy. This can increase your chance of falling. Ask your doctor what other things that you can do to help prevent falls. This information is not intended to replace advice given to you by your health care provider. Make sure you discuss any questions you have with your health care provider. Document Released: 01/04/2009 Document Revised: 08/16/2015 Document Reviewed: 04/14/2014 Elsevier Interactive Patient Education  2017 [employer].

## 2017-07-22 ENCOUNTER — Encounter: Payer: Self-pay | Admitting: Urology

## 2017-07-22 ENCOUNTER — Ambulatory Visit (INDEPENDENT_AMBULATORY_CARE_PROVIDER_SITE_OTHER): Payer: Medicare PPO | Admitting: Urology

## 2017-07-22 VITALS — BP 156/95 | HR 76 | Resp 16 | Ht 66.0 in | Wt 173.6 lb

## 2017-07-22 DIAGNOSIS — Z09 Encounter for follow-up examination after completed treatment for conditions other than malignant neoplasm: Secondary | ICD-10-CM | POA: Diagnosis not present

## 2017-07-22 NOTE — Progress Notes (Signed)
07/22/2017 1:21 PM   Melissa Fitzgerald January 12, 1946 [id number]  Referring provider: [name], [address] [place], [place] [address]  Chief Complaint  Patient presents with  . Follow-up    HPI: 72 year old female presents for postop follow-up.  She underwent ureteroscopic removal of a 10 mm right mid ureteral calculus on 06/23/2017.  The procedure was complicated by a ureteral perforation and her indwelling stent remains.  She has no complaints and is tolerating her stent well.  I do not see a report for her stone analysis.   PMH: Past Medical History:  Diagnosis Date  . Anemia   . Colon polyp   . Depression   . Hyperlipidemia   . Hypertension   . Hypothyroidism   . Irritable bowel disease   . Kidney stone   . Thyroid disease     Surgical History: Past Surgical History:  Procedure Laterality Date  . ABDOMINAL HYSTERECTOMY    . BREAST ENHANCEMENT SURGERY    . BREAST SURGERY    . CHOLECYSTECTOMY  2000  . COLECTOMY  09/2013  . COLONOSCOPY  09-13-13   Dr [name]  . COLONOSCOPY WITH PROPOFOL N/A 10/18/2014   Procedure: COLONOSCOPY WITH PROPOFOL;  Surgeon: [name], MD;  Location: [facility];  Service: Endoscopy;  Laterality: N/A;  . CYSTOSCOPY/URETEROSCOPY/HOLMIUM LASER/STENT PLACEMENT Right 06/23/2017   Procedure: CYSTOSCOPY/URETEROSCOPY/HOLMIUM LASER/STENT PLACEMENT;  Surgeon: Abbie Sons, MD;  Location: [facility] OR[facility];  Service: Urology;  Laterality: Right;  . ESOPHAGOGASTRODUODENOSCOPY (EGD) WITH PROPOFOL N/A 01/03/2015   Procedure: ESOPHAGOGASTRODUODENOSCOPY (EGD) WITH PROPOFOL;  Surgeon: [name], MD;  Location: [facility];  Service: Endoscopy;  Laterality: N/A;  . EYE SURGERY     bilateral cataract   . FOOT SURGERY    . KIDNEY STONE SURGERY    . NASAL SINUS SURGERY    . PORT-A-CATH REMOVAL    . PORTACATH PLACEMENT  10/24/13  . THYROID SURGERY    . TONSILLECTOMY      Home Medications:  Allergies as of 07/22/2017    Reactions   Codeine Nausea And Vomiting   Oxycodone Nausea And Vomiting   Penicillins Nausea And Vomiting   Has patient had a PCN reaction causing immediate rash, facial/tongue/throat swelling, SOB or lightheadedness with hypotension: No Has patient had a PCN reaction causing severe rash involving mucus membranes or skin necrosis: No Has patient had a PCN reaction that required hospitalization: No Has patient had a PCN reaction occurring within the last 10 years: No If all of the above answers are "NO", then may proceed with Cephalosporin use.   Tramadol Nausea And Vomiting   Trintellix [vortioxetine] Itching      Medication List        Accurate as of 07/22/17  1:21 PM. Always use your most recent med list.          aspirin-acetaminophen-caffeine 250-250-65 MG tablet Commonly known as:  EXCEDRIN MIGRAINE Take 2 tablets by mouth 2 (two) times daily as needed for headache or migraine.   BIOFREEZE EX Apply 1 application topically daily as needed (pain).   carboxymethylcellulose 0.5 % Soln Commonly known as:  REFRESH PLUS Place 1 drop into both eyes daily as needed (dry eyes).   Ferrous Sulfate 28 MG Tabs Take 28 mg by mouth daily.   Garlic 1914 MG Caps Take 1,000 mg by mouth daily.   levothyroxine 100 MCG tablet Commonly known as:  SYNTHROID, LEVOTHROID TAKE ONE TABLET BY MOUTH ONCE DAILY BEFORE BREAKFAST   meclizine 25  MG tablet Commonly known as:  ANTIVERT Take 1 tablet (25 mg total) by mouth 4 (four) times daily as needed for dizziness. Nausea or anxiety.   Melatonin 5 MG Tabs Take 5 mg by mouth at bedtime as needed (sleep).   metoprolol succinate 100 MG 24 hr tablet Commonly known as:  TOPROL-XL Take 1 tablet (100 mg total) by mouth daily. Take with or immediately following a meal.   multivitamin with minerals Tabs tablet Take 1 tablet by mouth daily.   Potassium 99 MG Tabs Take 99 mg by mouth daily.       Allergies:  Allergies  Allergen Reactions  .  Codeine Nausea And Vomiting  . Oxycodone Nausea And Vomiting  . Penicillins Nausea And Vomiting    Has patient had a PCN reaction causing immediate rash, facial/tongue/throat swelling, SOB or lightheadedness with hypotension: No Has patient had a PCN reaction causing severe rash involving mucus membranes or skin necrosis: No Has patient had a PCN reaction that required hospitalization: No Has patient had a PCN reaction occurring within the last 10 years: No If all of the above answers are "NO", then may proceed with Cephalosporin use.   . Tramadol Nausea And Vomiting  . Trintellix [Vortioxetine] Itching    Family History: Family History  Problem Relation Age of Onset  . Heart disease Mother   . Diabetes Mother   . Arthritis Sister   . Hyperlipidemia Sister   . COPD Brother   . Diabetes Sister   . Colon cancer Unknown        first cousin  . Breast cancer Maternal Aunt   . Breast cancer Paternal Aunt     Social History:  reports that she has quit smoking. Her smoking use included cigarettes. She smoked 0.00 packs per day. She has never used smokeless tobacco. She reports that she drinks alcohol. She reports that she does not use drugs.  ROS: UROLOGY Frequent Urination?: No Hard to postpone urination?: No Burning/pain with urination?: No Get up at night to urinate?: No Leakage of urine?: No Urine stream starts and stops?: No Trouble starting stream?: No Do you have to strain to urinate?: No Blood in urine?: No Urinary tract infection?: No Sexually transmitted disease?: No Injury to kidneys or bladder?: No Painful intercourse?: No Weak stream?: No Currently pregnant?: No Vaginal bleeding?: No Last menstrual period?: n  Gastrointestinal Nausea?: Yes Vomiting?: Yes Indigestion/heartburn?: Yes Diarrhea?: Yes Constipation?: No  Constitutional Fever: No Night sweats?: No Weight loss?: No Fatigue?: Yes  Skin Skin rash/lesions?: No Itching?: Yes  Eyes Blurred  vision?: No Double vision?: No  Ears/Nose/Throat Sore throat?: No Sinus problems?: No  Hematologic/Lymphatic Swollen glands?: No Easy bruising?: No  Cardiovascular Leg swelling?: Yes Chest pain?: No  Respiratory Cough?: No Shortness of breath?: Yes  Endocrine Excessive thirst?: No  Musculoskeletal Back pain?: Yes Joint pain?: No  Neurological Headaches?: Yes Dizziness?: Yes  Psychologic Depression?: Yes Anxiety?: Yes  Physical Exam: BP (!) 156/95   Pulse 76   Resp 16   Ht 5\' 6"  (1.676 m)   Wt 173 lb 9.6 oz (78.7 kg)   SpO2 98%   BMI 28.02 kg/m   Constitutional:  Alert and oriented, No acute distress.  Laboratory Data: Lab Results  Component Value Date   WBC 6.7 06/25/2017   HGB 9.4 (L) 06/25/2017   HCT 28.7 (L) 06/25/2017   MCV 91.4 06/25/2017   PLT 197 06/25/2017    Lab Results  Component Value Date   CREATININE  1.17 (H) 06/25/2017     Assessment & Plan:   Status post right ureteroscopic stone removal with ureteral perforation.  Will schedule cystoscopy with stent removal in approximately 2 weeks.   Abbie Sons, [place] [address], [place], [place] [address] [phone]

## 2017-07-28 ENCOUNTER — Encounter: Payer: Self-pay | Admitting: Family Medicine

## 2017-07-28 ENCOUNTER — Ambulatory Visit: Payer: Medicare PPO | Admitting: Family Medicine

## 2017-07-28 VITALS — BP 152/96 | HR 75 | Temp 98.6°F | Ht 66.0 in | Wt 173.6 lb

## 2017-07-28 DIAGNOSIS — I10 Essential (primary) hypertension: Secondary | ICD-10-CM

## 2017-07-28 DIAGNOSIS — D509 Iron deficiency anemia, unspecified: Secondary | ICD-10-CM | POA: Diagnosis not present

## 2017-07-28 DIAGNOSIS — C180 Malignant neoplasm of cecum: Secondary | ICD-10-CM | POA: Diagnosis not present

## 2017-07-28 DIAGNOSIS — E782 Mixed hyperlipidemia: Secondary | ICD-10-CM | POA: Diagnosis not present

## 2017-07-28 DIAGNOSIS — N200 Calculus of kidney: Secondary | ICD-10-CM

## 2017-07-28 DIAGNOSIS — E890 Postprocedural hypothyroidism: Secondary | ICD-10-CM

## 2017-07-28 MED ORDER — HYDROCHLOROTHIAZIDE 25 MG PO TABS: 25.0000 mg | ORAL_TABLET | Freq: Every day | ORAL | 3 refills | Status: DC

## 2017-07-28 NOTE — Progress Notes (Signed)
Patient: Melissa Fitzgerald, Female    DOB: 1945-07-11, 72 y.o.   MRN: [id number] Visit Date: 07/28/2017  Today's Provider: Vernie Murders, PA   Chief Complaint  Patient presents with  . Annual Exam   Subjective:     Complete Physical Melissa Fitzgerald is a 72 y.o. female. She feels fair. She reports blood pressure is elevated and she stopped taking Sertraline due to increased headaches. She states headaches have improved since stopping medication. She reports not exercising regularly. She reports she is sleeping fairly well.  ----------------------------------------------------------- Patient had AWE on 07/21/17. She declined Mammogram and BMD at that time.    Review of Systems  Constitutional: Negative.   HENT: Negative.   Eyes: Negative.   Respiratory: Negative.   Cardiovascular: Negative.   Gastrointestinal: Negative.   Endocrine: Negative.   Genitourinary: Negative.   Musculoskeletal: Negative.   Skin: Negative.   Allergic/Immunologic: Negative.   Neurological: Positive for headaches.  Hematological: Negative.   Psychiatric/Behavioral: Negative.     Social History   Socioeconomic History  . Marital status: Widowed    Spouse name: Not on file  . Number of children: 1  . Years of education: Not on file  . Highest education level: 12th grade  Occupational History  . Occupation: retired  Scientific laboratory technician  . Financial resource strain: Not hard at all  . Food insecurity:    Worry: Never true    Inability: Never true  . Transportation needs:    Medical: No    Non-medical: No  Tobacco Use  . Smoking status: Former Smoker    Packs/day: 0.00    Types: Cigarettes  . Smokeless tobacco: Never Used  . Tobacco comment: still smoking every now and then, rarely one  Substance and Sexual Activity  . Alcohol use: Yes    Alcohol/week: 0.0 oz    Comment: occasionally  . Drug use: No  . Sexual activity: Not on file  Lifestyle  . Physical activity:    Days per week: Not on  file    Minutes per session: Not on file  . Stress: Rather much  Relationships  . Social connections:    Talks on phone: Not on file    Gets together: Not on file    Attends religious service: Not on file    Active member of club or organization: Not on file    Attends meetings of clubs or organizations: Not on file    Relationship status: Not on file  . Intimate partner violence:    Fear of current or ex partner: Not on file    Emotionally abused: Not on file    Physically abused: Not on file    Forced sexual activity: Not on file  Other Topics Concern  . Not on file  Social History Narrative  . Not on file    Past Medical History:  Diagnosis Date  . Anemia   . Colon polyp   . Depression   . Hyperlipidemia   . Hypertension   . Hypothyroidism   . Irritable bowel disease   . Kidney stone   . Thyroid disease      Patient Active Problem List   Diagnosis Date Noted  . Hypoxia 06/23/2017  . Ureteral calculus 06/19/2017  . Nephrolithiasis 06/19/2017  . History of colon cancer 03/22/2015  . 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 Anemia, iron deficiency 10/23/2014  . HLD (hyperlipidemia) 10/23/2014  . Essential (  primary) hypertension 10/23/2014  . Acid reflux 10/23/2014  . Clinical depression 10/23/2014  . Peptic ulcer 10/23/2014  . Hypothyroidism, postop 10/23/2014  . Anemia 10/18/2014  . Arthritis 10/18/2014  . Chronic constipation 10/18/2014  . Sleep disturbance 10/18/2014  . Elevated liver enzymes 10/18/2014  . Chronic headache 10/18/2014  . History of renal calculi 10/18/2014  . Malignant neoplasm of cecum ([place]) 10/17/2013  . Colon cancer ([place]) 09/21/2013    Past Surgical History:  Procedure Laterality Date  . ABDOMINAL HYSTERECTOMY    . BREAST ENHANCEMENT SURGERY    . BREAST SURGERY    . CHOLECYSTECTOMY  2000  . COLECTOMY  09/2013  . COLONOSCOPY  09-13-13   Dr [name]  . COLONOSCOPY WITH PROPOFOL  N/A 10/18/2014   Procedure: COLONOSCOPY WITH PROPOFOL;  Surgeon: [name], MD;  Location: [facility];  Service: Endoscopy;  Laterality: N/A;  . CYSTOSCOPY/URETEROSCOPY/HOLMIUM LASER/STENT PLACEMENT Right 06/23/2017   Procedure: CYSTOSCOPY/URETEROSCOPY/HOLMIUM LASER/STENT PLACEMENT;  Surgeon: Abbie Sons, MD;  Location: [facility];  Service: Urology;  Laterality: Right;  . ESOPHAGOGASTRODUODENOSCOPY (EGD) WITH PROPOFOL N/A 01/03/2015   Procedure: ESOPHAGOGASTRODUODENOSCOPY (EGD) WITH PROPOFOL;  Surgeon: [name], MD;  Location: [facility];  Service: Endoscopy;  Laterality: N/A;  . EYE SURGERY     bilateral cataract   . FOOT SURGERY    . KIDNEY STONE SURGERY    . NASAL SINUS SURGERY    . PORT-A-CATH REMOVAL    . PORTACATH PLACEMENT  10/24/13  . THYROID SURGERY    . TONSILLECTOMY      Her family history includes Arthritis in her sister; Breast cancer in her maternal aunt and paternal aunt; COPD in her brother; Colon cancer in her unknown relative; Diabetes in her mother and sister; Heart disease in her mother; Hyperlipidemia in her sister.      Current Outpatient Medications:  .  aspirin-acetaminophen-caffeine (EXCEDRIN MIGRAINE) 250-250-65 MG tablet, Take 2 tablets by mouth 2 (two) times daily as needed for headache or migraine., Disp: 30 tablet, Rfl: 0 .  carboxymethylcellulose (REFRESH PLUS) 0.5 % SOLN, Place 1 drop into both eyes daily as needed (dry eyes). , Disp: , Rfl:  .  Ferrous Sulfate 28 MG TABS, Take 28 mg by mouth daily., Disp: , Rfl:  .  Garlic 2952 MG CAPS, Take 1,000 mg by mouth daily., Disp: , Rfl:  .  levothyroxine (SYNTHROID, LEVOTHROID) 100 MCG tablet, TAKE ONE TABLET BY MOUTH ONCE DAILY BEFORE BREAKFAST, Disp: 90 tablet, Rfl: 3 .  meclizine (ANTIVERT) 25 MG tablet, Take 1 tablet (25 mg total) by mouth 4 (four) times daily as needed for dizziness. Nausea or anxiety., Disp: 30 tablet, Rfl: 3 .  Melatonin 5 MG TABS, Take 5 mg by mouth at  bedtime as needed (sleep)., Disp: , Rfl:  .  Menthol, Topical Analgesic, (BIOFREEZE EX), Apply 1 application topically daily as needed (pain)., Disp: , Rfl:  .  metoprolol succinate (TOPROL-XL) 100 MG 24 hr tablet, Take 1 tablet (100 mg total) by mouth daily. Take with or immediately following a meal., Disp: 90 tablet, Rfl: 3 .  Multiple Vitamin (MULTIVITAMIN WITH MINERALS) TABS tablet, Take 1 tablet by mouth daily., Disp: , Rfl:  .  Potassium 99 MG TABS, Take 99 mg by mouth daily., Disp: , Rfl:   Patient Care Team: Brooks Kinnan, Vickki Muff, PA as PCP - General (Physician Assistant) Lequita Asal, MD as Referring Physician (Hematology and Oncology) Odette Fraction as Consulting Physician (Optometry) Bernardo Heater, Ronda Fairly, MD as Consulting  Physician (Urology) Vladimir Crofts, MD as Consulting Physician (Neurology)     Objective:   Vitals: BP (!) 152/96 (BP Location: Right Arm, Patient Position: Sitting, Cuff Size: Normal)   Pulse 75   Temp 98.6 F (37 C) (Oral)   Ht 5' 6"  (1.676 m)   Wt 173 lb 9.6 oz (78.7 kg)   SpO2 98%   BMI 28.02 kg/m   Physical Exam  Constitutional: She is oriented to person, place, and time. She appears well-developed and well-nourished.  HENT:  Head: Normocephalic and atraumatic.  Right Ear: External ear normal.  Left Ear: External ear normal.  Nose: Nose normal.  Mouth/Throat: Oropharynx is clear and moist.  Eyes: Pupils are equal, round, and reactive to light. Conjunctivae and EOM are normal. Right eye exhibits no discharge.  Neck: Normal range of motion. Neck supple. No tracheal deviation present. No thyromegaly present.  Cardiovascular: Normal rate, regular rhythm, normal heart sounds and intact distal pulses.  No murmur heard. Pulmonary/Chest: Effort normal and breath sounds normal. No respiratory distress. She has no wheezes. She has no rales. She exhibits no tenderness.  Abdominal: Soft. She exhibits no distension and no mass. There is no tenderness.  There is no rebound and no guarding.  Musculoskeletal: Normal range of motion. She exhibits no edema or tenderness.  Lymphadenopathy:    She has no cervical adenopathy.  Neurological: She is alert and oriented to person, place, and time. She has normal reflexes. She displays normal reflexes. No cranial nerve deficit. She exhibits normal muscle tone. Coordination normal.  Skin: Skin is warm and dry. No rash noted. No erythema.  Psychiatric: She has a normal mood and affect. Her behavior is normal. Judgment and thought content normal.    Activities of Daily Living In your present state of health, do you have any difficulty performing the following activities: 07/21/2017 06/23/2017  Hearing? N N  Vision? N N  Difficulty concentrating or making decisions? Y N  Walking or climbing stairs? N N  Dressing or bathing? N N  Doing errands, shopping? N N  Preparing Food and eating ? N -  Using the Toilet? N -  In the past six months, have you accidently leaked urine? Y -  Comment Due to stent.  -  Do you have problems with loss of bowel control? N -  Managing your Medications? N -  Managing your Finances? N -  Housekeeping or managing your Housekeeping? N -  Some recent data might be hidden    Fall Risk Assessment Fall Risk  07/21/2017 12/08/2016 06/18/2016 03/14/2016 01/04/2015  Falls in the past year? No No Yes No Yes  Number falls in past yr: - - 1 - 1  Injury with Fall? - - No - No  Comment - - - - minor bruising on leg. It has gone away. fall occurred 2-3 weeks ago  Risk for fall due to : - - - - Impaired balance/gait;Medication side effect  Risk for fall due to: Comment - - - - this started with the chemotherapy. The last treatment was in Jan 2016  Follow up - - Falls prevention discussed - -     Depression Screen PHQ 2/9 Scores 07/21/2017 12/08/2016 06/18/2016 06/18/2016  PHQ - 2 Score 6 4 6 6   PHQ- 9 Score 16 18 22 22   Exception Documentation - - - -    Assessment & Plan:    Annual  Physical Reviewed patient's Family Medical History Reviewed and updated list of patient's  medical providers Assessment of cognitive impairment was done Assessed patient's functional ability Established a written schedule for health screening [place] Completed and Reviewed  Exercise Activities and Dietary recommendations Goals    . Exercise 3x per week (30 min per time)     Recommend to start walking 3 days a week for at least 30 minutes at a time.        Immunization History  Administered Date(s) Administered  . MMR 12/24/1994  . Pneumococcal Conjugate-13 06/18/2016  . Pneumococcal Polysaccharide-23 07/21/2017  . Td 12/17/1994, 09/03/2004  . Tdap 05/15/2014  . Zoster 09/30/2011    Health Maintenance  Topic Date Due  . MAMMOGRAM  01/22/1996  . DEXA SCAN  03/24/2026 (Originally 01/22/2011)  . INFLUENZA VACCINE  10/22/2017  . TETANUS/TDAP  05/15/2024  . COLONOSCOPY  10/17/2024  . Hepatitis C Screening  Completed  . PNA vac Low Risk Adult  Completed     Discussed health benefits of physical activity, and encouraged her to engage in regular exercise appropriate for her age and condition.    ------------------------------------------------------------------------------------------------------------  1. Essential (primary) hypertension Still taking the Metoprolol Succinate 100 mg qd. Headaches have been less frequent since she stopped taking the Sertraline (has an appointment for follow up with her neurologist tomorrow - Dr. Melrose Nakayama). BP high today and will add HCTZ 25 mg qd. Recheck routine labs and follow up pending report. - hydrochlorothiazide (HYDRODIURIL) 25 MG tablet; Take 1 tablet (25 mg total) by mouth daily.  Dispense: 90 tablet; Refill: 3 - CBC with Differential/Platelet - Comprehensive metabolic panel - Lipid panel  2. Malignant neoplasm of cecum [facility]) Diagnosed in June 2015 by colonoscopy and had partial colectomy July 2015. Denies  abdominal pain or hematochezia/melena. Due for repeat colonoscopy in 2020.  3. Hypothyroidism, postop Tolerating Levothyroxine 100 mcg qd without intolerance to heat or cold. No tremor or palpitations. Will check labs. - CBC with Differential/Platelet - TSH - T4  4. Nephrolithiasis Had surgery for ureteroscopic removal of a 10 mm right mid ureteral calculus on 06-23-17. Still has stents in place and plans on follow up with Dr. Bernardo Heater (urologist) on 08-20-17 for stent removal. Feeling better. Recheck CBC and CMP - CBC with Differential/Platelet - Comprehensive metabolic panel  5. Iron deficiency anemia, unspecified iron deficiency anemia type No longer taking iron supplement. Recommend checking CBC and iron levels. Restart the Iron supplement and consider recheck with hematologist (Dr. Mike Gip). - CBC with Differential/Platelet - Iron - Iron and TIBC  6. Mixed hyperlipidemia Check CMP and Lipid Panel. Does not take a statin. Recommend she restrict fats in diet and recheck pending reports. - Comprehensive metabolic panel - Lipid panel   Vernie Murders, PA  [facility]

## 2017-07-30 ENCOUNTER — Other Ambulatory Visit: Payer: Self-pay

## 2017-07-30 LAB — T4: T4, Total: 7.6 ug/dL (ref 4.5–12.0)

## 2017-07-30 LAB — COMPREHENSIVE METABOLIC PANEL
ALT: 7 IU/L (ref 0–32)
AST: 12 IU/L (ref 0–40)
Albumin/Globulin Ratio: 1.3 (ref 1.2–2.2)
Albumin: 4.2 g/dL (ref 3.5–4.8)
Alkaline Phosphatase: 64 IU/L (ref 39–117)
BUN/Creatinine Ratio: 16 (ref 12–28)
BUN: 14 mg/dL (ref 8–27)
Bilirubin Total: 0.3 mg/dL (ref 0.0–1.2)
CO2: 25 mmol/L (ref 20–29)
Calcium: 9.4 mg/dL (ref 8.7–10.3)
Chloride: 105 mmol/L (ref 96–106)
Creatinine, Ser: 0.86 mg/dL (ref 0.57–1.00)
GFR calc Af Amer: 79 mL/min/{1.73_m2} (ref >59)
GFR calc non Af Amer: 68 mL/min/{1.73_m2} (ref >59)
Globulin, Total: 3.3 g/dL (ref 1.5–4.5)
Glucose: 82 mg/dL (ref 65–99)
Potassium: 3.7 mmol/L (ref 3.5–5.2)
Sodium: 145 mmol/L — ABNORMAL HIGH (ref 134–144)
Total Protein: 7.5 g/dL (ref 6.0–8.5)

## 2017-07-30 LAB — CBC WITH DIFFERENTIAL/PLATELET
Basophils Absolute: 0 10*3/uL (ref 0.0–0.2)
Basos: 1 %
EOS (ABSOLUTE): 0.1 10*3/uL (ref 0.0–0.4)
Eos: 3 %
Hematocrit: 31.7 % — ABNORMAL LOW (ref 34.0–46.6)
Hemoglobin: 10.1 g/dL — ABNORMAL LOW (ref 11.1–15.9)
Immature Grans (Abs): 0 10*3/uL (ref 0.0–0.1)
Immature Granulocytes: 0 %
Lymphocytes Absolute: 1 10*3/uL (ref 0.7–3.1)
Lymphs: 26 %
MCH: 29.3 pg (ref 26.6–33.0)
MCHC: 31.9 g/dL (ref 31.5–35.7)
MCV: 92 fL (ref 79–97)
Monocytes Absolute: 0.3 10*3/uL (ref 0.1–0.9)
Monocytes: 8 %
Neutrophils Absolute: 2.5 10*3/uL (ref 1.4–7.0)
Neutrophils: 62 %
Platelets: 254 10*3/uL (ref 150–379)
RBC: 3.45 x10E6/uL — ABNORMAL LOW (ref 3.77–5.28)
RDW: 14.8 % (ref 12.3–15.4)
WBC: 3.9 10*3/uL (ref 3.4–10.8)

## 2017-07-30 LAB — LIPID PANEL
Chol/HDL Ratio: 2.7 ratio (ref 0.0–4.4)
Cholesterol, Total: 211 mg/dL — ABNORMAL HIGH (ref 100–199)
HDL: 79 mg/dL (ref >39)
LDL Calculated: 119 mg/dL — ABNORMAL HIGH (ref 0–99)
Triglycerides: 64 mg/dL (ref 0–149)
VLDL Cholesterol Cal: 13 mg/dL (ref 5–40)

## 2017-07-30 LAB — IRON AND TIBC
Iron Saturation: 30 % (ref 15–55)
Iron: 94 ug/dL (ref 27–139)
Total Iron Binding Capacity: 317 ug/dL (ref 250–450)
UIBC: 223 ug/dL (ref 118–369)

## 2017-07-30 LAB — TSH: TSH: 0.276 u[IU]/mL — ABNORMAL LOW (ref 0.450–4.500)

## 2017-07-30 NOTE — Telephone Encounter (Signed)
Patient advised. Patient states she thought a RX was going to be sent to [facility] for depression. Please advise.

## 2017-07-30 NOTE — Telephone Encounter (Signed)
-----   Message from [name], [place] sent at 07/30/2017  8:05 AM EDT ----- Hgb slowly improving. Iron levels back to normal as well as potassium. Normal kidney function and glucose. Continue present medications and balanced diet. Recheck in 6 months.

## 2017-07-30 NOTE — Telephone Encounter (Signed)
Attempted to contact patient. No answer line was busy.

## 2017-07-30 NOTE — Telephone Encounter (Signed)
May try Lexapro 10 mg qd #30 and recheck progress with recheck of BP in 3-4 weeks.

## 2017-07-31 ENCOUNTER — Other Ambulatory Visit: Payer: Self-pay

## 2017-07-31 ENCOUNTER — Emergency Department: Payer: Medicare PPO

## 2017-07-31 ENCOUNTER — Encounter: Payer: Self-pay | Admitting: Emergency Medicine

## 2017-07-31 ENCOUNTER — Emergency Department
Admission: EM | Admit: 2017-07-31 | Discharge: 2017-07-31 | Disposition: A | Discharge status: Home / Self Care | Payer: Medicare PPO | Attending: Emergency Medicine | Admitting: Emergency Medicine

## 2017-07-31 DIAGNOSIS — Z79899 Other long term (current) drug therapy: Secondary | ICD-10-CM | POA: Insufficient documentation

## 2017-07-31 DIAGNOSIS — I10 Essential (primary) hypertension: Secondary | ICD-10-CM | POA: Diagnosis not present

## 2017-07-31 DIAGNOSIS — Z87891 Personal history of nicotine dependence: Secondary | ICD-10-CM | POA: Insufficient documentation

## 2017-07-31 DIAGNOSIS — R51 Headache: Secondary | ICD-10-CM | POA: Diagnosis not present

## 2017-07-31 DIAGNOSIS — E039 Hypothyroidism, unspecified: Secondary | ICD-10-CM | POA: Insufficient documentation

## 2017-07-31 DIAGNOSIS — Z85038 Personal history of other malignant neoplasm of large intestine: Secondary | ICD-10-CM | POA: Insufficient documentation

## 2017-07-31 DIAGNOSIS — Z85828 Personal history of other malignant neoplasm of skin: Secondary | ICD-10-CM | POA: Insufficient documentation

## 2017-07-31 DIAGNOSIS — R519 Headache, unspecified: Secondary | ICD-10-CM

## 2017-07-31 MED ORDER — BUTALBITAL-APAP-CAFFEINE 50-325-40 MG PO TABS
1.0000 | ORAL_TABLET | Freq: Once | ORAL | Status: AC
  Administered 2017-07-31: 1 via ORAL

## 2017-07-31 MED ORDER — BUTALBITAL-APAP-CAFFEINE 50-325-40 MG PO TABS: 1.0000 | ORAL_TABLET | Freq: Four times a day (QID) | ORAL | 0 refills | Status: DC | PRN

## 2017-07-31 MED ORDER — ESCITALOPRAM OXALATE 10 MG PO TABS: 10.0000 mg | ORAL_TABLET | Freq: Every day | ORAL | 0 refills | Status: DC

## 2017-07-31 MED ORDER — BUTALBITAL-APAP-CAFFEINE 50-325-40 MG PO TABS
ORAL_TABLET | ORAL | Status: AC
  Administered 2017-07-31: 1 via ORAL
  Filled 2017-07-31: qty 1

## 2017-07-31 NOTE — ED Triage Notes (Signed)
Pt to ED via POV c/o headache and high blood pressure. Pt states that she was started on HCTZ 2 days ago. Pt also states that she has a stent in her bladder. Pt states that she is supposed to have it taken out at the end of the month but that called her doctor and he is supposed to take the stent out on May 22. Pt states that she thinks the stent is making her sick. Pt is in NAD at this time.

## 2017-07-31 NOTE — Discharge Instructions (Addendum)
Please seek medical attention for any high fevers, chest pain, shortness of breath, change in behavior, persistent vomiting, bloody stool or any other new or concerning symptoms.  

## 2017-07-31 NOTE — Telephone Encounter (Signed)
Agree with need for evaluation of stent.

## 2017-07-31 NOTE — Telephone Encounter (Signed)
Patient advised. RX sent to [facility]. Patient states she is going to the ER today because she doesn't feel well and thinks it may be related to the "stent"

## 2017-07-31 NOTE — ED Provider Notes (Signed)
[facility] Emergency Department Provider Note   ____________________________________________   I have reviewed the triage vital signs and the nursing notes.   HISTORY  Chief Complaint Headache   History limited by: Not Limited   HPI Melissa Fitzgerald is a 72 y.o. female who presents to the emergency department today with primary complaint of headache. Patient states that she does have headaches from time to time. Last night she started having a headache that has persistent into today. She describes it as being located around her face. The patient tried taking a medication that was prescribed after her urinary stent placement but states that did not significantly relieve the headache. She has secondary complaint of high blood pressure. States that it has been high this past week. Did go to see her primary care doctor who prescribed HCTZ. She has tertiary complaint of urinary stent. She states that she would like the urinary stent removed. Feels it is making her feel sick.  Per medical record review patient has a history of HTN, HLD.   Past Medical History:  Diagnosis Date  . Anemia   . Colon polyp   . Depression   . Hyperlipidemia   . Hypertension   . Hypothyroidism   . Irritable bowel disease   . Kidney stone   . Thyroid disease     Patient Active Problem List   Diagnosis Date Noted  . Hypoxia 06/23/2017  . Ureteral calculus 06/19/2017  . Nephrolithiasis 06/19/2017  . History of colon cancer 03/22/2015  . 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 Anemia, iron deficiency 10/23/2014  . HLD (hyperlipidemia) 10/23/2014  . Essential (primary) hypertension 10/23/2014  . Acid reflux 10/23/2014  . Clinical depression 10/23/2014  . Peptic ulcer 10/23/2014  . Hypothyroidism, postop 10/23/2014  . Anemia 10/18/2014  . Arthritis 10/18/2014  . Chronic constipation 10/18/2014  . Sleep disturbance 10/18/2014   . Elevated liver enzymes 10/18/2014  . Chronic headache 10/18/2014  . History of renal calculi 10/18/2014  . Malignant neoplasm of cecum ([place]) 10/17/2013  . Colon cancer ([place]) 09/21/2013    Past Surgical History:  Procedure Laterality Date  . ABDOMINAL HYSTERECTOMY    . BREAST ENHANCEMENT SURGERY    . BREAST SURGERY    . CHOLECYSTECTOMY  2000  . COLECTOMY  09/2013  . COLONOSCOPY  09-13-13   Dr [name]  . COLONOSCOPY WITH PROPOFOL N/A 10/18/2014   Procedure: COLONOSCOPY WITH PROPOFOL;  Surgeon: [name], MD;  Location: [facility] ENDOSCOPY;  Service: Endoscopy;  Laterality: N/A;  . CYSTOSCOPY/URETEROSCOPY/HOLMIUM LASER/STENT PLACEMENT Right 06/23/2017   Procedure: CYSTOSCOPY/URETEROSCOPY/HOLMIUM LASER/STENT PLACEMENT;  Surgeon: Abbie Sons, MD;  Location: [facility] ORS;  Service: Urology;  Laterality: Right;  . ESOPHAGOGASTRODUODENOSCOPY (EGD) WITH PROPOFOL N/A 01/03/2015   Procedure: ESOPHAGOGASTRODUODENOSCOPY (EGD) WITH PROPOFOL;  Surgeon: [name], MD;  Location: [facility] ENDOSCOPY;  Service: Endoscopy;  Laterality: N/A;  . EYE SURGERY     bilateral cataract   . FOOT SURGERY    . KIDNEY STONE SURGERY    . NASAL SINUS SURGERY    . PORT-A-CATH REMOVAL    . PORTACATH PLACEMENT  10/24/13  . THYROID SURGERY    . TONSILLECTOMY      Prior to Admission medications   Medication Sig Start Date End Date Taking? Authorizing Provider  aspirin-acetaminophen-caffeine (EXCEDRIN MIGRAINE) [phone] MG tablet Take 2 tablets by mouth 2 (two) times daily as needed for headache or migraine. 06/26/17   Gouru, Illene Silver,  MD  carboxymethylcellulose (REFRESH PLUS) 0.5 % SOLN Place 1 drop into both eyes daily as needed (dry eyes).     [provider]  escitalopram (LEXAPRO) 10 MG tablet Take 1 tablet (10 mg total) by mouth daily. 07/31/17   Chrismon, Vickki Muff, PA  Ferrous Sulfate 28 MG TABS Take 28 mg by mouth daily.    [provider]  Garlic 2355 MG CAPS Take 1,000 mg by mouth  daily.    [provider]  hydrochlorothiazide (HYDRODIURIL) 25 MG tablet Take 1 tablet (25 mg total) by mouth daily. 07/28/17   Chrismon, Vickki Muff, PA  levothyroxine (SYNTHROID, LEVOTHROID) 100 MCG tablet TAKE ONE TABLET BY MOUTH ONCE DAILY BEFORE BREAKFAST 02/11/17   Chrismon, Vickki Muff, PA  meclizine (ANTIVERT) 25 MG tablet Take 1 tablet (25 mg total) by mouth 4 (four) times daily as needed for dizziness. Nausea or anxiety. 05/11/15   Chrismon, Vickki Muff, PA  Melatonin 5 MG TABS Take 5 mg by mouth at bedtime as needed (sleep).    [provider]  Menthol, Topical Analgesic, (BIOFREEZE EX) Apply 1 application topically daily as needed (pain).    [provider]  metoprolol succinate (TOPROL-XL) 100 MG 24 hr tablet Take 1 tablet (100 mg total) by mouth daily. Take with or immediately following a meal. 05/26/17   Chrismon, Vickki Muff, PA  Multiple Vitamin (MULTIVITAMIN WITH MINERALS) TABS tablet Take 1 tablet by mouth daily.    [provider]  Potassium 99 MG TABS Take 99 mg by mouth daily.    [provider]    Allergies Codeine; Oxycodone; Penicillins; Tramadol; and Trintellix [vortioxetine]  Family History  Problem Relation Age of Onset  . Heart disease Mother   . Diabetes Mother   . Arthritis Sister   . Hyperlipidemia Sister   . COPD Brother   . Diabetes Sister   . Colon cancer Unknown        first cousin  . Breast cancer Maternal Aunt   . Breast cancer Paternal Aunt     Social History Social History   Tobacco Use  . Smoking status: Former Smoker    Packs/day: 0.00    Types: Cigarettes  . Smokeless tobacco: Never Used  . Tobacco comment: still smoking every now and then, rarely one  Substance Use Topics  . Alcohol use: Yes    Alcohol/week: 0.0 oz    Comment: occasionally  . Drug use: No    Review of Systems Constitutional: No fever/chills Eyes: No visual changes. ENT: No sore throat. Cardiovascular: Denies chest  pain. Respiratory: Denies shortness of breath. Gastrointestinal: No abdominal pain.  No nausea, no vomiting.  No diarrhea.   Genitourinary: Negative for dysuria. Musculoskeletal: Negative for back pain. Skin: Negative for rash. Neurological: Positive for headache ____________________________________________   PHYSICAL EXAM:  VITAL SIGNS: ED Triage Vitals  Enc Vitals Group     BP 07/31/17 1040 (!) 157/83     Pulse Rate 07/31/17 1040 80     Resp 07/31/17 1040 16     Temp 07/31/17 1040 98.4 F (36.9 C)     Temp Source 07/31/17 1040 Oral     SpO2 07/31/17 1040 99 %     Weight 07/31/17 1042 173 lb (78.5 kg)     Height 07/31/17 1042 5\' 6"  (1.676 m)     Head Circumference --      Peak Flow --      Pain Score 07/31/17 1042 8   Constitutional: Alert and  oriented. Well appearing and in no distress. Eyes: Conjunctivae are normal.  ENT   Head: Normocephalic and atraumatic.   Nose: No congestion/rhinnorhea.   Mouth/Throat: Mucous membranes are moist.   Neck: No stridor. Hematological/Lymphatic/Immunilogical: No cervical lymphadenopathy. Cardiovascular: Normal rate, regular rhythm.  No murmurs, rubs, or gallops.  Respiratory: Normal respiratory effort without tachypnea nor retractions. Breath sounds are clear and equal bilaterally. No wheezes/rales/rhonchi. Gastrointestinal: Soft and non tender. No rebound. No guarding.  Genitourinary: Deferred Musculoskeletal: Normal range of motion in all extremities. No lower extremity edema. Neurologic:  Normal speech and language. No gross focal neurologic deficits are appreciated.  Skin:  Skin is warm, dry and intact. No rash noted. Psychiatric: Mood and affect are normal. Speech and behavior are normal. Patient exhibits appropriate insight and judgment.  ____________________________________________    LABS (pertinent  positives/negatives)  None  ____________________________________________   EKG  None  ____________________________________________    RADIOLOGY  CT head No concerning acute findings  ____________________________________________   PROCEDURES  Procedures  ____________________________________________   INITIAL IMPRESSION / ASSESSMENT AND PLAN / ED COURSE  Pertinent labs & imaging results that were available during my care of the patient were reviewed by me and considered in my medical decision making (see chart for details).  Patient presented to the emergency department today because of primary concern for headache. Patient was given fioricet with good relief of the headache. Had CT head performed to evaluate for mass or acute findings. Head CT was reassuring. Will discharge with prescription for fioricet. Discussed findings and plan with patient.    ____________________________________________   FINAL CLINICAL IMPRESSION(S) / ED DIAGNOSES  Final diagnoses:  Bad headache     Note: This dictation was prepared with Dragon dictation. Any transcriptional errors that result from this process are unintentional     Nance Pear, MD 07/31/17 [phone]

## 2017-08-10 ENCOUNTER — Encounter: Payer: Self-pay | Admitting: Family Medicine

## 2017-08-10 ENCOUNTER — Ambulatory Visit: Payer: Medicare PPO | Admitting: Family Medicine

## 2017-08-10 VITALS — BP 140/88 | HR 60 | Temp 98.7°F | Resp 16 | Wt 171.0 lb

## 2017-08-10 DIAGNOSIS — I10 Essential (primary) hypertension: Secondary | ICD-10-CM | POA: Diagnosis not present

## 2017-08-10 DIAGNOSIS — G4489 Other headache syndrome: Secondary | ICD-10-CM | POA: Diagnosis not present

## 2017-08-10 MED ORDER — BUTALBITAL-APAP-CAFFEINE 50-325-40 MG PO TABS: 1.0000 | ORAL_TABLET | Freq: Four times a day (QID) | ORAL | 0 refills | Status: DC | PRN

## 2017-08-10 NOTE — Progress Notes (Signed)
Patient: Melissa Fitzgerald Female    DOB: 1945-08-31   72 y.o.   MRN: [id number] Visit Date: 08/10/2017  Today's Provider: Vernie Murders, PA   Chief Complaint  Patient presents with  . Headache   Subjective:    Headache   This is a recurrent (patient returns to office today for follow up after being seen in ED 07/31/17 with complaints of headache and elevated blood pressure) problem. The pain is located in the bilateral region. The pain does not radiate. The quality of the pain is described as throbbing and squeezing. Pertinent negatives include no abdominal pain, abnormal behavior, anorexia, back pain, blurred vision, coughing, dizziness, drainage, ear pain, eye pain, eye redness, eye watering, facial sweating, fever, hearing loss, insomnia, loss of balance, muscle aches, nausea, neck pain, numbness, phonophobia, photophobia, rhinorrhea, scalp tenderness, seizures, sinus pressure, sore throat, swollen glands, tingling, tinnitus, visual change, vomiting, weakness or weight loss. Nothing (patient reports that she has more headaches) aggravates the symptoms. She has tried Excedrin and beta blockers for the symptoms. Improvement on treatment: patient states that butalbital-acetaminophen  has helped relieve symptoms. Her past medical history is significant for hypertension.      Past Medical History:  Diagnosis Date  . Anemia   . Colon polyp   . Depression   . Hyperlipidemia   . Hypertension   . Hypothyroidism   . Irritable bowel disease   . Kidney stone   . Thyroid disease    Past Surgical History:  Procedure Laterality Date  . ABDOMINAL HYSTERECTOMY    . BREAST ENHANCEMENT SURGERY    . BREAST SURGERY    . CHOLECYSTECTOMY  2000  . COLECTOMY  09/2013  . COLONOSCOPY  09-13-13   Dr [name]  . COLONOSCOPY WITH PROPOFOL N/A 10/18/2014   Procedure: COLONOSCOPY WITH PROPOFOL;  Surgeon: [name], MD;  Location: [facility] ENDOSCOPY;  Service: Endoscopy;  Laterality: N/A;  .  CYSTOSCOPY/URETEROSCOPY/HOLMIUM LASER/STENT PLACEMENT Right 06/23/2017   Procedure: CYSTOSCOPY/URETEROSCOPY/HOLMIUM LASER/STENT PLACEMENT;  Surgeon: Abbie Sons, MD;  Location: [facility] ORS;  Service: Urology;  Laterality: Right;  . ESOPHAGOGASTRODUODENOSCOPY (EGD) WITH PROPOFOL N/A 01/03/2015   Procedure: ESOPHAGOGASTRODUODENOSCOPY (EGD) WITH PROPOFOL;  Surgeon: [name], MD;  Location: [facility] ENDOSCOPY;  Service: Endoscopy;  Laterality: N/A;  . EYE SURGERY     bilateral cataract   . FOOT SURGERY    . KIDNEY STONE SURGERY    . NASAL SINUS SURGERY    . PORT-A-CATH REMOVAL    . PORTACATH PLACEMENT  10/24/13  . THYROID SURGERY    . TONSILLECTOMY     Family History  Problem Relation Age of Onset  . Heart disease Mother   . Diabetes Mother   . Arthritis Sister   . Hyperlipidemia Sister   . COPD Brother   . Diabetes Sister   . Colon cancer Unknown        first cousin  . Breast cancer Maternal Aunt   . Breast cancer Paternal Aunt    Allergies  Allergen Reactions  . Codeine Nausea And Vomiting  . Oxycodone Nausea And Vomiting  . Penicillins Nausea And Vomiting    Has patient had a PCN reaction causing immediate rash, facial/tongue/throat swelling, SOB or lightheadedness with hypotension: No Has patient had a PCN reaction causing severe rash involving mucus membranes or skin necrosis: No Has patient had a PCN reaction that required hospitalization: No Has patient had a PCN reaction occurring within the last 10 years: No If  all of the above answers are "NO", then may proceed with Cephalosporin use.   . Tramadol Nausea And Vomiting  . Trintellix [Vortioxetine] Itching    Current Outpatient Medications:  .  aspirin-acetaminophen-caffeine (EXCEDRIN MIGRAINE) 250-250-65 MG tablet, Take 2 tablets by mouth 2 (two) times daily as needed for headache or migraine., Disp: 30 tablet, Rfl: 0 .  butalbital-acetaminophen-caffeine (FIORICET, ESGIC) 50-325-40 MG tablet, Take 1 tablet by  mouth every 6 (six) hours as needed for headache., Disp: 10 tablet, Rfl: 0 .  carboxymethylcellulose (REFRESH PLUS) 0.5 % SOLN, Place 1 drop into both eyes daily as needed (dry eyes). , Disp: , Rfl:  .  escitalopram (LEXAPRO) 10 MG tablet, Take 1 tablet (10 mg total) by mouth daily., Disp: 30 tablet, Rfl: 0 .  Ferrous Sulfate 28 MG TABS, Take 28 mg by mouth daily., Disp: , Rfl:  .  Garlic 3244 MG CAPS, Take 1,000 mg by mouth daily., Disp: , Rfl:  .  hydrochlorothiazide (HYDRODIURIL) 25 MG tablet, Take 1 tablet (25 mg total) by mouth daily., Disp: 90 tablet, Rfl: 3 .  levothyroxine (SYNTHROID, LEVOTHROID) 100 MCG tablet, TAKE ONE TABLET BY MOUTH ONCE DAILY BEFORE BREAKFAST, Disp: 90 tablet, Rfl: 3 .  meclizine (ANTIVERT) 25 MG tablet, Take 1 tablet (25 mg total) by mouth 4 (four) times daily as needed for dizziness. Nausea or anxiety., Disp: 30 tablet, Rfl: 3 .  Melatonin 5 MG TABS, Take 5 mg by mouth at bedtime as needed (sleep)., Disp: , Rfl:  .  Menthol, Topical Analgesic, (BIOFREEZE EX), Apply 1 application topically daily as needed (pain)., Disp: , Rfl:  .  metoprolol succinate (TOPROL-XL) 100 MG 24 hr tablet, Take 1 tablet (100 mg total) by mouth daily. Take with or immediately following a meal., Disp: 90 tablet, Rfl: 3 .  Multiple Vitamin (MULTIVITAMIN WITH MINERALS) TABS tablet, Take 1 tablet by mouth daily., Disp: , Rfl:  .  Potassium 99 MG TABS, Take 99 mg by mouth daily., Disp: , Rfl:   Review of Systems  Constitutional: Negative for fever and weight loss.  HENT: Negative for ear pain, hearing loss, rhinorrhea, sinus pressure, sore throat and tinnitus.   Eyes: Negative for blurred vision, photophobia, pain and redness.  Respiratory: Negative for cough.   Gastrointestinal: Negative for abdominal pain, anorexia, nausea and vomiting.  Musculoskeletal: Negative for back pain and neck pain.  Neurological: Positive for headaches. Negative for dizziness, tingling, seizures, weakness, numbness  and loss of balance.  Psychiatric/Behavioral: The patient does not have insomnia.    Social History   Tobacco Use  . Smoking status: Former Smoker    Packs/day: 0.00    Types: Cigarettes  . Smokeless tobacco: Never Used  . Tobacco comment: still smoking every now and then, rarely one  Substance Use Topics  . Alcohol use: Yes    Alcohol/week: 0.0 oz    Comment: occasionally   Objective:   BP 140/88   Pulse 60   Temp 98.7 F (37.1 C) (Oral)   Resp 16   Wt 171 lb (77.6 kg)   SpO2 96%   BMI 27.60 kg/m  Vitals:   08/10/17 1325  BP: 140/88  Pulse: 60  Resp: 16  Temp: 98.7 F (37.1 C)  TempSrc: Oral  SpO2: 96%  Weight: 171 lb (77.6 kg)   Physical Exam  Constitutional: She is oriented to person, place, and time. She appears well-developed and well-nourished. No distress.  HENT:  Head: Normocephalic and atraumatic.  Right Ear: Hearing normal.  Left Ear: Hearing normal.  Nose: Nose normal.  Eyes: Conjunctivae and lids are normal. Right eye exhibits no discharge. Left eye exhibits no discharge. No scleral icterus.  Cardiovascular: Normal rate and regular rhythm.  Pulmonary/Chest: Effort normal and breath sounds normal. No respiratory distress.  Abdominal: Soft. Bowel sounds are normal.  Musculoskeletal: Normal range of motion.  Neurological: She is alert and oriented to person, place, and time.  Skin: Skin is intact. No lesion and no rash noted.  Psychiatric: She has a normal mood and affect. Her speech is normal and behavior is normal. Thought content normal.      Assessment & Plan:     1. Chronic mixed headache syndrome Having some headache daily with intermittent "migraines". Went to neurologist (Dr. Melrose Nakayama) and treated migraine component (photosensitivity, phonosensitivity and nausea with pounding severe headache) with Maxalt and Nortriptyline. States neither helped significantly. Went to the ER on 07-31-17 with normal CT scan and treated with Fioricet for mixed  tension and vascular headache. Some tenderness/soreness of scalp around the "hatband" distribution of scalp. Fioricet 1 tablet daily (on average) the past 10 days has helped a great deal to relieve pain. Requests refill and advised to proceed with headache clinic follow up as recommended by neurologist to assess progress. Patient agreed to attend appointment for follow up. - butalbital-acetaminophen-caffeine (FIORICET, ESGIC) 50-325-40 MG tablet; Take 1 tablet by mouth every 6 (six) hours as needed for headache.  Dispense: 20 tablet; Refill: 0  2. Essential (primary) hypertension BP improved. States she is still taking the HCTZ 25 mg qd and was taking only 50 mg Metoprolol Succinate daily. When she realized she was supposed to be on 100 mg qd, she started taking two tablets daily (the past 1-2 days). Continue at this level and follow up BP in 3-4 months.      Vernie Murders, PA  [facility]

## 2017-08-12 ENCOUNTER — Ambulatory Visit (INDEPENDENT_AMBULATORY_CARE_PROVIDER_SITE_OTHER): Payer: Medicare PPO | Admitting: Urology

## 2017-08-12 ENCOUNTER — Encounter: Payer: Self-pay | Admitting: Urology

## 2017-08-12 VITALS — BP 158/87 | HR 60 | Ht 66.0 in | Wt 170.4 lb

## 2017-08-12 DIAGNOSIS — Z09 Encounter for follow-up examination after completed treatment for conditions other than malignant neoplasm: Secondary | ICD-10-CM | POA: Diagnosis not present

## 2017-08-12 DIAGNOSIS — N201 Calculus of ureter: Secondary | ICD-10-CM

## 2017-08-12 LAB — URINALYSIS, COMPLETE
Bilirubin, UA: NEGATIVE
Glucose, UA: NEGATIVE
Ketones, UA: NEGATIVE
Leukocytes, UA: NEGATIVE
Nitrite, UA: NEGATIVE
Specific Gravity, UA: 1.025 (ref 1.005–1.030)
Urobilinogen, Ur: 0.2 mg/dL (ref 0.2–1.0)
pH, UA: 5.5 (ref 5.0–7.5)

## 2017-08-12 LAB — MICROSCOPIC EXAMINATION: RBC, UA: >30 /hpf — ABNORMAL HIGH (ref 0–2)

## 2017-08-12 NOTE — Progress Notes (Signed)
Indications: Patient is 72 y.o., female who recently underwent ureteroscopic removal of a 10 mm right mid ureteral calculus on 06/23/2017.  The procedure was complicated by a ureteral perforation and she is presenting today for stent removal.  Procedure:  Flexible Cystoscopy with stent removal (27741)  Timeout was performed and the correct patient, procedure and participants were identified.    Description:  The patient was prepped and draped in the usual sterile fashion. Flexible cystosopy was performed.  The stent was visualized, grasped, and removed intact without difficulty. The patient tolerated the procedure well.  A single dose of oral antibiotics was given.  Complications:  None  Plan: She was instructed to call for development of right flank pain and will otherwise follow-up in 4-6 weeks with a renal ultrasound.

## 2017-08-17 ENCOUNTER — Other Ambulatory Visit: Payer: Self-pay | Admitting: Family Medicine

## 2017-08-20 ENCOUNTER — Other Ambulatory Visit: Payer: Medicare PPO | Admitting: Urology

## 2017-09-18 ENCOUNTER — Other Ambulatory Visit: Payer: Self-pay | Admitting: Family Medicine

## 2017-09-18 DIAGNOSIS — G4489 Other headache syndrome: Secondary | ICD-10-CM

## 2017-09-30 ENCOUNTER — Ambulatory Visit: Payer: Medicare PPO | Admitting: Urology

## 2017-10-25 ENCOUNTER — Other Ambulatory Visit: Payer: Self-pay | Admitting: Family Medicine

## 2017-10-25 DIAGNOSIS — G4489 Other headache syndrome: Secondary | ICD-10-CM

## 2017-12-14 ENCOUNTER — Other Ambulatory Visit: Payer: Self-pay | Admitting: Family Medicine

## 2017-12-14 ENCOUNTER — Telehealth: Payer: Self-pay | Admitting: Family Medicine

## 2017-12-14 DIAGNOSIS — I10 Essential (primary) hypertension: Secondary | ICD-10-CM

## 2017-12-14 DIAGNOSIS — E890 Postprocedural hypothyroidism: Secondary | ICD-10-CM

## 2017-12-14 MED ORDER — METOPROLOL SUCCINATE ER 100 MG PO TB24: 100.0000 mg | ORAL_TABLET | Freq: Every day | ORAL | 3 refills | Status: DC

## 2017-12-14 MED ORDER — LEVOTHYROXINE SODIUM 100 MCG PO TABS: ORAL_TABLET | ORAL | 3 refills | Status: DC

## 2017-12-14 NOTE — Telephone Encounter (Signed)
Please review, last office visit for HTN was 08/10/17, Hypothyroidism o.v 07/28/17, labs last drawn was 07/29/17. KW

## 2017-12-14 NOTE — Telephone Encounter (Signed)
Reviewed labs and reordered Metoprolol and Levothyroxine at [facility]. Remind patient to schedule follow up appointment in November 2019.

## 2017-12-14 NOTE — Telephone Encounter (Signed)
pt needs a refill on   Metoprolol 100 mg  Levothyroxine 100 MCG  She now uses [facility]   She does not use Mail order anymore...  CB#  [phone]  Thanks  Con Memos

## 2017-12-14 NOTE — Telephone Encounter (Signed)
Patient has been advised. KW 

## 2017-12-17 ENCOUNTER — Encounter: Payer: Self-pay | Admitting: Family Medicine

## 2017-12-17 ENCOUNTER — Ambulatory Visit (INDEPENDENT_AMBULATORY_CARE_PROVIDER_SITE_OTHER): Payer: Medicare HMO | Admitting: Family Medicine

## 2017-12-17 VITALS — BP 148/98 | HR 75 | Temp 98.3°F | Wt 168.4 lb

## 2017-12-17 DIAGNOSIS — I10 Essential (primary) hypertension: Secondary | ICD-10-CM | POA: Diagnosis not present

## 2017-12-17 DIAGNOSIS — M25511 Pain in right shoulder: Secondary | ICD-10-CM

## 2017-12-17 MED ORDER — HYDROCHLOROTHIAZIDE 25 MG PO TABS: 25.0000 mg | ORAL_TABLET | Freq: Every day | ORAL | 3 refills | Status: DC

## 2017-12-17 MED ORDER — MELOXICAM 15 MG PO TABS: 15.0000 mg | ORAL_TABLET | Freq: Every day | ORAL | 0 refills | Status: DC

## 2017-12-17 NOTE — Progress Notes (Signed)
Patient: Melissa Fitzgerald Female    DOB: 02-18-1946   72 y.o.   MRN: [id number] Visit Date: 12/17/2017  Today's Provider: Lavon Paganini, MD   Chief Complaint  Patient presents with  . Arm Pain   Subjective:    Arm Pain   Incident onset: 5 days ago. Injury mechanism: lifting a chair. The pain is present in the right shoulder and right forearm. The pain has been constant since the incident. Associated symptoms comments: Bruising, swelling, and pain. Treatments tried: Ibuprofen 800 mg. The treatment provided mild relief.   She felt a pop in her shoulder as she lifted a chair.  Bruising has settled at the elbow and gotten worse over past five days.  She has some soreness, but not significant pain.  She states she did not seek care sooner because her range of motion and strength seem to be intact.  She is concerned about the significant bruising however.  She has not injured this arm previously.    Allergies  Allergen Reactions  . Codeine Nausea And Vomiting  . Oxycodone Nausea And Vomiting  . Penicillins Nausea And Vomiting    Has patient had a PCN reaction causing immediate rash, facial/tongue/throat swelling, SOB or lightheadedness with hypotension: No Has patient had a PCN reaction causing severe rash involving mucus membranes or skin necrosis: No Has patient had a PCN reaction that required hospitalization: No Has patient had a PCN reaction occurring within the last 10 years: No If all of the above answers are "NO", then may proceed with Cephalosporin use.   . Tramadol Nausea And Vomiting  . Trintellix [Vortioxetine] Itching     Current Outpatient Medications:  .  aspirin-acetaminophen-caffeine (EXCEDRIN MIGRAINE) 250-250-65 MG tablet, Take 2 tablets by mouth 2 (two) times daily as needed for headache or migraine., Disp: 30 tablet, Rfl: 0 .  carboxymethylcellulose (REFRESH PLUS) 0.5 % SOLN, Place 1 drop into both eyes daily as needed (dry eyes). , Disp: , Rfl:  .   escitalopram (LEXAPRO) 10 MG tablet, TAKE 1 TABLET BY MOUTH ONCE DAILY, Disp: 30 tablet, Rfl: 6 .  Ferrous Sulfate 28 MG TABS, Take 28 mg by mouth daily., Disp: , Rfl:  .  hydrochlorothiazide (HYDRODIURIL) 25 MG tablet, Take 1 tablet (25 mg total) by mouth daily., Disp: 90 tablet, Rfl: 3 .  levothyroxine (SYNTHROID, LEVOTHROID) 100 MCG tablet, TAKE ONE TABLET BY MOUTH ONCE DAILY BEFORE BREAKFAST, Disp: 90 tablet, Rfl: 3 .  Menthol, Topical Analgesic, (BIOFREEZE EX), Apply 1 application topically daily as needed (pain)., Disp: , Rfl:  .  metoprolol succinate (TOPROL-XL) 100 MG 24 hr tablet, Take 1 tablet (100 mg total) by mouth daily. Take with or immediately following a meal., Disp: 90 tablet, Rfl: 3  Review of Systems  Constitutional: Negative.   Respiratory: Negative.   Cardiovascular: Negative.   Musculoskeletal: Negative.        Right arm pain     Social History   Tobacco Use  . Smoking status: Former Smoker    Packs/day: 0.00    Types: Cigarettes  . Smokeless tobacco: Never Used  . Tobacco comment: still smoking every now and then, rarely one  Substance Use Topics  . Alcohol use: Yes    Alcohol/week: 0.0 standard drinks    Comment: occasionally   Objective:   BP (!) 148/98 (BP Location: Left Arm, Patient Position: Sitting, Cuff Size: Normal) Comment: Patient has not been taking Metoprolol  Pulse 75   Temp 98.3 F (  36.8 C) (Oral)   Wt 168 lb 6.4 oz (76.4 kg)   SpO2 98%   BMI 27.18 kg/m  Vitals:   12/17/17 1451  BP: (!) 148/98  Pulse: 75  Temp: 98.3 F (36.8 C)  TempSrc: Oral  SpO2: 98%  Weight: 168 lb 6.4 oz (76.4 kg)     Physical Exam  Constitutional: She is oriented to person, place, and time. She appears well-developed and well-nourished. No distress.  HENT:  Head: Normocephalic and atraumatic.  Eyes: Conjunctivae are normal.  Neck: Neck supple. No thyromegaly present.  Cardiovascular: Normal rate, regular rhythm, normal heart sounds and intact distal  pulses.  No murmur heard. Pulmonary/Chest: Effort normal and breath sounds normal. No respiratory distress. She has no wheezes. She has no rales.  Musculoskeletal:  Right shoulder/arm: Significant bruising around medial arm and elbow.  Mild tenderness to palpation over bicipital groove and coracoid process of right shoulder.  Range of motion is intact.  Strength and sensation are intact.  Intact distal pulses.  Appears to have slight Popeye deformity  Lymphadenopathy:    She has no cervical adenopathy.  Neurological: She is alert and oriented to person, place, and time.  Skin: Skin is warm and dry. Capillary refill takes less than 2 seconds. No rash noted.  Psychiatric: She has a normal mood and affect. Her behavior is normal.  Vitals reviewed.       Assessment & Plan:    Problem List Items Addressed This Visit      Cardiovascular and Mediastinum   Essential (primary) hypertension    Chronic and uncontrolled When reviewing medications with the patient today, does not seem that she has been taking her hydrochlorothiazide I have sent a new prescription for this to the pharmacy I advised that she follow-up with her PCP on her blood pressure when she is taking all of her medications Advised her to bring all of her medications to every visit      Relevant Medications   hydrochlorothiazide (HYDRODIURIL) 25 MG tablet    Other Visit Diagnoses    Acute pain of right shoulder    -  Primary   Relevant Orders   Ambulatory referral to Orthopedic Surgery    -New problem -Exam and history are concerning for possible proximal biceps tendon rupture -She is not in acute pain and does not have any significant tenderness anywhere, so I do not think an x-ray would be of much benefit at this time -We will refer her to orthopedics for further evaluation and management of possible biceps tendon rupture -Advised rest, ice, elevation -May use Mobic as needed -Advised not to take this with any other  NSAIDs -Return precautions discussed   Return in about 6 weeks (around 01/28/2018) for BP f/u with PCP.   The entirety of the information documented in the History of Present Illness, Review of Systems and Physical Exam were personally obtained by me. Portions of this information were initially documented by Tiburcio Pea, CMA and reviewed by me for thoroughness and accuracy.    Virginia Crews, MD, MPH [facility] 12/18/2017 4:30 PM

## 2017-12-17 NOTE — Patient Instructions (Signed)
Biceps Tendon Disruption (Proximal) The proximal biceps tendon is a strong cord of tissue that connects the biceps muscle, on the front of the upper arm, to the shoulder blade. A proximal biceps tendon disruption can include a partial or complete tear of the tendon near where it connects to the bone near the shoulder. A proximal biceps tendon disruption can interfere with the ability to lift the arm in front of the body, stabilize the shoulder, bend the elbow, and turn the hand palm-up (supination). What are the causes? A biceps tendon disruption happens when the tendon is exposed to too much force. This excess force may be caused by:  The elbow being suddenly straightened from a bent position because of an external force. This could happen, for example, while catching a heavy weight or being pulled when waterskiing.  Wear and tear from physical activity.  Breaking a fall with your hand.  What increases the risk? The following factors may make you more likely to develop this condition:  Playing contact sports.  Doing activities or sports that involve throwing or overhead movements, such as racket sports, gymnastics, or baseball.  Doing activities or sports that involve putting sudden force on the arm, such as weightlifting or waterskiing.  Having a weakened tendon. The tendon may be weak because of: ? Long-lasting (chronic) biceps tendinitis. ? Certain medical conditions, such as diabetes or rheumatoid arthritis. ? Repeated corticosteroid use. ? Repetitive overhead movements.  What are the signs or symptoms? Symptoms of this condition may include:  Sudden sharp pain in the front of the shoulder. Pain may get worse during certain movements, such as: ? Lifting or carrying objects. ? Straightening the elbow. ? Throwing or using overhead movements.  Inflammation or a feeling of unusual warmth on the front of the shoulder.  Painful tightening (spasm) of the biceps muscle.  A bulge on  the inside of the upper arm when the elbow is bent.  Bruising in the shoulder or upper arm. This may develop 24-48 hours after the tendon is injured.  Limited range of motion of the shoulder and elbow.  Weakness in the elbow and forearm when: ? Bending the elbow. ? Rotating the wrist.  How is this diagnosed? This condition is diagnosed based on your symptoms, your medical history, and a physical exam. Your health care provider may test the strength and range of motion of your shoulder and elbow. You may have imaging tests, such as X-rays, MRI, or ultrasound. How is this treated? This condition is treated by resting and icing the injured area, and by doing physical therapy exercises. Depending on the severity of your condition, treatment may also include:  Medicines to help relieve pain and inflammation.  Avoiding certain activities that put stress on your shoulder.  One or more injections of medicines (corticosteroids) into your upper arm to help reduce inflammation (rare).  Surgery to repair the tear. This may be needed if nonsurgical treatments do not improve your condition.  Follow these instructions at home: Managing pain, stiffness, and swelling  If directed, put ice on the injured area: ? Put ice in a plastic bag. ? Place a towel between your skin and the bag. ? Leave the ice on for 20 minutes, 2-3 times a day.  Move your fingers often to avoid stiffness and to lessen swelling.  Raise (elevate) the injured area while you are sitting or lying down. Activity  Return to your normal activities as told by your health care provider. Ask your health   care provider what activities are safe for you.  Avoid activities that cause pain or make your condition worse.  Do not lift anything that is heavier than 10 lb (4.5 kg) until your health care provider approves.  Do exercises as told by your health care provider. General instructions  Take over-the-counter and prescription  medicines only as told by your health care provider.  Do not drive or operate heavy machinery while taking prescription pain medicines.  Keep all follow-up visits as told by your health care provider. This is important. How is this prevented?  Warm up and stretch before being active.  Cool down and stretch after being active.  Give your body time to rest between periods of activity.  Make sure to use equipment that fits you.  Be safe and responsible while being active to avoid falls.  Maintain physical fitness, including strength and flexibility. Contact a health care provider if:  You have symptoms that get worse or do not get better after 2 weeks of treatment.  You develop new symptoms. Get help right away if:  You have severe pain.  You develop pain or numbness in your hand.  Your hand feels unusually cold.  Your fingernails turn a dark color, such as blue or gray. This information is not intended to replace advice given to you by your health care provider. Make sure you discuss any questions you have with your health care provider. Document Released: 03/10/2005 Document Revised: 11/15/2015 Document Reviewed: 02/16/2015 Elsevier Interactive Patient Education  2018 [employer]  

## 2017-12-18 NOTE — Assessment & Plan Note (Signed)
Chronic and uncontrolled When reviewing medications with the patient today, does not seem that she has been taking her hydrochlorothiazide I have sent a new prescription for this to the pharmacy I advised that she follow-up with her PCP on her blood pressure when she is taking all of her medications Advised her to bring all of her medications to every visit

## 2018-01-28 ENCOUNTER — Ambulatory Visit: Payer: Medicare HMO | Admitting: Family Medicine

## 2018-02-02 DIAGNOSIS — H02821 Cysts of right upper eyelid: Secondary | ICD-10-CM | POA: Diagnosis not present

## 2018-02-04 ENCOUNTER — Ambulatory Visit (INDEPENDENT_AMBULATORY_CARE_PROVIDER_SITE_OTHER): Payer: Medicare HMO | Admitting: Family Medicine

## 2018-02-04 ENCOUNTER — Encounter: Payer: Self-pay | Admitting: Family Medicine

## 2018-02-04 VITALS — BP 130/86 | HR 69 | Temp 97.6°F | Wt 170.0 lb

## 2018-02-04 DIAGNOSIS — E890 Postprocedural hypothyroidism: Secondary | ICD-10-CM

## 2018-02-04 DIAGNOSIS — I10 Essential (primary) hypertension: Secondary | ICD-10-CM

## 2018-02-04 DIAGNOSIS — D509 Iron deficiency anemia, unspecified: Secondary | ICD-10-CM

## 2018-02-04 DIAGNOSIS — E782 Mixed hyperlipidemia: Secondary | ICD-10-CM | POA: Diagnosis not present

## 2018-02-04 DIAGNOSIS — Z85038 Personal history of other malignant neoplasm of large intestine: Secondary | ICD-10-CM

## 2018-02-04 DIAGNOSIS — Z87442 Personal history of urinary calculi: Secondary | ICD-10-CM | POA: Diagnosis not present

## 2018-02-04 DIAGNOSIS — K2950 Unspecified chronic gastritis without bleeding: Secondary | ICD-10-CM | POA: Diagnosis not present

## 2018-02-04 DIAGNOSIS — R69 Illness, unspecified: Secondary | ICD-10-CM | POA: Diagnosis not present

## 2018-02-04 DIAGNOSIS — Z23 Encounter for immunization: Secondary | ICD-10-CM | POA: Diagnosis not present

## 2018-02-04 DIAGNOSIS — F3340 Major depressive disorder, recurrent, in remission, unspecified: Secondary | ICD-10-CM

## 2018-02-04 MED ORDER — VENLAFAXINE HCL 37.5 MG PO TABS: 37.5000 mg | ORAL_TABLET | Freq: Two times a day (BID) | ORAL | 3 refills | Status: DC

## 2018-02-04 MED ORDER — ONDANSETRON HCL 4 MG PO TABS: 4.0000 mg | ORAL_TABLET | Freq: Three times a day (TID) | ORAL | 0 refills | Status: DC | PRN

## 2018-02-04 MED ORDER — OMEPRAZOLE 40 MG PO CPDR: 40.0000 mg | DELAYED_RELEASE_CAPSULE | Freq: Every day | ORAL | 3 refills | Status: DC

## 2018-02-04 NOTE — Progress Notes (Signed)
Patient: Melissa Fitzgerald Female    DOB: 07/22/1945   72 y.o.   MRN: [id number] Visit Date: 02/04/2018  Today's Provider: Vernie Murders, PA   Chief Complaint  Patient presents with  . Hypertension   Subjective:    HPI  Hypertension, follow-up:  BP Readings from Last 3 Encounters:  02/04/18 130/86  12/17/17 (!) 148/98  08/12/17 (!) 158/87    She was last seen for hypertension 6 weeks ago.  BP at that visit was 130/86. Management changes since that visit include stopped taking HCTZ medication and was advised to started back taking the medication on 12/17/2017.Patient states she is currently taking the HCTZ now. She reports good compliance with treatment. She is not having side effects.  She is not exercising. She is adherent to low salt diet.   Outside blood pressures are n/a. She is experiencing none.  Patient denies chest pain, chest pressure/discomfort, exertional chest pressure/discomfort, fatigue, irregular heart beat, palpitations, paroxysmal nocturnal dyspnea and tachypnea.   Cardiovascular risk factors include dyslipidemia and hypertension.  Use of agents associated with hypertension: none.     Weight trend: stable Wt Readings from Last 3 Encounters:  02/04/18 170 lb (77.1 kg)  12/17/17 168 lb 6.4 oz (76.4 kg)  08/12/17 170 lb 6.4 oz (77.3 kg)    Current diet: well balanced  Patient was here on 12/17/2017 and BP was 148/98 and Dr.Bacigalupo advised patient to come see primary provider in 6 weeks. -----------------------------------------------------------------------    Past Medical History:  Diagnosis Date  . Anemia   . Colon polyp   . Depression   . Hyperlipidemia   . Hypertension   . Hypothyroidism   . Irritable bowel disease   . Kidney stone   . Thyroid disease    Past Surgical History:  Procedure Laterality Date  . ABDOMINAL HYSTERECTOMY    . BREAST ENHANCEMENT SURGERY    . BREAST SURGERY    . CHOLECYSTECTOMY  2000  . COLECTOMY   09/2013  . COLONOSCOPY  09-13-13   Dr [name]  . COLONOSCOPY WITH PROPOFOL N/A 10/18/2014   Procedure: COLONOSCOPY WITH PROPOFOL;  Surgeon: [name], MD;  Location: [facility] ENDOSCOPY;  Service: Endoscopy;  Laterality: N/A;  . CYSTOSCOPY/URETEROSCOPY/HOLMIUM LASER/STENT PLACEMENT Right 06/23/2017   Procedure: CYSTOSCOPY/URETEROSCOPY/HOLMIUM LASER/STENT PLACEMENT;  Surgeon: Abbie Sons, MD;  Location: [facility] ORS;  Service: Urology;  Laterality: Right;  . ESOPHAGOGASTRODUODENOSCOPY (EGD) WITH PROPOFOL N/A 01/03/2015   Procedure: ESOPHAGOGASTRODUODENOSCOPY (EGD) WITH PROPOFOL;  Surgeon: [name], MD;  Location: [facility] ENDOSCOPY;  Service: Endoscopy;  Laterality: N/A;  . EYE SURGERY     bilateral cataract   . FOOT SURGERY    . KIDNEY STONE SURGERY    . NASAL SINUS SURGERY    . PORT-A-CATH REMOVAL    . PORTACATH PLACEMENT  10/24/13  . THYROID SURGERY    . TONSILLECTOMY     Family History  Problem Relation Age of Onset  . Heart disease Mother   . Diabetes Mother   . Arthritis Sister   . Hyperlipidemia Sister   . COPD Brother   . Diabetes Sister   . Colon cancer Unknown        first cousin  . Breast cancer Maternal Aunt   . Breast cancer Paternal Aunt    Allergies  Allergen Reactions  . Codeine Nausea And Vomiting  . Oxycodone Nausea And Vomiting  . Penicillins Nausea And Vomiting    Has patient had a PCN reaction causing  immediate rash, facial/tongue/throat swelling, SOB or lightheadedness with hypotension: No Has patient had a PCN reaction causing severe rash involving mucus membranes or skin necrosis: No Has patient had a PCN reaction that required hospitalization: No Has patient had a PCN reaction occurring within the last 10 years: No If all of the above answers are "NO", then may proceed with Cephalosporin use.   . Tramadol Nausea And Vomiting  . Trintellix [Vortioxetine] Itching    Current Outpatient Medications:  .  aspirin-acetaminophen-caffeine  (EXCEDRIN MIGRAINE) 250-250-65 MG tablet, Take 2 tablets by mouth 2 (two) times daily as needed for headache or migraine., Disp: 30 tablet, Rfl: 0 .  carboxymethylcellulose (REFRESH PLUS) 0.5 % SOLN, Place 1 drop into both eyes daily as needed (dry eyes). , Disp: , Rfl:  .  escitalopram (LEXAPRO) 10 MG tablet, TAKE 1 TABLET BY MOUTH ONCE DAILY, Disp: 30 tablet, Rfl: 6 .  Ferrous Sulfate 28 MG TABS, Take 28 mg by mouth daily., Disp: , Rfl:  .  hydrochlorothiazide (HYDRODIURIL) 25 MG tablet, Take 1 tablet (25 mg total) by mouth daily., Disp: 90 tablet, Rfl: 3 .  levothyroxine (SYNTHROID, LEVOTHROID) 100 MCG tablet, TAKE ONE TABLET BY MOUTH ONCE DAILY BEFORE BREAKFAST, Disp: 90 tablet, Rfl: 3 .  meloxicam (MOBIC) 15 MG tablet, Take 1 tablet (15 mg total) by mouth daily., Disp: 30 tablet, Rfl: 0 .  Menthol, Topical Analgesic, (BIOFREEZE EX), Apply 1 application topically daily as needed (pain)., Disp: , Rfl:  .  metoprolol succinate (TOPROL-XL) 100 MG 24 hr tablet, Take 1 tablet (100 mg total) by mouth daily. Take with or immediately following a meal., Disp: 90 tablet, Rfl: 3  Review of Systems  Social History   Tobacco Use  . Smoking status: Former Smoker    Packs/day: 0.00    Types: Cigarettes  . Smokeless tobacco: Never Used  . Tobacco comment: still smoking every now and then, rarely one  Substance Use Topics  . Alcohol use: Yes    Alcohol/week: 0.0 standard drinks    Comment: occasionally   Objective:   BP 130/86 (BP Location: Left Arm, Patient Position: Sitting, Cuff Size: Normal)   Pulse 69   Temp 97.6 F (36.4 C) (Oral)   Wt 170 lb (77.1 kg)   SpO2 98%   BMI 27.44 kg/m  Vitals:   02/04/18 1433  BP: 130/86  Pulse: 69  Temp: 97.6 F (36.4 C)  TempSrc: Oral  SpO2: 98%  Weight: 170 lb (77.1 kg)     Physical Exam  Constitutional: She is oriented to person, place, and time. She appears well-developed and well-nourished. No distress.  HENT:  Head: Normocephalic and  atraumatic.  Right Ear: Hearing normal.  Left Ear: Hearing normal.  Nose: Nose normal.  Eyes: Conjunctivae and lids are normal. Right eye exhibits no discharge. Left eye exhibits no discharge. No scleral icterus.  Pulmonary/Chest: Effort normal. No respiratory distress.  Musculoskeletal: Normal range of motion.  Neurological: She is alert and oriented to person, place, and time.  Skin: Skin is intact. No lesion and no rash noted.  Psychiatric: Her speech is normal and behavior is normal. Thought content normal. Her affect is labile.      Assessment & Plan:     1. Essential (primary) hypertension Finally got on the HCTZ 25 mg qd with the Metoprolol Succinate 100 mg qd and BP greatly improved. Check CBC and CMP to assess kidney function, electrolytes and blood cell counts. - CBC with Differential/Platelet - Comprehensive metabolic panel  2. Hypothyroidism, postop Still on Levothyroxine 100 mcg qd but feeling sluggish and sad. Will check labs to assess thyroid function. History of total thyroidectomy in 2013 by Dr. Pryor Ochoa for multiple nodules. - T4 - TSH - CBC with Differential/Platelet - Comprehensive metabolic panel  3. History of renal calculi No gross hematuria noted at home. Some recent back pains. Had a 10 mm obstructing right mid ureteral stone remove ureteroscopically on 06-23-17. Will check labs and may need follow up with urologist (Dr. Bernardo Heater). - CBC with Differential/Platelet - Comprehensive metabolic panel  4. Iron deficiency anemia, unspecified iron deficiency anemia type CBC on 07-25-17 showed improvement in RBC, Hgb and Hct levels with iron supplementation. Has stopped the iron supplement and needs recheck of labs. No pica. - CBC with Differential/Platelet - Iron - Iron and TIBC  5. Mixed hyperlipidemia Trying to follow a low fat diet. Total cholesterol 211, triglycerides 64, HDL 79 with LDL 119 on 07-25-17. Declined statin. Will recheck lipid panel and CMP to assess  exercise and diet plan impact. - Lipid panel - Comprehensive metabolic panel  6. History of colon cancer History of stage III colon cancers/p right hemicolectomyon 09/29/2013. 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 There was lymph-vascular invasion. There was no perineural invasion. Margins were negative. One of 14 lymph nodes were negative. Pathologic stage was [id number]. She received 12 cycles of adjuvant chemotherapy. She received 6 cycles of FOLFOX. FOLFOX was switched to 5FU/LVsecondary to a neuropathy. Normal colonoscopy by Dr. Jamal Collin (surgeon) in 2016. Plans repeat exam in 1-2 years.  7. Recurrent major depressive disorder, in remission ([place]) Some flare in sadness and loss of motivation despite use of the Lexapro 10 mg qd. Using Melatonin and Benadryl to help with sleep disorder. Will check routine labs. Patient is asking to switch to Venlafaxine and switch was made. Recheck pending lab reports. - TSH - CBC with Differential/Platelet - Comprehensive metabolic panel - venlafaxine (EFFEXOR) 37.5 MG tablet; Take 1 tablet (37.5 mg total) by mouth 2 (two) times daily.  Dispense: 60 tablet; Refill: 3  8. Need for influenza vaccination - Flu vaccine HIGH DOSE PF (Fluzone High dose)  9. 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 Jamal Collin (surgeon) in 2016. Having some intermittent return of dyspepsia and nausea. Requests refill of Omeprazole and Zofran for prn use. Recommend she follow up with gastroenterologist if frequently returning symptoms. - omeprazole (PRILOSEC) 40 MG capsule; Take 1 capsule (40 mg total) by mouth daily.  Dispense: 30 capsule; Refill: 3 - ondansetron (ZOFRAN) 4 MG tablet; Take 1 tablet (4 mg total) by mouth every 8 (eight) hours as needed for nausea or vomiting.  Dispense: 60 tablet; Refill: [place], [place]  [facility]

## 2018-02-05 DIAGNOSIS — E782 Mixed hyperlipidemia: Secondary | ICD-10-CM | POA: Diagnosis not present

## 2018-02-05 DIAGNOSIS — H02823 Cysts of right eye, unspecified eyelid: Secondary | ICD-10-CM | POA: Diagnosis not present

## 2018-02-05 DIAGNOSIS — I10 Essential (primary) hypertension: Secondary | ICD-10-CM | POA: Diagnosis not present

## 2018-02-05 DIAGNOSIS — R69 Illness, unspecified: Secondary | ICD-10-CM | POA: Diagnosis not present

## 2018-02-05 DIAGNOSIS — E890 Postprocedural hypothyroidism: Secondary | ICD-10-CM | POA: Diagnosis not present

## 2018-02-05 DIAGNOSIS — Z87442 Personal history of urinary calculi: Secondary | ICD-10-CM | POA: Diagnosis not present

## 2018-02-05 DIAGNOSIS — D509 Iron deficiency anemia, unspecified: Secondary | ICD-10-CM | POA: Diagnosis not present

## 2018-02-06 LAB — CBC WITH DIFFERENTIAL/PLATELET
Basophils Absolute: 0 10*3/uL (ref 0.0–0.2)
Basos: 1 %
EOS (ABSOLUTE): 0.1 10*3/uL (ref 0.0–0.4)
Eos: 1 %
Hematocrit: 32.8 % — ABNORMAL LOW (ref 34.0–46.6)
Hemoglobin: 10.5 g/dL — ABNORMAL LOW (ref 11.1–15.9)
Immature Grans (Abs): 0 10*3/uL (ref 0.0–0.1)
Immature Granulocytes: 0 %
Lymphocytes Absolute: 1 10*3/uL (ref 0.7–3.1)
Lymphs: 21 %
MCH: 29.2 pg (ref 26.6–33.0)
MCHC: 32 g/dL (ref 31.5–35.7)
MCV: 91 fL (ref 79–97)
Monocytes Absolute: 0.4 10*3/uL (ref 0.1–0.9)
Monocytes: 8 %
Neutrophils Absolute: 3.2 10*3/uL (ref 1.4–7.0)
Neutrophils: 69 %
Platelets: 224 10*3/uL (ref 150–450)
RBC: 3.6 x10E6/uL — ABNORMAL LOW (ref 3.77–5.28)
RDW: 12.5 % (ref 12.3–15.4)
WBC: 4.6 10*3/uL (ref 3.4–10.8)

## 2018-02-06 LAB — COMPREHENSIVE METABOLIC PANEL
ALT: 18 IU/L (ref 0–32)
AST: 26 IU/L (ref 0–40)
Albumin/Globulin Ratio: 1.3 (ref 1.2–2.2)
Albumin: 4.3 g/dL (ref 3.5–4.8)
Alkaline Phosphatase: 68 IU/L (ref 39–117)
BUN/Creatinine Ratio: 23 (ref 12–28)
BUN: 24 mg/dL (ref 8–27)
Bilirubin Total: 0.3 mg/dL (ref 0.0–1.2)
CO2: 27 mmol/L (ref 20–29)
Calcium: 9.3 mg/dL (ref 8.7–10.3)
Chloride: 101 mmol/L (ref 96–106)
Creatinine, Ser: 1.06 mg/dL — ABNORMAL HIGH (ref 0.57–1.00)
GFR calc Af Amer: 61 mL/min/{1.73_m2} (ref >59)
GFR calc non Af Amer: 53 mL/min/{1.73_m2} — ABNORMAL LOW (ref >59)
Globulin, Total: 3.2 g/dL (ref 1.5–4.5)
Glucose: 90 mg/dL (ref 65–99)
Potassium: 3.9 mmol/L (ref 3.5–5.2)
Sodium: 142 mmol/L (ref 134–144)
Total Protein: 7.5 g/dL (ref 6.0–8.5)

## 2018-02-06 LAB — LIPID PANEL
Chol/HDL Ratio: 3.4 ratio (ref 0.0–4.4)
Cholesterol, Total: 226 mg/dL — ABNORMAL HIGH (ref 100–199)
HDL: 66 mg/dL (ref >39)
LDL Calculated: 139 mg/dL — ABNORMAL HIGH (ref 0–99)
Triglycerides: 106 mg/dL (ref 0–149)
VLDL Cholesterol Cal: 21 mg/dL (ref 5–40)

## 2018-02-06 LAB — T4: T4, Total: 7.5 ug/dL (ref 4.5–12.0)

## 2018-02-06 LAB — IRON AND TIBC
Iron Saturation: 21 % (ref 15–55)
Iron: 65 ug/dL (ref 27–139)
Total Iron Binding Capacity: 303 ug/dL (ref 250–450)
UIBC: 238 ug/dL (ref 118–369)

## 2018-02-06 LAB — TSH: TSH: 0.502 u[IU]/mL (ref 0.450–4.500)

## 2018-02-08 ENCOUNTER — Telehealth: Payer: Self-pay

## 2018-02-08 NOTE — Telephone Encounter (Signed)
Patient advised of lab results. Patient verbalizes understanding and is in agreement with treatment plan.

## 2018-02-09 ENCOUNTER — Telehealth: Payer: Self-pay

## 2018-02-09 NOTE — Telephone Encounter (Signed)
Patient was concerned about taking her anxiety medication Venlafaxine 37.5 mg tablets and states that she does not want it to effect her high BP. Patient states that she going started taking the medication tomorrow and let you know how she is feeling.

## 2018-02-09 NOTE — Telephone Encounter (Signed)
-----   Message from [name], [place] sent at 02/08/2018  8:03 AM EST ----- Thyroid normal. Continue the Levothyroxine 100 mcg qd. Hgb and Hct slowly improving with normal iron levels. Total cholesterol and LDL still above goals. May try Red Yeast Rice qd or Omega-3 with Co-Q 10 qd with low fat diet and exercise 30 minutes 4 days a week. Appointment to check progress in 3 months.

## 2018-02-09 NOTE — Telephone Encounter (Signed)
She could monitor her BP by taking it at home or the nearest pharmacy.

## 2018-02-10 NOTE — Telephone Encounter (Signed)
Patient was advise via voicemail  per DPR.

## 2018-02-23 DIAGNOSIS — Z85038 Personal history of other malignant neoplasm of large intestine: Secondary | ICD-10-CM | POA: Diagnosis not present

## 2018-02-23 DIAGNOSIS — I10 Essential (primary) hypertension: Secondary | ICD-10-CM | POA: Diagnosis not present

## 2018-02-23 DIAGNOSIS — R69 Illness, unspecified: Secondary | ICD-10-CM | POA: Diagnosis not present

## 2018-02-23 DIAGNOSIS — Z8249 Family history of ischemic heart disease and other diseases of the circulatory system: Secondary | ICD-10-CM | POA: Diagnosis not present

## 2018-02-23 DIAGNOSIS — E890 Postprocedural hypothyroidism: Secondary | ICD-10-CM | POA: Diagnosis not present

## 2018-02-23 DIAGNOSIS — Z87891 Personal history of nicotine dependence: Secondary | ICD-10-CM | POA: Diagnosis not present

## 2018-02-23 DIAGNOSIS — Z809 Family history of malignant neoplasm, unspecified: Secondary | ICD-10-CM | POA: Diagnosis not present

## 2018-02-23 DIAGNOSIS — Z833 Family history of diabetes mellitus: Secondary | ICD-10-CM | POA: Diagnosis not present

## 2018-02-23 DIAGNOSIS — Z803 Family history of malignant neoplasm of breast: Secondary | ICD-10-CM | POA: Diagnosis not present

## 2018-03-10 DIAGNOSIS — H02823 Cysts of right eye, unspecified eyelid: Secondary | ICD-10-CM | POA: Diagnosis not present

## 2018-05-08 ENCOUNTER — Other Ambulatory Visit: Payer: Self-pay | Admitting: Family Medicine

## 2018-05-08 DIAGNOSIS — K2950 Unspecified chronic gastritis without bleeding: Secondary | ICD-10-CM

## 2018-05-10 NOTE — Progress Notes (Deleted)
Patient: Melissa Fitzgerald Female    DOB: 07/19/1945   73 y.o.   MRN: [id number] Visit Date: 05/10/2018  Today's Provider: Vernie Murders, PA   No chief complaint on file.  Subjective:     HPI    Hypertension, follow-up:  BP Readings from Last 3 Encounters:  02/04/18 130/86  12/17/17 (!) 148/98  08/12/17 (!) 158/87    She was last seen for hypertension 3 months ago.  BP at that visit was 130/86. Management since that visit includes; labs checked, no changes.She reports {excellent/good/fair/poor:19665} compliance with treatment. She {ACTION; IS/IS WOE:32122482} having side effects. *** She {is/is not:9024} exercising. She {is/is not:9024} adherent to low salt diet.   Outside blood pressures are ***. She is experiencing {Symptoms; cardiac:12860}.  Patient denies {Symptoms; cardiac:12860}.   Cardiovascular risk factors include {cv risk factors:510}.  Use of agents associated with hypertension: {bp agents assoc with hypertension:511::"none"}.   ------------------------------------------------------------------------    Lipid/Cholesterol, Follow-up:   Last seen for this 3 months ago.  Management since that visit includes; labs checked, advised diet and exercise.  Last Lipid Panel:    Component Value Date/Time   CHOL 226 (H) 02/05/2018 1009   TRIG 106 02/05/2018 1009   HDL 66 02/05/2018 1009   CHOLHDL 3.4 02/05/2018 1009   LDLCALC 139 (H) 02/05/2018 1009    She reports {excellent/good/fair/poor:19665} compliance with treatment. She {ACTION; IS/IS NOI:37048889} having side effects. ***  Wt Readings from Last 3 Encounters:  02/04/18 170 lb (77.1 kg)  12/17/17 168 lb 6.4 oz (76.4 kg)  08/12/17 170 lb 6.4 oz (77.3 kg)    ------------------------------------------------------------------------  Hypothyroidism, postop From 02/04/2018-labs checked, no changes.   Iron deficiency anemia, unspecified iron deficiency anemia type From 02/04/2018-labs checked,  no changes.  Recurrent major depressive disorder, in remission ([place]) From 02/04/2018-switched to Venlafaxine 37.5 MG tablet bid.  Other chronic gastritis without hemorrhage From 02/04/2018-Recommended she follow up with gastroenterologist if frequently returning symptoms.   Allergies  Allergen Reactions  . Codeine Nausea And Vomiting  . Oxycodone Nausea And Vomiting  . Penicillins Nausea And Vomiting    Has patient had a PCN reaction causing immediate rash, facial/tongue/throat swelling, SOB or lightheadedness with hypotension: No Has patient had a PCN reaction causing severe rash involving mucus membranes or skin necrosis: No Has patient had a PCN reaction that required hospitalization: No Has patient had a PCN reaction occurring within the last 10 years: No If all of the above answers are "NO", then may proceed with Cephalosporin use.   . Tramadol Nausea And Vomiting  . Trintellix [Vortioxetine] Itching     Current Outpatient Medications:  .  aspirin-acetaminophen-caffeine (EXCEDRIN MIGRAINE) 250-250-65 MG tablet, Take 2 tablets by mouth 2 (two) times daily as needed for headache or migraine., Disp: 30 tablet, Rfl: 0 .  carboxymethylcellulose (REFRESH PLUS) 0.5 % SOLN, Place 1 drop into both eyes daily as needed (dry eyes). , Disp: , Rfl:  .  Ferrous Sulfate 28 MG TABS, Take 28 mg by mouth daily., Disp: , Rfl:  .  hydrochlorothiazide (HYDRODIURIL) 25 MG tablet, Take 1 tablet (25 mg total) by mouth daily., Disp: 90 tablet, Rfl: 3 .  levothyroxine (SYNTHROID, LEVOTHROID) 100 MCG tablet, TAKE ONE TABLET BY MOUTH ONCE DAILY BEFORE BREAKFAST, Disp: 90 tablet, Rfl: 3 .  meloxicam (MOBIC) 15 MG tablet, Take 1 tablet (15 mg total) by mouth daily., Disp: 30 tablet, Rfl: 0 .  Menthol, Topical Analgesic, (BIOFREEZE EX), Apply 1 application topically daily  as needed (pain)., Disp: , Rfl:  .  metoprolol succinate (TOPROL-XL) 100 MG 24 hr tablet, Take 1 tablet (100 mg total) by mouth daily.  Take with or immediately following a meal., Disp: 90 tablet, Rfl: 3 .  omeprazole (PRILOSEC) 40 MG capsule, TAKE 1 CAPSULE BY MOUTH ONCE DAILY, Disp: 90 capsule, Rfl: 0 .  ondansetron (ZOFRAN) 4 MG tablet, Take 1 tablet (4 mg total) by mouth every 8 (eight) hours as needed for nausea or vomiting., Disp: 60 tablet, Rfl: 0 .  venlafaxine (EFFEXOR) 37.5 MG tablet, Take 1 tablet (37.5 mg total) by mouth 2 (two) times daily., Disp: 60 tablet, Rfl: 3  Review of Systems  Constitutional: Negative for appetite change, chills, fatigue and fever.  Respiratory: Negative for chest tightness and shortness of breath.   Cardiovascular: Negative for chest pain and palpitations.  Gastrointestinal: Negative for abdominal pain, nausea and vomiting.  Neurological: Negative for dizziness and weakness.    Social History   Tobacco Use  . Smoking status: Former Smoker    Packs/day: 0.00    Types: Cigarettes  . Smokeless tobacco: Never Used  . Tobacco comment: still smoking every now and then, rarely one  Substance Use Topics  . Alcohol use: Yes    Alcohol/week: 0.0 standard drinks    Comment: occasionally      Objective:   There were no vitals taken for this visit. There were no vitals filed for this visit.   Physical Exam      Assessment & Plan        Vernie Murders, PA  [facility]

## 2018-05-11 ENCOUNTER — Ambulatory Visit: Payer: Self-pay | Admitting: Family Medicine

## 2018-05-13 ENCOUNTER — Ambulatory Visit: Payer: Self-pay | Admitting: Family Medicine

## 2018-05-31 ENCOUNTER — Ambulatory Visit (INDEPENDENT_AMBULATORY_CARE_PROVIDER_SITE_OTHER): Payer: Medicare HMO | Admitting: Family Medicine

## 2018-05-31 ENCOUNTER — Encounter: Payer: Self-pay | Admitting: Family Medicine

## 2018-05-31 VITALS — BP 102/80 | HR 79 | Temp 98.0°F | Resp 16 | Wt 179.0 lb

## 2018-05-31 DIAGNOSIS — E782 Mixed hyperlipidemia: Secondary | ICD-10-CM | POA: Diagnosis not present

## 2018-05-31 DIAGNOSIS — G8929 Other chronic pain: Secondary | ICD-10-CM

## 2018-05-31 DIAGNOSIS — R51 Headache: Secondary | ICD-10-CM | POA: Diagnosis not present

## 2018-05-31 DIAGNOSIS — G479 Sleep disorder, unspecified: Secondary | ICD-10-CM

## 2018-05-31 DIAGNOSIS — I10 Essential (primary) hypertension: Secondary | ICD-10-CM | POA: Diagnosis not present

## 2018-05-31 DIAGNOSIS — R519 Headache, unspecified: Secondary | ICD-10-CM

## 2018-05-31 DIAGNOSIS — D649 Anemia, unspecified: Secondary | ICD-10-CM

## 2018-05-31 NOTE — Progress Notes (Signed)
Patient: Melissa Fitzgerald Female    DOB: September 17, 1945   73 y.o.   MRN: [id number] Visit Date: 05/31/2018  Today's Provider: Vernie Murders, PA   Chief Complaint  Patient presents with  . Follow-up  . Hypertension  . Hyperlipidemia  . Hypothyroidism  . Anemia   Subjective:     HPI   Hypertension, follow-up:  BP Readings from Last 3 Encounters:  05/31/18 102/80  02/04/18 130/86  12/17/17 (!) 148/98    She was last seen for hypertension 4 months ago.  BP at that visit was 130/88. Management since that visit includes; no changes.She reports good compliance with treatment. She is not having side effects. none She is exercising. She is adherent to low salt diet.   Outside blood pressures are runs high. She is experiencing none.  Patient denies none.   Cardiovascular risk factors include advanced age (older than 27 for men, 57 for women).  Use of agents associated with hypertension: none.   -------------------------------------------------------------------     Lipid/Cholesterol, Follow-up:   Last seen for this 4 months ago.  Management since that visit includes; labs checked. Advised diet and exercise. Also recommended Red Yeast Rice or Omega 3 with Co-Q 10.  Last Lipid Panel:    Component Value Date/Time   CHOL 226 (H) 02/05/2018 1009   TRIG 106 02/05/2018 1009   HDL 66 02/05/2018 1009   CHOLHDL 3.4 02/05/2018 1009   LDLCALC 139 (H) 02/05/2018 1009    She reports good compliance with treatment. She is not having side effects. none  Wt Readings from Last 3 Encounters:  05/31/18 179 lb (81.2 kg)  02/04/18 170 lb (77.1 kg)  12/17/17 168 lb 6.4 oz (76.4 kg)    -------------------------------------------------------------------   Hypothyroidism, postop From 02/04/2018-labs checked, no changes.   Iron deficiency anemia, unspecified iron deficiency anemia type From 02/04/2018-labs checked, no changes.   Recurrent major depressive disorder, in  remission ([place]) From 02/04/2018-switched to Venlafaxine.  Past Medical History:  Diagnosis Date  . Anemia   . Colon polyp   . Depression   . Hyperlipidemia   . Hypertension   . Hypothyroidism   . Irritable bowel disease   . Kidney stone   . Thyroid disease    Family History  Problem Relation Age of Onset  . Heart disease Mother   . Diabetes Mother   . Arthritis Sister   . Hyperlipidemia Sister   . COPD Brother   . Diabetes Sister   . Colon cancer Other        first cousin  . Breast cancer Maternal Aunt   . Breast cancer Paternal Aunt    Past Surgical History:  Procedure Laterality Date  . ABDOMINAL HYSTERECTOMY    . BREAST ENHANCEMENT SURGERY    . BREAST SURGERY    . CHOLECYSTECTOMY  2000  . COLECTOMY  09/2013  . COLONOSCOPY  09-13-13   Dr [name]  . COLONOSCOPY WITH PROPOFOL N/A 10/18/2014   Procedure: COLONOSCOPY WITH PROPOFOL;  Surgeon: [name], MD;  Location: [facility] ENDOSCOPY;  Service: Endoscopy;  Laterality: N/A;  . CYSTOSCOPY/URETEROSCOPY/HOLMIUM LASER/STENT PLACEMENT Right 06/23/2017   Procedure: CYSTOSCOPY/URETEROSCOPY/HOLMIUM LASER/STENT PLACEMENT;  Surgeon: Abbie Sons, MD;  Location: [facility] ORS;  Service: Urology;  Laterality: Right;  . ESOPHAGOGASTRODUODENOSCOPY (EGD) WITH PROPOFOL N/A 01/03/2015   Procedure: ESOPHAGOGASTRODUODENOSCOPY (EGD) WITH PROPOFOL;  Surgeon: [name], MD;  Location: [facility] ENDOSCOPY;  Service: Endoscopy;  Laterality: N/A;  . EYE SURGERY  bilateral cataract   . FOOT SURGERY    . KIDNEY STONE SURGERY    . NASAL SINUS SURGERY    . PORT-A-CATH REMOVAL    . PORTACATH PLACEMENT  10/24/13  . THYROID SURGERY    . TONSILLECTOMY     Allergies  Allergen Reactions  . Codeine Nausea And Vomiting  . Oxycodone Nausea And Vomiting  . Penicillins Nausea And Vomiting    Has patient had a PCN reaction causing immediate rash, facial/tongue/throat swelling, SOB or lightheadedness with hypotension: No Has patient had a  PCN reaction causing severe rash involving mucus membranes or skin necrosis: No Has patient had a PCN reaction that required hospitalization: No Has patient had a PCN reaction occurring within the last 10 years: No If all of the above answers are "NO", then may proceed with Cephalosporin use.   . Tramadol Nausea And Vomiting  . Trintellix [Vortioxetine] Itching    Current Outpatient Medications:  .  aspirin-acetaminophen-caffeine (EXCEDRIN MIGRAINE) 250-250-65 MG tablet, Take 2 tablets by mouth 2 (two) times daily as needed for headache or migraine., Disp: 30 tablet, Rfl: 0 .  carboxymethylcellulose (REFRESH PLUS) 0.5 % SOLN, Place 1 drop into both eyes daily as needed (dry eyes). , Disp: , Rfl:  .  Ferrous Sulfate 28 MG TABS, Take 28 mg by mouth daily., Disp: , Rfl:  .  hydrochlorothiazide (HYDRODIURIL) 25 MG tablet, Take 1 tablet (25 mg total) by mouth daily., Disp: 90 tablet, Rfl: 3 .  levothyroxine (SYNTHROID, LEVOTHROID) 100 MCG tablet, TAKE ONE TABLET BY MOUTH ONCE DAILY BEFORE BREAKFAST, Disp: 90 tablet, Rfl: 3 .  Menthol, Topical Analgesic, (BIOFREEZE EX), Apply 1 application topically daily as needed (pain)., Disp: , Rfl:  .  metoprolol succinate (TOPROL-XL) 100 MG 24 hr tablet, Take 1 tablet (100 mg total) by mouth daily. Take with or immediately following a meal., Disp: 90 tablet, Rfl: 3 .  omeprazole (PRILOSEC) 40 MG capsule, TAKE 1 CAPSULE BY MOUTH ONCE DAILY, Disp: 90 capsule, Rfl: 0 .  ondansetron (ZOFRAN) 4 MG tablet, Take 1 tablet (4 mg total) by mouth every 8 (eight) hours as needed for nausea or vomiting., Disp: 60 tablet, Rfl: 0 .  meloxicam (MOBIC) 15 MG tablet, Take 1 tablet (15 mg total) by mouth daily. (Patient not taking: Reported on 05/31/2018), Disp: 30 tablet, Rfl: 0 .  venlafaxine (EFFEXOR) 37.5 MG tablet, Take 1 tablet (37.5 mg total) by mouth 2 (two) times daily. (Patient not taking: Reported on 05/31/2018), Disp: 60 tablet, Rfl: 3  Review of Systems  Constitutional:  Negative for appetite change, chills, fatigue and fever.  Respiratory: Negative for chest tightness and shortness of breath.   Cardiovascular: Negative for chest pain and palpitations.  Gastrointestinal: Negative for abdominal pain, nausea and vomiting.  Neurological: Negative for dizziness and weakness.   Social History   Tobacco Use  . Smoking status: Former Smoker    Packs/day: 0.00    Types: Cigarettes  . Smokeless tobacco: Never Used  . Tobacco comment: still smoking every now and then, rarely one  Substance Use Topics  . Alcohol use: Yes    Alcohol/week: 0.0 standard drinks    Comment: occasionally     Objective:   BP 102/80 (BP Location: Right Arm, Patient Position: Sitting, Cuff Size: Large)   Pulse 79   Temp 98 F (36.7 C) (Oral)   Resp 16   Wt 179 lb (81.2 kg)   SpO2 99%   BMI 28.89 kg/m  Vitals:   05/31/18  1436  BP: 102/80  Pulse: 79  Resp: 16  Temp: 98 F (36.7 C)  TempSrc: Oral  SpO2: 99%  Weight: 179 lb (81.2 kg)   Physical Exam Constitutional:      General: She is not in acute distress.    Appearance: She is well-developed.  HENT:     Head: Normocephalic and atraumatic.     Right Ear: Hearing and tympanic membrane normal.     Left Ear: Hearing and tympanic membrane normal.     Nose: Nose normal.     Mouth/Throat:     Pharynx: Oropharynx is clear.  Eyes:     General: Lids are normal. No scleral icterus.       Right eye: No discharge.        Left eye: No discharge.     Conjunctiva/sclera: Conjunctivae normal.  Neck:     Musculoskeletal: Normal range of motion and neck supple.  Cardiovascular:     Rate and Rhythm: Normal rate and regular rhythm.     Heart sounds: Normal heart sounds.  Pulmonary:     Effort: Pulmonary effort is normal. No respiratory distress.     Breath sounds: Normal breath sounds.  Abdominal:     General: Bowel sounds are normal.     Palpations: Abdomen is soft.  Musculoskeletal: Normal range of motion.  Skin:     Findings: No lesion or rash.  Neurological:     Mental Status: She is alert and oriented to person, place, and time.  Psychiatric:        Speech: Speech normal.        Behavior: Behavior normal.        Thought Content: Thought content normal.       Assessment & Plan    1. Essential (primary) hypertension Well controlled BP. Still taking the HCTZ 25 mg qd and Metoprolol succinate 100 mg qd. No chest pains, palpitations, dyspnea or edema. Continue present medications and get routine follow up labs. Recheck pending lab reports. - CBC with Differential/Platelet - Comprehensive metabolic panel - Lipid panel - TSH  2. Mixed hyperlipidemia Trying to follow a low fat diet. Has gained 9 lbs since 02-04-18. Recommend regular exercise, adding Benefiber and extra water intake to diet. Recheck CMP and Lipid Panel. - Comprehensive metabolic panel - Lipid panel  3. Sleep disturbance Sleeping better with use of Benadryl but has hangover sensation in the mornings. Recommend she take the Benadryl before 10 pm to limit hangover sensation.  4. Chronic nonintractable headache, unspecified headache type Improving occurrences of generalized headache. No nausea or vomiting. No neurologic deficit. May use Ibuprofen or Excedrin. Check CBC and CMP. Rarely uses the Fioricet for suspected tension headache component. - CBC with Differential/Platelet - Comprehensive metabolic panel  5. Anemia, unspecified type No dizziness or faint sensation. Continue vitamin with iron. Recheck CBC and TSH. - CBC with Differential/Platelet - TSH     Vernie Murders, PA  [facility]

## 2018-06-02 DIAGNOSIS — E782 Mixed hyperlipidemia: Secondary | ICD-10-CM | POA: Diagnosis not present

## 2018-06-02 DIAGNOSIS — R51 Headache: Secondary | ICD-10-CM | POA: Diagnosis not present

## 2018-06-02 DIAGNOSIS — I10 Essential (primary) hypertension: Secondary | ICD-10-CM | POA: Diagnosis not present

## 2018-06-02 DIAGNOSIS — D649 Anemia, unspecified: Secondary | ICD-10-CM | POA: Diagnosis not present

## 2018-06-03 ENCOUNTER — Telehealth: Payer: Self-pay | Admitting: *Deleted

## 2018-06-03 LAB — LIPID PANEL
Chol/HDL Ratio: 3.1 ratio (ref 0.0–4.4)
Cholesterol, Total: 213 mg/dL — ABNORMAL HIGH (ref 100–199)
HDL: 68 mg/dL (ref >39)
LDL Calculated: 128 mg/dL — ABNORMAL HIGH (ref 0–99)
Triglycerides: 87 mg/dL (ref 0–149)
VLDL Cholesterol Cal: 17 mg/dL (ref 5–40)

## 2018-06-03 LAB — COMPREHENSIVE METABOLIC PANEL
ALT: 10 IU/L (ref 0–32)
AST: 18 IU/L (ref 0–40)
Albumin/Globulin Ratio: 1.1 — ABNORMAL LOW (ref 1.2–2.2)
Albumin: 4.1 g/dL (ref 3.7–4.7)
Alkaline Phosphatase: 67 IU/L (ref 39–117)
BUN/Creatinine Ratio: 22 (ref 12–28)
BUN: 24 mg/dL (ref 8–27)
Bilirubin Total: 0.3 mg/dL (ref 0.0–1.2)
CO2: 22 mmol/L (ref 20–29)
Calcium: 9.3 mg/dL (ref 8.7–10.3)
Chloride: 102 mmol/L (ref 96–106)
Creatinine, Ser: 1.07 mg/dL — ABNORMAL HIGH (ref 0.57–1.00)
GFR calc Af Amer: 60 mL/min/{1.73_m2} (ref >59)
GFR calc non Af Amer: 52 mL/min/{1.73_m2} — ABNORMAL LOW (ref >59)
Globulin, Total: 3.6 g/dL (ref 1.5–4.5)
Glucose: 88 mg/dL (ref 65–99)
Potassium: 3.9 mmol/L (ref 3.5–5.2)
Sodium: 144 mmol/L (ref 134–144)
Total Protein: 7.7 g/dL (ref 6.0–8.5)

## 2018-06-03 LAB — CBC WITH DIFFERENTIAL/PLATELET
Basophils Absolute: 0 10*3/uL (ref 0.0–0.2)
Basos: 1 %
EOS (ABSOLUTE): 0.1 10*3/uL (ref 0.0–0.4)
Eos: 1 %
Hematocrit: 30.2 % — ABNORMAL LOW (ref 34.0–46.6)
Hemoglobin: 10.1 g/dL — ABNORMAL LOW (ref 11.1–15.9)
Immature Grans (Abs): 0 10*3/uL (ref 0.0–0.1)
Immature Granulocytes: 0 %
Lymphocytes Absolute: 1 10*3/uL (ref 0.7–3.1)
Lymphs: 18 %
MCH: 30.9 pg (ref 26.6–33.0)
MCHC: 33.4 g/dL (ref 31.5–35.7)
MCV: 92 fL (ref 79–97)
Monocytes Absolute: 0.4 10*3/uL (ref 0.1–0.9)
Monocytes: 6 %
Neutrophils Absolute: 4.1 10*3/uL (ref 1.4–7.0)
Neutrophils: 74 %
Platelets: 225 10*3/uL (ref 150–450)
RBC: 3.27 x10E6/uL — ABNORMAL LOW (ref 3.77–5.28)
RDW: 12.4 % (ref 11.7–15.4)
WBC: 5.5 10*3/uL (ref 3.4–10.8)

## 2018-06-03 LAB — TSH: TSH: 0.641 u[IU]/mL (ref 0.450–4.500)

## 2018-06-03 NOTE — Telephone Encounter (Signed)
Should help any menopausal symptoms. Thanks.

## 2018-06-03 NOTE — Telephone Encounter (Signed)
Left message for pt to return call.

## 2018-06-03 NOTE — Telephone Encounter (Signed)
-----   Message from [name], [place] sent at 06/03/2018 10:17 AM EDT ----- No drop in hemoglobin from her usual levels. LDL cholesterol still above the goal of <100. Continue multivitamin with iron daily. If sleep pattern not better with switching Benadryl taken before 10:00 pm, may have to try Trazodone to help with sleep and mood disorder.

## 2018-06-03 NOTE — Telephone Encounter (Signed)
Patient was notified of results. Expressed understanding. Patient wanted Simona Huh to know she is now taking [place] for menopause support.

## 2018-06-08 ENCOUNTER — Other Ambulatory Visit: Payer: Self-pay | Admitting: Family Medicine

## 2018-06-08 DIAGNOSIS — G4489 Other headache syndrome: Secondary | ICD-10-CM

## 2018-07-26 ENCOUNTER — Ambulatory Visit: Payer: Self-pay

## 2018-08-09 IMAGING — CT CT HEAD W/O CM
3 series · 16 of 47 positions shown, 19 images · non-contrast
Comparison: Brain MR dated 12/04/2011 and head CT dated 12/25/2006.

CLINICAL DATA: Headache and hypertension.

EXAM:
CT HEAD WITHOUT CONTRAST
TECHNIQUE: Contiguous axial images were obtained from the base of the skull
through the vertex without intravenous contrast.

[Series 2: head wo · axial · 0.43mm/px · z∈[-155,-25]mm · 10 of 32 slices shown, 13 images]
[im 3/32  brain]
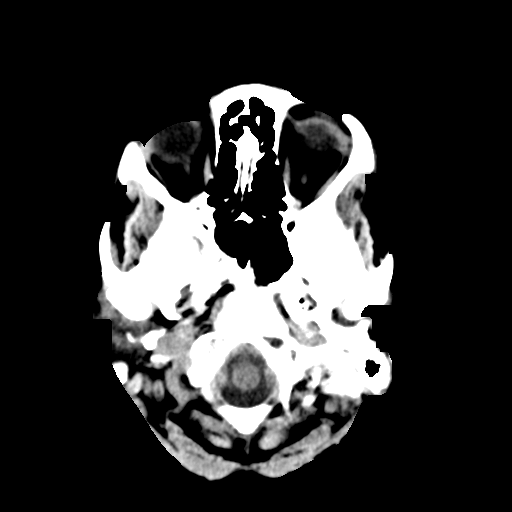
[im 3/32  bone]
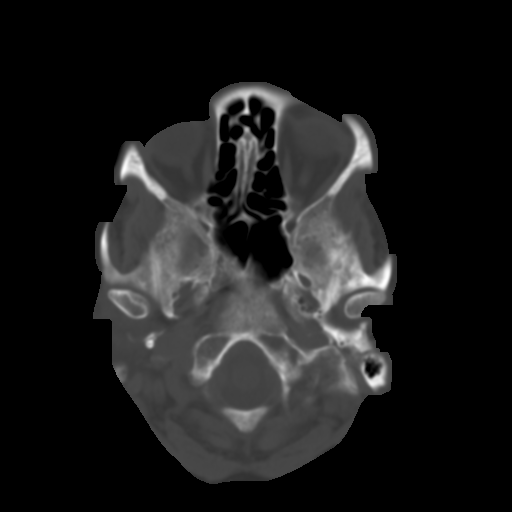
[im 6/32  brain]
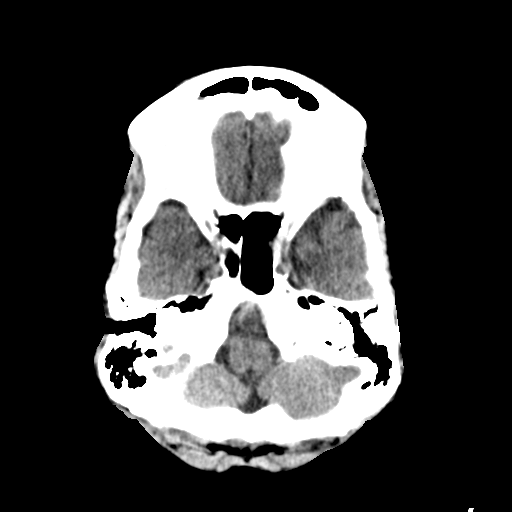
[im 9/32  brain]
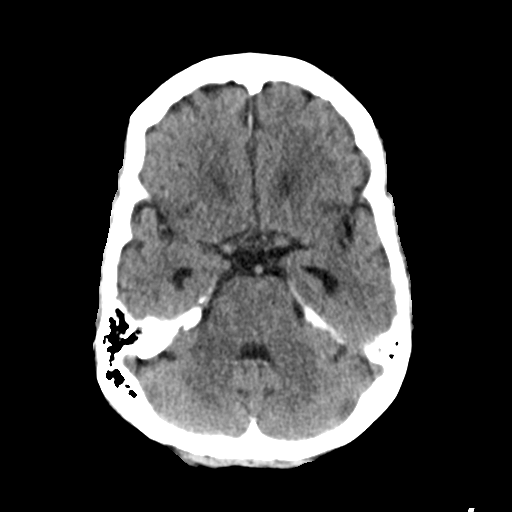
[im 11/32  brain]
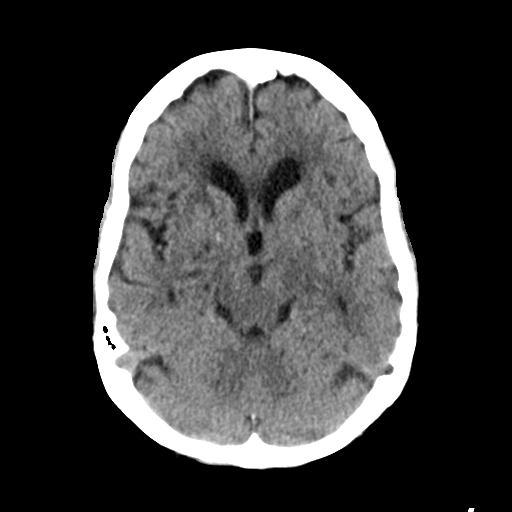
[im 14/32  brain]
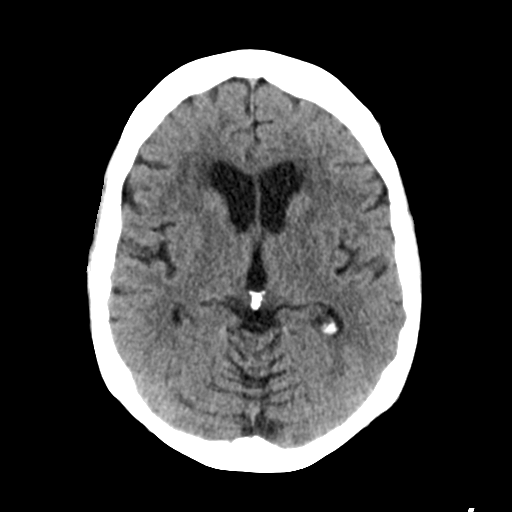
[im 14/32  bone]
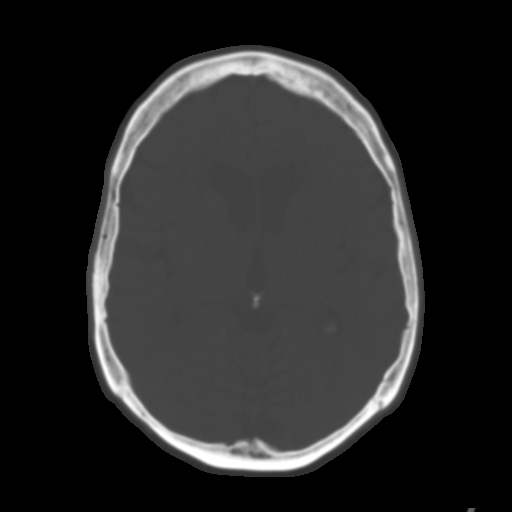
[im 18/32  brain]
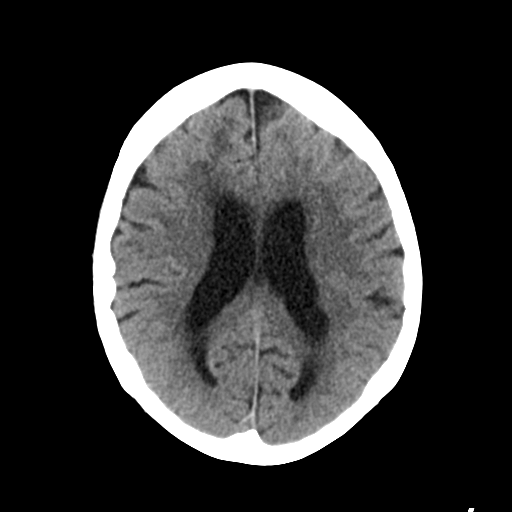
[im 21/32  brain]
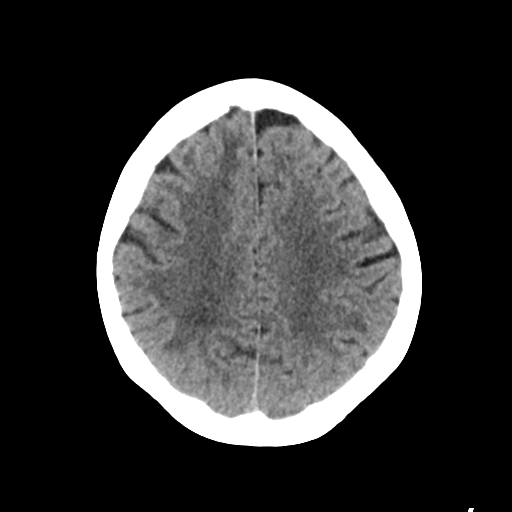
[im 24/32  brain]
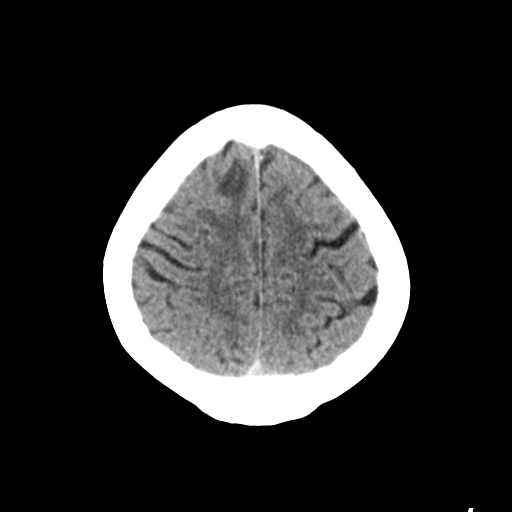
[im 26/32  brain]
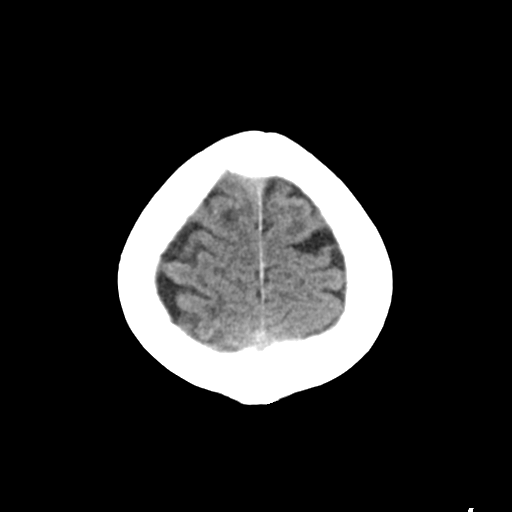
[im 26/32  bone]
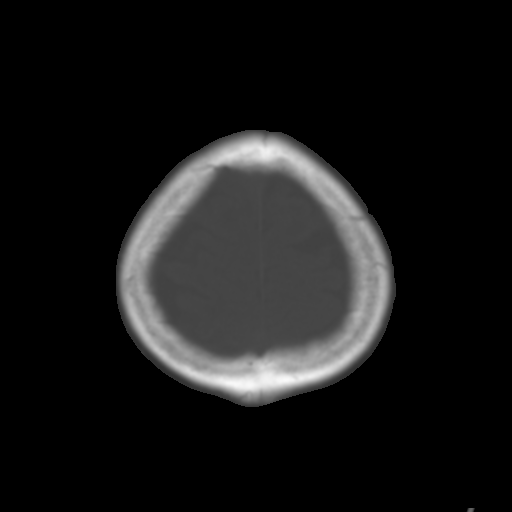
[im 29/32  brain]
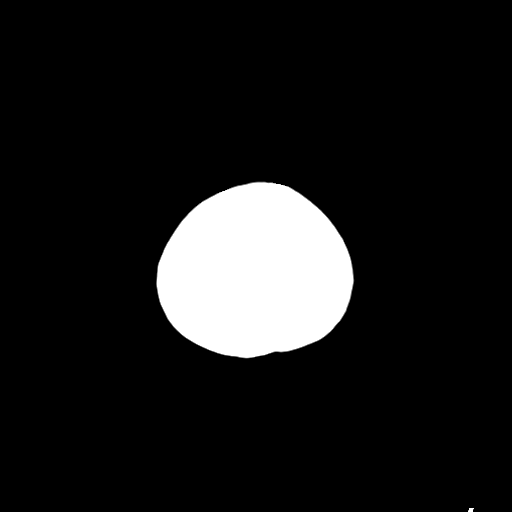

[Series 4: coronal soft tissue · coronal · 0.35mm/px · 3 of 65 slices shown]
[im 22/65  brain]
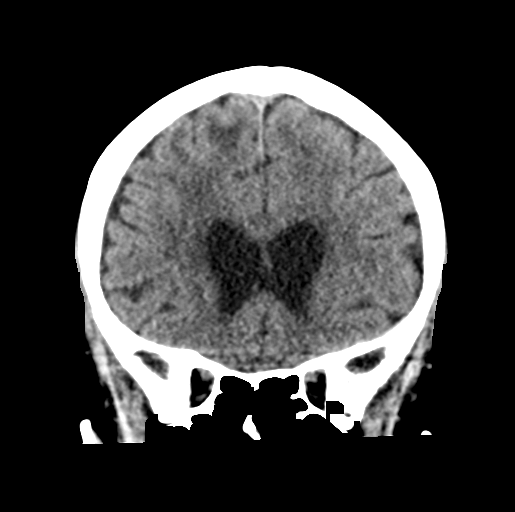
[im 29/65  brain]
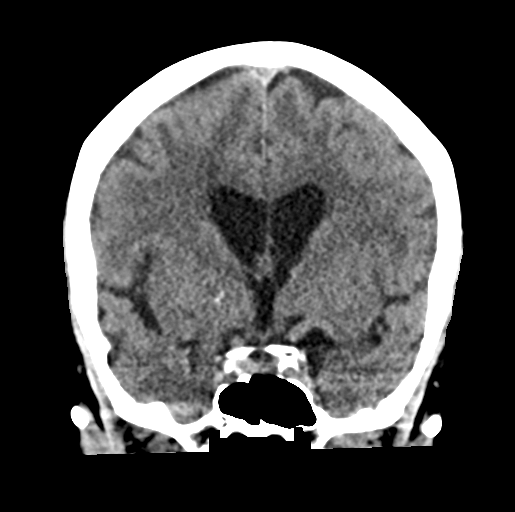
[im 36/65  brain]
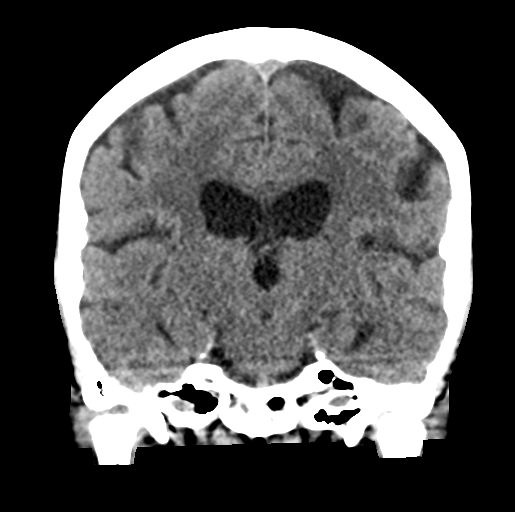

[Series 5: sagittal soft tissue · sagittal · 0.35mm/px · 3 of 51 slices shown]
[im 17/51  brain]
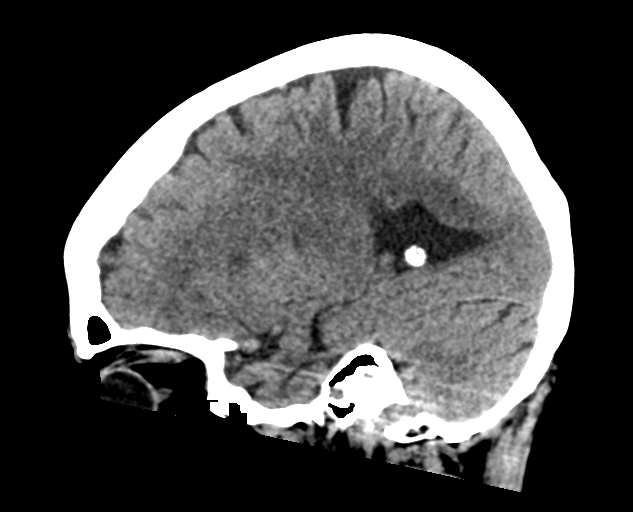
[im 26/51  brain]
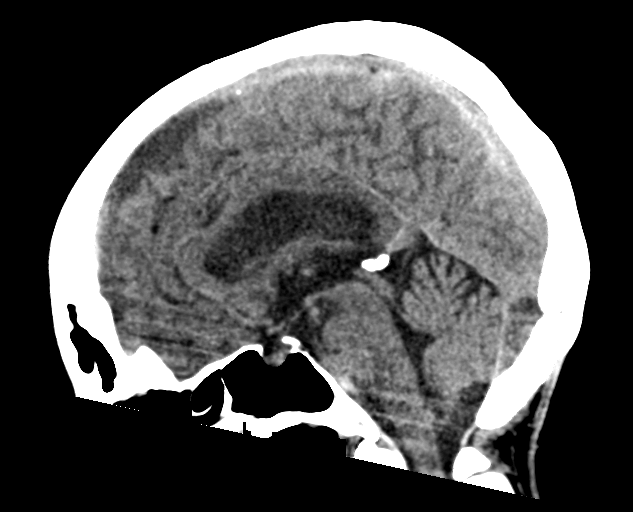
[im 34/51  brain]
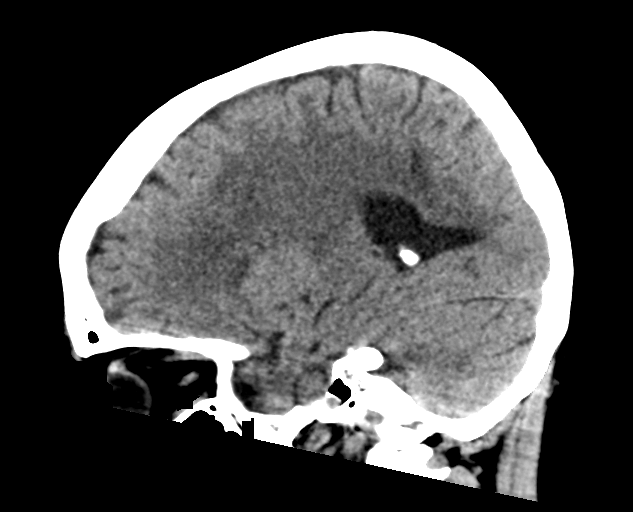

[16 of 47 positions shown; findings below may reference images not displayed]

FINDINGS: Brain: Diffusely enlarged ventricles and subarachnoid spaces. Patchy
white matter low density in both cerebral hemispheres. No
intracranial hemorrhage, mass lesion or CT evidence of acute
infarction.

Vascular: No hyperdense vessel or unexpected calcification.

Skull: Normal. Negative for fracture or focal lesion.

Sinuses/Orbits: Status post bilateral cataract extraction.
Unremarkable paranasal sinuses.

Other: None.
IMPRESSION: 1. No acute abnormality.
2. Mildly progressive atrophy and chronic small vessel white matter
ischemic changes.

## 2018-08-13 ENCOUNTER — Ambulatory Visit: Payer: Self-pay | Admitting: Family Medicine

## 2018-09-01 ENCOUNTER — Ambulatory Visit: Payer: Medicare HMO | Admitting: Family Medicine

## 2018-09-02 ENCOUNTER — Ambulatory Visit: Payer: Self-pay | Admitting: Family Medicine

## 2018-10-04 ENCOUNTER — Telehealth: Payer: Self-pay | Admitting: Family Medicine

## 2018-10-04 NOTE — Telephone Encounter (Signed)
Pt said someone called her last week and said they wanted to talk to her but she said no one has called her back.  [phone]  Thanks Con Memos

## 2018-10-05 NOTE — Telephone Encounter (Signed)
Melissa Fitzgerald, Did you possibly call this lady regarding her appointment with you on 10/20/18?

## 2018-10-20 ENCOUNTER — Other Ambulatory Visit: Payer: Self-pay

## 2018-10-20 NOTE — Progress Notes (Signed)
This encounter was created in error - please disregard.

## 2018-11-01 ENCOUNTER — Other Ambulatory Visit: Payer: Self-pay

## 2018-11-01 ENCOUNTER — Ambulatory Visit (INDEPENDENT_AMBULATORY_CARE_PROVIDER_SITE_OTHER): Payer: Medicare Other

## 2018-11-01 DIAGNOSIS — Z0000 Encounter for general adult medical examination without abnormal findings: Secondary | ICD-10-CM

## 2018-11-01 NOTE — Progress Notes (Addendum)
Subjective:   Melissa Fitzgerald is a 73 y.o. female who presents for Medicare Annual (Subsequent) preventive examination.    This visit is being conducted through telemedicine due to the COVID-19 pandemic. This patient has given me verbal consent via doximity to conduct this visit, patient states they are participating from their home address. Some vital signs may be absent or patient reported.    Patient identification: identified by name, DOB, and current address  Review of Systems:  N/A  Cardiac Risk Factors include: advanced age (>51mn, >>50women);hypertension;smoking/ tobacco exposure     Objective:     Vitals: There were no vitals taken for this visit.  There is no height or weight on file to calculate BMI. Unable to obtain vitals due to visit being conducted via telephonically.   Advanced Directives 11/01/2018 07/21/2017 06/23/2017 06/22/2017 06/16/2017 08/15/2016 06/18/2016  Does Patient Have a Medical Advance Directive? Yes Yes No No No No Yes  Type of Advance Directive Living will Living will - - - - -  Does patient want to make changes to medical advance directive? - - - - - - Yes (ED - Information included in AVS)  Would patient like information on creating a medical advance directive? - - No - Patient declined Yes (MAU/Ambulatory/Procedural Areas - Information given) No - Patient declined No - Patient declined -    Tobacco Social History   Tobacco Use  Smoking Status Current Some Day Smoker  . Packs/day: 0.00  . Types: Cigarettes  Smokeless Tobacco Never Used  Tobacco Comment   still smoking every now and then, rarely one     Ready to quit: Not Answered Counseling given: Not Answered Comment: still smoking every now and then, rarely one   Clinical Intake:  Pre-visit preparation completed: Yes  Pain : No/denies pain Pain Score: 0-No pain     Nutritional Risks: Nausea/ vomitting/ diarrhea(Diarrhea occasionally due to Chemo.) Diabetes: No  How often do you  need to have someone help you when you read instructions, pamphlets, or other written materials from your doctor or pharmacy?: 1 - Never  Interpreter Needed?: No  Information [facility] :: [facility] L[facility]  Past Medical History:  Diagnosis Date  . Anemia   . Colon polyp   . Depression   . Hyperlipidemia   . Hypertension   . Hypothyroidism   . Irritable bowel disease   . Kidney stone   . Thyroid disease    Past Surgical History:  Procedure Laterality Date  . ABDOMINAL HYSTERECTOMY    . BREAST ENHANCEMENT SURGERY    . BREAST SURGERY    . CHOLECYSTECTOMY  2000  . COLECTOMY  09/2013  . COLONOSCOPY  09-13-13   Dr SDonnella Sham . COLONOSCOPY WITH PROPOFOL N/A 10/18/2014   Procedure: COLONOSCOPY WITH PROPOFOL;  Surgeon: SChristene Lye MD;  Location: [facility] ENDOSCOPY;  Service: Endoscopy;  Laterality: N/A;  . CYSTOSCOPY/URETEROSCOPY/HOLMIUM LASER/STENT PLACEMENT Right 06/23/2017   Procedure: CYSTOSCOPY/URETEROSCOPY/HOLMIUM LASER/STENT PLACEMENT;  Surgeon: SAbbie Sons MD;  Location: [facility] ORS;  Service: Urology;  Laterality: Right;  . ESOPHAGOGASTRODUODENOSCOPY (EGD) WITH PROPOFOL N/A 01/03/2015   Procedure: ESOPHAGOGASTRODUODENOSCOPY (EGD) WITH PROPOFOL;  Surgeon: SChristene Lye MD;  Location: [facility] ENDOSCOPY;  Service: Endoscopy;  Laterality: N/A;  . EYE SURGERY     bilateral cataract   . FOOT SURGERY    . KIDNEY STONE SURGERY    . NASAL SINUS SURGERY    . PORT-A-CATH REMOVAL    . PORTACATH PLACEMENT  10/24/13  .  THYROID SURGERY    . TONSILLECTOMY     Family History  Problem Relation Age of Onset  . Heart disease Mother   . Diabetes Mother   . Arthritis Sister   . Hyperlipidemia Sister   . COPD Brother   . Diabetes Sister   . Colon cancer Other        first cousin  . Breast cancer Maternal Aunt   . Breast cancer Paternal Aunt    Social History   Socioeconomic History  . Marital status: Widowed    Spouse name: Not on file  . Number of children: 1  . Years of  education: Not on file  . Highest education level: 12th grade  Occupational History  . Occupation: retired  Scientific laboratory technician  . Financial resource strain: Not hard at all  . Food insecurity    Worry: Never true    Inability: Never true  . Transportation needs    Medical: No    Non-medical: No  Tobacco Use  . Smoking status: Current Some Day Smoker    Packs/day: 0.00    Types: Cigarettes  . Smokeless tobacco: Never Used  . Tobacco comment: still smoking every now and then, rarely one  Substance and Sexual Activity  . Alcohol use: Yes    Alcohol/week: 0.0 standard drinks    Comment: occasionally  . Drug use: No  . Sexual activity: Yes  Lifestyle  . Physical activity    Days per week: 0 days    Minutes per session: 0 min  . Stress: To some extent  Relationships  . Social Herbalist on phone: Patient refused    Gets together: Patient refused    Attends religious service: Patient refused    Active member of club or organization: Patient refused    Attends meetings of clubs or organizations: Patient refused    Relationship status: Patient refused  Other Topics Concern  . Not on file  Social History Narrative  . Not on file    Outpatient Encounter Medications as of 11/01/2018  Medication Sig  . aspirin-acetaminophen-caffeine (EXCEDRIN MIGRAINE) 250-250-65 MG tablet Take 2 tablets by mouth 2 (two) times daily as needed for headache or migraine.  . carboxymethylcellulose (REFRESH PLUS) 0.5 % SOLN Place 1 drop into both eyes daily as needed (dry eyes).   . Ferrous Sulfate 28 MG TABS Take 28 mg by mouth daily.  . hydrochlorothiazide (HYDRODIURIL) 25 MG tablet Take 1 tablet (25 mg total) by mouth daily.  Marland Kitchen levothyroxine (SYNTHROID, LEVOTHROID) 100 MCG tablet TAKE ONE TABLET BY MOUTH ONCE DAILY BEFORE BREAKFAST  . Menthol, Topical Analgesic, (BIOFREEZE EX) Apply 1 application topically daily as needed (pain).  . metoprolol succinate (TOPROL-XL) 100 MG 24 hr tablet Take 1  tablet (100 mg total) by mouth daily. Take with or immediately following a meal.  . ondansetron (ZOFRAN) 4 MG tablet Take 1 tablet (4 mg total) by mouth every 8 (eight) hours as needed for nausea or vomiting.  . butalbital-acetaminophen-caffeine (FIORICET, ESGIC) 50-325-40 MG tablet TAKE 1 TABLET BY MOUTH EVERY 6 HOURS AS NEEDED FOR HEADACHE (Patient not taking: Reported on 11/01/2018)  . omeprazole (PRILOSEC) 40 MG capsule TAKE 1 CAPSULE BY MOUTH ONCE DAILY (Patient not taking: Reported on 11/01/2018)  . venlafaxine (EFFEXOR) 37.5 MG tablet Take 1 tablet (37.5 mg total) by mouth 2 (two) times daily. (Patient not taking: Reported on 05/31/2018)   No facility-administered encounter medications on file as of 11/01/2018.     Activities  of Daily Living In your present state of health, do you have any difficulty performing the following activities: 11/01/2018  Hearing? N  Vision? Y  Comment Due to floaters- pt to schedule apt soon.  Difficulty concentrating or making decisions? N  Walking or climbing stairs? N  Dressing or bathing? N  Doing errands, shopping? N  Preparing Food and eating ? N  Using the Toilet? N  In the past six months, have you accidently leaked urine? N  Do you have problems with loss of bowel control? N  Managing your Medications? N  Managing your Finances? N  Housekeeping or managing your Housekeeping? N  Some recent data might be hidden    Patient Care Team: Chrismon, Vickki Muff, PA as PCP - General (Physician Assistant) Lequita Asal, MD as Referring Physician (Hematology and Oncology) Odette Fraction as Consulting Physician (Optometry)    Assessment:   This is a routine wellness examination for Tylisha.  Exercise Activities and Dietary recommendations Current Exercise Habits: The patient does not participate in regular exercise at present, Exercise limited by: None identified  Goals    . Exercise      Recommend starting to exercise. Pt is going to start going  to the gym 3 days a week for 1 hour.    . Exercise 3x per week (30 min per time)     Recommend to start walking 3 days a week for at least 30 minutes at a time.        Fall Risk: Fall Risk  07/21/2017 12/08/2016 06/18/2016 03/14/2016 01/04/2015  Falls in the past year? No No Yes No Yes  Number falls in past yr: - - 1 - 1  Injury with Fall? - - No - No  Comment - - - - minor bruising on leg. It has gone away. fall occurred 2-3 weeks ago  Risk for fall due to : - - - - Impaired balance/gait;Medication side effect  Risk for fall due to: Comment - - - - this started with the chemotherapy. The last treatment was in Jan 2016  Follow up - - Falls prevention discussed - -    FALL RISK PREVENTION PERTAINING TO THE HOME:  Any stairs in or around the home? No  If so, are there any without handrails? N/A  Home free of loose throw rugs in walkways, pet beds, electrical cords, etc? Yes  Adequate lighting in your home to reduce risk of falls? Yes   ASSISTIVE DEVICES UTILIZED TO PREVENT FALLS:  Life alert? No  Use of a cane, walker or w/c? No  Grab bars in the bathroom? No  Shower chair or bench in shower? Yes  Elevated toilet seat or a handicapped toilet? Yes    TIMED UP AND GO:  Was the test performed? No .    Depression Screen PHQ 2/9 Scores 07/21/2017 12/08/2016 06/18/2016 06/18/2016  PHQ - 2 Score _0 PHQ- 9 Score _1 Exception Documentation - - - -     Cognitive Function     6CIT Screen 11/01/2018 06/18/2016  What Year? 0 points 0 points  What month? 0 points 3 points  What time? 0 points 0 points  Count back from 20 0 points 0 points  Months in reverse 0 points 0 points  Repeat phrase 4 points 8 points  Total Score 4 11    Immunization History  Administered Date(s) Administered  . Influenza, High Dose Seasonal PF 02/04/2018  .  MMR 12/24/1994  . Pneumococcal Conjugate-13 06/18/2016  . Pneumococcal Polysaccharide-23 07/21/2017  . Td 12/17/1994, 09/03/2004   . Tdap 05/15/2014  . Zoster 09/30/2011    Qualifies for Shingles Vaccine? Yes  Zostavax completed 09/30/11. Due for Shingrix. Education has been provided regarding the importance of this vaccine. Pt has been advised to call insurance company to determine out of pocket expense. Advised may also receive vaccine at local pharmacy or Health Dept. Verbalized acceptance and understanding.  Tdap: Up to date  Flu Vaccine: Due fall 2020  Pneumococcal Vaccine: Completed series  Screening Tests Health Maintenance  Topic Date Due  . MAMMOGRAM  01/22/1996  . COLONOSCOPY  10/17/2017  . INFLUENZA VACCINE  10/23/2018  . DEXA SCAN  03/24/2026 (Originally 01/22/2011)  . TETANUS/TDAP  05/15/2024  . Hepatitis C Screening  Completed  . PNA vac Low Risk Adult  Completed    Cancer Screenings:  Colorectal Screening: Completed 10/18/14. Repeat every 3 years. Pt declined referral today.   Mammogram: Currently due, ordered today. Pt aware she will need to contact imaging center to set up apt.   Bone Density: Currently due, pt declined order today.   Lung Cancer Screening: (Low Dose CT Chest recommended if Age 38-80 years, 30 pack-year currently smoking OR have quit w/in 15years.) does qualify.   Additional Screening:  Hepatitis C Screening: Up to date  Vision Screening: Recommended annual ophthalmology exams for early detection of glaucoma and other disorders of the eye.  Dental Screening: Recommended annual dental exams for proper oral hygiene  Community Resource Referral:  CRR required this visit?  No       Plan:  I have personally reviewed and addressed the Medicare Annual Wellness questionnaire and have noted the following in the patient's chart:  A. Medical and social history B. Use of alcohol, tobacco or illicit drugs  C. Current medications and supplements D. Functional ability and status E.  Nutritional status F.  Physical activity G. Advance directives H. List of other  physicians I.  Hospitalizations, surgeries, and ER visits in previous 12 months J.  [place] such as hearing and vision if needed, cognitive and depression L. Referrals and appointments   In addition, I have reviewed and discussed with patient certain preventive protocols, quality metrics, and best practice recommendations. A written personalized care plan for preventive services as well as general preventive health recommendations were provided to patient. Nurse Health Advisor  Signed,    Tallis Soledad Shellman, [place]  12/06/7827 Nurse Health Advisor   Nurse Notes: Pt declined a colonoscopy referral or a DEXA scan.   Reviewed note of Nurse Health Advisor. Was available for consultation during screening. Agree with documentation and recommendations.

## 2018-11-01 NOTE — Patient Instructions (Addendum)
Melissa Fitzgerald , Thank you for taking time to come for your Medicare Wellness Visit. I appreciate your ongoing commitment to your health goals. Please review the following plan we discussed and let me know if I can assist you in the future.   Screening recommendations/referrals: Colonoscopy: Declined order today.  Mammogram: Ordered today, pt aware she will need to contact imaging center to set up apt.  Bone Density: Pt declines order today.  Recommended yearly ophthalmology/optometry visit for glaucoma screening and checkup Recommended yearly dental visit for hygiene and checkup  Vaccinations: Influenza vaccine: Due fall 2020 Pneumococcal vaccine: Completed series Tdap vaccine: Up to date, due 04/2024 Shingles vaccine: Pt declines today.     Advanced directives: Please bring a copy of your POA (Power of Attorney) and/or Living Will to your next appointment.   Conditions/risks identified: Continue to increase exercise to 3 days a week for 30 minutes at at time.  Next appointment: 11/19/18 @ 10:40 AM with Vernie Murders.    Preventive Care 73 Years and Older, Female Preventive care refers to lifestyle choices and visits with your health care provider that can promote health and wellness. What does preventive care include?  A yearly physical exam. This is also called an annual well check.  Dental exams once or twice a year.  Routine eye exams. Ask your health care provider how often you should have your eyes checked.  Personal lifestyle choices, including:  Daily care of your teeth and gums.  Regular physical activity.  Eating a healthy diet.  Avoiding tobacco and drug use.  Limiting alcohol use.  Practicing safe sex.  Taking low-dose aspirin every day.  Taking vitamin and mineral supplements as recommended by your health care provider. What happens during an annual well check? The services and screenings done by your health care provider during your annual well check  will depend on your age, overall health, lifestyle risk factors, and family history of disease. Counseling  Your health care provider may ask you questions about your:  Alcohol use.  Tobacco use.  Drug use.  Emotional well-being.  Home and relationship well-being.  Sexual activity.  Eating habits.  History of falls.  Memory and ability to understand (cognition).  Work and work Statistician.  Reproductive health. Screening  You may have the following tests or measurements:  Height, weight, and BMI.  Blood pressure.  Lipid and cholesterol levels. These may be checked every 5 years, or more frequently if you are over 25 years old.  Skin check.  Lung cancer screening. You may have this screening every year starting at age 73 if you have a 30-pack-year history of smoking and currently smoke or have quit within the past 15 years.  Fecal occult blood test (FOBT) of the stool. You may have this test every year starting at age 73.  Flexible sigmoidoscopy or colonoscopy. You may have a sigmoidoscopy every 5 years or a colonoscopy every 10 years starting at age 73.  Hepatitis C blood test.  Hepatitis B blood test.  Sexually transmitted disease (STD) testing.  Diabetes screening. This is done by checking your blood sugar (glucose) after you have not eaten for a while (fasting). You may have this done every 1-3 years.  Bone density scan. This is done to screen for osteoporosis. You may have this done starting at age 73.  Mammogram. This may be done every 1-2 years. Talk to your health care provider about how often you should have regular mammograms. Talk with your health care  provider about your test results, treatment options, and if necessary, the need for more tests. Vaccines  Your health care provider may recommend certain vaccines, such as:  Influenza vaccine. This is recommended every year.  Tetanus, diphtheria, and acellular pertussis (Tdap, Td) vaccine. You may  need a Td booster every 10 years.  Zoster vaccine. You may need this after age 73.  Pneumococcal 13-valent conjugate (PCV13) vaccine. One dose is recommended after age 73.  Pneumococcal polysaccharide (PPSV23) vaccine. One dose is recommended after age 73. Talk to your health care provider about which screenings and vaccines you need and how often you need them. This information is not intended to replace advice given to you by your health care provider. Make sure you discuss any questions you have with your health care provider. Document Released: 04/06/2015 Document Revised: 11/28/2015 Document Reviewed: 01/09/2015 Elsevier Interactive Patient Education  2017 [place] Prevention in the Home Falls can cause injuries. They can happen to people of all ages. There are many things you can do to make your home safe and to help prevent falls. What can I do on the outside of my home?  Regularly fix the edges of walkways and driveways and fix any cracks.  Remove anything that might make you trip as you walk through a door, such as a raised step or threshold.  Trim any bushes or trees on the path to your home.  Use bright outdoor lighting.  Clear any walking paths of anything that might make someone trip, such as rocks or tools.  Regularly check to see if handrails are loose or broken. Make sure that both sides of any steps have handrails.  Any raised decks and porches should have guardrails on the edges.  Have any leaves, snow, or ice cleared regularly.  Use sand or salt on walking paths during winter.  Clean up any spills in your garage right away. This includes oil or grease spills. What can I do in the bathroom?  Use night lights.  Install grab bars by the toilet and in the tub and shower. Do not use towel bars as grab bars.  Use non-skid mats or decals in the tub or shower.  If you need to sit down in the shower, use a plastic, non-slip stool.  Keep the floor  dry. Clean up any water that spills on the floor as soon as it happens.  Remove soap buildup in the tub or shower regularly.  Attach bath mats securely with double-sided non-slip rug tape.  Do not have throw rugs and other things on the floor that can make you trip. What can I do in the bedroom?  Use night lights.  Make sure that you have a light by your bed that is easy to reach.  Do not use any sheets or blankets that are too big for your bed. They should not hang down onto the floor.  Have a firm chair that has side arms. You can use this for support while you get dressed.  Do not have throw rugs and other things on the floor that can make you trip. What can I do in the kitchen?  Clean up any spills right away.  Avoid walking on wet floors.  Keep items that you use a lot in easy-to-reach places.  If you need to reach something above you, use a strong step stool that has a grab bar.  Keep electrical cords out of the way.  Do not use floor polish  or wax that makes floors slippery. If you must use wax, use non-skid floor wax.  Do not have throw rugs and other things on the floor that can make you trip. What can I do with my stairs?  Do not leave any items on the stairs.  Make sure that there are handrails on both sides of the stairs and use them. Fix handrails that are broken or loose. Make sure that handrails are as long as the stairways.  Check any carpeting to make sure that it is firmly attached to the stairs. Fix any carpet that is loose or worn.  Avoid having throw rugs at the top or bottom of the stairs. If you do have throw rugs, attach them to the floor with carpet tape.  Make sure that you have a light switch at the top of the stairs and the bottom of the stairs. If you do not have them, ask someone to add them for you. What else can I do to help prevent falls?  Wear shoes that:  Do not have high heels.  Have rubber bottoms.  Are comfortable and fit you  well.  Are closed at the toe. Do not wear sandals.  If you use a stepladder:  Make sure that it is fully opened. Do not climb a closed stepladder.  Make sure that both sides of the stepladder are locked into place.  Ask someone to hold it for you, if possible.  Clearly mark and make sure that you can see:  Any grab bars or handrails.  First and last steps.  Where the edge of each step is.  Use tools that help you move around (mobility aids) if they are needed. These include:  Canes.  Walkers.  Scooters.  Crutches.  Turn on the lights when you go into a dark area. Replace any light bulbs as soon as they burn out.  Set up your furniture so you have a clear path. Avoid moving your furniture around.  If any of your floors are uneven, fix them.  If there are any pets around you, be aware of where they are.  Review your medicines with your doctor. Some medicines can make you feel dizzy. This can increase your chance of falling. Ask your doctor what other things that you can do to help prevent falls. This information is not intended to replace advice given to you by your health care provider. Make sure you discuss any questions you have with your health care provider. Document Released: 01/04/2009 Document Revised: 08/16/2015 Document Reviewed: 04/14/2014 Elsevier Interactive Patient Education  2017 [employer].

## 2018-11-10 DIAGNOSIS — H43813 Vitreous degeneration, bilateral: Secondary | ICD-10-CM | POA: Diagnosis not present

## 2018-11-19 ENCOUNTER — Encounter: Payer: Medicare Other | Admitting: Family Medicine

## 2018-11-22 DIAGNOSIS — H26493 Other secondary cataract, bilateral: Secondary | ICD-10-CM | POA: Diagnosis not present

## 2018-11-22 DIAGNOSIS — H40003 Preglaucoma, unspecified, bilateral: Secondary | ICD-10-CM | POA: Diagnosis not present

## 2018-11-22 DIAGNOSIS — H43813 Vitreous degeneration, bilateral: Secondary | ICD-10-CM | POA: Diagnosis not present

## 2018-12-03 DIAGNOSIS — H26492 Other secondary cataract, left eye: Secondary | ICD-10-CM | POA: Diagnosis not present

## 2018-12-03 DIAGNOSIS — H26493 Other secondary cataract, bilateral: Secondary | ICD-10-CM | POA: Diagnosis not present

## 2018-12-04 ENCOUNTER — Other Ambulatory Visit: Payer: Self-pay | Admitting: Family Medicine

## 2018-12-04 DIAGNOSIS — I10 Essential (primary) hypertension: Secondary | ICD-10-CM

## 2018-12-06 NOTE — Telephone Encounter (Signed)
See Rx request ° °

## 2018-12-17 DIAGNOSIS — H26491 Other secondary cataract, right eye: Secondary | ICD-10-CM | POA: Diagnosis not present

## 2018-12-17 DIAGNOSIS — H26492 Other secondary cataract, left eye: Secondary | ICD-10-CM | POA: Diagnosis not present

## 2019-01-03 ENCOUNTER — Ambulatory Visit: Payer: Medicare Other | Admitting: Family Medicine

## 2019-01-04 ENCOUNTER — Ambulatory Visit (INDEPENDENT_AMBULATORY_CARE_PROVIDER_SITE_OTHER): Payer: Medicare Other | Admitting: Family Medicine

## 2019-01-04 ENCOUNTER — Other Ambulatory Visit: Payer: Self-pay

## 2019-01-04 ENCOUNTER — Encounter: Payer: Self-pay | Admitting: Family Medicine

## 2019-01-04 VITALS — BP 100/72 | HR 78 | Temp 97.7°F | Resp 16 | Ht 66.0 in | Wt 185.8 lb

## 2019-01-04 DIAGNOSIS — G8929 Other chronic pain: Secondary | ICD-10-CM

## 2019-01-04 DIAGNOSIS — R519 Headache, unspecified: Secondary | ICD-10-CM | POA: Diagnosis not present

## 2019-01-04 DIAGNOSIS — F3340 Major depressive disorder, recurrent, in remission, unspecified: Secondary | ICD-10-CM

## 2019-01-04 DIAGNOSIS — D509 Iron deficiency anemia, unspecified: Secondary | ICD-10-CM | POA: Diagnosis not present

## 2019-01-04 DIAGNOSIS — G479 Sleep disorder, unspecified: Secondary | ICD-10-CM

## 2019-01-04 DIAGNOSIS — Z23 Encounter for immunization: Secondary | ICD-10-CM

## 2019-01-04 DIAGNOSIS — E890 Postprocedural hypothyroidism: Secondary | ICD-10-CM

## 2019-01-04 DIAGNOSIS — I10 Essential (primary) hypertension: Secondary | ICD-10-CM

## 2019-01-04 MED ORDER — MIRTAZAPINE 7.5 MG PO TABS: 7.5000 mg | ORAL_TABLET | Freq: Every day | ORAL | 3 refills | Status: DC

## 2019-01-04 NOTE — Progress Notes (Signed)
Patient: Melissa Fitzgerald Female    DOB: [birth year]   73 y.o.   MRN: [id number] Visit Date: 01/04/2019  Today's Provider: Vernie Murders, PA   Chief Complaint  Patient presents with  . Hypertension  . Depression  . Insomnia   Subjective:     HPI   Hypertension, follow-up:  BP Readings from Last 3 Encounters:  01/04/19 100/72  05/31/18 102/80  02/04/18 130/86    She was last seen for hypertension 7 months ago.  BP at that visit was 102/80. Management changes since that visit include no changes. She reports good compliance with treatment. She is not having side effects.  She is not exercising. She is adherent to low salt diet.   Outside blood pressures are stable. She is experiencing none.  Patient denies chest pain and lower extremity edema.   Cardiovascular risk factors include advanced age (older than 18 for men, 71 for women), hypertension and smoking/ tobacco exposure.  Use of agents associated with hypertension: none.     Weight trend: increasing steadily Wt Readings from Last 3 Encounters:  01/04/19 185 lb 12.8 oz (84.3 kg)  05/31/18 179 lb (81.2 kg)  02/04/18 170 lb (77.1 kg)    Current diet: not asked  ------------------------------------------------------------------------   Depression, Follow-up  She  was last seen for this 7 months ago. Changes made at last visit include no changes.   She reports fair compliance with treatment. Patient reports she has not taken Venlafaxine for several months. She is not having side effects.   She reports fair tolerance of treatment. Current symptoms include: depressed mood, difficulty concentrating, fatigue, feelings of worthlessness/guilt, hopelessness, insomnia and weight gain She feels she is Improved since last visit.  ------------------------------------------------------------------------   Insomnia  She presents today for evaluation of insomnia. Onset was several months ago. Insomnia is  getting worse. She has trouble sleeping many times a week.   She has difficulty FALLING asleep. She has difficulty STAYING asleep. She does wake frequently to urinate. She does have urge to move legs when resting. She is not having pain when trying to sleep  She is having anxiety. She is having a lot of stress in her life. . She is having depression.  She is taking stimulant medications.  She is not taking new medications:  She is taking OTC sleeping aid. Aspirin pm. She is taking medications to help sleep. She is not drinking alcohol to help sleep. She is not using illicit drugs.  -------------------------------------------------------------------- Past Medical History:  Diagnosis Date  . Anemia   . Colon polyp   . Depression   . Hyperlipidemia   . Hypertension   . Hypothyroidism   . Irritable bowel disease   . Kidney stone   . Thyroid disease    Past Surgical History:  Procedure Laterality Date  . ABDOMINAL HYSTERECTOMY    . BREAST ENHANCEMENT SURGERY    . BREAST SURGERY    . CHOLECYSTECTOMY  2000  . COLECTOMY  09/2013  . COLONOSCOPY  09-13-13   Dr [name]  . COLONOSCOPY WITH PROPOFOL N/A 10/18/2014   Procedure: COLONOSCOPY WITH PROPOFOL;  Surgeon: [name], MD;  Location: [facility] ENDOSCOPY;  Service: Endoscopy;  Laterality: N/A;  . CYSTOSCOPY/URETEROSCOPY/HOLMIUM LASER/STENT PLACEMENT Right 06/23/2017   Procedure: CYSTOSCOPY/URETEROSCOPY/HOLMIUM LASER/STENT PLACEMENT;  Surgeon: Abbie Sons, MD;  Location: [facility] ORS;  Service: Urology;  Laterality: Right;  . ESOPHAGOGASTRODUODENOSCOPY (EGD) WITH PROPOFOL N/A 01/03/2015   Procedure: ESOPHAGOGASTRODUODENOSCOPY (EGD) WITH PROPOFOL;  Surgeon:  Seeplaputhur Robinette Haines, MD;  Location: [facility] ENDOSCOPY;  Service: Endoscopy;  Laterality: N/A;  . EYE SURGERY     bilateral cataract   . FOOT SURGERY    . KIDNEY STONE SURGERY    . NASAL SINUS SURGERY    . PORT-A-CATH REMOVAL    . PORTACATH PLACEMENT  10/24/13  . THYROID  SURGERY    . TONSILLECTOMY     Family History  Problem Relation Age of Onset  . Heart disease Mother   . Diabetes Mother   . Arthritis Sister   . Hyperlipidemia Sister   . COPD Brother   . Diabetes Sister   . Colon cancer Other        first cousin  . Breast cancer Maternal Aunt   . Breast cancer Paternal Aunt    Allergies  Allergen Reactions  . Codeine Nausea And Vomiting  . Oxycodone Nausea And Vomiting  . Penicillins Nausea And Vomiting    Has patient had a PCN reaction causing immediate rash, facial/tongue/throat swelling, SOB or lightheadedness with hypotension: No Has patient had a PCN reaction causing severe rash involving mucus membranes or skin necrosis: No Has patient had a PCN reaction that required hospitalization: No Has patient had a PCN reaction occurring within the last 10 years: No If all of the above answers are "NO", then may proceed with Cephalosporin use.   . Tramadol Nausea And Vomiting  . Trintellix [Vortioxetine] Itching    Current Outpatient Medications:  .  aspirin-acetaminophen-caffeine (EXCEDRIN MIGRAINE) 250-250-65 MG tablet, Take 2 tablets by mouth 2 (two) times daily as needed for headache or migraine., Disp: 30 tablet, Rfl: 0 .  carboxymethylcellulose (REFRESH PLUS) 0.5 % SOLN, Place 1 drop into both eyes daily as needed (dry eyes). , Disp: , Rfl:  .  hydrochlorothiazide (HYDRODIURIL) 25 MG tablet, Take 1 tablet by mouth once daily, Disp: 90 tablet, Rfl: 3 .  levothyroxine (SYNTHROID, LEVOTHROID) 100 MCG tablet, TAKE ONE TABLET BY MOUTH ONCE DAILY BEFORE BREAKFAST, Disp: 90 tablet, Rfl: 3 .  Menthol, Topical Analgesic, (BIOFREEZE EX), Apply 1 application topically daily as needed (pain)., Disp: , Rfl:  .  metoprolol succinate (TOPROL-XL) 100 MG 24 hr tablet, TAKE 1 TABLET BY MOUTH ONCE DAILY. TAKE WITH OR IMMEDIATELY FOLLOWING A MEAL., Disp: 90 tablet, Rfl: 0 .  ondansetron (ZOFRAN) 4 MG tablet, Take 1 tablet (4 mg total) by mouth every 8 (eight)  hours as needed for nausea or vomiting., Disp: 60 tablet, Rfl: 0 .  Ferrous Sulfate 28 MG TABS, Take 28 mg by mouth daily., Disp: , Rfl:  .  venlafaxine (EFFEXOR) 37.5 MG tablet, Take 1 tablet (37.5 mg total) by mouth 2 (two) times daily. (Patient not taking: Reported on 05/31/2018), Disp: 60 tablet, Rfl: 3  Review of Systems  Constitutional: Positive for activity change and fatigue.  Respiratory: Negative.   Cardiovascular: Negative.   Psychiatric/Behavioral: Positive for decreased concentration and sleep disturbance. The patient is nervous/anxious.    Social History   Tobacco Use  . Smoking status: Former Smoker    Packs/day: 0.00    Types: Cigarettes    Quit date: 03/24/2016    Years since quitting: 2.7  . Smokeless tobacco: Never Used  . Tobacco comment: still smoking every now and then, rarely one  Substance Use Topics  . Alcohol use: Yes    Alcohol/week: 0.0 standard drinks    Comment: occasionally     Objective:   BP 100/72 (BP Location: Left Arm, Patient Position: Sitting, Cuff  Size: Normal)   Pulse 78   Temp 97.7 F (36.5 C) (Temporal)   Resp 16   Ht 5\' 6"  (1.676 m)   Wt 185 lb 12.8 oz (84.3 kg)   SpO2 97%   BMI 29.99 kg/m  Vitals:   01/04/19 1459  BP: 100/72  Pulse: 78  Resp: 16  Temp: 97.7 F (36.5 C)  TempSrc: Temporal  SpO2: 97%  Weight: 185 lb 12.8 oz (84.3 kg)  Height: 5\' 6"  (1.676 m)  Body mass index is 29.99 kg/m.  Physical Exam Constitutional:      General: She is not in acute distress.    Appearance: She is well-developed.  HENT:     Head: Normocephalic and atraumatic.     Right Ear: Hearing normal.     Left Ear: Hearing normal.     Nose: Nose normal.  Eyes:     General: Lids are normal. No scleral icterus.       Right eye: No discharge.        Left eye: No discharge.     Conjunctiva/sclera: Conjunctivae normal.  Neck:     Musculoskeletal: Normal range of motion and neck supple.  Cardiovascular:     Rate and Rhythm: Normal rate and  regular rhythm.     Heart sounds: Normal heart sounds.  Pulmonary:     Effort: Pulmonary effort is normal. No respiratory distress.  Musculoskeletal: Normal range of motion.  Skin:    Findings: No lesion or rash.  Neurological:     Mental Status: She is alert and oriented to person, place, and time.  Psychiatric:        Attention and Perception: Attention normal.        Mood and Affect: Mood is depressed.        Speech: Speech normal.        Behavior: Behavior normal.        Thought Content: Thought content normal.    Depression screen [facility] 2/9 01/04/2019 07/21/2017 12/08/2016 06/18/2016 06/18/2016  Decreased Interest 1 3 2 3 3   Down, Depressed, Hopeless 1 3 2 3 3   PHQ - 2 Score 2 6 4 6 6   Altered sleeping 3 3 3 3 3   Tired, decreased energy 1 3 2 3 3   Change in appetite 1 1 2 3 3   Feeling bad or failure about yourself  1 0 2 0 0  Trouble concentrating 1 1 2 3 3   Moving slowly or fidgety/restless 1 2 2 3 3   Suicidal thoughts 0 0 1 1 1   PHQ-9 Score 10 16 18 22 22   Difficult doing work/chores Somewhat difficult Very difficult Very difficult - -       Assessment & Plan    1. Recurrent major depressive disorder, in remission ([place]) Feeling sad with PHQ-9 score of 10. No longer using the Venlafaxine the past several months. No suicidal ideation. Not sleeping well and energy level low. Will give trial of Remeron 7.5 mg hs for depression and help establish a better sleep pattern. Recheck routine labs for identification of any metabolic disorder. - CBC with Differential/Platelet - Comprehensive metabolic panel - T4 - TSH - mirtazapine (REMERON) 7.5 MG tablet; Take 1 tablet (7.5 mg total) by mouth at bedtime.  Dispense: 30 tablet; Refill: 3  2. Sleep disturbance Feels she is not resting well. Has used Tylenol PM occasionally with better sleep pattern. Feels this is most associated with depression. Will treat with Remeron 7.5 mg hs and get routine follow  up labs. May continue Tylenol PM  prn. - CBC with Differential/Platelet - Comprehensive metabolic panel - TSH - mirtazapine (REMERON) 7.5 MG tablet; Take 1 tablet (7.5 mg total) by mouth at bedtime.  Dispense: 30 tablet; Refill: 3  3. Iron deficiency anemia, unspecified iron deficiency anemia type Fatigue and feeling sluggish. Has not been using the iron supplement in several months. Recheck CBC and Ferritin level. - CBC with Differential/Platelet - Ferritin  4. Chronic nonintractable headache, unspecified headache type Denies any migraine in several months now, but, has a minor headache daily that is relieved by Tylenol. Recheck labs and thyroid function tests. May take magnesium 400 mg qd to prevent migraines/daily headaches. - CBC with Differential/Platelet - Comprehensive metabolic panel - T4 - TSH  5. Need for influenza vaccination - Flu Vaccine QUAD High Dose(Fluad)  6. Hypothyroidism, postop Tolerating the Levothyroxine 100 mcg qd but feeling a little sluggish and "depressed". No tremor or exophthalmos. Normal vitals and DTR's. Recheck thyroid function tests and follow up pending reports. - CBC with Differential/Platelet - T4 - TSH  7. Essential (primary) hypertension Well controlled BP today. Continues to use the HCTZ 25 mg qd with Metoprolol Succinate 100 mg qd. No edema, chest pains, dyspnea or palpitations.     Vernie Murders, PA  [facility]

## 2019-01-05 ENCOUNTER — Telehealth: Payer: Self-pay | Admitting: Family Medicine

## 2019-01-05 NOTE — Telephone Encounter (Signed)
Pt called to let Simona Huh know she cannot come to do labs today. She will try to come in on Thursday morning.  Thanks, [employer]

## 2019-01-06 DIAGNOSIS — D509 Iron deficiency anemia, unspecified: Secondary | ICD-10-CM | POA: Diagnosis not present

## 2019-01-06 DIAGNOSIS — G479 Sleep disorder, unspecified: Secondary | ICD-10-CM | POA: Diagnosis not present

## 2019-01-06 DIAGNOSIS — R519 Headache, unspecified: Secondary | ICD-10-CM | POA: Diagnosis not present

## 2019-01-06 DIAGNOSIS — G8929 Other chronic pain: Secondary | ICD-10-CM | POA: Diagnosis not present

## 2019-01-06 NOTE — Telephone Encounter (Signed)
Acknowledged.

## 2019-01-07 LAB — COMPREHENSIVE METABOLIC PANEL
ALT: 12 IU/L (ref 0–32)
AST: 19 IU/L (ref 0–40)
Albumin/Globulin Ratio: 1.3 (ref 1.2–2.2)
Albumin: 4.4 g/dL (ref 3.7–4.7)
Alkaline Phosphatase: 80 IU/L (ref 39–117)
BUN/Creatinine Ratio: 18 (ref 12–28)
BUN: 19 mg/dL (ref 8–27)
Bilirubin Total: 0.4 mg/dL (ref 0.0–1.2)
CO2: 25 mmol/L (ref 20–29)
Calcium: 9.5 mg/dL (ref 8.7–10.3)
Chloride: 103 mmol/L (ref 96–106)
Creatinine, Ser: 1.06 mg/dL — ABNORMAL HIGH (ref 0.57–1.00)
GFR calc Af Amer: 61 mL/min/{1.73_m2} (ref >59)
GFR calc non Af Amer: 53 mL/min/{1.73_m2} — ABNORMAL LOW (ref >59)
Globulin, Total: 3.4 g/dL (ref 1.5–4.5)
Glucose: 89 mg/dL (ref 65–99)
Potassium: 3.9 mmol/L (ref 3.5–5.2)
Sodium: 140 mmol/L (ref 134–144)
Total Protein: 7.8 g/dL (ref 6.0–8.5)

## 2019-01-07 LAB — CBC WITH DIFFERENTIAL/PLATELET
Basophils Absolute: 0 10*3/uL (ref 0.0–0.2)
Basos: 1 %
EOS (ABSOLUTE): 0.1 10*3/uL (ref 0.0–0.4)
Eos: 3 %
Hematocrit: 33.5 % — ABNORMAL LOW (ref 34.0–46.6)
Hemoglobin: 10.6 g/dL — ABNORMAL LOW (ref 11.1–15.9)
Immature Grans (Abs): 0 10*3/uL (ref 0.0–0.1)
Immature Granulocytes: 0 %
Lymphocytes Absolute: 1 10*3/uL (ref 0.7–3.1)
Lymphs: 22 %
MCH: 29.3 pg (ref 26.6–33.0)
MCHC: 31.6 g/dL (ref 31.5–35.7)
MCV: 93 fL (ref 79–97)
Monocytes Absolute: 0.4 10*3/uL (ref 0.1–0.9)
Monocytes: 8 %
Neutrophils Absolute: 3.1 10*3/uL (ref 1.4–7.0)
Neutrophils: 66 %
Platelets: 265 10*3/uL (ref 150–450)
RBC: 3.62 x10E6/uL — ABNORMAL LOW (ref 3.77–5.28)
RDW: 13.1 % (ref 11.7–15.4)
WBC: 4.6 10*3/uL (ref 3.4–10.8)

## 2019-01-07 LAB — FERRITIN: Ferritin: 95 ng/mL (ref 15–150)

## 2019-01-07 LAB — TSH: TSH: 0.382 u[IU]/mL — ABNORMAL LOW (ref 0.450–4.500)

## 2019-01-07 LAB — T4: T4, Total: 8.3 ug/dL (ref 4.5–12.0)

## 2019-02-03 ENCOUNTER — Other Ambulatory Visit: Payer: Self-pay

## 2019-02-03 ENCOUNTER — Telehealth: Payer: Self-pay | Admitting: Family Medicine

## 2019-02-03 DIAGNOSIS — R519 Headache, unspecified: Secondary | ICD-10-CM

## 2019-02-03 DIAGNOSIS — G8929 Other chronic pain: Secondary | ICD-10-CM

## 2019-02-03 NOTE — Telephone Encounter (Signed)
Yes, please. Diagnosis of chronic headaches.

## 2019-02-03 NOTE — Telephone Encounter (Signed)
Pt wanting her magnesium checked.  She said this was discussed to be done but wasn't put on her lab order.   Please call pt back to let her know when this can be done.  Thanks, [employer]

## 2019-02-03 NOTE — Telephone Encounter (Signed)
Melissa Huh Do you want her to have her magnesium checked

## 2019-02-03 NOTE — Telephone Encounter (Signed)
No answer

## 2019-02-04 NOTE — Telephone Encounter (Signed)
Pt planning to come on Mon to have her magnesium lab done.  Thanks, [employer]

## 2019-02-07 DIAGNOSIS — G8929 Other chronic pain: Secondary | ICD-10-CM | POA: Diagnosis not present

## 2019-02-07 DIAGNOSIS — R519 Headache, unspecified: Secondary | ICD-10-CM | POA: Diagnosis not present

## 2019-02-08 ENCOUNTER — Ambulatory Visit: Payer: Self-pay | Admitting: Family Medicine

## 2019-02-08 LAB — MAGNESIUM: Magnesium: 2.2 mg/dL (ref 1.6–2.3)

## 2019-02-10 ENCOUNTER — Telehealth: Payer: Self-pay | Admitting: *Deleted

## 2019-02-10 ENCOUNTER — Telehealth: Payer: Self-pay

## 2019-02-10 NOTE — Telephone Encounter (Signed)
Reviewed lab results and physician's note with patient. Stated she has stopped taking the Mg supplement and will call back at a later time to schedule 3 month follow-up.

## 2019-02-10 NOTE — Telephone Encounter (Signed)
-----   Message from [name], [place] sent at 02/09/2019  6:17 PM EST ----- Normal magnesium level. May stop the magnesium supplement and schedule recheck appointment in 3 months.

## 2019-02-10 NOTE — Telephone Encounter (Signed)
LMTCB may give results when patient calls back

## 2019-05-03 ENCOUNTER — Other Ambulatory Visit: Payer: Self-pay

## 2019-05-03 ENCOUNTER — Ambulatory Visit (INDEPENDENT_AMBULATORY_CARE_PROVIDER_SITE_OTHER): Payer: Medicare Other | Admitting: Family Medicine

## 2019-05-03 VITALS — BP 146/82 | HR 87 | Temp 97.3°F | Wt 187.0 lb

## 2019-05-03 DIAGNOSIS — F3340 Major depressive disorder, recurrent, in remission, unspecified: Secondary | ICD-10-CM | POA: Diagnosis not present

## 2019-05-03 DIAGNOSIS — G479 Sleep disorder, unspecified: Secondary | ICD-10-CM

## 2019-05-03 DIAGNOSIS — I10 Essential (primary) hypertension: Secondary | ICD-10-CM

## 2019-05-03 DIAGNOSIS — E890 Postprocedural hypothyroidism: Secondary | ICD-10-CM | POA: Diagnosis not present

## 2019-05-03 MED ORDER — LEVOTHYROXINE SODIUM 100 MCG PO TABS: ORAL_TABLET | ORAL | 3 refills | Status: DC

## 2019-05-03 MED ORDER — MIRTAZAPINE 7.5 MG PO TABS: 7.5000 mg | ORAL_TABLET | Freq: Every day | ORAL | 3 refills | Status: DC

## 2019-05-03 MED ORDER — METOPROLOL SUCCINATE ER 100 MG PO TB24: ORAL_TABLET | ORAL | 3 refills | Status: DC

## 2019-05-03 NOTE — Progress Notes (Signed)
Melissa Fitzgerald  MRN: [id number] DOB: 1945-04-27  Subjective:  HPI   Depression Follow up The patient was last seen on 01/04/19.  Patient had not been on her Venlafaxine prior tot he last visit.  She had a PHQ-9 score of 10.  She was started on Remeron/Mirtazepine 7.5 mg and states she is sleeping and feeling better.   Patient Active Problem List   Diagnosis Date Noted  . Hypoxia 06/23/2017  . Ureteral calculus 06/19/2017  . Nephrolithiasis 06/19/2017  . History of colon cancer 03/22/2015  . 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 Anemia, iron deficiency 10/23/2014  . HLD (hyperlipidemia) 10/23/2014  . Essential (primary) hypertension 10/23/2014  . Acid reflux 10/23/2014  . Clinical depression 10/23/2014  . Peptic ulcer 10/23/2014  . Hypothyroidism, postop 10/23/2014  . Anemia 10/18/2014  . Arthritis 10/18/2014  . Chronic constipation 10/18/2014  . Sleep disturbance 10/18/2014  . Elevated liver enzymes 10/18/2014  . Chronic headache 10/18/2014  . History of renal calculi 10/18/2014  . Malignant neoplasm of cecum ([place]) 10/17/2013  . Colon cancer ([place]) 09/21/2013    Past Medical History:  Diagnosis Date  . Anemia   . Colon polyp   . Depression   . Hyperlipidemia   . Hypertension   . Hypothyroidism   . Irritable bowel disease   . Kidney stone   . Thyroid disease    Past Surgical History:  Procedure Laterality Date  . ABDOMINAL HYSTERECTOMY    . BREAST ENHANCEMENT SURGERY    . BREAST SURGERY    . CHOLECYSTECTOMY  2000  . COLECTOMY  09/2013  . COLONOSCOPY  09-13-13   Dr [name]  . COLONOSCOPY WITH PROPOFOL N/A 10/18/2014   Procedure: COLONOSCOPY WITH PROPOFOL;  Surgeon: [name], MD;  Location: [facility] ENDOSCOPY;  Service: Endoscopy;  Laterality: N/A;  . CYSTOSCOPY/URETEROSCOPY/HOLMIUM LASER/STENT PLACEMENT Right 06/23/2017   Procedure: CYSTOSCOPY/URETEROSCOPY/HOLMIUM LASER/STENT PLACEMENT;  Surgeon:  Abbie Sons, MD;  Location: [facility] ORS;  Service: Urology;  Laterality: Right;  . ESOPHAGOGASTRODUODENOSCOPY (EGD) WITH PROPOFOL N/A 01/03/2015   Procedure: ESOPHAGOGASTRODUODENOSCOPY (EGD) WITH PROPOFOL;  Surgeon: [name], MD;  Location: [facility] END[facility]COPY;  Service: Endoscopy;  Laterality: N/A;  . EYE SURGERY     bilateral cataract   . FOOT SURGERY    . KIDNEY STONE SURGERY    . NASAL SINUS SURGERY    . PORT-A-CATH REMOVAL    . PORTACATH PLACEMENT  10/24/13  . THYROID SURGERY    . TONSILLECTOMY      Social History   Socioeconomic History  . Marital status: Widowed    Spouse name: Not on file  . Number of children: 1  . Years of education: Not on file  . Highest education level: 12th grade  Occupational History  . Occupation: retired  Tobacco Use  . Smoking status: Former Smoker    Packs/day: 0.00    Types: Cigarettes    Quit date: 03/24/2016    Years since quitting: 3.1  . Smokeless tobacco: Never Used  . Tobacco comment: still smoking every now and then, rarely one  Substance and Sexual Activity  . Alcohol use: Yes    Alcohol/week: 0.0 standard drinks    Comment: occasionally  . Drug use: No  . Sexual activity: Yes  Other Topics Concern  . Not on file  Social History Narrative  . Not on file   Social Determinants of Health   Financial Resource Strain:   .  Difficulty of Paying Living Expenses: Not on file  Food Insecurity:   . Worried About Charity fundraiser in the Last Year: Not on file  . Ran Out of Food in the Last Year: Not on file  Transportation Needs:   . Lack of Transportation (Medical): Not on file  . Lack of Transportation (Non-Medical): Not on file  Physical Activity: Inactive  . Days of Exercise per Week: 0 days  . Minutes of Exercise per Session: 0 min  Stress: Stress Concern Present  . Feeling of Stress : To some extent  Social Connections: Unknown  . Frequency of Communication with Friends and Family: Patient refused  . Frequency  of Social Gatherings with Friends and Family: Patient refused  . Attends Religious Services: Patient refused  . Active Member of Clubs or Organizations: Patient refused  . Attends Archivist Meetings: Patient refused  . Marital Status: Patient refused  Intimate Partner Violence: Unknown  . Fear of Current or Ex-Partner: Patient refused  . Emotionally Abused: Patient refused  . Physically Abused: Patient refused  . Sexually Abused: Patient refused    Outpatient Encounter Medications as of 05/03/2019  Medication Sig  . aspirin-acetaminophen-caffeine (EXCEDRIN MIGRAINE) [id number] MG tablet Take 2 tablets by mouth 2 (two) times daily as needed for headache or migraine.  . carboxymethylcellulose (REFRESH PLUS) 0.5 % SOLN Place 1 drop into both eyes daily as needed (dry eyes).   . Ferrous Sulfate 28 MG TABS Take 28 mg by mouth daily.  . hydrochlorothiazide (HYDRODIURIL) 25 MG tablet Take 1 tablet by mouth once daily  . levothyroxine (SYNTHROID, LEVOTHROID) 100 MCG tablet TAKE ONE TABLET BY MOUTH ONCE DAILY BEFORE BREAKFAST  . Menthol, Topical Analgesic, (BIOFREEZE EX) Apply 1 application topically daily as needed (pain).  . metoprolol succinate (TOPROL-XL) 100 MG 24 hr tablet TAKE 1 TABLET BY MOUTH ONCE DAILY. TAKE WITH OR IMMEDIATELY FOLLOWING A MEAL.  . mirtazapine (REMERON) 7.5 MG tablet Take 1 tablet (7.5 mg total) by mouth at bedtime.  . ondansetron (ZOFRAN) 4 MG tablet Take 1 tablet (4 mg total) by mouth every 8 (eight) hours as needed for nausea or vomiting.   No facility-administered encounter medications on file as of 05/03/2019.    Allergies  Allergen Reactions  . Codeine Nausea And Vomiting  . Oxycodone Nausea And Vomiting  . Penicillins Nausea And Vomiting    Has patient had a PCN reaction causing immediate rash, facial/tongue/throat swelling, SOB or lightheadedness with hypotension: No Has patient had a PCN reaction causing severe rash involving mucus membranes or skin  necrosis: No Has patient had a PCN reaction that required hospitalization: No Has patient had a PCN reaction occurring within the last 10 years: No If all of the above answers are "NO", then may proceed with Cephalosporin use.   . Tramadol Nausea And Vomiting  . Trintellix [Vortioxetine] Itching    Review of Systems  Constitutional: Negative for chills, fever and malaise/fatigue.  HENT: Negative for congestion and sore throat.   Respiratory: Negative for cough and shortness of breath.   Cardiovascular: Negative for chest pain, palpitations and leg swelling.  Gastrointestinal: Negative for abdominal pain and diarrhea.  Musculoskeletal: Positive for back pain. Negative for myalgias.  Neurological: Negative for dizziness and headaches.    Objective:  BP (!) 146/82 (BP Location: Right Arm, Patient Position: Sitting, Cuff Size: Normal)   Pulse 87   Temp (!) 97.3 F (36.3 C) (Skin)   Wt 187 lb (84.8 kg)   SpO2  97%   BMI 30.18 kg/m   Physical Exam  Constitutional: She is oriented to person, place, and time and well-developed, well-nourished, and in no distress.  HENT:  Head: Normocephalic.  Eyes: Conjunctivae are normal.  Cardiovascular: Normal rate and regular rhythm.  Pulmonary/Chest: Effort normal and breath sounds normal.  Abdominal: Soft.  Musculoskeletal:        General: Normal range of motion.     Cervical back: Neck supple.  Neurological: She is alert and oriented to person, place, and time.  Skin: No rash noted.  Psychiatric: Mood, affect and judgment normal.   Depression screen [facility] 2/9 01/04/2019 07/21/2017 12/08/2016 06/18/2016 06/18/2016  Decreased Interest 1 3 2 3 3   Down, Depressed, Hopeless 1 3 2 3 3   PHQ - 2 Score 2 6 4 6 6   Altered sleeping 3 3 3 3 3   Tired, decreased energy 1 3 2 3 3   Change in appetite 1 1 2 3 3   Feeling bad or failure about yourself  1 0 2 0 0  Trouble concentrating 1 1 2 3 3   Moving slowly or fidgety/restless 1 2 2 3 3   Suicidal thoughts  0 0 1 1 1   PHQ-9 Score 10 16 18 22 22   Difficult doing work/chores Somewhat difficult Very difficult Very difficult - -    Assessment and Plan :   1. Recurrent major depressive disorder, in remission ([place]) Improved depression. No suicidal ideation. Continue present dosage and recheck labs in 2 months. - mirtazapine (REMERON) 7.5 MG tablet; Take 1 tablet (7.5 mg total) by mouth at bedtime.  Dispense: 90 tablet; Refill: 3  2. Sleep disturbance Sleeping better with use of Remeron at bedtime. Continue present dosage and recheck in 2 months. - mirtazapine (REMERON) 7.5 MG tablet; Take 1 tablet (7.5 mg total) by mouth at bedtime.  Dispense: 90 tablet; Refill: 3  3. Essential (primary) hypertension Stable control of BP. Tolerating HCTZ 25 mg qd and Toprol-XL 100 mg qd. No palpitations, dyspnea or chest pains. Continue salt restriction and limit caffeine. Refill Toprol-XL and get follow up labs in 2 months.     Chemistry      Component Value Date/Time   NA 140 01/06/2019 1406   NA 146 (H) 04/19/2014 0853   K 3.9 01/06/2019 1406   K 3.4 (L) 04/19/2014 0853   CL 103 01/06/2019 1406   CL 109 (H) 04/19/2014 0853   CO2 25 01/06/2019 1406   CO2 32 04/19/2014 0853   BUN 19 01/06/2019 1406   BUN 13 04/19/2014 0853   CREATININE 1.06 (H) 01/06/2019 1406   CREATININE 0.77 12/09/2016 1058      Component Value Date/Time   CALCIUM 9.5 01/06/2019 1406   CALCIUM 8.3 (L) 04/19/2014 0853   ALKPHOS 80 01/06/2019 1406   ALKPHOS 76 04/19/2014 0853   AST 19 01/06/2019 1406   AST 14 (L) 04/19/2014 0853   ALT 12 01/06/2019 1406   ALT 18 04/19/2014 0853   BILITOT 0.4 01/06/2019 1406   BILITOT 0.2 04/19/2014 0853        - metoprolol succinate (TOPROL-XL) 100 MG 24 hr tablet; TAKE 1 TABLET BY MOUTH ONCE DAILY. TAKE WITH OR IMMEDIATELY FOLLOWING A MEAL.  Dispense: 90 tablet; Refill: 3  4. Hypothyroidism, postop Stable on Levothyroxine 100 mcg qd. No exophthalmos or pretibial edema. No tremor,  constipation, diarrhea, palpitations or dry skin/brittle nails. Refilled Levothyroxine and plan follow up labs in April 2021. - levothyroxine (SYNTHROID) 100 MCG tablet; TAKE ONE TABLET  BY MOUTH ONCE DAILY BEFORE BREAKFAST  Dispense: 90 tablet; Refill: 3

## 2019-05-04 ENCOUNTER — Telehealth: Payer: Self-pay

## 2019-05-04 NOTE — Telephone Encounter (Signed)
Are you ok with her taking Ibuprofen at her age?   Copied from [place] [id number]. Topic: General - Inquiry >> May 04, 2019 11:35 AM Richardo Priest, NT wrote: Reason for CRM: Pt called in stating she is curious about the 200mg  ibuprofen for hand pain, and the bottle states she should not take it if she has high blood pressure. Pt states she will wait to hear from office if this is ok for her to take. Please advise.

## 2019-05-04 NOTE — Telephone Encounter (Signed)
Pt called back, requesting a call back from Clinic.   Best contact: [phone]

## 2019-05-05 NOTE — Telephone Encounter (Signed)
Advised 

## 2019-05-05 NOTE — Telephone Encounter (Signed)
This warning on the bottle usually is to advise patients to make [place] aware they are taking it. Our plan is to use it only short term and not forever. Also, kidney function is good without alarming BP readings and should be OK to use this.

## 2019-05-06 ENCOUNTER — Encounter: Payer: Self-pay | Admitting: Family Medicine

## 2019-05-19 ENCOUNTER — Ambulatory Visit: Payer: Medicare Other | Attending: Internal Medicine

## 2019-05-19 DIAGNOSIS — Z23 Encounter for immunization: Secondary | ICD-10-CM | POA: Insufficient documentation

## 2019-05-19 NOTE — Progress Notes (Signed)
   Covid-19 Vaccination Clinic  Name:  Melissa Fitzgerald    MRN: [id number] DOB: 1945-04-28  05/19/2019  Melissa Fitzgerald was observed post Covid-19 immunization for 15 minutes without incidence. She was provided with Vaccine Information Sheet and instruction to access the V-Safe system.   Melissa Fitzgerald was instructed to call 911 with any severe reactions post vaccine: Marland Kitchen Difficulty breathing  . Swelling of your face and throat  . A fast heartbeat  . A bad rash all over your body  . Dizziness and weakness    Immunizations Administered    Name Date Dose VIS Date Route   Pfizer COVID-19 Vaccine 05/19/2019 11:27 AM 0.3 mL 03/04/2019 Intramuscular   Manufacturer: [place]   Lot: [id number]   [place]: [id number]

## 2019-06-14 ENCOUNTER — Ambulatory Visit: Payer: Medicare Other | Attending: Internal Medicine

## 2019-06-14 DIAGNOSIS — Z23 Encounter for immunization: Secondary | ICD-10-CM

## 2019-06-14 NOTE — Progress Notes (Signed)
   Covid-19 Vaccination Clinic  Name:  Melissa Fitzgerald    MRN: [id number] DOB: [birth year]  06/14/2019  [name] was observed post Covid-19 immunization for 15 minutes without incident. She was provided with Vaccine Information Sheet and instruction to access the V-Safe system.   [name] was instructed to call 911 with any severe reactions post vaccine: Marland Kitchen Difficulty breathing  . Swelling of face and throat  . A fast heartbeat  . A bad rash all over body  . Dizziness and weakness   Immunizations Administered    Name Date Dose VIS Date Route   Pfizer COVID-19 Vaccine 06/14/2019 11:14 AM 0.3 mL 03/04/2019 Intramuscular   Manufacturer: [employer]   Lot: [id number]   [place]: [id number]

## 2019-09-13 ENCOUNTER — Ambulatory Visit
Admission: RE | Admit: 2019-09-13 | Discharge: 2019-09-13 | Disposition: A | Discharge status: Home / Self Care | Payer: Medicare Other | Source: Ambulatory Visit | Attending: Family Medicine | Admitting: Family Medicine

## 2019-09-13 ENCOUNTER — Ambulatory Visit
Admission: RE | Admit: 2019-09-13 | Discharge: 2019-09-13 | Disposition: A | Discharge status: Home / Self Care | Payer: Medicare Other | Attending: Family Medicine | Admitting: Family Medicine

## 2019-09-13 ENCOUNTER — Other Ambulatory Visit: Payer: Self-pay

## 2019-09-13 ENCOUNTER — Ambulatory Visit (INDEPENDENT_AMBULATORY_CARE_PROVIDER_SITE_OTHER): Payer: Medicare Other | Admitting: Family Medicine

## 2019-09-13 ENCOUNTER — Encounter: Payer: Self-pay | Admitting: Family Medicine

## 2019-09-13 VITALS — BP 124/74 | HR 97 | Temp 97.1°F | Resp 16 | Wt 184.4 lb

## 2019-09-13 DIAGNOSIS — M19042 Primary osteoarthritis, left hand: Secondary | ICD-10-CM | POA: Diagnosis not present

## 2019-09-13 DIAGNOSIS — W57XXXA Bitten or stung by nonvenomous insect and other nonvenomous arthropods, initial encounter: Secondary | ICD-10-CM | POA: Diagnosis not present

## 2019-09-13 DIAGNOSIS — G5603 Carpal tunnel syndrome, bilateral upper limbs: Secondary | ICD-10-CM | POA: Diagnosis not present

## 2019-09-13 DIAGNOSIS — M79643 Pain in unspecified hand: Secondary | ICD-10-CM | POA: Diagnosis not present

## 2019-09-13 DIAGNOSIS — M19041 Primary osteoarthritis, right hand: Secondary | ICD-10-CM

## 2019-09-13 DIAGNOSIS — S80869A Insect bite (nonvenomous), unspecified lower leg, initial encounter: Secondary | ICD-10-CM

## 2019-09-13 MED ORDER — DICLOFENAC SODIUM 75 MG PO TBEC: 75.0000 mg | DELAYED_RELEASE_TABLET | Freq: Two times a day (BID) | ORAL | 0 refills | Status: DC

## 2019-09-13 NOTE — Progress Notes (Signed)
Established patient visit   Patient: Melissa Fitzgerald   DOB: [birth year]   74 y.o. Female  MRN: [id number] Visit Date: 09/13/2019  Today's healthcare provider: Vernie Murders, PA   Chief Complaint  Patient presents with  . Numbness   Subjective    HPI Patient comes in office today to address concerns of numbness and tingling in both hands for 2 months or more. Patient would like to address today multiple insect bites on her legs that has been present for  7 days. Patient states that skin is red, raised and itchy. Patient states that areas swelled up and seemed clear fluid, patient states that she has tried otc Benadryl with little to no relief.   Patient Active Problem List   Diagnosis Date Noted  . Hypoxia 06/23/2017  . Ureteral calculus 06/19/2017  . Nephrolithiasis 06/19/2017  . History of colon cancer 03/22/2015  . 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 Anemia, iron deficiency 10/23/2014  . HLD (hyperlipidemia) 10/23/2014  . Essential (primary) hypertension 10/23/2014  . Acid reflux 10/23/2014  . Clinical depression 10/23/2014  . Peptic ulcer 10/23/2014  . Hypothyroidism, postop 10/23/2014  . Anemia 10/18/2014  . Arthritis 10/18/2014  . Chronic constipation 10/18/2014  . Sleep disturbance 10/18/2014  . Elevated liver enzymes 10/18/2014  . Chronic headache 10/18/2014  . History of renal calculi 10/18/2014  . Malignant neoplasm of cecum ([place]) 10/17/2013  . Colon cancer ([place]) 09/21/2013   Past Medical History:  Diagnosis Date  . Anemia   . Colon polyp   . Depression   . Hyperlipidemia   . Hypertension   . Hypothyroidism   . Irritable bowel disease   . Kidney stone   . Thyroid disease    Past Surgical History:  Procedure Laterality Date  . ABDOMINAL HYSTERECTOMY    . BREAST ENHANCEMENT SURGERY    . BREAST SURGERY    . CHOLECYSTECTOMY  2000  . COLECTOMY  09/2013  . COLONOSCOPY  09-13-13   Dr [name]    . COLONOSCOPY WITH PROPOFOL N/A 10/18/2014   Procedure: COLONOSCOPY WITH PROPOFOL;  Surgeon: [name], MD;  Location: [facility] ENDOSCOPY;  Service: Endoscopy;  Laterality: N/A;  . CYSTOSCOPY/URETEROSCOPY/HOLMIUM LASER/STENT PLACEMENT Right 06/23/2017   Procedure: CYSTOSCOPY/URETEROSCOPY/HOLMIUM LASER/STENT PLACEMENT;  Surgeon: Abbie Sons, MD;  Location: [facility] ORS;  Service: Urology;  Laterality: Right;  . ESOPHAGOGASTRODUODENOSCOPY (EGD) WITH PROPOFOL N/A 01/03/2015   Procedure: ESOPHAGOGASTRODUODENOSCOPY (EGD) WITH PROPOFOL;  Surgeon: [name], MD;  Location: [facility] ENDOSCOPY;  Service: Endoscopy;  Laterality: N/A;  . EYE SURGERY     bilateral cataract   . FOOT SURGERY    . KIDNEY STONE SURGERY    . NASAL SINUS SURGERY    . PORT-A-CATH REMOVAL    . PORTACATH PLACEMENT  10/24/13  . THYROID SURGERY    . TONSILLECTOMY     Family History  Problem Relation Age of Onset  . Heart disease Mother   . Diabetes Mother   . Arthritis Sister   . Hyperlipidemia Sister   . COPD Brother   . Diabetes Sister   . Colon cancer Other        first cousin  . Breast cancer Maternal Aunt   . Breast cancer Paternal Aunt    Allergies  Allergen Reactions  . Codeine Nausea And Vomiting  . Oxycodone Nausea And Vomiting  . Penicillins Nausea And Vomiting    Has patient had a PCN reaction  causing immediate rash, facial/tongue/throat swelling, SOB or lightheadedness with hypotension: No Has patient had a PCN reaction causing severe rash involving mucus membranes or skin necrosis: No Has patient had a PCN reaction that required hospitalization: No Has patient had a PCN reaction occurring within the last 10 years: No If all of the above answers are "NO", then may proceed with Cephalosporin use.   . Tramadol Nausea And Vomiting  . Trintellix [Vortioxetine] Itching     Medications: Outpatient Medications Prior to Visit  Medication Sig  . aspirin-acetaminophen-caffeine (EXCEDRIN  MIGRAINE) 250-250-65 MG tablet Take 2 tablets by mouth 2 (two) times daily as needed for headache or migraine.  . carboxymethylcellulose (REFRESH PLUS) 0.5 % SOLN Place 1 drop into both eyes daily as needed (dry eyes).   . Ferrous Sulfate 28 MG TABS Take 28 mg by mouth daily.  . hydrochlorothiazide (HYDRODIURIL) 25 MG tablet Take 1 tablet by mouth once daily  . levothyroxine (SYNTHROID) 100 MCG tablet TAKE ONE TABLET BY MOUTH ONCE DAILY BEFORE BREAKFAST  . Menthol, Topical Analgesic, (BIOFREEZE EX) Apply 1 application topically daily as needed (pain).  . metoprolol succinate (TOPROL-XL) 100 MG 24 hr tablet TAKE 1 TABLET BY MOUTH ONCE DAILY. TAKE WITH OR IMMEDIATELY FOLLOWING A MEAL.  . mirtazapine (REMERON) 7.5 MG tablet Take 1 tablet (7.5 mg total) by mouth at bedtime.  . ondansetron (ZOFRAN) 4 MG tablet Take 1 tablet (4 mg total) by mouth every 8 (eight) hours as needed for nausea or vomiting.   No facility-administered medications prior to visit.    Review of Systems  Constitutional: Negative.   HENT: Negative.   Respiratory: Negative.   Gastrointestinal: Negative.   Skin: Positive for rash.  Neurological: Positive for numbness.     Objective    BP 124/74   Pulse 97   Temp (!) 97.1 F (36.2 C) (Oral)   Resp 16   Wt 184 lb 6.4 oz (83.6 kg)   SpO2 99%   BMI 29.76 kg/m  BP Readings from Last 3 Encounters:  09/13/19 124/74  05/03/19 (!) 146/82  01/04/19 100/72   Wt Readings from Last 3 Encounters:  09/13/19 184 lb 6.4 oz (83.6 kg)  05/03/19 187 lb (84.8 kg)  01/04/19 185 lb 12.8 oz (84.3 kg)   Physical Exam Constitutional:      General: She is not in acute distress.    Appearance: She is well-developed.  HENT:     Head: Normocephalic and atraumatic.     Right Ear: Hearing normal.     Left Ear: Hearing normal.     Nose: Nose normal.  Eyes:     General: Lids are normal. No scleral icterus.       Right eye: No discharge.        Left eye: No discharge.      Conjunctiva/sclera: Conjunctivae normal.  Pulmonary:     Effort: Pulmonary effort is normal. No respiratory distress.  Musculoskeletal:     Comments: Prominent joints/Heberden's nodes in fingers and enlarged first CMC joints of both hands. Good pulse and fair grip strength. Positive Phalen sign (numbness in all fingers) - negative Tinel sign for carpal tunnel syndrome. No blanching or cyanotic color of fingers.  Skin:    Findings: Rash present. No lesion.     Comments: Scattered insect/mosquito bites with central scabs from scratching. Very pruritic.  Neurological:     Mental Status: She is alert and oriented to person, place, and time.  Psychiatric:  Speech: Speech normal.        Behavior: Behavior normal.        Thought Content: Thought content normal.     No results found for any visits on 09/13/19.  Assessment & Plan     1. Arthritis of both hands Prominent Heberden's nodes on fingers and some stiffness with prominent first CMC joints bilaterally. No known injury, Intermittent aches and pains. Will get x-ray of hands and given Voltaren-EC 75 mg BID. Recheck prn. - DG Hand Complete Left - DG Hand Complete Right - diclofenac (VOLTAREN) 75 MG EC tablet; Take 1 tablet (75 mg total) by mouth 2 (two) times daily.  Dispense: 30 tablet; Refill: 0  2. Bilateral carpal tunnel syndrome Occasional numbness in both hands. Fair grip strength. Suspect carpal tunnel syndrome secondary to arthritis in hands. No significant grip strength weakness. Treat with Diclofenac. May need referral to hand surgeon if no improvement in the next 7-10 days. - diclofenac (VOLTAREN) 75 MG EC tablet; Take 1 tablet (75 mg total) by mouth 2 (two) times daily.  Dispense: 30 tablet; Refill: 0  3. Insect bite of lower leg, unspecified laterality, initial encounter Multiple pruritic insect/mosquito bites on lower legs. No sign of infection. May apply Hydrocortisone cream up to TID prn. Given handout on mosquito  bite prevention.   No follow-ups on file.      Andres Shad, PA, have reviewed all documentation for this visit. The documentation on 09/13/19 for the exam, diagnosis, procedures, and orders are all accurate and complete.    Vernie Murders, [place] [phone] (phone) [phone] (fax)  [place]

## 2019-09-13 NOTE — Patient Instructions (Signed)
Arthritis Arthritis is a term that is commonly used to refer to joint pain or joint disease. There are more than 100 types of arthritis. What are the causes? The most common cause of this condition is wear and tear of a joint. Other causes include:  Gout.  Inflammation of a joint.  An infection of a joint.  Sprains and other injuries near the joint.  A reaction to medicines or drugs, or an allergic reaction. In some cases, the cause may not be known. What are the signs or symptoms? The main symptom of this condition is pain in the joint during movement. Other symptoms include:  Redness, swelling, or stiffness at a joint.  Warmth coming from the joint.  Fever.  Overall feeling of illness. How is this diagnosed? This condition may be diagnosed with a physical exam and tests, including:  Blood tests.  Urine tests.  Imaging tests, such as X-rays, an MRI, or a CT scan. Sometimes, fluid is removed from a joint for testing. How is this treated? This condition may be treated with:  Treatment of the cause, if it is known.  Rest.  Raising (elevating) the joint.  Applying cold or hot packs to the joint.  Medicines to improve symptoms and reduce inflammation.  Injections of a steroid such as cortisone into the joint to help reduce pain and inflammation. Depending on the cause of your arthritis, you may need to make lifestyle changes to reduce stress on your joint. Changes may include:  Exercising more.  Losing weight. Follow these instructions at home: Medicines  Take over-the-counter and prescription medicines only as told by your health care provider.  Do not take aspirin to relieve pain if your health care provider thinks that gout may be causing your pain. Activity  Rest your joint if told by your health care provider. Rest is important when your disease is active and your joint feels painful, swollen, or stiff.  Avoid activities that make the pain worse. It is  important to balance activity with rest.  Exercise your joint regularly with range-of-motion exercises as told by your health care provider. Try doing low-impact exercise, such as: ? Swimming. ? Water aerobics. ? Biking. ? Walking. Managing pain, stiffness, and swelling      If directed, put ice on the joint. ? Put ice in a plastic bag. ? Place a towel between your skin and the bag. ? Leave the ice on for 20 minutes, 2-3 times per day.  If your joint is swollen, raise (elevate) it above the level of your heart if directed by your health care provider.  If your joint feels stiff in the morning, try taking a warm shower.  If directed, apply heat to the affected area as often as told by your health care provider. Use the heat source that your health care provider recommends, such as a moist heat pack or a heating pad. If you have diabetes, do not apply heat without permission from your health care provider. To apply heat: ? Place a towel between your skin and the heat source. ? Leave the heat on for 20-30 minutes. ? Remove the heat if your skin turns bright red. This is especially important if you are unable to feel pain, heat, or cold. You may have a greater risk of getting burned. General instructions  Do not use any products that contain nicotine or tobacco, such as cigarettes, e-cigarettes, and chewing tobacco. If you need help quitting, ask your health care provider.  Keep   all follow-up visits as told by your health care provider. This is important. Contact a health care provider if:  The pain gets worse.  You have a fever. Get help right away if:  You develop severe joint pain, swelling, or redness.  Many joints become painful and swollen.  You develop severe back pain.  You develop severe weakness in your leg.  You cannot control your bladder or bowels. Summary  Arthritis is a term that is commonly used to refer to joint pain or joint disease. There are more than  100 types of arthritis.  The most common cause of this condition is wear and tear of a joint. Other causes include gout, inflammation or infection of the joint, sprains, or allergies.  Symptoms of this condition include redness, swelling, or stiffness of the joint. Other symptoms include warmth, fever, or feeling ill.  This condition is treated with rest, elevation, medicines, and applying cold or hot packs.  Follow your health care provider's instructions about medicines, activity, exercises, and other home care treatments. This information is not intended to replace advice given to you by your health care provider. Make sure you discuss any questions you have with your health care provider. Document Revised: 02/15/2018 Document Reviewed: 02/15/2018 Elsevier Patient Education  [place]. Preventing Mosquito-Borne Illnesses Mosquito-borne illnesses are diseases that people get from mosquitoes. Examples of mosquito-borne illnesses include Zika virus disease, West Nile virus infection, and dengue fever. You may be at risk for mosquito-borne illnesses both at home and when you travel. How can mosquito bites affect me? Most mosquito bites will not turn into a serious illness. However, you may become sick if a mosquito bites you and gives you a virus or parasite when you are bitten. You can get sick with different illnesses depending on where you live or travel. What can increase my risk? You may be at risk if you are planning to travel internationally where mosquito-borne illnesses are common. What actions can I take to prevent mosquito bites? Take the following actions to protect yourself from mosquito bites at home and when you travel:  Use insect repellent that contains one of these active ingredients: DEET, picaridin (KBR 3023), oil of lemon eucalyptus (OLE), para-menthane-diol (PMD), 2-undecanone, and IR3535. ? Insect repellent with the higher percentage of one of these active  ingredients will provide more hours of protection. ? Most insect repellents are safe to use during pregnancy but should only be used when in areas with a high risk of mosquito-borne illness. Talk with your health care provider if you have questions or concerns. ? Do not use OLE on children who are younger than 62 years of age. Do not use insect repellent on babies who are younger than 23 months of age. ? Only apply insect repellent to surfaces that are directly exposed to air. Apply to any exposed skin or to the surface of your clothing. Do not apply insect repellent underneath clothing.  Wear loose clothing that covers your arms and legs.  Limit your outdoor activities. Avoid being outdoors in the early evening when mosquitoes are most active.  Do not open windows unless they have screens.  Sleep under mosquito nets.  If you are using sunscreen, put it on before the insect repellent.  Treat clothing with permethrin. Do not apply permethrin directly to your skin. Follow label directions for safe use.  Get rid of any standing water near you because that is where mosquitoes often reproduce. Standing water is often found in  items such as buckets, bowls, animal food dishes, and flowerpots.  Use screens on windows and doors to keep mosquitoes outside. If you are planning to travel internationally:  Talk with a health care provider who is familiar with travel medicine 4-6 weeks before your trip. Ask about the risks of mosquito-borne illness and how to stay safe.  Take malaria prevention medicine as told by your health care provider. This is important if you will be traveling to an area where malaria is found. You may need to start taking the medicine before you go on your trip. How do I use DEET insect repellent?   Use DEET according to the directions on the label.  When applying DEET to children, use the lowest effective concentration. Repellent with 10% DEET will last approximately 2-3 hours,  while 30% DEET will last 4-5 hours. Do not use DEET on babies who are younger than 80 months of age. Do not apply DEET more often than one time a day to children who are younger than 50 years of age.  Avoid prolonged or excessive use of DEET. Use it sparingly to cover exposed skin and clothing. Skin irritation can occur in some people, but this is not common.  Wash all treated skin and clothing with soap and water after you go back indoors.  Do not allow children to apply insect repellent by themselves.  Do not apply DEET near cuts or open wounds.  Do not apply DEET to a child's hands or near a child's eyes and mouth. ? If DEET is accidentally sprayed in the eyes, wash the eyes out with large amounts of water. ? If your child swallows a DEET-containing product, rinse the mouth, have the child drink water, and call your health care provider.  Store DEET where children cannot reach it.  If you are pregnant, use DEET only when you are in areas with a high risk of mosquito-borne illness. Where to find more information To learn more about how to prevent mosquito-borne illnesses, visit the Centers for Disease Control and Prevention (CDC): http://www.wolf.info/ Contact a health care provider if:  You recently traveled to an area where mosquito-borne illnesses are common, and you notice any of the following symptoms up to 2 weeks after being bitten by a mosquito: ? Sudden high fever. ? Chills. ? Severe headache. ? Pain behind the eyes. ? Bone pain. ? Nausea, vomiting, or diarrhea. ? Rash. ? Muscle and joint pain. ? Lack of appetite. ? Fatigue. ? Feeling extremely tired or generally unwell. Summary  You may be at risk for mosquito-borne illnesses both at home and when you travel.  Talk with a health care provider who is familiar with travel medicine 4-6 weeks before you travel internationally. Ask about the risks of mosquito-borne illness and how to stay safe.  Use an insect repellent containing  DEET or another approved active ingredient to prevent mosquito bites. This information is not intended to replace advice given to you by your health care provider. Make sure you discuss any questions you have with your health care provider. Document Revised: 12/03/2018 Document Reviewed: 04/14/2018 Elsevier Patient Education  [place].

## 2019-09-15 ENCOUNTER — Telehealth: Payer: Self-pay

## 2019-09-15 NOTE — Telephone Encounter (Signed)
Patient called, left VM to return the call to the office. 

## 2019-09-15 NOTE — Telephone Encounter (Signed)
-----   Message from [name], [place] sent at 09/15/2019  8:36 AM EDT ----- Diffuse arthritic changes in both hands primarily in the first carpometacarpal and distal finger joints. If no better with use of Diclofenac, will need referral to a hand surgeon.

## 2019-09-15 NOTE — Telephone Encounter (Signed)
lmtcb okay for [facility] triage to advise .KW 

## 2019-09-16 ENCOUNTER — Other Ambulatory Visit: Payer: Self-pay | Admitting: Family Medicine

## 2019-09-16 ENCOUNTER — Telehealth: Payer: Self-pay | Admitting: *Deleted

## 2019-09-16 MED ORDER — IBUPROFEN 600 MG PO TABS
600.0000 mg | ORAL_TABLET | Freq: Three times a day (TID) | ORAL | 0 refills | Status: DC | PRN
Start: 2019-09-16 — End: 2019-09-20

## 2019-09-16 NOTE — Telephone Encounter (Signed)
Sent Ibuprofen 600 mg TID to the pharmacy to try. May need a prednisone taper if no better with this trial.

## 2019-09-16 NOTE — Telephone Encounter (Signed)
Pt called in and was given the left hand x ray message from [employer], [place]. He mentioned the Diclofenac 75 mg tablets prescribed for the arthritis was not good for people with high BP which he has.   He is requesting something else for pain he can use that will not interfere with his BP.  He mentioned the 800 mg Ibuprofen prescription works for him but none of the other medications even Tylenol does not help him at all.  I informed him that the ibuprofen is in the same class of drugs as the diclofenac called NSAIDS and it is not advised for people with hypertension.  "I did not realize that".  "Whatever Simona Huh wants to prescribe just call it into my pharmacy and the pharmacy will notify me when it's ready".  "Thank you"

## 2019-09-16 NOTE — Telephone Encounter (Signed)
Attempted to contact pt; left message on voicemail. 

## 2019-09-20 ENCOUNTER — Other Ambulatory Visit: Payer: Self-pay | Admitting: Family Medicine

## 2019-09-20 ENCOUNTER — Other Ambulatory Visit: Payer: Self-pay

## 2019-09-20 DIAGNOSIS — M19041 Primary osteoarthritis, right hand: Secondary | ICD-10-CM

## 2019-09-20 DIAGNOSIS — M19042 Primary osteoarthritis, left hand: Secondary | ICD-10-CM

## 2019-09-20 MED ORDER — PREDNISONE 5 MG PO TABS: ORAL_TABLET | ORAL | 0 refills | Status: DC

## 2019-09-20 NOTE — Telephone Encounter (Signed)
Can try a prednisone taper for hands and numbness. If no better when finished, will schedule referral to a hand specialist. (Be sure she under stands taper dosing).

## 2019-09-20 NOTE — Telephone Encounter (Signed)
Copied from [place] [id number]. Topic: General - Other >> Sep 20, 2019  1:09 PM Leward Quan A wrote: Reason for CRM: Patient called to inform Dr Natale Milch that the Ibuprofen cause her feet to swell so she cant take it for the pain in her hands. Also wanted to ask Dr Natale Milch can he please prescribe something else and also want to know what will be done about the numbness in her hands. Please call Ph#  [phone]

## 2019-09-21 MED ORDER — PREDNISONE 5 MG PO TABS: ORAL_TABLET | ORAL | 0 refills | Status: DC

## 2019-09-21 NOTE — Telephone Encounter (Signed)
LMTCB. LMTCB, [facility] may give message and please ask about pharmacy.

## 2019-11-01 NOTE — Progress Notes (Signed)
Subjective:   Melissa Fitzgerald is a 74 y.o. female who presents for Medicare Annual (Subsequent) preventive examination.  I connected with Melissa Fitzgerald today by telephone and verified that I am speaking with the correct person using two identifiers. Location patient: home Location provider: work Persons participating in the virtual visit: patient, provider.   I discussed the limitations, risks, security and privacy concerns of performing an evaluation and management service by telephone and the availability of in person appointments. I also discussed with the patient that there may be a patient responsible charge related to this service. The patient expressed understanding and verbally consented to this telephonic visit.    Interactive audio and video telecommunications were attempted between this provider and patient, however failed, due to patient having technical difficulties OR patient did not have access to video capability.  We continued and completed visit with audio only.  Review of Systems    N/A  Cardiac Risk Factors include: advanced age (>38mn, >>32women);dyslipidemia;hypertension     Objective:    There were no vitals filed for this visit. There is no height or weight on file to calculate BMI.  Advanced Directives 11/02/2019 11/01/2018 07/21/2017 06/23/2017 06/22/2017 06/16/2017 08/15/2016  Does Patient Have a Medical Advance Directive? Yes Yes Yes No No No No  Type of Advance Directive Living will Living will Living will - - - -  Does patient want to make changes to medical advance directive? - - - - - - -  Would patient like information on creating a medical advance directive? - - - No - Patient declined Yes (MAU/Ambulatory/Procedural Areas - Information given) No - Patient declined No - Patient declined    Current Medications (verified) Outpatient Encounter Medications as of 11/02/2019  Medication Sig  . aspirin-acetaminophen-caffeine (EXCEDRIN MIGRAINE) 250-250-65 MG tablet  Take 2 tablets by mouth 2 (two) times daily as needed for headache or migraine.  . carboxymethylcellulose (REFRESH PLUS) 0.5 % SOLN Place 1 drop into both eyes daily as needed (dry eyes).   . hydrochlorothiazide (HYDRODIURIL) 25 MG tablet Take 1 tablet by mouth once daily  . levothyroxine (SYNTHROID) 100 MCG tablet TAKE ONE TABLET BY MOUTH ONCE DAILY BEFORE BREAKFAST  . Menthol, Topical Analgesic, (BIOFREEZE EX) Apply 1 application topically daily as needed (pain).  . metoprolol succinate (TOPROL-XL) 100 MG 24 hr tablet TAKE 1 TABLET BY MOUTH ONCE DAILY. TAKE WITH OR IMMEDIATELY FOLLOWING A MEAL.  .Marland Kitchenondansetron (ZOFRAN) 4 MG tablet Take 1 tablet (4 mg total) by mouth every 8 (eight) hours as needed for nausea or vomiting.  . Ferrous Sulfate 28 MG TABS Take 28 mg by mouth daily. (Patient not taking: Reported on 11/02/2019)  . mirtazapine (REMERON) 7.5 MG tablet Take 1 tablet (7.5 mg total) by mouth at bedtime. (Patient not taking: Reported on 11/02/2019)  . predniSONE (DELTASONE) 5 MG tablet Taper down dosage by 1 tablet daily by mouth starting at 6 day 1, 5 day 2, 4 day 3, 3 day 4, 2 day 5 and 1 day 6. Divide tablets among meals and bedtime. (Patient not taking: Reported on 11/02/2019)   No facility-administered encounter medications on file as of 11/02/2019.    Allergies (verified) Codeine, Oxycodone, Penicillins, Tramadol, and Trintellix [vortioxetine]   History: Past Medical History:  Diagnosis Date  . Anemia   . Colon polyp   . Depression   . Hyperlipidemia   . Hypertension   . Hypothyroidism   . Irritable bowel disease   . Kidney stone   .  Thyroid disease    Past Surgical History:  Procedure Laterality Date  . ABDOMINAL HYSTERECTOMY    . BREAST ENHANCEMENT SURGERY    . BREAST SURGERY    . CHOLECYSTECTOMY  2000  . COLECTOMY  09/2013  . COLONOSCOPY  09-13-13   Dr [name]  . COLONOSCOPY WITH PROPOFOL N/A 10/18/2014   Procedure: COLONOSCOPY WITH PROPOFOL;  Surgeon: [name], MD;  Location: [facility];  Service: Endoscopy;  Laterality: N/A;  . CYSTOSCOPY/URETEROSCOPY/HOLMIUM LASER/STENT PLACEMENT Right 06/23/2017   Procedure: CYSTOSCOPY/URETEROSCOPY/HOLMIUM LASER/STENT PLACEMENT;  Surgeon: Abbie Sons, MD;  Location: [facility] ORS;  Service: Urology;  Laterality: Right;  . ESOPHAGOGASTRODUODENOSCOPY (EGD) WITH PROPOFOL N/A 01/03/2015   Procedure: ESOPHAGOGASTRODUODENOSCOPY (EGD) WITH PROPOFOL;  Surgeon: [name], MD;  Location: [facility] END[facility]COPY;  Service: Endoscopy;  Laterality: N/A;  . EYE SURGERY     bilateral cataract   . FOOT SURGERY    . KIDNEY STONE SURGERY    . NASAL SINUS SURGERY    . PORT-A-CATH REMOVAL    . PORTACATH PLACEMENT  10/24/13  . THYROID SURGERY    . TONSILLECTOMY     Family History  Problem Relation Age of Onset  . Heart disease Mother   . Diabetes Mother   . Arthritis Sister   . Hyperlipidemia Sister   . COPD Brother   . Diabetes Sister   . Colon cancer Other        first cousin  . Breast cancer Maternal Aunt   . Breast cancer Paternal Aunt    Social History   Socioeconomic History  . Marital status: Widowed    Spouse name: Not on file  . Number of children: 1  . Years of education: Not on file  . Highest education level: 12th grade  Occupational History  . Occupation: retired  Tobacco Use  . Smoking status: Current Some Day Smoker    Packs/day: 0.00    Types: Cigarettes    Last attempt to quit: 03/24/2016    Years since quitting: 3.6  . Smokeless tobacco: Never Used  . Tobacco comment: still smoking every now and then, rarely one  Vaping Use  . Vaping Use: Never used  Substance and Sexual Activity  . Alcohol use: Yes    Alcohol/week: 0.0 standard drinks    Comment: occasionally  . Drug use: No  . Sexual activity: Yes  Other Topics Concern  . Not on file  Social History Narrative  . Not on file   Social Determinants of Health   Financial Resource Strain: Low Risk   . Difficulty of Paying  Living Expenses: Not hard at all  Food Insecurity: No Food Insecurity  . Worried About Charity fundraiser in the Last Year: Never true  . Ran Out of Food in the Last Year: Never true  Transportation Needs: No Transportation Needs  . Lack of Transportation (Medical): No  . Lack of Transportation (Non-Medical): No  Physical Activity: Inactive  . Days of Exercise per Week: 0 days  . Minutes of Exercise per Session: 0 min  Stress: Stress Concern Present  . Feeling of Stress : To some extent  Social Connections: Socially Isolated  . Frequency of Communication with Friends and Family: More than three times a week  . Frequency of Social Gatherings with Friends and Family: Three times a week  . Attends Religious Services: Never  . Active Member of Clubs or Organizations: No  . Attends Archivist Meetings: Never  . Marital Status:  Widowed    Tobacco Counseling Ready to quit: No Counseling given: No Comment: still smoking every now and then, rarely one   Clinical Intake:  Pre-visit preparation completed: Yes  Pain : No/denies pain     Nutritional Risks: None Diabetes: No  How often do you need to have someone help you when you read instructions, pamphlets, or other written materials from your doctor or pharmacy?: 1 - Never  Diabetic? No  Interpreter Needed?: No  Information entered by :: [facility], LPN   Activities of Daily Living In your present state of health, do you have any difficulty performing the following activities: 11/02/2019  Hearing? N  Vision? N  Difficulty concentrating or making decisions? Y  Comment Due to previos chemo trearment. Currently taking Neuriva.  Walking or climbing stairs? Y  Comment Due to recent leg pain.  Dressing or bathing? N  Doing errands, shopping? N  Preparing Food and eating ? N  Using the Toilet? N  In the past six months, have you accidently leaked urine? N  Do you have problems with loss of bowel control? N    Managing your Medications? N  Managing your Finances? N  Housekeeping or managing your Housekeeping? N  Some recent data might be hidden    Patient Care Team: Chrismon, Vickki Muff, PA as PCP - General (Physician Assistant) Lequita Asal, MD as Referring Physician (Hematology and Oncology) Odette Fraction as Consulting Physician (Optometry) Carloyn Manner, MD as Referring Physician (Otolaryngology)  Indicate any recent Medical Services you may have received from other than [facility] providers in the past year (date may be approximate).     Assessment:   This is a routine wellness examination for Melissa Fitzgerald.  Hearing/Vision screen No exam data present  Dietary issues and exercise activities discussed: Current Exercise Habits: The patient does not participate in regular exercise at present, Exercise limited by: None identified  Goals    . Exercise      Recommend starting to exercise. Pt is going to start going to the gym 3 days a week for 1 hour.    . Exercise 3x per week (30 min per time)     Recommend to start walking 3 days a week for at least 30 minutes at a time.       Depression Screen PHQ 2/9 Scores 11/02/2019 01/04/2019 07/21/2017 12/08/2016 06/18/2016 06/18/2016 01/04/2015  PHQ - 2 Score _0 PHQ- 9 Score _1 -  Exception Documentation - - - - - - -    Fall Risk Fall Risk  11/02/2019 01/04/2019 01/04/2019 07/21/2017 12/08/2016  Falls in the past year? 1 0 0 No No  Number falls in past yr: 0 0 0 - -  Injury with Fall? 0 0 0 - -  Comment - - - - -  Risk for fall due to : - - - - -  Risk for fall due to: Comment - - - - -  Follow up - - - - -    Any stairs in or around the home? No  If so, are there any without handrails? No  Home free of loose throw rugs in walkways, pet beds, electrical cords, etc? Yes  Adequate lighting in your home to reduce risk of falls? Yes   ASSISTIVE DEVICES UTILIZED TO PREVENT FALLS:  Life alert? No  Use of a  cane, walker or w/c? No  Grab bars in the bathroom? No  Shower chair or bench in shower? No  Elevated toilet seat or a handicapped toilet? Yes    Cognitive Function: Declined today.     6CIT Screen 11/01/2018 06/18/2016  What Year? 0 points 0 points  What month? 0 points 3 points  What time? 0 points 0 points  Count back from 20 0 points 0 points  Months in reverse 0 points 0 points  Repeat phrase 4 points 8 points  Total Score 4 11    Immunizations Immunization History  Administered Date(s) Administered  . Fluad Quad(high Dose 65+) 01/04/2019  . Influenza, High Dose Seasonal PF 02/04/2018  . MMR 12/24/1994  . PFIZER SARS-COV-2 Vaccination 05/19/2019, 06/14/2019  . Pneumococcal Conjugate-13 06/18/2016  . Pneumococcal Polysaccharide-23 07/21/2017  . Td 12/17/1994, 09/03/2004  . Tdap 05/15/2014  . Zoster 09/30/2011    TDAP status: Up to date Flu Vaccine status: Up to date Pneumococcal vaccine status: Up to date Covid-19 vaccine status: Completed vaccines  Qualifies for Shingles Vaccine? Yes   Zostavax completed Yes   Shingrix Completed?: No.    Education has been provided regarding the importance of this vaccine. Patient has been advised to call insurance company to determine out of pocket expense if they have not yet received this vaccine. Advised may also receive vaccine at local pharmacy or Health Dept. Verbalized acceptance and understanding.  Screening Tests Health Maintenance  Topic Date Due  . MAMMOGRAM  Never done  . COLONOSCOPY  10/17/2017  . INFLUENZA VACCINE  10/23/2019  . DEXA SCAN  03/24/2026 (Originally 01/22/2011)  . TETANUS/TDAP  05/15/2024  . COVID-19 Vaccine  Completed  . Hepatitis C Screening  Completed  . PNA vac Low Risk Adult  Completed    Health Maintenance  Health Maintenance Due  Topic Date Due  . MAMMOGRAM  Never done  . COLONOSCOPY  10/17/2017  . INFLUENZA VACCINE  10/23/2019    Colorectal cancer screening: Referral to GI placed  today. Pt aware the office will call re: appt. Mammogram status: Ordered today. Pt provided with contact info and advised to call to schedule appt.  Bone Density status: Ordered today. Pt provided with contact info and advised to call to schedule appt.  Lung Cancer Screening: (Low Dose CT Chest recommended if Age 38-80 years, 30 pack-year currently smoking OR have quit w/in 15years.) does qualify, however declines order at this time.    Additional Screening:  Hepatitis C Screening: Up to date  Vision Screening: Recommended annual ophthalmology exams for early detection of glaucoma and other disorders of the eye. Is the patient up to date with their annual eye exam?  Yes  Who is the provider or what is the name of the office in which the patient attends annual eye exams? Dr Gwynn Burly If pt is not established with a provider, would they like to be referred to a provider to establish care? No .   Dental Screening: Recommended annual dental exams for proper oral hygiene  Community Resource Referral / Chronic Care Management: CRR required this visit?  No   CCM required this visit?  No      Plan:     I have personally reviewed and noted the following in the patient's chart:   . Medical and social history . Use of alcohol, tobacco or illicit drugs  . Current medications and supplements . Functional ability and status . Nutritional status . Physical activity . Advanced directives . List of other physicians . Hospitalizations, surgeries, and ER visits in previous 12 months .  Vitals . Screenings to include cognitive, depression, and falls . Referrals and appointments  In addition, I have reviewed and discussed with patient certain preventive protocols, quality metrics, and best practice recommendations. A written personalized care plan for preventive services as well as general preventive health recommendations were provided to patient.     Melissa Fitzgerald, [place]   [id number]    Nurse Notes: None.

## 2019-11-02 ENCOUNTER — Other Ambulatory Visit: Payer: Self-pay

## 2019-11-02 ENCOUNTER — Ambulatory Visit (INDEPENDENT_AMBULATORY_CARE_PROVIDER_SITE_OTHER): Payer: Medicare Other

## 2019-11-02 DIAGNOSIS — Z1231 Encounter for screening mammogram for malignant neoplasm of breast: Secondary | ICD-10-CM

## 2019-11-02 DIAGNOSIS — Z13820 Encounter for screening for osteoporosis: Secondary | ICD-10-CM

## 2019-11-02 DIAGNOSIS — Z0000 Encounter for general adult medical examination without abnormal findings: Secondary | ICD-10-CM

## 2019-11-02 DIAGNOSIS — Z1211 Encounter for screening for malignant neoplasm of colon: Secondary | ICD-10-CM | POA: Diagnosis not present

## 2019-11-02 DIAGNOSIS — Z86010 Personal history of colonic polyps: Secondary | ICD-10-CM | POA: Diagnosis not present

## 2019-11-02 NOTE — Patient Instructions (Signed)
Melissa Fitzgerald , Thank you for taking time to come for your Medicare Wellness Visit. I appreciate your ongoing commitment to your health goals. Please review the following plan we discussed and let me know if I can assist you in the future.   Screening recommendations/referrals: Colonoscopy: Referral to GI placed today. Pt aware the office will call re: appt. Mammogram: Ordered today. Pt provided with contact info and advised to call to schedule appt.  Bone Density: Ordered today. Pt aware office will contact her to schedule apt. Recommended yearly ophthalmology/optometry visit for glaucoma screening and checkup Recommended yearly dental visit for hygiene and checkup  Vaccinations: Influenza vaccine: Due fall 2021 Pneumococcal vaccine: Completed series Tdap vaccine: Up to date, due 04/2024 Shingles vaccine: Shingrix discussed. Please contact your pharmacy for coverage information.     Advanced directives: Please bring a copy of your POA (Power of Attorney) and/or Living Will to your next appointment.   Conditions/risks identified: Fall risk prevention discussed today.   Next appointment: 11/07/20 @ 11:00 AM for repeat AWV. Declined scheduling a follow up with PCP at this time.    Preventive Care 78 Years and Older, Female Preventive care refers to lifestyle choices and visits with your health care provider that can promote health and wellness. What does preventive care include?  A yearly physical exam. This is also called an annual well check.  Dental exams once or twice a year.  Routine eye exams. Ask your health care provider how often you should have your eyes checked.  Personal lifestyle choices, including:  Daily care of your teeth and gums.  Regular physical activity.  Eating a healthy diet.  Avoiding tobacco and drug use.  Limiting alcohol use.  Practicing safe sex.  Taking low-dose aspirin every day.  Taking vitamin and mineral supplements as recommended by your  health care provider. What happens during an annual well check? The services and screenings done by your health care provider during your annual well check will depend on your age, overall health, lifestyle risk factors, and family history of disease. Counseling  Your health care provider may ask you questions about your:  Alcohol use.  Tobacco use.  Drug use.  Emotional well-being.  Home and relationship well-being.  Sexual activity.  Eating habits.  History of falls.  Memory and ability to understand (cognition).  Work and work Statistician.  Reproductive health. Screening  You may have the following tests or measurements:  Height, weight, and BMI.  Blood pressure.  Lipid and cholesterol levels. These may be checked every 5 years, or more frequently if you are over 65 years old.  Skin check.  Lung cancer screening. You may have this screening every year starting at age 106 if you have a 30-pack-year history of smoking and currently smoke or have quit within the past 15 years.  Fecal occult blood test (FOBT) of the stool. You may have this test every year starting at age 15.  Flexible sigmoidoscopy or colonoscopy. You may have a sigmoidoscopy every 5 years or a colonoscopy every 10 years starting at age 58.  Hepatitis C blood test.  Hepatitis B blood test.  Sexually transmitted disease (STD) testing.  Diabetes screening. This is done by checking your blood sugar (glucose) after you have not eaten for a while (fasting). You may have this done every 1-3 years.  Bone density scan. This is done to screen for osteoporosis. You may have this done starting at age 36.  Mammogram. This may be done every  1-2 years. Talk to your health care provider about how often you should have regular mammograms. Talk with your health care provider about your test results, treatment options, and if necessary, the need for more tests. Vaccines  Your health care provider may recommend  certain vaccines, such as:  Influenza vaccine. This is recommended every year.  Tetanus, diphtheria, and acellular pertussis (Tdap, Td) vaccine. You may need a Td booster every 10 years.  Zoster vaccine. You may need this after age 48.  Pneumococcal 13-valent conjugate (PCV13) vaccine. One dose is recommended after age 36.  Pneumococcal polysaccharide (PPSV23) vaccine. One dose is recommended after age 98. Talk to your health care provider about which screenings and vaccines you need and how often you need them. This information is not intended to replace advice given to you by your health care provider. Make sure you discuss any questions you have with your health care provider. Document Released: 04/06/2015 Document Revised: 11/28/2015 Document Reviewed: 01/09/2015 Elsevier Interactive Patient Education  2017 [place] Prevention in the Home Falls can cause injuries. They can happen to people of all ages. There are many things you can do to make your home safe and to help prevent falls. What can I do on the outside of my home?  Regularly fix the edges of walkways and driveways and fix any cracks.  Remove anything that might make you trip as you walk through a door, such as a raised step or threshold.  Trim any bushes or trees on the path to your home.  Use bright outdoor lighting.  Clear any walking paths of anything that might make someone trip, such as rocks or tools.  Regularly check to see if handrails are loose or broken. Make sure that both sides of any steps have handrails.  Any raised decks and porches should have guardrails on the edges.  Have any leaves, snow, or ice cleared regularly.  Use sand or salt on walking paths during winter.  Clean up any spills in your garage right away. This includes oil or grease spills. What can I do in the bathroom?  Use night lights.  Install grab bars by the toilet and in the tub and shower. Do not use towel bars as  grab bars.  Use non-skid mats or decals in the tub or shower.  If you need to sit down in the shower, use a plastic, non-slip stool.  Keep the floor dry. Clean up any water that spills on the floor as soon as it happens.  Remove soap buildup in the tub or shower regularly.  Attach bath mats securely with double-sided non-slip rug tape.  Do not have throw rugs and other things on the floor that can make you trip. What can I do in the bedroom?  Use night lights.  Make sure that you have a light by your bed that is easy to reach.  Do not use any sheets or blankets that are too big for your bed. They should not hang down onto the floor.  Have a firm chair that has side arms. You can use this for support while you get dressed.  Do not have throw rugs and other things on the floor that can make you trip. What can I do in the kitchen?  Clean up any spills right away.  Avoid walking on wet floors.  Keep items that you use a lot in easy-to-reach places.  If you need to reach something above you, use a strong  step stool that has a grab bar.  Keep electrical cords out of the way.  Do not use floor polish or wax that makes floors slippery. If you must use wax, use non-skid floor wax.  Do not have throw rugs and other things on the floor that can make you trip. What can I do with my stairs?  Do not leave any items on the stairs.  Make sure that there are handrails on both sides of the stairs and use them. Fix handrails that are broken or loose. Make sure that handrails are as long as the stairways.  Check any carpeting to make sure that it is firmly attached to the stairs. Fix any carpet that is loose or worn.  Avoid having throw rugs at the top or bottom of the stairs. If you do have throw rugs, attach them to the floor with carpet tape.  Make sure that you have a light switch at the top of the stairs and the bottom of the stairs. If you do not have them, ask someone to add them  for you. What else can I do to help prevent falls?  Wear shoes that:  Do not have high heels.  Have rubber bottoms.  Are comfortable and fit you well.  Are closed at the toe. Do not wear sandals.  If you use a stepladder:  Make sure that it is fully opened. Do not climb a closed stepladder.  Make sure that both sides of the stepladder are locked into place.  Ask someone to hold it for you, if possible.  Clearly mark and make sure that you can see:  Any grab bars or handrails.  First and last steps.  Where the edge of each step is.  Use tools that help you move around (mobility aids) if they are needed. These include:  Canes.  Walkers.  Scooters.  Crutches.  Turn on the lights when you go into a dark area. Replace any light bulbs as soon as they burn out.  Set up your furniture so you have a clear path. Avoid moving your furniture around.  If any of your floors are uneven, fix them.  If there are any pets around you, be aware of where they are.  Review your medicines with your doctor. Some medicines can make you feel dizzy. This can increase your chance of falling. Ask your doctor what other things that you can do to help prevent falls. This information is not intended to replace advice given to you by your health care provider. Make sure you discuss any questions you have with your health care provider. Document Released: 01/04/2009 Document Revised: 08/16/2015 Document Reviewed: 04/14/2014 Elsevier Interactive Patient Education  2017 [employer].

## 2019-11-03 ENCOUNTER — Other Ambulatory Visit: Payer: Self-pay | Admitting: Family Medicine

## 2019-11-03 DIAGNOSIS — I10 Essential (primary) hypertension: Secondary | ICD-10-CM

## 2019-11-17 ENCOUNTER — Encounter: Payer: Self-pay | Admitting: *Deleted

## 2019-12-01 ENCOUNTER — Telehealth: Payer: Self-pay

## 2019-12-01 NOTE — Telephone Encounter (Signed)
Copied from [place] [phone] [phone]. Topic: General - Inquiry >> Nov 30, 2019  4:40 PM Mathis Bud wrote: Reason for CRM: Patient states she got a missed call back from office, Vm did not say who called or any detail.  Also not seeing anything in patients chart.  Let patient know I sent message back and if office needed anything they would call her back.  She is asking if she does not answer to leave a detailed message.  Call back [phone]

## 2020-01-16 ENCOUNTER — Encounter: Payer: Self-pay | Admitting: Family Medicine

## 2020-01-16 ENCOUNTER — Ambulatory Visit (INDEPENDENT_AMBULATORY_CARE_PROVIDER_SITE_OTHER): Payer: Medicare Other | Admitting: Family Medicine

## 2020-01-16 ENCOUNTER — Ambulatory Visit
Admission: RE | Admit: 2020-01-16 | Discharge: 2020-01-16 | Disposition: A | Discharge status: Home / Self Care | Payer: Medicare Other | Attending: Family Medicine | Admitting: Family Medicine

## 2020-01-16 ENCOUNTER — Other Ambulatory Visit: Payer: Self-pay

## 2020-01-16 ENCOUNTER — Ambulatory Visit
Admission: RE | Admit: 2020-01-16 | Discharge: 2020-01-16 | Disposition: A | Discharge status: Home / Self Care | Payer: Medicare Other | Source: Ambulatory Visit | Attending: Family Medicine | Admitting: Family Medicine

## 2020-01-16 VITALS — BP 145/85 | HR 61 | Temp 98.2°F | Wt 184.0 lb

## 2020-01-16 DIAGNOSIS — M25552 Pain in left hip: Secondary | ICD-10-CM | POA: Insufficient documentation

## 2020-01-16 DIAGNOSIS — F3340 Major depressive disorder, recurrent, in remission, unspecified: Secondary | ICD-10-CM

## 2020-01-16 DIAGNOSIS — E782 Mixed hyperlipidemia: Secondary | ICD-10-CM | POA: Diagnosis not present

## 2020-01-16 DIAGNOSIS — I10 Essential (primary) hypertension: Secondary | ICD-10-CM | POA: Diagnosis not present

## 2020-01-16 DIAGNOSIS — M1612 Unilateral primary osteoarthritis, left hip: Secondary | ICD-10-CM | POA: Diagnosis not present

## 2020-01-16 DIAGNOSIS — G479 Sleep disorder, unspecified: Secondary | ICD-10-CM

## 2020-01-16 DIAGNOSIS — D509 Iron deficiency anemia, unspecified: Secondary | ICD-10-CM | POA: Diagnosis not present

## 2020-01-16 DIAGNOSIS — E890 Postprocedural hypothyroidism: Secondary | ICD-10-CM | POA: Diagnosis not present

## 2020-01-16 MED ORDER — MIRTAZAPINE 15 MG PO TABS: 15.0000 mg | ORAL_TABLET | Freq: Every day | ORAL | 1 refills | Status: DC

## 2020-01-16 NOTE — Progress Notes (Signed)
Acute Office Visit  Subjective:    Patient ID: Melissa Fitzgerald, female    DOB: [birth year], 74 y.o.   MRN: [id number]  Chief Complaint  Patient presents with  . Groin Pain    HPI Patient is in today for leg cramps.  Patient states that she is actually having groin pain.  She states it has been going on for a couple of months.  She reports it comes and goes but does not get better.  It will at times radiate down her leg and into her foot.  But most of the time it is just in the groin.  She reports it does cause her to limp at times.   Hypothyroidism-Patient would also like to get her thyroid tested today.   Lab Results  Component Value Date   TSH 0.382 (L) 01/06/2019   T4TOTAL 8.3 01/06/2019     Past Medical History:  Diagnosis Date  . Anemia   . Colon polyp   . Depression   . Hyperlipidemia   . Hypertension   . Hypothyroidism   . Irritable bowel disease   . Kidney stone   . Thyroid disease     Past Surgical History:  Procedure Laterality Date  . ABDOMINAL HYSTERECTOMY    . BREAST ENHANCEMENT SURGERY    . BREAST SURGERY    . CHOLECYSTECTOMY  2000  . COLECTOMY  09/2013  . COLONOSCOPY  09-13-13   Dr [name]  . COLONOSCOPY WITH PROPOFOL N/A 10/18/2014   Procedure: COLONOSCOPY WITH PROPOFOL;  Surgeon: [name], MD;  Location: [facility];  Service: Endoscopy;  Laterality: N/A;  . CYSTOSCOPY/URETEROSCOPY/HOLMIUM LASER/STENT PLACEMENT Right 06/23/2017   Procedure: CYSTOSCOPY/URETEROSCOPY/HOLMIUM LASER/STENT PLACEMENT;  Surgeon: Abbie Sons, MD;  Location: [facility];  Service: Urology;  Laterality: Right;  . ESOPHAGOGASTRODUODENOSCOPY (EGD) WITH PROPOFOL N/A 01/03/2015   Procedure: ESOPHAGOGASTRODUODENOSCOPY (EGD) WITH PROPOFOL;  Surgeon: [name], MD;  Location: [facility];  Service: Endoscopy;  Laterality: N/A;  . EYE SURGERY     bilateral cataract   . FOOT SURGERY    . KIDNEY STONE SURGERY    . NASAL SINUS SURGERY    . PORT-A-CATH  REMOVAL    . PORTACATH PLACEMENT  10/24/13  . THYROID SURGERY    . TONSILLECTOMY      Family History  Problem Relation Age of Onset  . Heart disease Mother   . Diabetes Mother   . Arthritis Sister   . Hyperlipidemia Sister   . COPD Brother   . Diabetes Sister   . Colon cancer Other        first cousin  . Breast cancer Maternal Aunt   . Breast cancer Paternal Aunt     Social History   Socioeconomic History  . Marital status: Widowed    Spouse name: Not on file  . Number of children: 1  . Years of education: Not on file  . Highest education level: 12th grade  Occupational History  . Occupation: retired  Tobacco Use  . Smoking status: Current Some Day Smoker    Packs/day: 0.00    Types: Cigarettes    Last attempt to quit: 03/24/2016    Years since quitting: 3.8  . Smokeless tobacco: Never Used  . Tobacco comment: still smoking every now and then, rarely one  Vaping Use  . Vaping Use: Never used  Substance and Sexual Activity  . Alcohol use: Yes    Alcohol/week: 0.0 standard drinks    Comment: occasionally  .  Drug use: No  . Sexual activity: Yes  Other Topics Concern  . Not on file  Social History Narrative  . Not on file   Social Determinants of Health   Financial Resource Strain: Low Risk   . Difficulty of Paying Living Expenses: Not hard at all  Food Insecurity: No Food Insecurity  . Worried About Charity fundraiser in the Last Year: Never true  . Ran Out of Food in the Last Year: Never true  Transportation Needs: No Transportation Needs  . Lack of Transportation (Medical): No  . Lack of Transportation (Non-Medical): No  Physical Activity: Inactive  . Days of Exercise per Week: 0 days  . Minutes of Exercise per Session: 0 min  Stress: Stress Concern Present  . Feeling of Stress : To some extent  Social Connections: Socially Isolated  . Frequency of Communication with Friends and Family: More than three times a week  . Frequency of Social Gatherings with  Friends and Family: Three times a week  . Attends Religious Services: Never  . Active Member of Clubs or Organizations: No  . Attends Archivist Meetings: Never  . Marital Status: Widowed  Intimate Partner Violence: Not At Risk  . Fear of Current or Ex-Partner: No  . Emotionally Abused: No  . Physically Abused: No  . Sexually Abused: No    Outpatient Medications Prior to Visit  Medication Sig Dispense Refill  . aspirin-acetaminophen-caffeine (EXCEDRIN MIGRAINE) 250-250-65 MG tablet Take 2 tablets by mouth 2 (two) times daily as needed for headache or migraine. 30 tablet 0  . carboxymethylcellulose (REFRESH PLUS) 0.5 % SOLN Place 1 drop into both eyes daily as needed (dry eyes).     . hydrochlorothiazide (HYDRODIURIL) 25 MG tablet Take 1 tablet by mouth once daily 90 tablet 0  . levothyroxine (SYNTHROID) 100 MCG tablet TAKE ONE TABLET BY MOUTH ONCE DAILY BEFORE BREAKFAST 90 tablet 3  . metoprolol succinate (TOPROL-XL) 100 MG 24 hr tablet TAKE 1 TABLET BY MOUTH ONCE DAILY. TAKE WITH OR IMMEDIATELY FOLLOWING A MEAL. 90 tablet 3  . mirtazapine (REMERON) 7.5 MG tablet Take 1 tablet (7.5 mg total) by mouth at bedtime. 90 tablet 3  . Ferrous Sulfate 28 MG TABS Take 28 mg by mouth daily. (Patient not taking: Reported on 11/02/2019)    . Menthol, Topical Analgesic, (BIOFREEZE EX) Apply 1 application topically daily as needed (pain). (Patient not taking: Reported on 01/16/2020)    . ondansetron (ZOFRAN) 4 MG tablet Take 1 tablet (4 mg total) by mouth every 8 (eight) hours as needed for nausea or vomiting. (Patient not taking: Reported on 01/16/2020) 60 tablet 0  . predniSONE (DELTASONE) 5 MG tablet Taper down dosage by 1 tablet daily by mouth starting at 6 day 1, 5 day 2, 4 day 3, 3 day 4, 2 day 5 and 1 day 6. Divide tablets among meals and bedtime. (Patient not taking: Reported on 11/02/2019) 21 tablet 0   No facility-administered medications prior to visit.    Allergies  Allergen  Reactions  . Codeine Nausea And Vomiting  . Oxycodone Nausea And Vomiting  . Penicillins Nausea And Vomiting    Has patient had a PCN reaction causing immediate rash, facial/tongue/throat swelling, SOB or lightheadedness with hypotension: No Has patient had a PCN reaction causing severe rash involving mucus membranes or skin necrosis: No Has patient had a PCN reaction that required hospitalization: No Has patient had a PCN reaction occurring within the last 10 years: No If  all of the above answers are "NO", then may proceed with Cephalosporin use.   . Tramadol Nausea And Vomiting  . Trintellix [Vortioxetine] Itching    Review of Systems  Musculoskeletal: Positive for gait problem and myalgias. Negative for arthralgias, back pain, joint swelling, neck pain and neck stiffness.       Objective:    Physical Exam Constitutional:      General: She is not in acute distress.    Appearance: She is well-developed.  HENT:     Head: Normocephalic and atraumatic.     Right Ear: Hearing normal.     Left Ear: Hearing normal.     Nose: Nose normal.  Eyes:     General: Lids are normal. No scleral icterus.       Right eye: No discharge.        Left eye: No discharge.     Conjunctiva/sclera: Conjunctivae normal.  Cardiovascular:     Rate and Rhythm: Normal rate and regular rhythm.     Heart sounds: Normal heart sounds.  Pulmonary:     Effort: Pulmonary effort is normal. No respiratory distress.     Breath sounds: Normal breath sounds.  Abdominal:     General: Bowel sounds are normal.     Palpations: Abdomen is soft.  Musculoskeletal:        General: Normal range of motion.     Cervical back: Neck supple.  Skin:    Findings: No lesion or rash.  Neurological:     Mental Status: She is alert and oriented to person, place, and time.  Psychiatric:        Speech: Speech normal.        Behavior: Behavior normal.        Thought Content: Thought content normal.     BP (!) 145/85 (BP  Location: Right Arm, Patient Position: Sitting, Cuff Size: Normal)   Pulse 61   Temp 98.2 F (36.8 C) (Oral)   Wt 184 lb (83.5 kg)   SpO2 100%   BMI 29.70 kg/m  Wt Readings from Last 3 Encounters:  01/16/20 184 lb (83.5 kg)  09/13/19 184 lb 6.4 oz (83.6 kg)  05/03/19 187 lb (84.8 kg)   BP Readings from Last 3 Encounters:  01/16/20 (!) 145/85  09/13/19 124/74  05/03/19 (!) 146/82     Health Maintenance Due  Topic Date Due  . MAMMOGRAM  Never done  . COLONOSCOPY  10/17/2017  . INFLUENZA VACCINE  10/23/2019    There are no preventive care reminders to display for this patient.   Lab Results  Component Value Date   TSH 0.382 (L) 01/06/2019   Lab Results  Component Value Date   WBC 4.6 01/06/2019   HGB 10.6 (L) 01/06/2019   HCT 33.5 (L) 01/06/2019   MCV 93 01/06/2019   PLT 265 01/06/2019   Lab Results  Component Value Date   NA 140 01/06/2019   K 3.9 01/06/2019   CO2 25 01/06/2019   GLUCOSE 89 01/06/2019   BUN 19 01/06/2019   CREATININE 1.06 (H) 01/06/2019   BILITOT 0.4 01/06/2019   ALKPHOS 80 01/06/2019   AST 19 01/06/2019   ALT 12 01/06/2019   PROT 7.8 01/06/2019   ALBUMIN 4.4 01/06/2019   CALCIUM 9.5 01/06/2019   ANIONGAP 7 06/25/2017   Lab Results  Component Value Date   CHOL 213 (H) 06/02/2018   Lab Results  Component Value Date   HDL 68 06/02/2018   Lab Results  Component Value Date   LDLCALC 128 (H) 06/02/2018   Lab Results  Component Value Date   TRIG 87 06/02/2018   Lab Results  Component Value Date   CHOLHDL 3.1 06/02/2018   No results found for: HGBA1C     Assessment & Plan:   1. Left hip pain Intermittent pains/cramps in the left hips over the past several weeks. No known injury. Moist heat some relief but no help with Diclofenac tablets. Check x-ray of hip and may use Ibuprofen. - DG Hip Unilat W OR W/O Pelvis 2-3 Views Left  2. Recurrent major depressive disorder, in remission ([place]) No suicidal ideation but still has  some anxiety with depressed mood. Will increase Remeron and recheck routine labs. - mirtazapine (REMERON) 15 MG tablet; Take 1 tablet (15 mg total) by mouth at bedtime.  Dispense: 90 tablet; Refill: 1 - CBC with Differential/Platelet - Comprehensive metabolic panel - TSH  3. Sleep disturbance Requests a little more Remeron to help sleep disturbance. This helped establish a more restful sleep initially. - mirtazapine (REMERON) 15 MG tablet; Take 1 tablet (15 mg total) by mouth at bedtime.  Dispense: 90 tablet; Refill: 1  4. Essential (primary) hypertension BP borderline today. Still taking HCTZ 25 mg qd, Metoprolol Succinate 100 mg qd and restricting salt intake. No chest pains, palpitations or peripheral edema. Recheck labs. - CBC with Differential/Platelet - Comprehensive metabolic panel - Lipid panel - TSH  5. Hypothyroidism, postop Tolerating Levothyroxine 100 mcg qd. No palpitations or hair loss. No exophthalmos. Recheck labs. - CBC with Differential/Platelet - TSH - T4  6. Mixed hyperlipidemia Trying to follow low fat diet. Will recheck labs. - Comprehensive metabolic panel - Lipid panel - TSH  7. Iron deficiency anemia, unspecified iron deficiency anemia type Not taking iron supplement every day now. Recheck CBC. - CBC with Differential/Platelet   No orders of the defined types were placed in this encounter.  Andres Shad, PA, have reviewed all documentation for this visit. The documentation on 01/16/20 for the exam, diagnosis, procedures, and orders are all accurate and complete.   Juluis Mire, CMA

## 2020-01-17 ENCOUNTER — Other Ambulatory Visit: Payer: Self-pay | Admitting: Family Medicine

## 2020-01-17 ENCOUNTER — Other Ambulatory Visit: Payer: Self-pay

## 2020-01-17 DIAGNOSIS — E890 Postprocedural hypothyroidism: Secondary | ICD-10-CM

## 2020-01-17 DIAGNOSIS — M25552 Pain in left hip: Secondary | ICD-10-CM

## 2020-01-17 LAB — COMPREHENSIVE METABOLIC PANEL
ALT: 16 IU/L (ref 0–32)
AST: 20 IU/L (ref 0–40)
Albumin/Globulin Ratio: 1.3 (ref 1.2–2.2)
Albumin: 4.4 g/dL (ref 3.7–4.7)
Alkaline Phosphatase: 75 IU/L (ref 44–121)
BUN/Creatinine Ratio: 21 (ref 12–28)
BUN: 22 mg/dL (ref 8–27)
Bilirubin Total: 0.4 mg/dL (ref 0.0–1.2)
CO2: 25 mmol/L (ref 20–29)
Calcium: 9.2 mg/dL (ref 8.7–10.3)
Chloride: 103 mmol/L (ref 96–106)
Creatinine, Ser: 1.06 mg/dL — ABNORMAL HIGH (ref 0.57–1.00)
GFR calc Af Amer: 60 mL/min/{1.73_m2} (ref >59)
GFR calc non Af Amer: 52 mL/min/{1.73_m2} — ABNORMAL LOW (ref >59)
Globulin, Total: 3.4 g/dL (ref 1.5–4.5)
Glucose: 91 mg/dL (ref 65–99)
Potassium: 3.8 mmol/L (ref 3.5–5.2)
Sodium: 141 mmol/L (ref 134–144)
Total Protein: 7.8 g/dL (ref 6.0–8.5)

## 2020-01-17 LAB — CBC WITH DIFFERENTIAL/PLATELET
Basophils Absolute: 0 10*3/uL (ref 0.0–0.2)
Basos: 1 %
EOS (ABSOLUTE): 0.1 10*3/uL (ref 0.0–0.4)
Eos: 1 %
Hematocrit: 33.3 % — ABNORMAL LOW (ref 34.0–46.6)
Hemoglobin: 10.6 g/dL — ABNORMAL LOW (ref 11.1–15.9)
Immature Grans (Abs): 0 10*3/uL (ref 0.0–0.1)
Immature Granulocytes: 0 %
Lymphocytes Absolute: 1 10*3/uL (ref 0.7–3.1)
Lymphs: 21 %
MCH: 29.7 pg (ref 26.6–33.0)
MCHC: 31.8 g/dL (ref 31.5–35.7)
MCV: 93 fL (ref 79–97)
Monocytes Absolute: 0.3 10*3/uL (ref 0.1–0.9)
Monocytes: 6 %
Neutrophils Absolute: 3.4 10*3/uL (ref 1.4–7.0)
Neutrophils: 71 %
Platelets: 239 10*3/uL (ref 150–450)
RBC: 3.57 x10E6/uL — ABNORMAL LOW (ref 3.77–5.28)
RDW: 13.3 % (ref 11.7–15.4)
WBC: 4.8 10*3/uL (ref 3.4–10.8)

## 2020-01-17 LAB — T4: T4, Total: 6.5 ug/dL (ref 4.5–12.0)

## 2020-01-17 LAB — LIPID PANEL
Chol/HDL Ratio: 3.6 ratio (ref 0.0–4.4)
Cholesterol, Total: 239 mg/dL — ABNORMAL HIGH (ref 100–199)
HDL: 67 mg/dL (ref >39)
LDL Chol Calc (NIH): 154 mg/dL — ABNORMAL HIGH (ref 0–99)
Triglycerides: 103 mg/dL (ref 0–149)
VLDL Cholesterol Cal: 18 mg/dL (ref 5–40)

## 2020-01-17 LAB — TSH: TSH: 5.74 u[IU]/mL — ABNORMAL HIGH (ref 0.450–4.500)

## 2020-01-17 MED ORDER — PRAVASTATIN SODIUM 20 MG PO TABS: 20.0000 mg | ORAL_TABLET | Freq: Every day | ORAL | 3 refills | Status: DC

## 2020-01-17 MED ORDER — LEVOTHYROXINE SODIUM 112 MCG PO TABS: ORAL_TABLET | ORAL | 3 refills | Status: DC

## 2020-01-19 DIAGNOSIS — H35033 Hypertensive retinopathy, bilateral: Secondary | ICD-10-CM | POA: Diagnosis not present

## 2020-01-19 DIAGNOSIS — H524 Presbyopia: Secondary | ICD-10-CM | POA: Diagnosis not present

## 2020-01-31 DIAGNOSIS — M5416 Radiculopathy, lumbar region: Secondary | ICD-10-CM | POA: Diagnosis not present

## 2020-04-02 ENCOUNTER — Ambulatory Visit: Payer: Medicare Other | Attending: Internal Medicine

## 2020-04-02 DIAGNOSIS — Z23 Encounter for immunization: Secondary | ICD-10-CM

## 2020-04-02 NOTE — Progress Notes (Signed)
   Covid-19 Vaccination Clinic  Name:  [name]    MRN: [id number] DOB: 1945-11-06  04/02/2020  [name] was observed post Covid-19 immunization for 15 minutes without incident. She was provided with Vaccine Information Sheet and instruction to access the V-Safe system.   [name] was instructed to call 911 with any severe reactions post vaccine: Marland Kitchen Difficulty breathing  . Swelling of face and throat  . A fast heartbeat  . A bad rash all over body  . Dizziness and weakness   Immunizations Administered    Name Date Dose VIS Date Route   Pfizer COVID-19 Vaccine 04/02/2020  1:13 PM 0.3 mL 01/11/2020 Intramuscular   Manufacturer: [place]   Lot: [id number]   NDC: [address]-4

## 2020-07-30 ENCOUNTER — Encounter: Payer: Self-pay | Admitting: Family Medicine

## 2020-07-30 ENCOUNTER — Ambulatory Visit (INDEPENDENT_AMBULATORY_CARE_PROVIDER_SITE_OTHER): Payer: Medicare Other | Admitting: Family Medicine

## 2020-07-30 ENCOUNTER — Other Ambulatory Visit: Payer: Self-pay

## 2020-07-30 DIAGNOSIS — Z7189 Other specified counseling: Secondary | ICD-10-CM

## 2020-07-30 DIAGNOSIS — Z710 Person encountering health services to consult on behalf of another person: Secondary | ICD-10-CM

## 2020-07-30 NOTE — Progress Notes (Signed)
Established patient visit   Patient: Melissa Fitzgerald   DOB: [birth year]   75 y.o. Female  MRN: [id number] Visit Date: 07/30/2020  Today's healthcare provider: Vernie Murders, PA-C   No chief complaint on file.  Subjective    HPI  Patient's daughter Lattie Haw presents without patient to discuss the patient's mental health.  It is confirmed that Lattie Haw is on the patient's DPR.  Past Medical History:  Diagnosis Date  . Anemia   . Colon polyp   . Depression   . Hyperlipidemia   . Hypertension   . Hypothyroidism   . Irritable bowel disease   . Kidney stone   . Thyroid disease    Past Surgical History:  Procedure Laterality Date  . ABDOMINAL HYSTERECTOMY    . BREAST ENHANCEMENT SURGERY    . BREAST SURGERY    . CHOLECYSTECTOMY  2000  . COLECTOMY  09/2013  . COLONOSCOPY  09-13-13   Dr [name]  . COLONOSCOPY WITH PROPOFOL N/A 10/18/2014   Procedure: COLONOSCOPY WITH PROPOFOL;  Surgeon: [name], MD;  Location: [facility];  Service: Endoscopy;  Laterality: N/A;  . CYSTOSCOPY/URETEROSCOPY/HOLMIUM LASER/STENT PLACEMENT Right 06/23/2017   Procedure: CYSTOSCOPY/URETEROSCOPY/HOLMIUM LASER/STENT PLACEMENT;  Surgeon: Abbie Sons, MD;  Location: [facility];  Service: Urology;  Laterality: Right;  . ESOPHAGOGASTRODUODENOSCOPY (EGD) WITH PROPOFOL N/A 01/03/2015   Procedure: ESOPHAGOGASTRODUODENOSCOPY (EGD) WITH PROPOFOL;  Surgeon: [name], MD;  Location: [facility];  Service: Endoscopy;  Laterality: N/A;  . EYE SURGERY     bilateral cataract   . FOOT SURGERY    . KIDNEY STONE SURGERY    . NASAL SINUS SURGERY    . PORT-A-CATH REMOVAL    . PORTACATH PLACEMENT  10/24/13  . THYROID SURGERY    . TONSILLECTOMY     Social History   Tobacco Use  . Smoking status: Current Some Day Smoker    Packs/day: 0.00    Types: Cigarettes    Last attempt to quit: 03/24/2016    Years since quitting: 4.3  . Smokeless tobacco: Never Used  . Tobacco comment: still smoking  every now and then, rarely one  Vaping Use  . Vaping Use: Never used  Substance Use Topics  . Alcohol use: Yes    Alcohol/week: 0.0 standard drinks    Comment: occasionally  . Drug use: No   Family History  Problem Relation Age of Onset  . Heart disease Mother   . Diabetes Mother   . Arthritis Sister   . Hyperlipidemia Sister   . COPD Brother   . Diabetes Sister   . Colon cancer Other        first cousin  . Breast cancer Maternal Aunt   . Breast cancer Paternal Aunt    Allergies  Allergen Reactions  . Codeine Nausea And Vomiting  . Oxycodone Nausea And Vomiting  . Penicillins Nausea And Vomiting    Has patient had a PCN reaction causing immediate rash, facial/tongue/throat swelling, SOB or lightheadedness with hypotension: No Has patient had a PCN reaction causing severe rash involving mucus membranes or skin necrosis: No Has patient had a PCN reaction that required hospitalization: No Has patient had a PCN reaction occurring within the last 10 years: No If all of the above answers are "NO", then may proceed with Cephalosporin use.   . Tramadol Nausea And Vomiting  . Trintellix [Vortioxetine] Itching       Medications: Outpatient Medications Prior to Visit  Medication Sig  .  aspirin-acetaminophen-caffeine (EXCEDRIN MIGRAINE) 250-250-65 MG tablet Take 2 tablets by mouth 2 (two) times daily as needed for headache or migraine.  . carboxymethylcellulose (REFRESH PLUS) 0.5 % SOLN Place 1 drop into both eyes daily as needed (dry eyes).   . Ferrous Sulfate 28 MG TABS Take 28 mg by mouth daily. (Patient not taking: Reported on 11/02/2019)  . hydrochlorothiazide (HYDRODIURIL) 25 MG tablet Take 1 tablet by mouth once daily  . levothyroxine (SYNTHROID) 112 MCG tablet TAKE ONE TABLET BY MOUTH ONCE DAILY BEFORE BREAKFAST  . Menthol, Topical Analgesic, (BIOFREEZE EX) Apply 1 application topically daily as needed (pain). (Patient not taking: Reported on 01/16/2020)  . metoprolol  succinate (TOPROL-XL) 100 MG 24 hr tablet TAKE 1 TABLET BY MOUTH ONCE DAILY. TAKE WITH OR IMMEDIATELY FOLLOWING A MEAL.  . mirtazapine (REMERON) 15 MG tablet Take 1 tablet (15 mg total) by mouth at bedtime.  . ondansetron (ZOFRAN) 4 MG tablet Take 1 tablet (4 mg total) by mouth every 8 (eight) hours as needed for nausea or vomiting. (Patient not taking: Reported on 01/16/2020)  . pravastatin (PRAVACHOL) 20 MG tablet Take 1 tablet (20 mg total) by mouth daily.  . predniSONE (DELTASONE) 5 MG tablet Taper down dosage by 1 tablet daily by mouth starting at 6 day 1, 5 day 2, 4 day 3, 3 day 4, 2 day 5 and 1 day 6. Divide tablets among meals and bedtime. (Patient not taking: Reported on 11/02/2019)   No facility-administered medications prior to visit.         Objective    There were no vitals taken for this visit.    No results found for any visits on 07/30/20.  Assessment & Plan     1. Other specified counseling Counseling with daughter of patient reporting she has begun to repeat herself frequently. Family has a history of dementia. Daughter is a retired Engineer, structural and worries about mental illness, also. Recommend scheduling appointment with patient to do a depression and mental status screening. May need neurology or psychiatry referral if use of antidepressant working for her. Counseling for 25-30 minutes.   No follow-ups on file.         Vernie Murders, PA-C  [employer] [phone] (phone) [phone] (fax)  [place]

## 2020-08-22 ENCOUNTER — Telehealth: Payer: Self-pay

## 2020-08-22 NOTE — Telephone Encounter (Signed)
Copied from [place] [id number]. Topic: General - Other >> Aug 22, 2020 10:46 AM Loma [place] wrote: Pt returned call for [facility] call for resch on 8/17 pt says to call back maybe Fri, nxt couple days she is very busy. FU with pt ok at [phone]

## 2020-09-06 ENCOUNTER — Ambulatory Visit: Payer: Medicare Other | Admitting: Family Medicine

## 2020-09-13 ENCOUNTER — Other Ambulatory Visit: Payer: Self-pay | Admitting: Family Medicine

## 2020-09-13 DIAGNOSIS — F3340 Major depressive disorder, recurrent, in remission, unspecified: Secondary | ICD-10-CM

## 2020-09-13 DIAGNOSIS — G479 Sleep disorder, unspecified: Secondary | ICD-10-CM

## 2020-09-13 DIAGNOSIS — I10 Essential (primary) hypertension: Secondary | ICD-10-CM

## 2020-09-13 NOTE — Telephone Encounter (Signed)
Notes to clinic: scripts requested are expired  Review for continued use and refill    Requested Prescriptions  Pending Prescriptions Disp Refills   metoprolol succinate (TOPROL-XL) 100 MG 24 hr tablet [Pharmacy Med Name: Metoprolol Succinate ER 100 MG Oral Tablet Extended Release 24 Hour] 90 tablet 0    Sig: TAKE 1 TABLET BY MOUTH ONCE DAILY WITH  OR  IMMEDIATELY  FOLLOWING  A  MEAL      Cardiovascular:  Beta Blockers Failed - 09/13/2020 11:28 AM      Failed - Last BP in normal range    BP Readings from Last 1 Encounters:  01/16/20 (!) 145/85          Passed - Last Heart Rate in normal range    Pulse Readings from Last 1 Encounters:  01/16/20 61          Passed - Valid encounter within last 6 months    Recent Outpatient Visits           1 month ago Other specified counseling   [facility], Vickki Muff, PA-C   8 months ago Left hip pain   [facility], Vickki Muff, PA-C   1 year ago Arthritis of both hands   [facility], Vickki Muff, PA-C   1 year ago Recurrent major depressive disorder, in remission ([place])   [place], Vickki Muff, PA-C   1 year ago Recurrent major depressive disorder, in remission ([place])   [place], Vickki Muff, PA-C                  mirtazapine (REMERON) 15 MG tablet [Pharmacy Med Name: Mirtazapine 15 MG Oral Tablet] 90 tablet 0    Sig: TAKE 1 TABLET BY MOUTH AT BEDTIME      Psychiatry: Antidepressants - mirtazapine Failed - 09/13/2020 11:28 AM      Failed - Total Cholesterol in normal range and within 360 days    Cholesterol, Total  Date Value Ref Range Status  01/16/2020 239 (H) 100 - 199 mg/dL Final          Passed - AST in normal range and within 360 days    AST  Date Value Ref Range Status  01/16/2020 20 0 - 40 IU/L Final   SGOT(AST)  Date Value Ref Range Status  04/19/2014 14 (L) 15 - 37 Unit/L Final          Passed  - ALT in normal range and within 360 days    ALT  Date Value Ref Range Status  01/16/2020 16 0 - 32 IU/L Final   SGPT (ALT)  Date Value Ref Range Status  04/19/2014 18 14 - 63 U/L Final          Passed - Triglycerides in normal range and within 360 days    Triglycerides  Date Value Ref Range Status  01/16/2020 103 0 - 149 mg/dL Final          Passed - WBC in normal range and within 360 days    WBC  Date Value Ref Range Status  01/16/2020 4.8 3.4 - 10.8 x10E3/uL Final  06/25/2017 6.7 3.6 - 11.0 K/uL Final          Passed - Completed PHQ-2 or PHQ-9 in the last 360 days      Passed - Valid encounter within last 6 months    Recent Outpatient Visits  1 month ago Other specified counseling   [place], PA-C   8 months ago Left hip pain   [facility], Vickki Muff, PA-C   1 year ago Arthritis of both hands   [facility], Vickki Muff, PA-C   1 year ago Recurrent major depressive disorder, in remission [facility])   [facility], Vickki Muff, PA-C   1 year ago Recurrent major depressive disorder, in remission [facility])   [facility], Vickki Muff, PA-C                  hydrochlorothiazide (HYDRODIURIL) 25 MG tablet [Pharmacy Med Name: hydroCHLOROthiazide 25 MG Oral Tablet] 90 tablet 0    Sig: Take 1 tablet by mouth once daily      Cardiovascular: Diuretics - Thiazide Failed - 09/13/2020 11:28 AM      Failed - Cr in normal range and within 360 days    Creat  Date Value Ref Range Status  12/09/2016 0.77 0.60 - 0.93 mg/dL Final    Comment:    For patients >35 years of age, the reference limit for Creatinine is approximately 13% higher for people identified as African-American. .    Creatinine, Ser  Date Value Ref Range Status  01/16/2020 1.06 (H) 0.57 - 1.00 mg/dL Final          Failed - Last BP in normal range    BP Readings from Last 1  Encounters:  01/16/20 (!) 145/85          Passed - Ca in normal range and within 360 days    Calcium  Date Value Ref Range Status  01/16/2020 9.2 8.7 - 10.3 mg/dL Final   Calcium, Total  Date Value Ref Range Status  04/19/2014 8.3 (L) 8.5 - 10.1 mg/dL Final          Passed - K in normal range and within 360 days    Potassium  Date Value Ref Range Status  01/16/2020 3.8 3.5 - 5.2 mmol/L Final  04/19/2014 3.4 (L) 3.5 - 5.1 mmol/L Final          Passed - Na in normal range and within 360 days    Sodium  Date Value Ref Range Status  01/16/2020 141 134 - 144 mmol/L Final  04/19/2014 146 (H) 136 - 145 mmol/L Final          Passed - Valid encounter within last 6 months    Recent Outpatient Visits           1 month ago Other specified counseling   [place], PA-C   8 months ago Left hip pain   [place], PA-C   1 year ago Arthritis of both hands   [facility], Vickki Muff, PA-C   1 year ago Recurrent major depressive disorder, in remission [facility])   [facility], Vickki Muff, PA-C   1 year ago Recurrent major depressive disorder, in remission [facility])   [facility], Vickki Muff, [place]

## 2020-09-21 IMAGING — CR DG HAND COMPLETE 3+V*L*
1 series · 3 of 3 positions shown · non-contrast
Comparison: No prior.

CLINICAL DATA: Numbness and pain in hands.  No known injury.

EXAM:
LEFT HAND - COMPLETE 3+ VIEW

[Series 1: dg hand complete left · 0.14mm/px · 3 of 3 slices shown]
[im 1/3]
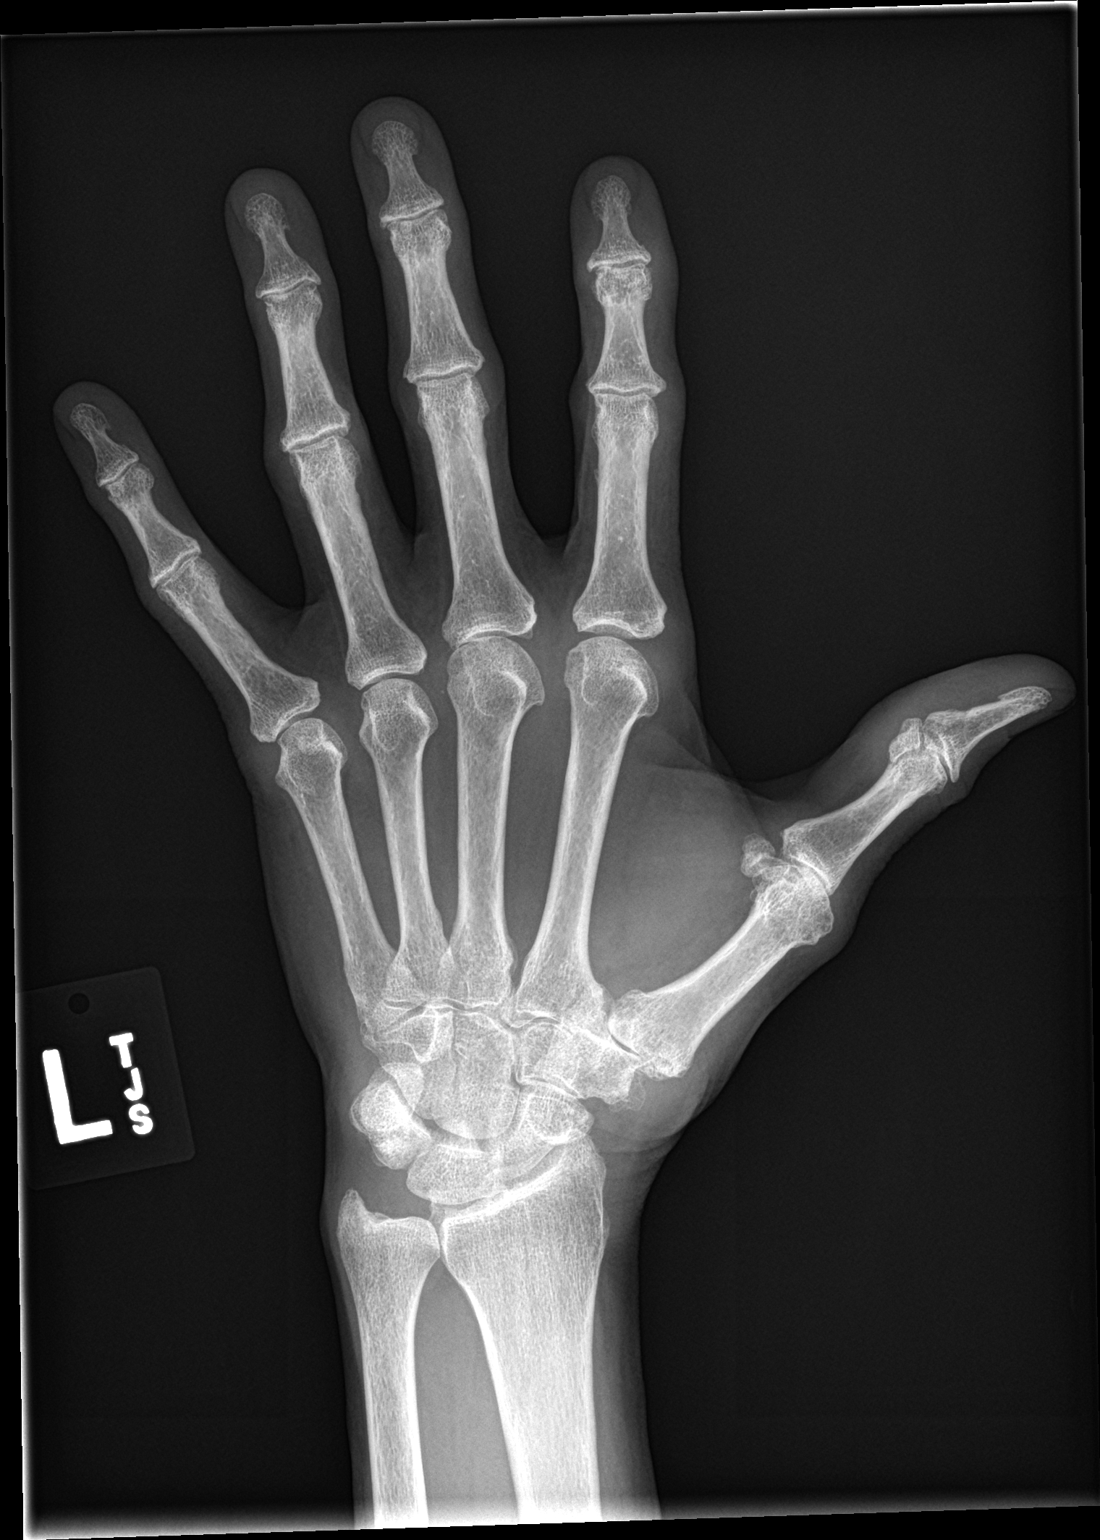
[im 2/3]
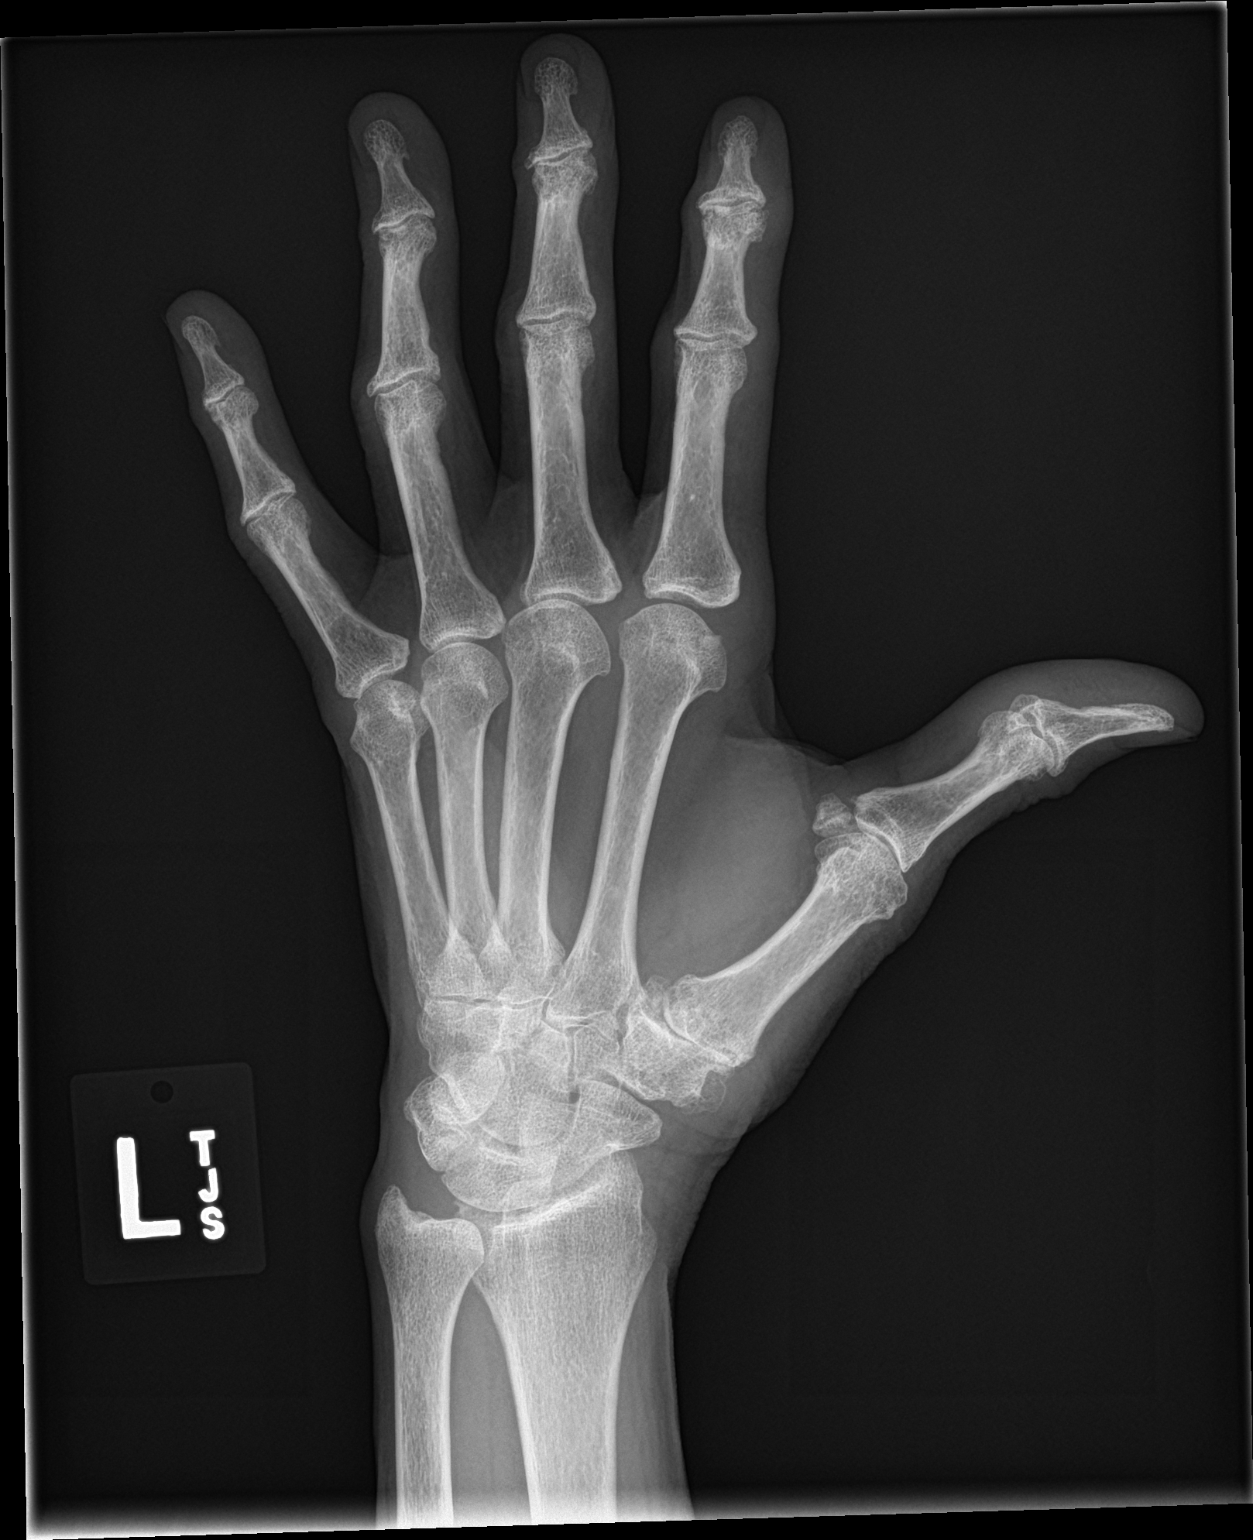
[im 3/3]
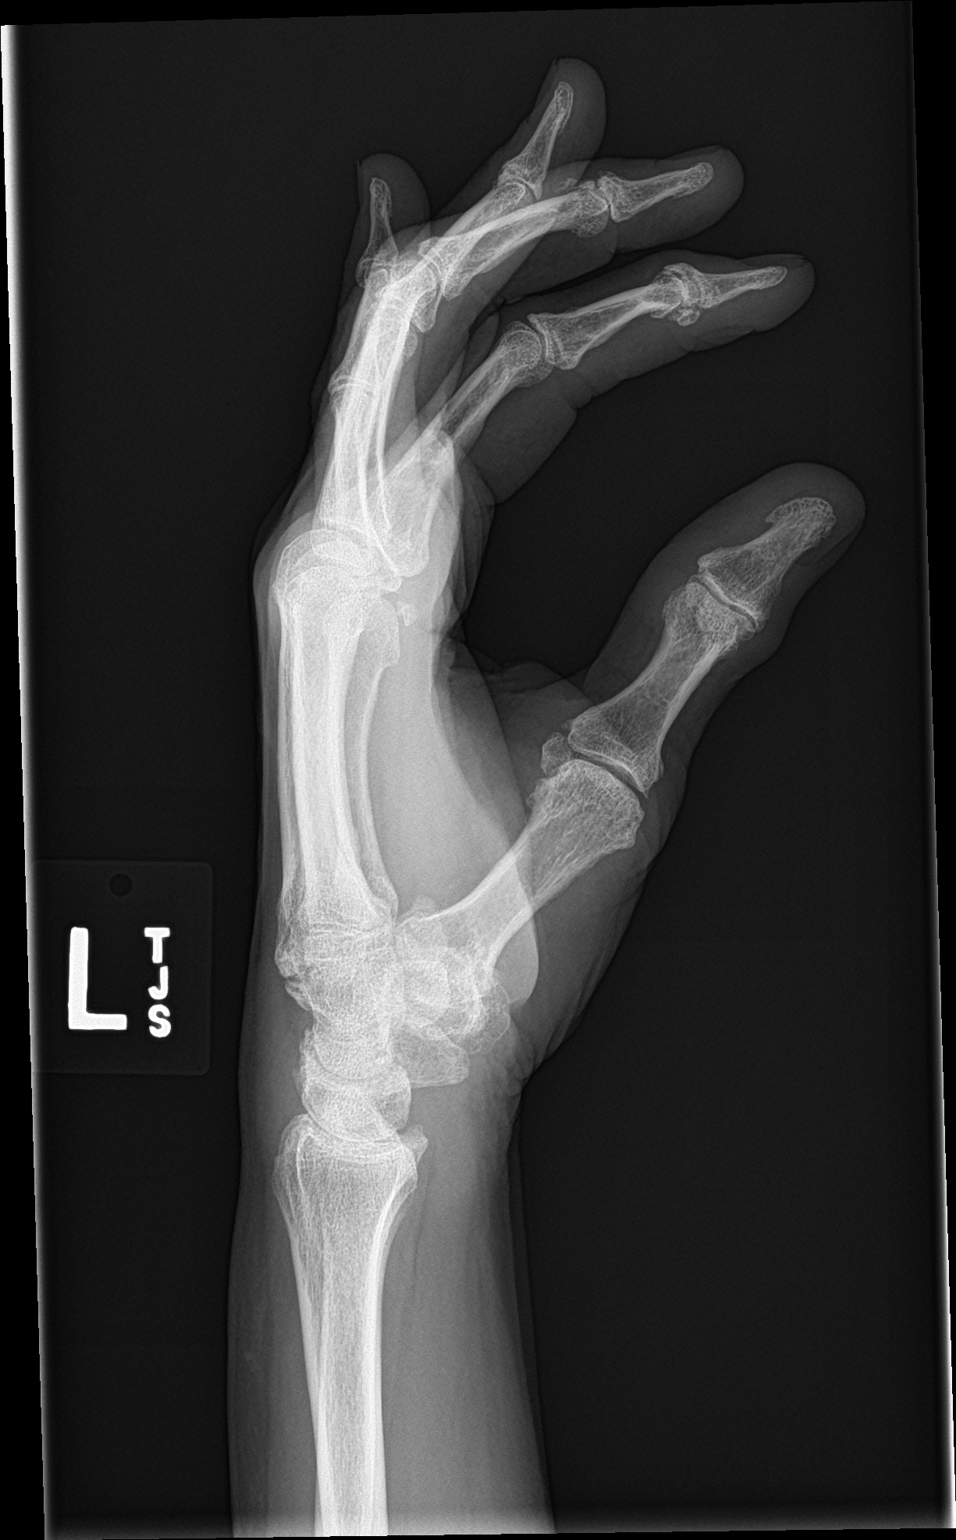

[3 of 3 positions shown; findings below may reference images not displayed]

FINDINGS: Diffuse degenerative change, most prominent about the radiocarpal
and first Gerasius metacarpal joints. No evidence of erosive
arthropathy. No acute bony or joint abnormality. No evidence of
fracture or dislocation. No radiopaque foreign body.
IMPRESSION: Diffuse degenerative change, most prominent about the radiocarpal
and first carpometacarpal joints. No evidence of erosive
arthropathy. No acute bony abnormality identified.

## 2020-09-21 IMAGING — CR DG HAND COMPLETE 3+V*R*
1 series · 3 of 3 positions shown · non-contrast
Comparison: No prior.

CLINICAL DATA: Bilateral hand pain.  No known injury.

EXAM:
RIGHT HAND - COMPLETE 3+ VIEW

[Series 1: dg hand complete right · 0.14mm/px · 3 of 3 slices shown]
[im 1/3]
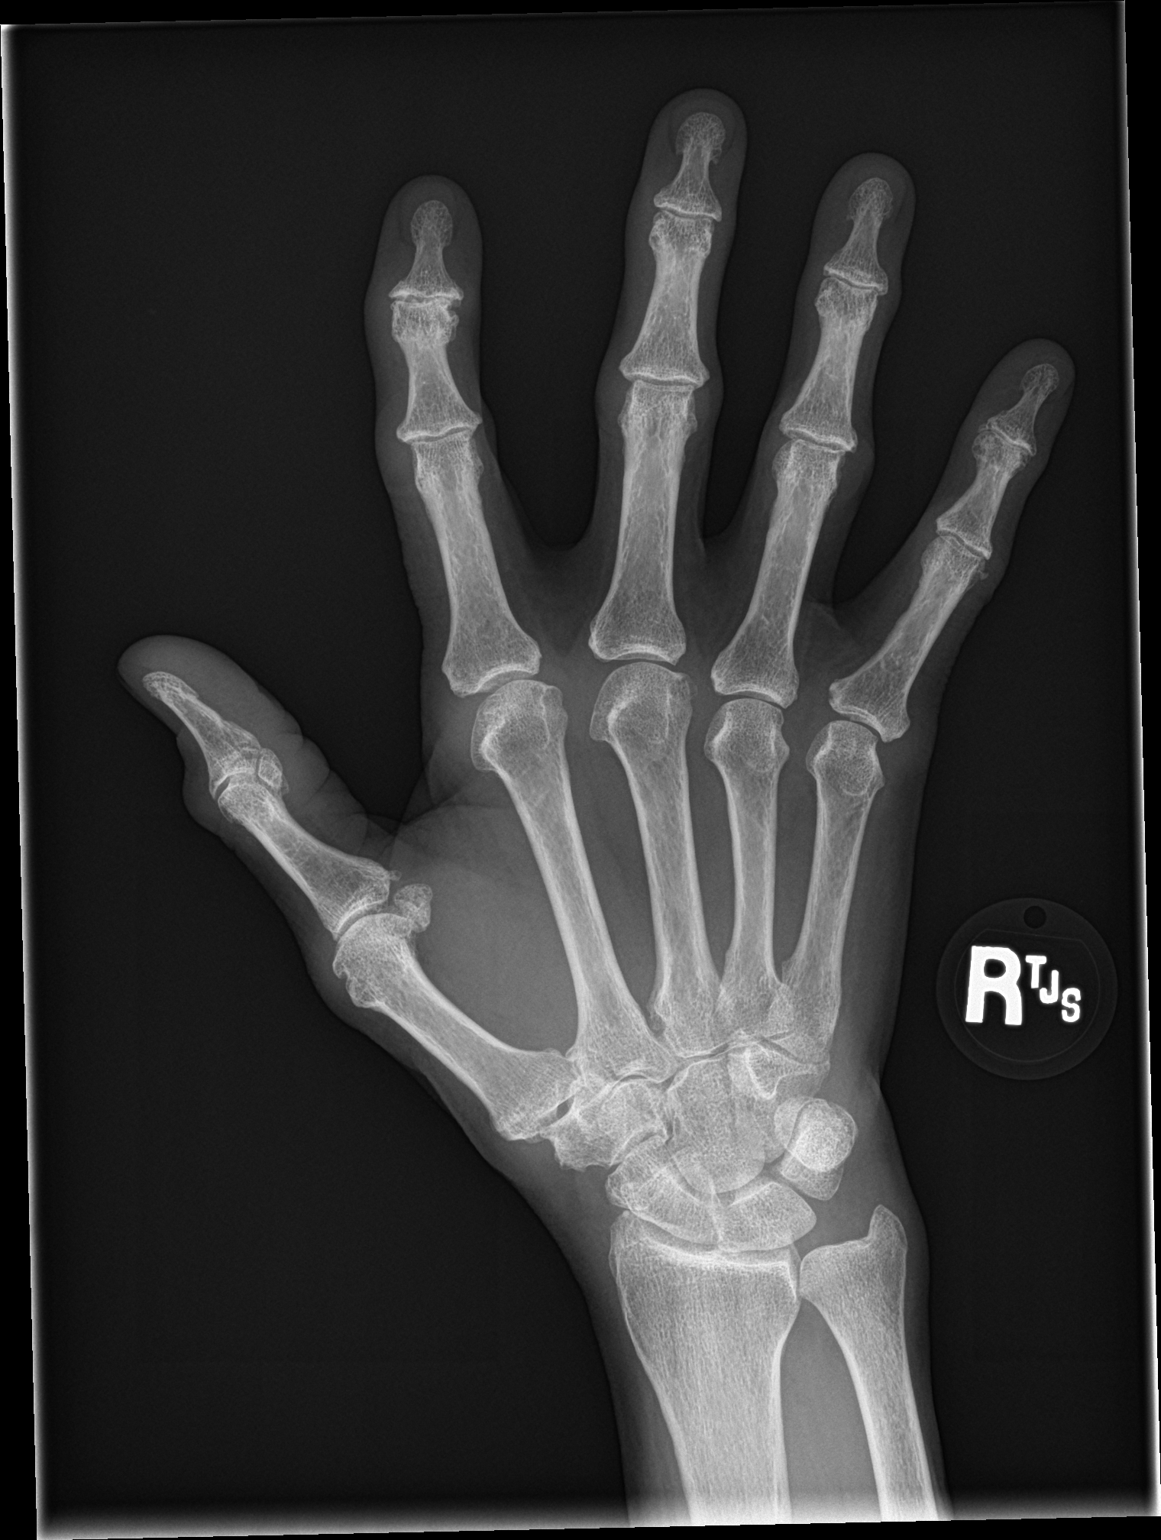
[im 2/3]
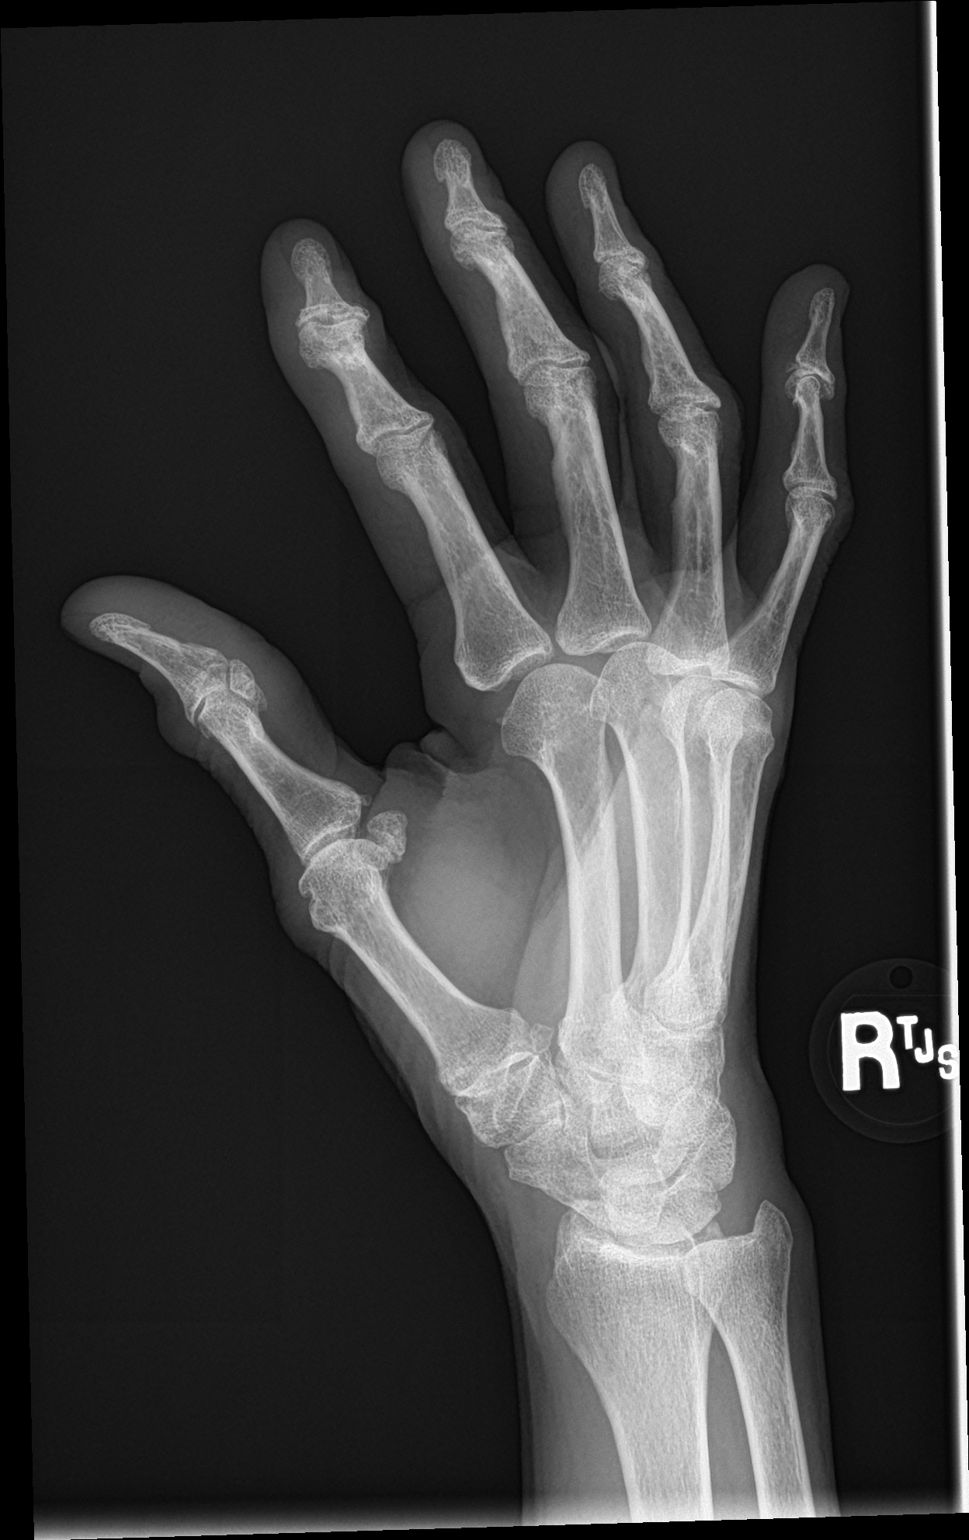
[im 3/3]
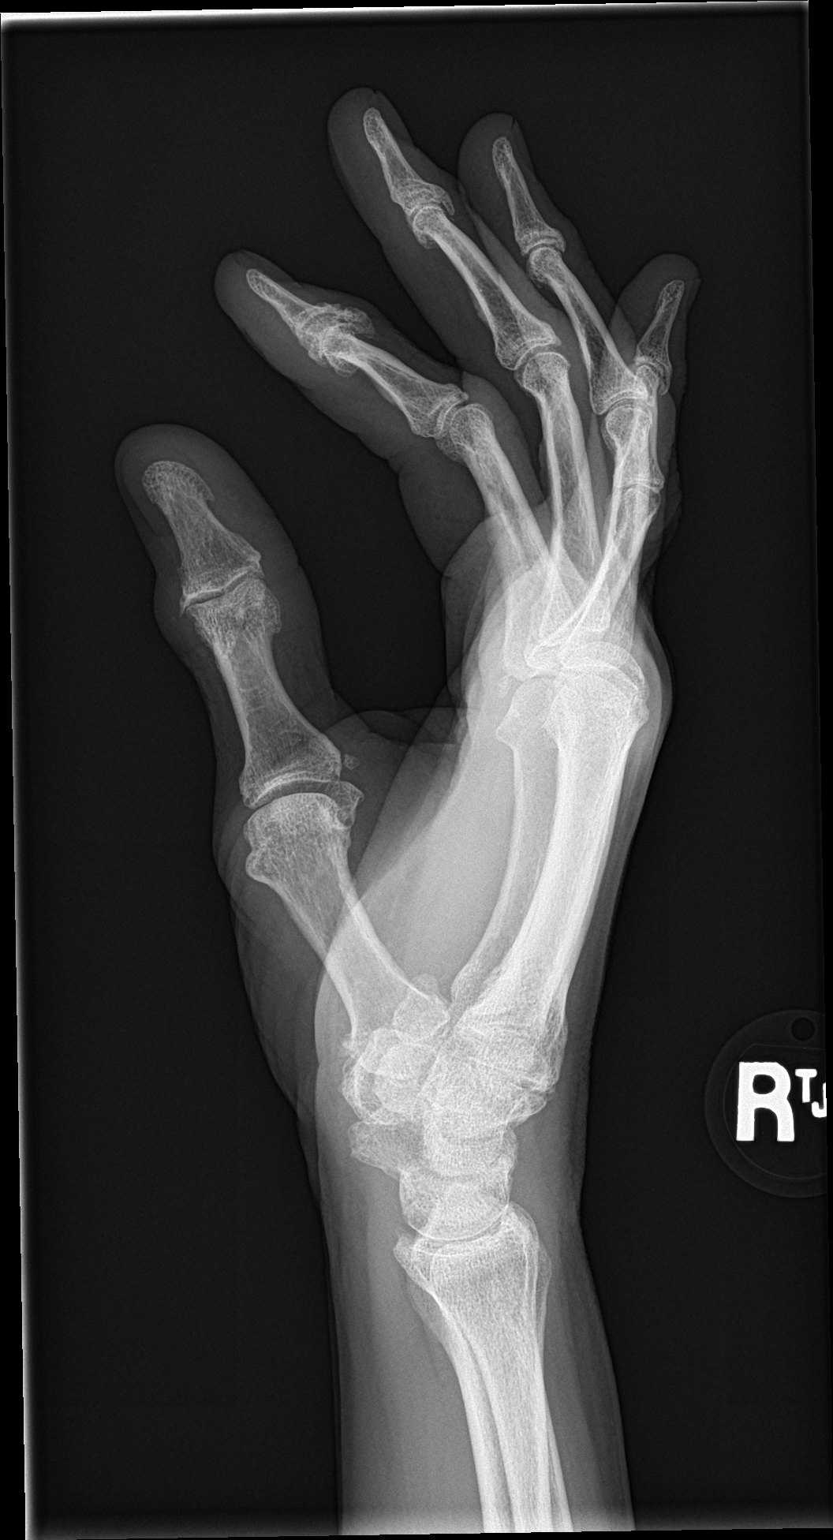

[3 of 3 positions shown; findings below may reference images not displayed]

FINDINGS: Diffuse degenerative change. Degenerative changes most prominent
about the first carpometacarpal joint and second distal
interphalangeal joint. No evidence of erosive arthropathy. No
evidence of fracture or dislocation. No acute bony abnormality
identified. No radiopaque foreign body.
IMPRESSION: Diffuse degenerative change. Degenerative changes most prominent
about the first carpometacarpal joint and second distal
interphalangeal joint. No evidence of erosive arthropathy. No acute
abnormality identified.

## 2020-10-10 DIAGNOSIS — H40013 Open angle with borderline findings, low risk, bilateral: Secondary | ICD-10-CM | POA: Diagnosis not present

## 2020-10-10 DIAGNOSIS — H35033 Hypertensive retinopathy, bilateral: Secondary | ICD-10-CM | POA: Diagnosis not present

## 2020-11-02 ENCOUNTER — Encounter: Payer: Medicare Other | Admitting: Family Medicine

## 2020-12-11 DIAGNOSIS — H40013 Open angle with borderline findings, low risk, bilateral: Secondary | ICD-10-CM | POA: Diagnosis not present

## 2020-12-20 ENCOUNTER — Telehealth: Payer: Self-pay

## 2020-12-20 NOTE — Progress Notes (Signed)
Left message for patient to call back and schedule Medicare Annual Wellness Visit (AWV).    Please offer to do virtually or by telephone sometime this week    Last AWV:11/02/2019    Please schedule at anytime with [facility] Health Advisor.    If any questions, please contact me at [place], [place], [place], [place] [address] Direct Dial: [phone] [email or web address] Website: [email or web address]

## 2020-12-24 ENCOUNTER — Encounter: Payer: Medicare Other | Admitting: Family Medicine

## 2020-12-31 ENCOUNTER — Telehealth: Payer: Self-pay | Admitting: Family Medicine

## 2020-12-31 ENCOUNTER — Other Ambulatory Visit: Payer: Self-pay

## 2020-12-31 DIAGNOSIS — F3340 Major depressive disorder, recurrent, in remission, unspecified: Secondary | ICD-10-CM

## 2020-12-31 DIAGNOSIS — G479 Sleep disorder, unspecified: Secondary | ICD-10-CM

## 2020-12-31 DIAGNOSIS — I10 Essential (primary) hypertension: Secondary | ICD-10-CM

## 2020-12-31 MED ORDER — MIRTAZAPINE 15 MG PO TABS: 15.0000 mg | ORAL_TABLET | Freq: Every day | ORAL | 0 refills | Status: DC

## 2020-12-31 MED ORDER — METOPROLOL SUCCINATE ER 100 MG PO TB24: ORAL_TABLET | ORAL | 0 refills | Status: DC

## 2020-12-31 MED ORDER — HYDROCHLOROTHIAZIDE 25 MG PO TABS: 25.0000 mg | ORAL_TABLET | Freq: Every day | ORAL | 0 refills | Status: DC

## 2020-12-31 NOTE — Telephone Encounter (Signed)
[facility] faxed refill request for the following medications:   hydrochlorothiazide (HYDRODIURIL) 25 MG tablet   metoprolol succinate (TOPROL-XL) 100 MG 24 hr tablet    mirtazapine (REMERON) 15 MG tablet  Please advise.

## 2021-01-09 ENCOUNTER — Telehealth: Payer: Self-pay | Admitting: Family Medicine

## 2021-01-09 DIAGNOSIS — E890 Postprocedural hypothyroidism: Secondary | ICD-10-CM

## 2021-01-09 MED ORDER — LEVOTHYROXINE SODIUM 112 MCG PO TABS: ORAL_TABLET | ORAL | 0 refills | Status: DC

## 2021-01-09 NOTE — Telephone Encounter (Signed)
[facility] faxed refill request for the following medications:  levothyroxine (SYNTHROID) 112 MCG tablet   Please advise.

## 2021-01-28 ENCOUNTER — Other Ambulatory Visit: Payer: Self-pay

## 2021-01-28 ENCOUNTER — Telehealth: Payer: Self-pay | Admitting: Physician Assistant

## 2021-01-28 MED ORDER — PRAVASTATIN SODIUM 20 MG PO TABS: 20.0000 mg | ORAL_TABLET | Freq: Every day | ORAL | 0 refills | Status: DC

## 2021-01-28 NOTE — Telephone Encounter (Signed)
[facility] faxed refill request for the following medications:   pravastatin (PRAVACHOL) 20 MG tablet   Please advise.

## 2021-04-03 ENCOUNTER — Other Ambulatory Visit: Payer: Self-pay | Admitting: Physician Assistant

## 2021-04-03 NOTE — Telephone Encounter (Signed)
Requested medication (s) are due for refill today:   Yes  Requested medication (s) are on the active medication list:   Yes  Future visit scheduled:   No.  I called her to make an appt but she needs to call back next week to schedule because her daughter is having surgery and she doesn't know the schedule for the surgery.  I let her know that would be fine.   Last ordered: 01/28/2021 #30, 0 refill  Returned for provider to review for further refills since courtesy refill given in Nov.     Requested Prescriptions  Pending Prescriptions Disp Refills   pravastatin (PRAVACHOL) 20 MG tablet [Pharmacy Med Name: Pravastatin Sodium 20 MG Oral Tablet] 30 tablet 0    Sig: Take 1 tablet by mouth once daily     Cardiovascular:  Antilipid - Statins Failed - 04/03/2021  2:01 PM      Failed - Total Cholesterol in normal range and within 360 days    Cholesterol, Total  Date Value Ref Range Status  01/16/2020 239 (H) 100 - 199 mg/dL Final          Failed - LDL in normal range and within 360 days    LDL Chol Calc (NIH)  Date Value Ref Range Status  01/16/2020 154 (H) 0 - 99 mg/dL Final          Failed - HDL in normal range and within 360 days    HDL  Date Value Ref Range Status  01/16/2020 67 >39 mg/dL Final          Failed - Triglycerides in normal range and within 360 days    Triglycerides  Date Value Ref Range Status  01/16/2020 103 0 - 149 mg/dL Final          Passed - Patient is not pregnant      Passed - Valid encounter within last 12 months    Recent Outpatient Visits           8 months ago Other specified counseling   [facility], PA-C   1 year ago Left hip pain   [facility], Vickki Muff, PA-C   1 year ago Arthritis of both hands   [facility], Vickki Muff, PA-C   1 year ago Recurrent major depressive disorder, in remission [facility])   [employer] [facility], Vickki Muff, PA-C   2 years  ago Recurrent major depressive disorder, in remission [facility])   [employer] [facility], Vickki Muff, [place]

## 2021-04-07 ENCOUNTER — Other Ambulatory Visit: Payer: Self-pay | Admitting: Family Medicine

## 2021-04-07 DIAGNOSIS — E890 Postprocedural hypothyroidism: Secondary | ICD-10-CM

## 2021-04-07 NOTE — Telephone Encounter (Signed)
Requested medication (s) are due for refill today: yes  Requested medication (s) are on the active medication list: yes  Last refill:  01/09/21 #90  Future visit scheduled: no  Notes to clinic:  overdue labs

## 2021-04-09 ENCOUNTER — Inpatient Hospital Stay: Payer: Medicare Other | Admitting: Anesthesiology

## 2021-04-09 ENCOUNTER — Encounter
Admission: EM | Disposition: A | Discharge status: Home / Self Care | Payer: Self-pay | Source: Home / Self Care | Attending: Internal Medicine

## 2021-04-09 ENCOUNTER — Other Ambulatory Visit: Payer: Self-pay

## 2021-04-09 ENCOUNTER — Inpatient Hospital Stay: Payer: Medicare Other

## 2021-04-09 ENCOUNTER — Emergency Department: Payer: Medicare Other

## 2021-04-09 ENCOUNTER — Inpatient Hospital Stay
Admission: EM | Admit: 2021-04-09 | Discharge: 2021-04-11 | DRG: 659 | Disposition: A | Discharge status: Home / Self Care | Payer: Medicare Other | Attending: Internal Medicine | Admitting: Internal Medicine

## 2021-04-09 ENCOUNTER — Encounter: Payer: Self-pay | Admitting: Emergency Medicine

## 2021-04-09 DIAGNOSIS — D509 Iron deficiency anemia, unspecified: Secondary | ICD-10-CM | POA: Diagnosis not present

## 2021-04-09 DIAGNOSIS — Z800 Family history of malignant neoplasm of digestive organs: Secondary | ICD-10-CM | POA: Diagnosis not present

## 2021-04-09 DIAGNOSIS — N3091 Cystitis, unspecified with hematuria: Secondary | ICD-10-CM

## 2021-04-09 DIAGNOSIS — Z833 Family history of diabetes mellitus: Secondary | ICD-10-CM | POA: Diagnosis not present

## 2021-04-09 DIAGNOSIS — N10 Acute tubulo-interstitial nephritis: Secondary | ICD-10-CM | POA: Diagnosis present

## 2021-04-09 DIAGNOSIS — F17210 Nicotine dependence, cigarettes, uncomplicated: Secondary | ICD-10-CM | POA: Diagnosis present

## 2021-04-09 DIAGNOSIS — N1330 Unspecified hydronephrosis: Secondary | ICD-10-CM | POA: Diagnosis not present

## 2021-04-09 DIAGNOSIS — Z880 Allergy status to penicillin: Secondary | ICD-10-CM

## 2021-04-09 DIAGNOSIS — E890 Postprocedural hypothyroidism: Secondary | ICD-10-CM | POA: Diagnosis not present

## 2021-04-09 DIAGNOSIS — Z90710 Acquired absence of both cervix and uterus: Secondary | ICD-10-CM

## 2021-04-09 DIAGNOSIS — Z86010 Personal history of colonic polyps: Secondary | ICD-10-CM

## 2021-04-09 DIAGNOSIS — N201 Calculus of ureter: Secondary | ICD-10-CM | POA: Diagnosis not present

## 2021-04-09 DIAGNOSIS — I10 Essential (primary) hypertension: Secondary | ICD-10-CM | POA: Diagnosis present

## 2021-04-09 DIAGNOSIS — N3000 Acute cystitis without hematuria: Secondary | ICD-10-CM | POA: Diagnosis not present

## 2021-04-09 DIAGNOSIS — Z803 Family history of malignant neoplasm of breast: Secondary | ICD-10-CM

## 2021-04-09 DIAGNOSIS — Z720 Tobacco use: Secondary | ICD-10-CM

## 2021-04-09 DIAGNOSIS — Z83438 Family history of other disorder of lipoprotein metabolism and other lipidemia: Secondary | ICD-10-CM | POA: Diagnosis not present

## 2021-04-09 DIAGNOSIS — N390 Urinary tract infection, site not specified: Secondary | ICD-10-CM | POA: Diagnosis not present

## 2021-04-09 DIAGNOSIS — N23 Unspecified renal colic: Secondary | ICD-10-CM

## 2021-04-09 DIAGNOSIS — Z79890 Hormone replacement therapy (postmenopausal): Secondary | ICD-10-CM

## 2021-04-09 DIAGNOSIS — Z9049 Acquired absence of other specified parts of digestive tract: Secondary | ICD-10-CM | POA: Diagnosis not present

## 2021-04-09 DIAGNOSIS — Z885 Allergy status to narcotic agent status: Secondary | ICD-10-CM

## 2021-04-09 DIAGNOSIS — Z825 Family history of asthma and other chronic lower respiratory diseases: Secondary | ICD-10-CM | POA: Diagnosis not present

## 2021-04-09 DIAGNOSIS — N132 Hydronephrosis with renal and ureteral calculous obstruction: Secondary | ICD-10-CM | POA: Diagnosis not present

## 2021-04-09 DIAGNOSIS — Z9889 Other specified postprocedural states: Secondary | ICD-10-CM | POA: Diagnosis not present

## 2021-04-09 DIAGNOSIS — E785 Hyperlipidemia, unspecified: Secondary | ICD-10-CM | POA: Diagnosis not present

## 2021-04-09 DIAGNOSIS — N136 Pyonephrosis: Secondary | ICD-10-CM | POA: Diagnosis not present

## 2021-04-09 DIAGNOSIS — Z888 Allergy status to other drugs, medicaments and biological substances status: Secondary | ICD-10-CM | POA: Diagnosis not present

## 2021-04-09 DIAGNOSIS — Z8616 Personal history of COVID-19: Secondary | ICD-10-CM

## 2021-04-09 DIAGNOSIS — F32A Depression, unspecified: Secondary | ICD-10-CM | POA: Diagnosis present

## 2021-04-09 DIAGNOSIS — Z79899 Other long term (current) drug therapy: Secondary | ICD-10-CM | POA: Diagnosis not present

## 2021-04-09 DIAGNOSIS — N135 Crossing vessel and stricture of ureter without hydronephrosis: Secondary | ICD-10-CM | POA: Diagnosis not present

## 2021-04-09 DIAGNOSIS — Z8261 Family history of arthritis: Secondary | ICD-10-CM | POA: Diagnosis not present

## 2021-04-09 DIAGNOSIS — U071 COVID-19: Secondary | ICD-10-CM | POA: Diagnosis not present

## 2021-04-09 DIAGNOSIS — Z85038 Personal history of other malignant neoplasm of large intestine: Secondary | ICD-10-CM | POA: Diagnosis not present

## 2021-04-09 DIAGNOSIS — N134 Hydroureter: Secondary | ICD-10-CM | POA: Diagnosis not present

## 2021-04-09 DIAGNOSIS — Z8249 Family history of ischemic heart disease and other diseases of the circulatory system: Secondary | ICD-10-CM

## 2021-04-09 DIAGNOSIS — N281 Cyst of kidney, acquired: Secondary | ICD-10-CM | POA: Diagnosis not present

## 2021-04-09 DIAGNOSIS — F3340 Major depressive disorder, recurrent, in remission, unspecified: Secondary | ICD-10-CM | POA: Diagnosis not present

## 2021-04-09 DIAGNOSIS — J90 Pleural effusion, not elsewhere classified: Secondary | ICD-10-CM | POA: Diagnosis not present

## 2021-04-09 DIAGNOSIS — M47816 Spondylosis without myelopathy or radiculopathy, lumbar region: Secondary | ICD-10-CM | POA: Diagnosis not present

## 2021-04-09 DIAGNOSIS — R109 Unspecified abdominal pain: Secondary | ICD-10-CM | POA: Diagnosis not present

## 2021-04-09 DIAGNOSIS — I517 Cardiomegaly: Secondary | ICD-10-CM | POA: Diagnosis not present

## 2021-04-09 HISTORY — PX: CYSTOSCOPY WITH URETEROSCOPY AND STENT PLACEMENT: SHX6377

## 2021-04-09 HISTORY — DX: Personal history of COVID-19: Z86.16

## 2021-04-09 LAB — RESP PANEL BY RT-PCR (FLU A&B, COVID) ARPGX2
Influenza A by PCR: NEGATIVE
Influenza B by PCR: NEGATIVE
SARS Coronavirus 2 by RT PCR: POSITIVE — AB

## 2021-04-09 LAB — BASIC METABOLIC PANEL
Anion gap: 8 (ref 5–15)
BUN: 19 mg/dL (ref 8–23)
CO2: 28 mmol/L (ref 22–32)
Calcium: 9.3 mg/dL (ref 8.9–10.3)
Chloride: 105 mmol/L (ref 98–111)
Creatinine, Ser: 0.94 mg/dL (ref 0.44–1.00)
GFR, Estimated: >60 mL/min (ref >60)
Glucose, Bld: 118 mg/dL — ABNORMAL HIGH (ref 70–99)
Potassium: 3.6 mmol/L (ref 3.5–5.1)
Sodium: 141 mmol/L (ref 135–145)

## 2021-04-09 LAB — CBC
HCT: 35 % — ABNORMAL LOW (ref 36.0–46.0)
Hemoglobin: 11 g/dL — ABNORMAL LOW (ref 12.0–15.0)
MCH: 30.2 pg (ref 26.0–34.0)
MCHC: 31.4 g/dL (ref 30.0–36.0)
MCV: 96.2 fL (ref 80.0–100.0)
Platelets: 221 10*3/uL (ref 150–400)
RBC: 3.64 MIL/uL — ABNORMAL LOW (ref 3.87–5.11)
RDW: 14.2 % (ref 11.5–15.5)
WBC: 7.6 10*3/uL (ref 4.0–10.5)
nRBC: 0 % (ref 0.0–0.2)

## 2021-04-09 LAB — URINALYSIS, ROUTINE W REFLEX MICROSCOPIC
Bilirubin Urine: NEGATIVE
Glucose, UA: NEGATIVE mg/dL
Ketones, ur: NEGATIVE mg/dL
Nitrite: POSITIVE — AB
Protein, ur: NEGATIVE mg/dL
RBC / HPF: >50 RBC/hpf — ABNORMAL HIGH (ref 0–5)
Specific Gravity, Urine: 1.013 (ref 1.005–1.030)
WBC, UA: >50 WBC/hpf — ABNORMAL HIGH (ref 0–5)
pH: 5 (ref 5.0–8.0)

## 2021-04-09 LAB — APTT: aPTT: 32 seconds (ref 24–36)

## 2021-04-09 LAB — PROTIME-INR
INR: 1 (ref 0.8–1.2)
Prothrombin Time: 13.4 seconds (ref 11.4–15.2)

## 2021-04-09 SURGERY — CYSTOURETEROSCOPY, WITH STENT INSERTION
Anesthesia: General | Laterality: Right

## 2021-04-09 MED ORDER — METOPROLOL SUCCINATE ER 100 MG PO TB24
100.0000 mg | ORAL_TABLET | Freq: Every day | ORAL | Status: DC
Start: 2021-04-09 — End: 2021-04-11
  Administered 2021-04-09 – 2021-04-11 (×3): 100 mg via ORAL
  Filled 2021-04-09: qty 2
  Filled 2021-04-09 (×2): qty 1

## 2021-04-09 MED ORDER — ONDANSETRON HCL 4 MG/2ML IJ SOLN
4.0000 mg | Freq: Once | INTRAMUSCULAR | Status: AC
  Administered 2021-04-09: 4 mg via INTRAVENOUS
  Filled 2021-04-09: qty 2

## 2021-04-09 MED ORDER — SODIUM CHLORIDE 0.9 % IV SOLN: INTRAVENOUS | Status: DC

## 2021-04-09 MED ORDER — SODIUM CHLORIDE 0.9 % IV SOLN: 1.0000 g | INTRAVENOUS | Status: DC

## 2021-04-09 MED ORDER — SUCCINYLCHOLINE CHLORIDE 200 MG/10ML IV SOSY
PREFILLED_SYRINGE | INTRAVENOUS | Status: DC | PRN
  Administered 2021-04-09: 80 mg via INTRAVENOUS

## 2021-04-09 MED ORDER — ONDANSETRON HCL 4 MG/2ML IJ SOLN
INTRAMUSCULAR | Status: DC | PRN
  Administered 2021-04-09: 4 mg via INTRAVENOUS

## 2021-04-09 MED ORDER — ROCURONIUM BROMIDE 100 MG/10ML IV SOLN
INTRAVENOUS | Status: DC | PRN
  Administered 2021-04-09: 30 mg via INTRAVENOUS

## 2021-04-09 MED ORDER — MIRTAZAPINE 15 MG PO TABS
15.0000 mg | ORAL_TABLET | Freq: Every day | ORAL | Status: DC
  Administered 2021-04-09 – 2021-04-10 (×2): 15 mg via ORAL
  Filled 2021-04-09 (×2): qty 1

## 2021-04-09 MED ORDER — ASPIRIN-ACETAMINOPHEN-CAFFEINE 250-250-65 MG PO TABS
2.0000 | ORAL_TABLET | Freq: Two times a day (BID) | ORAL | Status: DC | PRN
  Filled 2021-04-09: qty 2

## 2021-04-09 MED ORDER — ACETAMINOPHEN 325 MG PO TABS
650.0000 mg | ORAL_TABLET | Freq: Four times a day (QID) | ORAL | Status: DC | PRN
  Administered 2021-04-09 – 2021-04-11 (×3): 650 mg via ORAL
  Filled 2021-04-09 (×3): qty 2

## 2021-04-09 MED ORDER — LIDOCAINE HCL (PF) 2 % IJ SOLN
INTRAMUSCULAR | Status: AC
  Filled 2021-04-09: qty 5

## 2021-04-09 MED ORDER — LIDOCAINE HCL (CARDIAC) PF 100 MG/5ML IV SOSY
PREFILLED_SYRINGE | INTRAVENOUS | Status: DC | PRN
  Administered 2021-04-09: 100 mg via INTRAVENOUS

## 2021-04-09 MED ORDER — LEVOTHYROXINE SODIUM 112 MCG PO TABS
112.0000 ug | ORAL_TABLET | Freq: Every day | ORAL | Status: DC
  Administered 2021-04-10 – 2021-04-11 (×2): 112 ug via ORAL
  Filled 2021-04-09 (×3): qty 1

## 2021-04-09 MED ORDER — SODIUM CHLORIDE 0.9 % IR SOLN
Status: DC | PRN
  Administered 2021-04-09: 1000 mL

## 2021-04-09 MED ORDER — SODIUM CHLORIDE 0.9 % IV SOLN
1.0000 g | INTRAVENOUS | Status: DC
  Administered 2021-04-10 – 2021-04-11 (×2): 1 g via INTRAVENOUS
  Filled 2021-04-09: qty 10
  Filled 2021-04-09: qty 1
  Filled 2021-04-09: qty 10

## 2021-04-09 MED ORDER — DEXAMETHASONE SODIUM PHOSPHATE 10 MG/ML IJ SOLN
INTRAMUSCULAR | Status: AC
  Filled 2021-04-09: qty 1

## 2021-04-09 MED ORDER — SUGAMMADEX SODIUM 500 MG/5ML IV SOLN
INTRAVENOUS | Status: DC | PRN
  Administered 2021-04-09: 500 mg via INTRAVENOUS

## 2021-04-09 MED ORDER — FENTANYL CITRATE (PF) 100 MCG/2ML IJ SOLN
INTRAMUSCULAR | Status: AC
  Filled 2021-04-09: qty 2

## 2021-04-09 MED ORDER — PROPOFOL 10 MG/ML IV BOLUS
INTRAVENOUS | Status: DC | PRN
  Administered 2021-04-09: 100 mg via INTRAVENOUS
  Administered 2021-04-09: 30 mg via INTRAVENOUS

## 2021-04-09 MED ORDER — ONDANSETRON HCL 4 MG/2ML IJ SOLN: 4.0000 mg | Freq: Three times a day (TID) | INTRAMUSCULAR | Status: DC | PRN

## 2021-04-09 MED ORDER — PRAVASTATIN SODIUM 20 MG PO TABS
20.0000 mg | ORAL_TABLET | Freq: Every evening | ORAL | Status: DC
  Administered 2021-04-09 – 2021-04-10 (×2): 20 mg via ORAL
  Filled 2021-04-09 (×2): qty 1

## 2021-04-09 MED ORDER — DM-GUAIFENESIN ER 30-600 MG PO TB12: 1.0000 | ORAL_TABLET | Freq: Two times a day (BID) | ORAL | Status: DC | PRN

## 2021-04-09 MED ORDER — ROCURONIUM BROMIDE 10 MG/ML (PF) SYRINGE
PREFILLED_SYRINGE | INTRAVENOUS | Status: AC
  Filled 2021-04-09: qty 10

## 2021-04-09 MED ORDER — LACTATED RINGERS IV SOLN: INTRAVENOUS | Status: DC | PRN

## 2021-04-09 MED ORDER — ACETAMINOPHEN 10 MG/ML IV SOLN
INTRAVENOUS | Status: DC | PRN
  Administered 2021-04-09: 1000 mg via INTRAVENOUS

## 2021-04-09 MED ORDER — SODIUM CHLORIDE 0.9 % IV BOLUS
1000.0000 mL | Freq: Once | INTRAVENOUS | Status: AC
  Administered 2021-04-09: 1000 mL via INTRAVENOUS

## 2021-04-09 MED ORDER — ACETAMINOPHEN 10 MG/ML IV SOLN
INTRAVENOUS | Status: AC
  Filled 2021-04-09: qty 100

## 2021-04-09 MED ORDER — DEXTROSE 5 % IV SOLN
INTRAVENOUS | Status: DC | PRN
  Administered 2021-04-09: 1 g via INTRAVENOUS

## 2021-04-09 MED ORDER — HYDRALAZINE HCL 20 MG/ML IJ SOLN: 5.0000 mg | INTRAMUSCULAR | Status: DC | PRN

## 2021-04-09 MED ORDER — FENTANYL CITRATE PF 50 MCG/ML IJ SOSY
12.5000 ug | PREFILLED_SYRINGE | INTRAMUSCULAR | Status: DC | PRN
Start: 2021-04-09 — End: 2021-04-10

## 2021-04-09 MED ORDER — GLYCOPYRROLATE 0.2 MG/ML IJ SOLN
INTRAMUSCULAR | Status: DC | PRN
  Administered 2021-04-09: .2 mg via INTRAVENOUS

## 2021-04-09 MED ORDER — DEXAMETHASONE SODIUM PHOSPHATE 10 MG/ML IJ SOLN
INTRAMUSCULAR | Status: DC | PRN
  Administered 2021-04-09: 10 mg via INTRAVENOUS

## 2021-04-09 MED ORDER — LEVOFLOXACIN IN D5W 500 MG/100ML IV SOLN
500.0000 mg | Freq: Once | INTRAVENOUS | Status: AC
  Administered 2021-04-09: 500 mg via INTRAVENOUS
  Filled 2021-04-09: qty 100

## 2021-04-09 MED ORDER — ONDANSETRON HCL 4 MG/2ML IJ SOLN
INTRAMUSCULAR | Status: AC
  Filled 2021-04-09: qty 2

## 2021-04-09 MED ORDER — ALBUTEROL SULFATE HFA 108 (90 BASE) MCG/ACT IN AERS
2.0000 | INHALATION_SPRAY | RESPIRATORY_TRACT | Status: DC | PRN
  Filled 2021-04-09: qty 6.7

## 2021-04-09 MED ORDER — IOHEXOL 180 MG/ML  SOLN
INTRAMUSCULAR | Status: DC | PRN
  Administered 2021-04-09: 15 mL

## 2021-04-09 MED ORDER — NICOTINE 21 MG/24HR TD PT24
21.0000 mg | MEDICATED_PATCH | Freq: Every day | TRANSDERMAL | Status: DC
  Filled 2021-04-09 (×2): qty 1

## 2021-04-09 SURGICAL SUPPLY — 32 items
BAG DRAIN CYSTO-URO LG1000N (MISCELLANEOUS) ×2 IMPLANT
BASKET LASER NITINOL 1.9FR (BASKET) IMPLANT
BRUSH SCRUB EZ 1% IODOPHOR (MISCELLANEOUS) ×2 IMPLANT
BSKT STON RTRVL 120 1.9FR (BASKET)
CATH URET FLEX-TIP 2 LUMEN 10F (CATHETERS) IMPLANT
CATH URETL OPEN END 6FR 70 (CATHETERS) ×1 IMPLANT
CATH URETL OPEN END 6X70 (CATHETERS) IMPLANT
CNTNR SPEC 2.5X3XGRAD LEK (MISCELLANEOUS)
CONT SPEC 4OZ STER OR WHT (MISCELLANEOUS)
CONT SPEC 4OZ STRL OR WHT (MISCELLANEOUS)
CONTAINER SPEC 2.5X3XGRAD LEK (MISCELLANEOUS) IMPLANT
DRAPE UTILITY 15X26 TOWEL STRL (DRAPES) ×2 IMPLANT
GAUZE 4X4 16PLY ~~LOC~~+RFID DBL (SPONGE) ×4 IMPLANT
GLOVE SURG UNDER POLY LF SZ7.5 (GLOVE) ×2 IMPLANT
GOWN STRL REUS W/ TWL LRG LVL3 (GOWN DISPOSABLE) ×1 IMPLANT
GOWN STRL REUS W/ TWL XL LVL3 (GOWN DISPOSABLE) ×1 IMPLANT
GOWN STRL REUS W/TWL LRG LVL3 (GOWN DISPOSABLE) ×2
GOWN STRL REUS W/TWL XL LVL3 (GOWN DISPOSABLE) ×2
GUIDEWIRE STR DUAL SENSOR (WIRE) ×2 IMPLANT
INFUSOR MANOMETER BAG 3000ML (MISCELLANEOUS) ×2 IMPLANT
IV NS IRRIG 3000ML ARTHROMATIC (IV SOLUTION) ×2 IMPLANT
KIT TURNOVER CYSTO (KITS) ×2 IMPLANT
PACK CYSTO AR (MISCELLANEOUS) ×2 IMPLANT
SET CYSTO W/LG BORE CLAMP LF (SET/KITS/TRAYS/PACK) ×2 IMPLANT
SHEATH URETERAL 12FRX35CM (MISCELLANEOUS) IMPLANT
STENT URET 6FRX22 CONTOUR (STENTS) ×1 IMPLANT
STENT URET 6FRX24 CONTOUR (STENTS) IMPLANT
STENT URET 6FRX26 CONTOUR (STENTS) IMPLANT
SURGILUBE 2OZ TUBE FLIPTOP (MISCELLANEOUS) ×2 IMPLANT
TRACTIP FLEXIVA PULSE ID 200 (Laser) ×2 IMPLANT
VALVE UROSEAL ADJ ENDO (VALVE) IMPLANT
WATER STERILE IRR 500ML POUR (IV SOLUTION) ×2 IMPLANT

## 2021-04-09 NOTE — ED Provider Notes (Signed)
[facility] Provider Note    Event Date/Time   First MD Initiated Contact with Patient 04/09/21 [phone]     (approximate)  History   Chief Complaint: Flank Pain  HPI  Melissa Fitzgerald is a 76 y.o. female with a past medical history of anemia, hypertension, hyperlipidemia, prior kidney stones, presents to the emergency department for right flank pain.  According to the patient for the past several days she has been experiencing pain to the right flank.  States she has had a slight burning when she urinates and this morning had nausea and vomiting x1.  No diarrhea.  No known fever.  Patient states this feels similar to past kidney stones she has had.  Physical Exam   Triage Vital Signs: ED Triage Vitals [04/09/21 0901]  Enc Vitals Group     BP (!) 150/97     Pulse Rate 65     Resp 18     Temp 98.9 F (37.2 C)     Temp Source Oral     SpO2 96 %     Weight 180 lb (81.6 kg)     Height 5\' 6"  (1.676 m)     Head Circumference      Peak Flow      Pain Score 8     Pain Loc      Pain Edu?      Excl. in [place]?     Most recent vital signs: Vitals:   04/09/21 0901  BP: (!) 150/97  Pulse: 65  Resp: 18  Temp: 98.9 F (37.2 C)  SpO2: 96%    General: Awake, no distress.  CV:  Good peripheral perfusion.  Regular rate and rhythm  Resp:  Normal effort.  Equal breath sounds bilaterally.  Abd:  No distention.  Soft, nontender.  No rebound or guarding.  Mild right CVA tenderness to palpation.    ED Results / Procedures / Treatments   RADIOLOGY  I have personally reviewed the CT images.  Patient appears to have a right mid ureteral stone. Urology has read the CT as possibly 3 stacked stones in the right mid to distal ureter along with ureteral hydronephrosis.   MEDICATIONS ORDERED IN ED: Medications  levofloxacin (LEVAQUIN) IVPB 500 mg (has no administration in time range)  sodium chloride 0.9 % bolus 1,000 mL (has no administration in time range)   ondansetron (ZOFRAN) injection 4 mg (has no administration in time range)     IMPRESSION / MDM / ASSESSMENT AND PLAN / ED COURSE  I reviewed the triage vital signs and the nursing notes.  Patient presents emergency department for right flank pain dysuria no nausea or vomiting this morning.  Patient states this feels very similar to past kidney stones.  Given the patient's right flank pain differential would also include urinary tract infection, pyelonephritis, appendicitis.  Patient's urinalysis today shows greater than 50 red cells, white cells and many bacteria.  Reassuringly white blood cell count is normal renal function and remainder the chemistry is normal.  I have sent a urine culture on the patient's urine.  Given the patient's history of kidney stones and significant urinary tract infection we will obtain a CT renal scan to further evaluate to help rule out ureterolithiasis as well as evaluate for possible pyelonephritis.  We will treat the patient symptoms with fluids, Zofran.  We will start the patient on IV Levaquin due to his cephalosporin allergy.  Patient agreeable plan of care.  Patient CT  is consistent with multiple right-sided kidney stones in the ureter.  Given the patient's urinalysis showing many white cells red cells and bacteria nitrite positive along with nausea and vomiting today and right flank pain with right CVA tenderness I spoke to urology Dr. Bernardo Heater who states the patient would benefit from a ureteral stent.  Given the patient's nausea and vomiting I do believe the patient would require admission to the hospital service for ongoing IV antibiotics, urology intervention.  Patient agreeable to plan of care.  We will make the patient n.p.o.  FINAL CLINICAL IMPRESSION(S) / ED DIAGNOSES   Right flank pain Pyelonephritis Urinary tract infection Kidney stone  Note:  This document was prepared using Dragon voice recognition software and may include unintentional dictation  errors.   Harvest Dark, MD 04/09/21 1106

## 2021-04-09 NOTE — Anesthesia Procedure Notes (Signed)
Procedure Name: Intubation Date/Time: 04/09/2021 3:06 PM Performed by: Kelton Pillar, CRNA Pre-anesthesia Checklist: Patient identified, Emergency Drugs available, Suction available and Patient being monitored Patient Re-evaluated:Patient Re-evaluated prior to induction Oxygen Delivery Method: Circle system utilized Preoxygenation: Pre-oxygenation with 100% oxygen Induction Type: IV induction Ventilation: Mask ventilation without difficulty Laryngoscope Size: McGraph and 3 Grade View: Grade I Tube type: Oral Tube size: 6.5 mm Number of attempts: 1 Airway Equipment and Method: Stylet and Oral airway Placement Confirmation: ETT inserted through vocal cords under direct vision, positive ETCO2, breath sounds checked- equal and bilateral and CO2 detector Secured at: 21 cm Tube secured with: Tape Dental Injury: Teeth and Oropharynx as per pre-operative assessment

## 2021-04-09 NOTE — Transfer of Care (Addendum)
Immediate Anesthesia Transfer of Care Note  Patient: Melissa Fitzgerald  Procedure(s) Performed: CYSTOSCOPY WITH URETEROSCOPY AND STENT PLACEMENT (Right)  Patient Location: Assigned Room  Anesthesia Type:General  Level of Consciousness: awake, alert  and patient cooperative  Airway & Oxygen Therapy: Patient Spontanous Breathing  Post-op Assessment: Report given to RN and Post -op Vital signs reviewed and stable  Post vital signs: Reviewed and stable  Last Vitals:  Vitals Value Taken Time  BP    Temp    Pulse    Resp    SpO2      Last Pain:  Vitals:   04/09/21 1154  TempSrc: Oral  PainSc: 0-No pain         Complications: No notable events documented.

## 2021-04-09 NOTE — Progress Notes (Signed)
Pt to admitted to room 227 per MD order, report given to [facility]. Belongings sent with pt. Pt transported via stretcher.

## 2021-04-09 NOTE — ED Notes (Signed)
First nurse note  presents with right side abd pain and flank pain  states she has hx of renal stones

## 2021-04-09 NOTE — H&P (Signed)
History and Physical    Melissa Fitzgerald OBS:[id number] DOB: [birth year] DOA: 04/09/2021  Referring MD/NP/PA:   PCP: [name], PA-C   Patient coming from:  The patient is coming from home.  At baseline, pt is independent for most of ADL.        Chief Complaint: Right flank pain  HPI: Melissa Fitzgerald is a 76 y.o. female with medical history significant of hypertension, hyperlipidemia, GERD, hypothyroidism, depression anxiety, ureteral stone, kidney stone, IBS, iron deficiency anemia, colon cancer (cecum), tobacco use, who presents with right flank pain.  Patient still has right flank pain for more than 3 days.  It is constant, sharp, mild to moderate, nonradiating.  Associated with nausea and one-time of nonbilious nonbloody vomiting.  Patient does not have diarrhea. She has dysuria and burning on urination.  No hematuria.  Patient states that she had positive COVID home test 1 week ago. Initially she had some cough and shortness breath, which has resolved.  Currently patient does not have cough, shortness breath, chest pain, fever or chills.  ED Course: pt was found to have positive urinalysis (cloudy appearance, large amount of leukocyte, positive nitrite, many bacteria, WBC > 50), pending COVID PCR, GFR > 60, temperature normal, blood pressure 150/97, heart rate 65, RR 18, oxygen saturation 96% on room air.  Patient is admitted to [place] bed as inpatient.  Dr. Bernardo Heater of urology is consulted.   EKG: Not done in ED, will get one.   Review of Systems:   General: no fevers, chills, no body weight gain, has fatigue HEENT: no blurry vision, hearing changes or sore throat Respiratory: no dyspnea, coughing, wheezing CV: no chest pain, no palpitations GI: has nausea, vomiting, no abdominal pain, diarrhea, constipation GU: has dysuria, burning on urination, increased urinary frequency, no hematuria  Ext: no leg edema Neuro: no unilateral weakness, numbness, or tingling, no vision change  or hearing loss Skin: no rash, no skin tear. MSK: No muscle spasm, no deformity, no limitation of range of movement in spin. Has right flank pain Heme: No easy bruising.  Travel history: No recent long distant travel.   Allergy:  Allergies  Allergen Reactions   Codeine Nausea And Vomiting   Oxycodone Nausea And Vomiting   Penicillins Nausea And Vomiting    Has patient had a PCN reaction causing immediate rash, facial/tongue/throat swelling, SOB or lightheadedness with hypotension: No Has patient had a PCN reaction causing severe rash involving mucus membranes or skin necrosis: No Has patient had a PCN reaction that required hospitalization: No Has patient had a PCN reaction occurring within the last 10 years: No If all of the above answers are "NO", then may proceed with Cephalosporin use.    Tramadol Nausea And Vomiting   Trintellix [Vortioxetine] Itching    Past Medical History:  Diagnosis Date   Anemia    Colon polyp    Depression    Hyperlipidemia    Hypertension    Hypothyroidism    Irritable bowel disease    Kidney stone    Thyroid disease     Past Surgical History:  Procedure Laterality Date   ABDOMINAL HYSTERECTOMY     BREAST ENHANCEMENT SURGERY     BREAST SURGERY     CHOLECYSTECTOMY  2000   COLECTOMY  09/2013   COLONOSCOPY  09-13-13   Dr [name]   COLONOSCOPY WITH PROPOFOL N/A 10/18/2014   Procedure: COLONOSCOPY WITH PROPOFOL;  Surgeon: [name], MD;  Location: [facility] ENDOSCOPY;  Service:  Endoscopy;  Laterality: N/A;   CYSTOSCOPY/URETEROSCOPY/HOLMIUM LASER/STENT PLACEMENT Right 06/23/2017   Procedure: CYSTOSCOPY/URETEROSCOPY/HOLMIUM LASER/STENT PLACEMENT;  Surgeon: Abbie Sons, MD;  Location: [facility] ORS;  Service: Urology;  Laterality: Right;   ESOPHAGOGASTRODUODENOSCOPY (EGD) WITH PROPOFOL N/A 01/03/2015   Procedure: ESOPHAGOGASTRODUODENOSCOPY (EGD) WITH PROPOFOL;  Surgeon: [name], MD;  Location: [facility] ENDOSCOPY;  Service: Endoscopy;   Laterality: N/A;   EYE SURGERY     bilateral cataract    FOOT SURGERY     KIDNEY STONE SURGERY     NASAL SINUS SURGERY     PORT-A-CATH REMOVAL     PORTACATH PLACEMENT  10/24/13   THYROID SURGERY     TONSILLECTOMY      Social History:  reports that she has been smoking cigarettes. She has never used smokeless tobacco. She reports current alcohol use. She reports that she does not use drugs.  Family History:  Family History  Problem Relation Age of Onset   Heart disease Mother    Diabetes Mother    Arthritis Sister    Hyperlipidemia Sister    COPD Brother    Diabetes Sister    Colon cancer Other        first cousin   Breast cancer Maternal Aunt    Breast cancer Paternal Aunt      Prior to Admission medications   Medication Sig Start Date End Date Taking? Authorizing Provider  aspirin-acetaminophen-caffeine (EXCEDRIN MIGRAINE) [phone] MG tablet Take 2 tablets by mouth 2 (two) times daily as needed for headache or migraine. 06/26/17   Gouru, Illene Silver, MD  carboxymethylcellulose (REFRESH PLUS) 0.5 % SOLN Place 1 drop into both eyes daily as needed (dry eyes).     [provider]  Ferrous Sulfate 28 MG TABS Take 28 mg by mouth daily. Patient not taking: Reported on 11/02/2019    [provider]  hydrochlorothiazide (HYDRODIURIL) 25 MG tablet Take 1 tablet (25 mg total) by mouth daily. 12/31/20   Jerrol Banana., MD  levothyroxine (SYNTHROID) 112 MCG tablet TAKE 1 TABLET BY MOUTH ONCE DAILY BEFORE BREAKFAST 04/08/21   Gwyneth Sprout, FNP  Menthol, Topical Analgesic, (BIOFREEZE EX) Apply 1 application topically daily as needed (pain). Patient not taking: Reported on 01/16/2020    [provider]  metoprolol succinate (TOPROL-XL) 100 MG 24 hr tablet TAKE 1 TABLET BY MOUTH ONCE DAILY WITH  OR  IMMEDIATELY  FOLLOWING  A  MEAL 12/31/20   Jerrol Banana., MD  mirtazapine (REMERON) 15 MG tablet Take 1 tablet (15 mg total) by mouth at bedtime. 12/31/20    Jerrol Banana., MD  ondansetron (ZOFRAN) 4 MG tablet Take 1 tablet (4 mg total) by mouth every 8 (eight) hours as needed for nausea or vomiting. Patient not taking: Reported on 01/16/2020 02/04/18   Chrismon, Vickki Muff, PA-C  pravastatin (PRAVACHOL) 20 MG tablet Take 1 tablet (20 mg total) by mouth daily. 01/28/21   Drubel, Ria Comment, PA-C  predniSONE (DELTASONE) 5 MG tablet Taper down dosage by 1 tablet daily by mouth starting at 6 day 1, 5 day 2, 4 day 3, 3 day 4, 2 day 5 and 1 day 6. Divide tablets among meals and bedtime. Patient not taking: Reported on 11/02/2019 09/21/19   Tania Ade    Physical Exam: Vitals:   04/09/21 1540 04/09/21 1541 04/09/21 1542 04/09/21 1600  BP:   (!) 196/110 139/82  Pulse: 87 86 77 63  Resp: 13 (!) 22 (!) 21 20  Temp:    (!) 97.5 F (36.4 C)  TempSrc:    Oral  SpO2: 100% 100% 100% 95%  Weight:      Height:       General: Not in acute distress HEENT:       Eyes: PERRL, EOMI, no scleral icterus.       ENT: No discharge from the ears and nose, no pharynx injection, no tonsillar enlargement.        Neck: No JVD, no bruit, no mass felt. Heme: No neck lymph node enlargement. Cardiac: S1/S2, RRR, No murmurs, No gallops or rubs. Respiratory: No rales, wheezing, rhonchi or rubs. GI: Soft, nondistended, nontender, no rebound pain, no organomegaly, BS present. GU: positive right CVA tenderness Ext: No pitting leg edema bilaterally. 1+DP/PT pulse bilaterally. Musculoskeletal: No joint deformities, No joint redness or warmth, no limitation of ROM in spin. Skin: No rashes.  Neuro: Alert, oriented X3, cranial nerves II-XII grossly intact, moves all extremities normally.  Psych: Patient is not psychotic, no suicidal or hemocidal ideation.  Labs on Admission: I have personally reviewed following labs and imaging studies  CBC: Recent Labs  Lab 04/09/21 0902  WBC 7.6  HGB 11.0*  HCT 35.0*  MCV 96.2  PLT 562   Basic Metabolic  Panel: Recent Labs  Lab 04/09/21 0902  NA 141  K 3.6  CL 105  CO2 28  GLUCOSE 118*  BUN 19  CREATININE 0.94  CALCIUM 9.3   GFR: Estimated Creatinine Clearance: 55.7 mL/min (by C-G formula based on SCr of 0.94 mg/dL). Liver Function Tests: No results for input(s): AST, ALT, ALKPHOS, BILITOT, PROT, ALBUMIN in the last 168 hours. No results for input(s): LIPASE, AMYLASE in the last 168 hours. No results for input(s): AMMONIA in the last 168 hours. Coagulation Profile: Recent Labs  Lab 04/09/21 1304  INR 1.0   Cardiac Enzymes: No results for input(s): CKTOTAL, CKMB, CKMBINDEX, TROPONINI in the last 168 hours. BNP (last 3 results) No results for input(s): PROBNP in the last 8760 hours. HbA1C: No results for input(s): HGBA1C in the last 72 hours. CBG: No results for input(s): GLUCAP in the last 168 hours. Lipid Profile: No results for input(s): CHOL, HDL, LDLCALC, TRIG, CHOLHDL, LDLDIRECT in the last 72 hours. Thyroid Function Tests: No results for input(s): TSH, T4TOTAL, FREET4, T3FREE, THYROIDAB in the last 72 hours. Anemia Panel: No results for input(s): VITAMINB12, FOLATE, FERRITIN, TIBC, IRON, RETICCTPCT in the last 72 hours. Urine analysis:    Component Value Date/Time   COLORURINE YELLOW (A) 04/09/2021 0902   APPEARANCEUR CLOUDY (A) 04/09/2021 0902   APPEARANCEUR Cloudy (A) 08/12/2017 1127   LABSPEC 1.013 04/09/2021 0902   LABSPEC 1.012 02/07/2014 1350   PHURINE 5.0 04/09/2021 0902   GLUCOSEU NEGATIVE 04/09/2021 0902   GLUCOSEU Negative 02/07/2014 1350   HGBUR LARGE (A) 04/09/2021 0902   BILIRUBINUR NEGATIVE 04/09/2021 0902   BILIRUBINUR Negative 08/12/2017 1127   BILIRUBINUR Negative 02/07/2014 1350   KETONESUR NEGATIVE 04/09/2021 0902   PROTEINUR NEGATIVE 04/09/2021 0902   UROBILINOGEN 0.2 05/26/2017 1450   NITRITE POSITIVE (A) 04/09/2021 0902   LEUKOCYTESUR LARGE (A) 04/09/2021 0902   LEUKOCYTESUR Negative 02/07/2014 1350   Sepsis  Labs: @LABRCNTIP (procalcitonin:4,lacticidven:4) ) Recent Results (from the past 240 hour(s))  Resp Panel by RT-PCR (Flu A&B, Covid) Nasopharyngeal Swab     Status: Abnormal   Collection Time: 04/09/21 11:32 AM   Specimen: Nasopharyngeal Swab; Nasopharyngeal(NP) swabs in vial transport medium  Result Value Ref Range Status   SARS  Coronavirus 2 by RT PCR POSITIVE (A) NEGATIVE Final    Comment: (NOTE) SARS-CoV-2 target nucleic acids are DETECTED.  The SARS-CoV-2 RNA is generally detectable in upper respiratory specimens during the acute phase of infection. Positive results are indicative of the presence of the identified virus, but do not rule out bacterial infection or co-infection with other pathogens not detected by the test. Clinical correlation with patient history and other diagnostic information is necessary to determine patient infection status. The expected result is Negative.  Fact Sheet for Patients: EntrepreneurPulse.com.au  Fact Sheet for Healthcare Providers: IncredibleEmployment.be  This test is not yet approved or cleared by the [place] FDA and  has been authorized for detection and/or diagnosis of SARS-CoV-2 by FDA under an Emergency Use Authorization (EUA).  This EUA will remain in effect (meaning this test can be used) for the duration of  the COVID-19 declaration under Section 564(b)(1) of the A ct, 21 U.S.C. section 360bbb-3(b)(1), unless the authorization is terminated or revoked sooner.     Influenza A by PCR NEGATIVE NEGATIVE Final   Influenza B by PCR NEGATIVE NEGATIVE Final    Comment: (NOTE) The Xpert Xpress SARS-CoV-2/FLU/RSV plus assay is intended as an aid in the diagnosis of influenza from Nasopharyngeal swab specimens and should not be used as a sole basis for treatment. Nasal washings and aspirates are unacceptable for Xpert Xpress SARS-CoV-2/FLU/RSV testing.  Fact Sheet for  Patients: EntrepreneurPulse.com.au  Fact Sheet for Healthcare Providers: IncredibleEmployment.be  This test is not yet approved or cleared by the [place] FDA and has been authorized for detection and/or diagnosis of SARS-CoV-2 by FDA under an Emergency Use Authorization (EUA). This EUA will remain in effect (meaning this test can be used) for the duration of the COVID-19 declaration under Section 564(b)(1) of the Act, 21 U.S.C. section 360bbb-3(b)(1), unless the authorization is terminated or revoked.  Performed at [facility], [address], [place], [place] [address]      Radiological Exams on Admission: DG Chest [facility] 1 View  Result Date: 04/09/2021 CLINICAL DATA:  COVID positive. EXAM: PORTABLE CHEST 1 VIEW COMPARISON:  06/23/2017 FINDINGS: Heart is enlarged, stable in configuration. Lungs are free of focal consolidations and pleural effusions. No pulmonary edema. IMPRESSION: Stable cardiomegaly. Electronically Signed   By: Nolon Nations M.D.   On: 04/09/2021 14:49   DG OR UROLOGY CYSTO IMAGE ([facility] ONLY)  Result Date: 04/09/2021 There is no interpretation for this exam.  This order is for images obtained during a surgical procedure.  Please See "Surgeries" Tab for more information regarding the procedure.   CT Renal Stone Study  Result Date: 04/09/2021 CLINICAL DATA:  Flank pain, kidney stone suspected; right flank pain EXAM: CT ABDOMEN AND PELVIS WITHOUT CONTRAST TECHNIQUE: Multidetector CT imaging of the abdomen and pelvis was performed following the standard protocol without IV contrast. RADIATION DOSE REDUCTION: This exam was performed according to the departmental dose-optimization program which includes automated exposure control, adjustment of the mA and/or kV according to patient size and/or use of iterative reconstruction technique. COMPARISON:  March 2019 FINDINGS: Lower chest: No acute abnormality. Hepatobiliary: No  new focal liver abnormality is seen. Status post cholecystectomy. No biliary dilatation. Pancreas: Unremarkable apart from some atrophy. Spleen: Unremarkable. Adrenals/Urinary Tract: Adrenals are unremarkable. Left renal cysts. Bilateral renal calculi measuring up to 9 mm at the right lower pole there is right hydronephrosis and right hydroureter. There are 3 adjacent calcifications along the course of the distal right ureter (see series 6,  image 74) with the largest measuring 4 mm. Poorly distended bladder is unremarkable. Stomach/Bowel: Stomach is within normal limits. Bowel is normal in caliber. There are postoperative changes of the right colon. Vascular/Lymphatic: Atherosclerosis.  No enlarged nodes. Reproductive: Status post hysterectomy. No adnexal masses. Other: No free fluid.  No acute abnormality of the abdominal wall. Musculoskeletal: No acute osseous abnormality. Degenerative changes of the lumbar spine. IMPRESSION: Right hydroureteronephrosis. Three adjacent calcifications along the course of the distal right ureter (about 3 cm above the UVJ) with largest measuring 4 mm. Bilateral nonobstructing renal calculi. Electronically Signed   By: Macy Mis M.D.   On: 04/09/2021 10:05      Assessment/Plan Principal Problem:   Ureterovesical junction (UVJ) obstruction Active Problems:   Hydroureteronephrosis_right   Hypothyroidism, postop   Acute pyelonephritis   Anemia, iron deficiency   HLD (hyperlipidemia)   Essential (primary) hypertension   Clinical depression   Tobacco use   COVID-19 virus infection  Acute pyelonephritis, ureterovesical junction (UVJ) obstruction and hydroureteronephrosis_right: Patient does not meet criteria for sepsis.  Currently hemodynamically stable.  Dr. Bernardo Heater of urology is consulted.  -Admitted to MedS[facility] bed as inpatient -IV Rocephin (patient received 1 dose of Levaquin in ED) -Follow-up urine culture -IV fluid: 1 L LR, followed by 75 cc/h -As needed  fentanyl for right flank pain  Hypothyroidism, postop -Synthroid  Anemia, iron deficiency: Patient's not taking iron supplement currently.  Hemoglobin 11, stable -Follow-up with CBC  HLD (hyperlipidemia) -Pravastatin  Essential (primary) hypertension -IV hydralazine as needed -Hold HCTZ while patient is on n.p.o. -Metoprolol  Clinical depression -Remeron  Tobacco use -Nicotine patch  COVID-19 infection: Asymptomatic. -As needed albuterol, Mucinex -Follow-up chest x-ray --> negative        DVT ppx: SCD Code Status: Full code Family Communication: Yes, patient's daughter by phone Disposition Plan:  Anticipate discharge back to previous environment Consults called: Dr. Bernardo Heater of urology Admission status and Level of care: Med-Surg:   as inpt     Status is: Inpatient  Remains inpatient appropriate because: Patient has multiple comorbidities, including history of  kidney stone and ureteral stone, now presents with acute pyelonephritis, right hydroureteronephrosis due to obstructive right UVJ stone.  Patient will need ureteral stent placement.  Her presentation is highly complicated.  Patient at high risk of deteriorating, suggested developing septic shock.  Patient will need to be treated in hospital for at least 2 days.           Date of Service 04/09/2021    Ivor Costa [facility]   If 7PM-7AM, please contact night-coverage [email or web address] 04/09/2021, 5:19 PM

## 2021-04-09 NOTE — ED Notes (Signed)
ED TO INPATIENT HANDOFF REPORT  ED Nurse Name and Phone:  Baxter Flattery, RN  S Name/Age/Gender Melissa Fitzgerald 76 y.o. female Room/Bed: ED42A/ED42A  Code Status   Code Status: Prior  Home/SNF/Other Home Patient oriented to: self, place, time, and situation Is this baseline? Yes   Triage Complete: Triage complete  Chief Complaint Ureterovesical junction (UVJ) obstruction [N13.5]  Triage Note Pt here with right flank pain. Pt states she feels like she has a kidney stone, pt has had hx of same. Pt in NAD in triage.   Allergies Allergies  Allergen Reactions   Codeine Nausea And Vomiting   Oxycodone Nausea And Vomiting   Penicillins Nausea And Vomiting    Has patient had a PCN reaction causing immediate rash, facial/tongue/throat swelling, SOB or lightheadedness with hypotension: No Has patient had a PCN reaction causing severe rash involving mucus membranes or skin necrosis: No Has patient had a PCN reaction that required hospitalization: No Has patient had a PCN reaction occurring within the last 10 years: No If all of the above answers are "NO", then may proceed with Cephalosporin use.    Tramadol Nausea And Vomiting   Trintellix [Vortioxetine] Itching    Level of Care/Admitting Diagnosis ED Disposition     ED Disposition  Admit   Condition  --   Comment  Hospital Area: [facility] [[id number]]  Level of Care: Med-Surg [16]  Covid Evaluation: Asymptomatic Screening Protocol (No Symptoms)  Diagnosis: Ureterovesical junction (UVJ) obstruction [[id number]]  Admitting Physician: Ivor Costa [[id number]]  Attending Physician: Ivor Costa [[id number]]  Estimated length of stay: past midnight tomorrow  Certification:: I certify this patient will need inpatient services for at least 2 midnights          B Medical/Surgery History Past Medical History:  Diagnosis Date   Anemia    Colon polyp    Depression    Hyperlipidemia    Hypertension    Hypothyroidism     Irritable bowel disease    Kidney stone    Thyroid disease    Past Surgical History:  Procedure Laterality Date   ABDOMINAL HYSTERECTOMY     BREAST ENHANCEMENT SURGERY     BREAST SURGERY     CHOLECYSTECTOMY  2000   COLECTOMY  09/2013   COLONOSCOPY  09-13-13   Dr [name]   COLONOSCOPY WITH PROPOFOL N/A 10/18/2014   Procedure: COLONOSCOPY WITH PROPOFOL;  Surgeon: [name], MD;  Location: [facility];  Service: Endoscopy;  Laterality: N/A;   CYSTOSCOPY/URETEROSCOPY/HOLMIUM LASER/STENT PLACEMENT Right 06/23/2017   Procedure: CYSTOSCOPY/URETEROSCOPY/HOLMIUM LASER/STENT PLACEMENT;  Surgeon: Abbie Sons, MD;  Location: [facility];  Service: Urology;  Laterality: Right;   ESOPHAGOGASTRODUODENOSCOPY (EGD) WITH PROPOFOL N/A 01/03/2015   Procedure: ESOPHAGOGASTRODUODENOSCOPY (EGD) WITH PROPOFOL;  Surgeon: [name], MD;  Location: [facility];  Service: Endoscopy;  Laterality: N/A;   EYE SURGERY     bilateral cataract    FOOT SURGERY     KIDNEY STONE SURGERY     NASAL SINUS SURGERY     PORT-A-CATH REMOVAL     PORTACATH PLACEMENT  10/24/13   THYROID SURGERY     TONSILLECTOMY       A IV Location/Drains/Wounds Patient Lines/Drains/Airways Status     Active Line/Drains/Airways     Name Placement date Placement time Site Days   Implanted Port Left Chest --  --  Chest  --   Peripheral IV 04/09/21 20 G 1" Posterior;Right Forearm 04/09/21  0949  Forearm  less than 1  Ureteral Drain/Stent Right ureter 6 Fr. 06/23/17  0847  Right ureter  1386   Incision (Closed) 06/23/17 Perineum Other (Comment) 06/23/17  0755  -- 1386   Incision (Closed) 06/23/17 Perineum Other (Comment) 06/23/17  0810  -- 1386            Intake/Output Last 24 hours  Intake/Output Summary (Last 24 hours) at 04/09/2021 1135 Last data filed at 04/09/2021 1111 Gross per 24 hour  Intake 100 ml  Output --  Net 100 ml    Labs/Imaging Results for orders placed or performed during the  hospital encounter of 04/09/21 (from the past 48 hour(s))  Urinalysis, Routine w reflex microscopic Urine, Clean Catch     Status: Abnormal   Collection Time: 04/09/21  9:02 AM  Result Value Ref Range   Color, Urine YELLOW (A) YELLOW   APPearance CLOUDY (A) CLEAR   Specific Gravity, Urine 1.013 1.005 - 1.030   pH 5.0 5.0 - 8.0   Glucose, UA NEGATIVE NEGATIVE mg/dL   Hgb urine dipstick LARGE (A) NEGATIVE   Bilirubin Urine NEGATIVE NEGATIVE   Ketones, ur NEGATIVE NEGATIVE mg/dL   Protein, ur NEGATIVE NEGATIVE mg/dL   Nitrite POSITIVE (A) NEGATIVE   Leukocytes,Ua LARGE (A) NEGATIVE   RBC / HPF >50 (H) 0 - 5 RBC/hpf   WBC, UA >50 (H) 0 - 5 WBC/hpf   Bacteria, UA MANY (A) NONE SEEN   Squamous Epithelial / LPF 11-20 0 - 5    Comment: Performed at [facility], [address] [place], [place] [address]  Basic metabolic panel     Status: Abnormal   Collection Time: 04/09/21  9:02 AM  Result Value Ref Range   Sodium 141 135 - 145 mmol/L   Potassium 3.6 3.5 - 5.1 mmol/L   Chloride 105 98 - 111 mmol/L   CO2 28 22 - 32 mmol/L   Glucose, Bld 118 (H) 70 - 99 mg/dL    Comment: Glucose reference range applies only to samples taken after fasting for at least 8 hours.   BUN 19 8 - 23 mg/dL   Creatinine, Ser 0.94 0.44 - 1.00 mg/dL   Calcium 9.3 8.9 - 10.3 mg/dL   GFR, Estimated >60 >60 mL/min    Comment: (NOTE) Calculated using the CKD-EPI Creatinine Equation (2021)    Anion gap 8 5 - 15    Comment: Performed at [facility], [address] [place], [place] [address]  CBC     Status: Abnormal   Collection Time: 04/09/21  9:02 AM  Result Value Ref Range   WBC 7.6 4.0 - 10.5 K/uL   RBC 3.64 (L) 3.87 - 5.11 MIL/uL   Hemoglobin 11.0 (L) 12.0 - 15.0 g/dL   HCT 35.0 (L) 36.0 - 46.0 %   MCV 96.2 80.0 - 100.0 fL   MCH 30.2 26.0 - 34.0 pg   MCHC 31.4 30.0 - 36.0 g/dL   RDW 14.2 11.5 - 15.5 %   Platelets 221 150 - 400 K/uL   nRBC 0.0 0.0 - 0.2 %    Comment: Performed at  [facility], [address] [place], [place] [address]   CT Renal Stone Study  Result Date: 04/09/2021 CLINICAL DATA:  Flank pain, kidney stone suspected; right flank pain EXAM: CT ABDOMEN AND PELVIS WITHOUT CONTRAST TECHNIQUE: Multidetector CT imaging of the abdomen and pelvis was performed following the standard protocol without IV contrast. RADIATION DOSE REDUCTION: This exam was performed according to the departmental dose-optimization  program which includes automated exposure control, adjustment of the mA and/or kV according to patient size and/or use of iterative reconstruction technique. COMPARISON:  March 2019 FINDINGS: Lower chest: No acute abnormality. Hepatobiliary: No new focal liver abnormality is seen. Status post cholecystectomy. No biliary dilatation. Pancreas: Unremarkable apart from some atrophy. Spleen: Unremarkable. Adrenals/Urinary Tract: Adrenals are unremarkable. Left renal cysts. Bilateral renal calculi measuring up to 9 mm at the right lower pole there is right hydronephrosis and right hydroureter. There are 3 adjacent calcifications along the course of the distal right ureter (see series 6, image 74) with the largest measuring 4 mm. Poorly distended bladder is unremarkable. Stomach/Bowel: Stomach is within normal limits. Bowel is normal in caliber. There are postoperative changes of the right colon. Vascular/Lymphatic: Atherosclerosis.  No enlarged nodes. Reproductive: Status post hysterectomy. No adnexal masses. Other: No free fluid.  No acute abnormality of the abdominal wall. Musculoskeletal: No acute osseous abnormality. Degenerative changes of the lumbar spine. IMPRESSION: Right hydroureteronephrosis. Three adjacent calcifications along the course of the distal right ureter (about 3 cm above the UVJ) with largest measuring 4 mm. Bilateral nonobstructing renal calculi. Electronically Signed   By: Macy Mis M.D.   On: 04/09/2021 10:05    Pending Labs Unresulted  Labs (From admission, onward)     Start     Ordered   04/09/21 1128  Resp Panel by RT-PCR (Flu A&B, Covid) Nasopharyngeal Swab  (Tier 2 - Symptomatic/asymptomatic)  Once,   STAT        04/09/21 1127   04/09/21 1122  Protime-INR  Add-on,   AD        04/09/21 1121   04/09/21 1122  APTT  Add-on,   AD        04/09/21 1121   04/09/21 0930  Urine Culture  Add-on,   AD       Question:  Indication  Answer:  Dysuria   04/09/21 0930            Vitals/Pain Today's Vitals   04/09/21 0901  BP: (!) 150/97  Pulse: 65  Resp: 18  Temp: 98.9 F (37.2 C)  TempSrc: Oral  SpO2: 96%  Weight: 180 lb (81.6 kg)  Height: 5\' 6"  (1.676 m)  PainSc: 8     Isolation Precautions No active isolations  Medications Medications  fentaNYL (SUBLIMAZE) injection 12.5 mcg (has no administration in time range)  ondansetron (ZOFRAN) injection 4 mg (has no administration in time range)  acetaminophen (TYLENOL) tablet 650 mg (has no administration in time range)  0.9 %  sodium chloride infusion (has no administration in time range)  nicotine (NICODERM CQ - dosed in mg/24 hours) patch 21 mg (has no administration in time range)  hydrALAZINE (APRESOLINE) injection 5 mg (has no administration in time range)  cefTRIAXone (ROCEPHIN) 1 g in sodium chloride 0.9 % 100 mL IVPB (has no administration in time range)  levofloxacin (LEVAQUIN) IVPB 500 mg (0 mg Intravenous Stopped 04/09/21 1111)  sodium chloride 0.9 % bolus 1,000 mL (1,000 mLs Intravenous New Bag/Given 04/09/21 0950)  ondansetron (ZOFRAN) injection 4 mg (4 mg Intravenous Given 04/09/21 0949)    Mobility walks Low fall risk   Focused Assessments Pulmonary Assessment Handoff:  Lung sounds:   O2 Device: Room Air      R Recommendations: See Admitting Provider Note  Report given to:   Additional Notes:

## 2021-04-09 NOTE — Anesthesia Preprocedure Evaluation (Addendum)
Anesthesia Evaluation  Patient identified by MRN, date of birth, ID band Patient awake    Reviewed: Allergy & Precautions, NPO status , Patient's Chart, lab work & pertinent test results, reviewed documented beta blocker date and time   History of Anesthesia Complications (+) history of anesthetic complications (history of multiple attempts of DL for unexpected anterior airway. larygospasm noted on emergence last anesthetic. )  Airway Mallampati: II  TM Distance: >3 FB     Dental  (+) Chipped, Missing   Pulmonary pneumonia (Asymptomatic covid. Saturating 94 laying flat on RA), Current Smoker and Patient abstained from smoking., former smoker,    Pulmonary exam normal        Cardiovascular hypertension, Pt. on medications and Pt. on home beta blockers Normal cardiovascular exam     Neuro/Psych  Headaches, PSYCHIATRIC DISORDERS Depression    GI/Hepatic PUD, GERD  Controlled,  Endo/Other  Hypothyroidism   Renal/GU Renal disease (CKD)     Musculoskeletal  (+) Arthritis ,   Abdominal Normal abdominal exam  (+)   Peds  Hematology  (+) anemia ,   Anesthesia Other Findings Cystoscopy with Right Ureteral Stent Placement  Reproductive/Obstetrics                           Anesthesia Physical  Anesthesia Plan  ASA: 3  Anesthesia Plan: General   Post-op Pain Management:    Induction: Intravenous  PONV Risk Score and Plan: 2 and Ondansetron and Dexamethasone  Airway Management Planned: LMA  Additional Equipment:   Intra-op Plan:   Post-operative Plan: Extubation in OR  Informed Consent: I have reviewed the patients History and Physical, chart, labs and discussed the procedure including the risks, benefits and alternatives for the proposed anesthesia with the patient or authorized representative who has indicated his/her understanding and acceptance.     Dental advisory given  Plan  Discussed with: CRNA and Anesthesiologist  Anesthesia Plan Comments:        Anesthesia Quick Evaluation

## 2021-04-09 NOTE — Op Note (Signed)
Preoperative diagnosis:  Right distal ureteral calculi Urinary tract infection  Postoperative diagnosis:  Same  Procedure:  Cystoscopy Right ureteral stent placement (73F/22 cm)  Right retrograde pyelography with interpretation  Intraoperative fluoroscopy <30 minutes  Surgeon: Nicki Reaper C. Dereon Corkery, M.D.  Anesthesia: General  Complications: None  Intraoperative findings:  Cystoscopy-bladder mucosa with patchy erythema endoscopically consistent with cystitis.  No solid or papillary lesions.  UOs normal-appearing bilaterally.  No efflux seen from right ureteral orifice Right retrograde pyelogram moderate right hydronephrosis and hydroureter.    EBL: Minimal  Specimens: Urine right renal pelvis for culture  Indication: Melissa Fitzgerald is a 76 y.o. presenting to the ED with right flank pain, nausea, vomiting.  UA with significant pyuria/microhematuria.  No fever or leukocytosis.  Pain was difficult to control in the ED and presents for stent placement.  After reviewing the management options for treatment, she elected to proceed with the above surgical procedure(s). We have discussed the potential benefits and risks of the procedure, side effects of the proposed treatment, the likelihood of the patient achieving the goals of the procedure, and any potential problems that might occur during the procedure or recuperation. Informed consent has been obtained.  Description of procedure:  The patient was taken to the operating room and general anesthesia was induced.  The patient was placed in the dorsal lithotomy position, prepped and draped in the usual sterile fashion, and preoperative antibiotics were administered. A preoperative time-out was performed.   A 21 French cystoscope sheath with obturator was lubricated and passed per urethra.  The 30 degree lens was placed into the sheath and panendoscopy was performed with findings as described above.  A 0.38 sensor guidewire was then advanced up  the ureter into the renal pelvis under fluoroscopic guidance.  A 5 French open-ended ureteral catheter was then advanced over the wire to the renal pelvis and the guidewire was removed.  5 mL of grossly clear urine was aspirated and sent for culture.  Retrograde pyelogram was then performed with findings as described above.  The guidewire was replaced and the ureteral catheter was removed.  A 73F/22 cm Contour ureteral stent was then advanced over the guidewire under fluoroscopic guidance.  A good curl was noted in the renal pelvis on fluoroscopy and the distal stent end was well positioned under direct vision.  The bladder was then emptied and the procedure ended.  The patient appeared to tolerate the procedure well and without complications.  Due to her + COVID status she was not transported to the PACU and was recovered in the OR then transported back to her room.  Recommendation: If afebrile and pain controlled on oral analgesics okay for discharge tomorrow on oral antibiotics and will schedule follow-up ureteroscopic stone removal   John Giovanni, MD

## 2021-04-09 NOTE — Consult Note (Signed)
Urology Consult  Chief Complaint: Flank pain  History of Present Illness: Melissa Fitzgerald is a 76 y.o. female who presented to the ED this morning complaining of a 3-day history of right flank pain which has progressively worsened.  No identifiable precipitating, aggravating or alleviating factors.  Onset of nausea and vomiting this morning.  Prior history of stones and symptoms similar to previous stone episodes.  Previous ureteroscopic stone removal/stent placement.  No fever, chills.  She was afebrile in the ED and no leukocytosis however urine was nitrite + and microscopy with >50 RBC/>50 WBC.  CT showed right hydronephrosis/hydroureter with nonobstructing right renal calculi and at least 2 distal ureteral calculi measuring approximately 4 mm  She was going to be admitted to the hospital service for pain control and based on urinalysis stent placement was recommended.  Past Medical History:  Diagnosis Date   Anemia    Colon polyp    Depression    Hyperlipidemia    Hypertension    Hypothyroidism    Irritable bowel disease    Kidney stone    Thyroid disease     Past Surgical History:  Procedure Laterality Date   ABDOMINAL HYSTERECTOMY     BREAST ENHANCEMENT SURGERY     BREAST SURGERY     CHOLECYSTECTOMY  2000   COLECTOMY  09/2013   COLONOSCOPY  09-13-13   Dr [name]   COLONOSCOPY WITH PROPOFOL N/A 10/18/2014   Procedure: COLONOSCOPY WITH PROPOFOL;  Surgeon: [name], MD;  Location: [facility];  Service: Endoscopy;  Laterality: N/A;   CYSTOSCOPY/URETEROSCOPY/HOLMIUM LASER/STENT PLACEMENT Right 06/23/2017   Procedure: CYSTOSCOPY/URETEROSCOPY/HOLMIUM LASER/STENT PLACEMENT;  Surgeon: Abbie Sons, MD;  Location: [facility] ORS;  Service: Urology;  Laterality: Right;   ESOPHAGOGASTRODUODENOSCOPY (EGD) WITH PROPOFOL N/A 01/03/2015   Procedure: ESOPHAGOGASTRODUODENOSCOPY (EGD) WITH PROPOFOL;  Surgeon: [name], MD;  Location: [facility] ENDOSCOPY;  Service:  Endoscopy;  Laterality: N/A;   EYE SURGERY     bilateral cataract    FOOT SURGERY     KIDNEY STONE SURGERY     NASAL SINUS SURGERY     PORT-A-CATH REMOVAL     PORTACATH PLACEMENT  10/24/13   THYROID SURGERY     TONSILLECTOMY      Home Medications:  Current Meds  Medication Sig   aspirin-acetaminophen-caffeine (EXCEDRIN MIGRAINE) 250-250-65 MG tablet Take 2 tablets by mouth 2 (two) times daily as needed for headache or migraine.   hydrochlorothiazide (HYDRODIURIL) 25 MG tablet Take 1 tablet (25 mg total) by mouth daily.   levothyroxine (SYNTHROID) 112 MCG tablet TAKE 1 TABLET BY MOUTH ONCE DAILY BEFORE BREAKFAST   metoprolol succinate (TOPROL-XL) 100 MG 24 hr tablet TAKE 1 TABLET BY MOUTH ONCE DAILY WITH  OR  IMMEDIATELY  FOLLOWING  A  MEAL   mirtazapine (REMERON) 15 MG tablet Take 1 tablet (15 mg total) by mouth at bedtime.   pravastatin (PRAVACHOL) 20 MG tablet Take 1 tablet (20 mg total) by mouth daily.    Allergies:  Allergies  Allergen Reactions   Codeine Nausea And Vomiting   Oxycodone Nausea And Vomiting   Penicillins Nausea And Vomiting    Has patient had a PCN reaction causing immediate rash, facial/tongue/throat swelling, SOB or lightheadedness with hypotension: No Has patient had a PCN reaction causing severe rash involving mucus membranes or skin necrosis: No Has patient had a PCN reaction that required hospitalization: No Has patient had a PCN reaction occurring within the last 10 years: No If all of the  above answers are "NO", then may proceed with Cephalosporin use.    Tramadol Nausea And Vomiting   Trintellix [Vortioxetine] Itching    Family History  Problem Relation Age of Onset   Heart disease Mother    Diabetes Mother    Arthritis Sister    Hyperlipidemia Sister    COPD Brother    Diabetes Sister    Colon cancer Other        first cousin   Breast cancer Maternal Aunt    Breast cancer Paternal Aunt     Social History:  reports that she has been  smoking cigarettes. She has never used smokeless tobacco. She reports current alcohol use. She reports that she does not use drugs.  ROS: A complete review of systems was performed.  All systems are negative except for pertinent findings as noted.  Physical Exam:  Vital signs in last 24 hours: Temp:  [98.2 F (36.8 C)-98.9 F (37.2 C)] 98.5 F (36.9 C) (01/17 1248) Pulse Rate:  [62-65] 62 (01/17 1248) Resp:  [18] 18 (01/17 1248) BP: (128-150)/(72-97) 128/72 (01/17 1248) SpO2:  [96 %-98 %] 98 % (01/17 1248) Weight:  [81.6 kg] 81.6 kg (01/17 0901) Constitutional:  Alert and oriented, No acute distress HEENT: [place] AT, moist mucus membranes.  Trachea midline, no masses Cardiovascular: Regular rate and rhythm, no clubbing, cyanosis, or edema. Respiratory: Normal respiratory effort, lungs clear bilaterally GI: Abdomen is soft, nontender, nondistended, no abdominal masses GU: No CVA tenderness Skin: No rashes, bruises or suspicious lesions Lymph: No cervical or inguinal adenopathy Neurologic: Grossly intact, no focal deficits, moving all 4 extremities Psychiatric: Normal mood and affect   Laboratory Data:  Recent Labs    04/09/21 0902  WBC 7.6  HGB 11.0*  HCT 35.0*   Recent Labs    04/09/21 0902  NA 141  K 3.6  CL 105  CO2 28  GLUCOSE 118*  BUN 19  CREATININE 0.94  CALCIUM 9.3   Recent Labs    04/09/21 1304  INR 1.0   No results for input(s): LABURIN in the last 72 hours. Results for orders placed or performed during the hospital encounter of 04/09/21  Resp Panel by RT-PCR (Flu A&B, Covid) Nasopharyngeal Swab     Status: Abnormal   Collection Time: 04/09/21 11:32 AM   Specimen: Nasopharyngeal Swab; Nasopharyngeal(NP) swabs in vial transport medium  Result Value Ref Range Status   SARS Coronavirus 2 by RT PCR POSITIVE (A) NEGATIVE Final    Comment: (NOTE) SARS-CoV-2 target nucleic acids are DETECTED.  The SARS-CoV-2 RNA is generally detectable in upper  respiratory specimens during the acute phase of infection. Positive results are indicative of the presence of the identified virus, but do not rule out bacterial infection or co-infection with other pathogens not detected by the test. Clinical correlation with patient history and other diagnostic information is necessary to determine patient infection status. The expected result is Negative.  Fact Sheet for Patients: EntrepreneurPulse.com.au  Fact Sheet for Healthcare Providers: IncredibleEmployment.be  This test is not yet approved or cleared by the [place] FDA and  has been authorized for detection and/or diagnosis of SARS-CoV-2 by FDA under an Emergency Use Authorization (EUA).  This EUA will remain in effect (meaning this test can be used) for the duration of  the COVID-19 declaration under Section 564(b)(1) of the A ct, 21 U.S.C. section 360bbb-3(b)(1), unless the authorization is terminated or revoked sooner.     Influenza A by PCR NEGATIVE NEGATIVE Final  Influenza B by PCR NEGATIVE NEGATIVE Final    Comment: (NOTE) The Xpert Xpress SARS-CoV-2/FLU/RSV plus assay is intended as an aid in the diagnosis of influenza from Nasopharyngeal swab specimens and should not be used as a sole basis for treatment. Nasal washings and aspirates are unacceptable for Xpert Xpress SARS-CoV-2/FLU/RSV testing.  Fact Sheet for Patients: EntrepreneurPulse.com.au  Fact Sheet for Healthcare Providers: IncredibleEmployment.be  This test is not yet approved or cleared by the [place] FDA and has been authorized for detection and/or diagnosis of SARS-CoV-2 by FDA under an Emergency Use Authorization (EUA). This EUA will remain in effect (meaning this test can be used) for the duration of the COVID-19 declaration under Section 564(b)(1) of the Act, 21 U.S.C. section 360bbb-3(b)(1), unless the authorization is  terminated or revoked.  Performed at [facility] [address], [place], [place] [address]      Radiologic Imaging: CT scan was personally reviewed and interpreted  DG OR UROLOGY CYSTO IMAGE ([place])  Result Date: 04/09/2021 There is no interpretation for this exam.  This order is for images obtained during a surgical procedure.  Please See "Surgeries" Tab for more information regarding the procedure.   CT Renal Stone Study  Result Date: 04/09/2021 CLINICAL DATA:  Flank pain, kidney stone suspected; right flank pain EXAM: CT ABDOMEN AND PELVIS WITHOUT CONTRAST TECHNIQUE: Multidetector CT imaging of the abdomen and pelvis was performed following the standard protocol without IV contrast. RADIATION DOSE REDUCTION: This exam was performed according to the departmental dose-optimization program which includes automated exposure control, adjustment of the mA and/or kV according to patient size and/or use of iterative reconstruction technique. COMPARISON:  March 2019 FINDINGS: Lower chest: No acute abnormality. Hepatobiliary: No new focal liver abnormality is seen. Status post cholecystectomy. No biliary dilatation. Pancreas: Unremarkable apart from some atrophy. Spleen: Unremarkable. Adrenals/Urinary Tract: Adrenals are unremarkable. Left renal cysts. Bilateral renal calculi measuring up to 9 mm at the right lower pole there is right hydronephrosis and right hydroureter. There are 3 adjacent calcifications along the course of the distal right ureter (see series 6, image 74) with the largest measuring 4 mm. Poorly distended bladder is unremarkable. Stomach/Bowel: Stomach is within normal limits. Bowel is normal in caliber. There are postoperative changes of the right colon. Vascular/Lymphatic: Atherosclerosis.  No enlarged nodes. Reproductive: Status post hysterectomy. No adnexal masses. Other: No free fluid.  No acute abnormality of the abdominal wall. Musculoskeletal: No acute osseous  abnormality. Degenerative changes of the lumbar spine. IMPRESSION: Right hydroureteronephrosis. Three adjacent calcifications along the course of the distal right ureter (about 3 cm above the UVJ) with largest measuring 4 mm. Bilateral nonobstructing renal calculi. Electronically Signed   By: Macy Mis M.D.   On: 04/09/2021 10:05    Impression/Assessment:  76 y.o. female with right distal ureteral calculi and renal colic. Urine is nitrite positive with >50 WBC and >50 RBC.  No fever, chills or leukocytosis.  Urine did have significant squamous epithelial cells. The ED had plan on admitting her for pain control and based on above findings would recommend cystoscopy with placement of right ureteral stent Found to be COVID + but no symptoms  Plan:  The procedure was discussed in detail including potential risks of bleeding, infection/sepsis.  We discussed no attempt will be made to remove her calculi due to infection and risk of bacteremia/sepsis and she will require definitive stone treatment in the future All questions were answered and she desires to proceed   04/09/2021,  2:49 PM  John Giovanni,  MD

## 2021-04-09 NOTE — ED Notes (Signed)
Informed RN bed assigned 

## 2021-04-09 NOTE — ED Triage Notes (Signed)
Pt here with right flank pain. Pt states she feels like she has a kidney stone, pt has had hx of same. Pt in NAD in triage.

## 2021-04-10 ENCOUNTER — Encounter: Payer: Self-pay | Admitting: Urology

## 2021-04-10 DIAGNOSIS — F3340 Major depressive disorder, recurrent, in remission, unspecified: Secondary | ICD-10-CM

## 2021-04-10 DIAGNOSIS — N10 Acute tubulo-interstitial nephritis: Secondary | ICD-10-CM

## 2021-04-10 LAB — CBC
HCT: 32.5 % — ABNORMAL LOW (ref 36.0–46.0)
Hemoglobin: 10.2 g/dL — ABNORMAL LOW (ref 12.0–15.0)
MCH: 30 pg (ref 26.0–34.0)
MCHC: 31.4 g/dL (ref 30.0–36.0)
MCV: 95.6 fL (ref 80.0–100.0)
Platelets: 200 10*3/uL (ref 150–400)
RBC: 3.4 MIL/uL — ABNORMAL LOW (ref 3.87–5.11)
RDW: 14 % (ref 11.5–15.5)
WBC: 6.7 10*3/uL (ref 4.0–10.5)
nRBC: 0 % (ref 0.0–0.2)

## 2021-04-10 LAB — GLUCOSE, CAPILLARY
Glucose-Capillary: 110 mg/dL — ABNORMAL HIGH (ref 70–99)
Glucose-Capillary: 175 mg/dL — ABNORMAL HIGH (ref 70–99)

## 2021-04-10 MED ORDER — SUMATRIPTAN SUCCINATE 50 MG PO TABS: 50.0000 mg | ORAL_TABLET | Freq: Once | ORAL | Status: DC | PRN

## 2021-04-10 MED ORDER — SUMATRIPTAN SUCCINATE 50 MG PO TABS
50.0000 mg | ORAL_TABLET | ORAL | Status: AC | PRN
  Administered 2021-04-10 (×2): 50 mg via ORAL
  Filled 2021-04-10 (×2): qty 1

## 2021-04-10 MED ORDER — FENTANYL CITRATE PF 50 MCG/ML IJ SOSY
12.5000 ug | PREFILLED_SYRINGE | Freq: Two times a day (BID) | INTRAMUSCULAR | Status: DC | PRN
Start: 2021-04-10 — End: 2021-04-11

## 2021-04-10 NOTE — Progress Notes (Signed)
°  Progress Note   Patient: Melissa Fitzgerald YFV:[id number] DOB: March 24, 1946 DOA: 04/09/2021     1 DOS: the patient was seen and examined on 04/10/2021   Brief hospital course: 76 year old female with a known history of hypertension, hyperlipidemia, GERD, hypothyroidism, depression, anxiety, ureteral stone, kidney stone, IBS, iron deficiency anemia, colon cancer, tobacco abuse is admitted for acute pyelonephritis and right distal ureteral calculi  1/17: S/p cystoscopy and right ureteral stent placement by Dr. Bernardo Heater 1/18: Still in pain and not feeling comfortable to go home   Assessment and Plan * Ureterovesical junction (UVJ) obstruction- (present on admission) Status post cystoscopy and right ureteral stent placement by Dr. Bernardo Heater on 1/17.  Continue pain management for now and watch for fever curve tonight  COVID-19 virus infection- (present on admission) She is asymptomatic.  No further treatment needed.  Continue isolation  Tobacco use- (present on admission) Nicotine patch  Acute pyelonephritis- (present on admission) Continue IV antibiotic and fluids.  Likely due to kidney stone  Hydroureteronephrosis_right- (present on admission) Due to kidney stone.  Status post ureteral stent on the right by Dr. Bernardo Heater on 1/17  Hypothyroidism, postop- (present on admission) Continue Synthroid  Clinical depression- (present on admission) Continue Remeron   Subjective: Reports flank pain and not feeling comfortable going home yet  Objective Vital signs were reviewed and unremarkable.  General: Not in acute distress HEENT:       Eyes: PERRL, EOMI, no scleral icterus.       ENT: No discharge from the ears and nose, no pharynx injection, no tonsillar enlargement.        Neck: No JVD, no bruit, no mass felt. Heme: No neck lymph node enlargement. Cardiac: S1/S2, RRR, No murmurs, No gallops or rubs. Respiratory: No rales, wheezing, rhonchi or rubs. GI: Soft, nondistended, nontender, no  rebound pain, no organomegaly, BS present. Ext: No pitting leg edema bilaterally. 1+DP/PT pulse bilaterally. Musculoskeletal: No joint deformities, No joint redness or warmth, no limitation of ROM in spin. Skin: No rashes.  Neuro: Alert, oriented X3, cranial nerves II-XII grossly intact, moves all extremities normally.  Psych: Patient is not psychotic, no suicidal or hemocidal ideation.  Data Reviewed:  My review of labs, imaging, notes and other tests shows no new significant findings.   Family Communication: Updated patient's daughter over phone  Disposition: Status is: Inpatient  Remains inpatient appropriate because: Still having pain.  Possible discharge tomorrow on oral pain medication if her pain is controlled   DVT prophylaxis -SCDs  Time spent: 35 minutes  Author: Max Sane, MD 04/10/2021 4:31 PM  For on call review [email or web address].

## 2021-04-10 NOTE — Assessment & Plan Note (Signed)
Continue IV antibiotic and fluids.  Likely due to kidney stone

## 2021-04-10 NOTE — Assessment & Plan Note (Signed)
Continue Synthroid °

## 2021-04-10 NOTE — Assessment & Plan Note (Signed)
Due to kidney stone.  Status post ureteral stent on the right by Dr. Bernardo Heater on 1/17

## 2021-04-10 NOTE — Anesthesia Postprocedure Evaluation (Addendum)
Anesthesia Post Note  Patient: Melissa Fitzgerald  Procedure(s) Performed: CYSTOSCOPY WITH URETEROSCOPY AND STENT PLACEMENT (Right)  Patient location during evaluation: PACU (recovered in the OR due to covid precautions) Anesthesia Type: General Level of consciousness: awake and alert Pain management: pain level controlled Vital Signs Assessment: post-procedure vital signs reviewed and stable Respiratory status: spontaneous breathing, nonlabored ventilation and respiratory function stable Cardiovascular status: blood pressure returned to baseline and stable Postop Assessment: no apparent nausea or vomiting Anesthetic complications: no   No notable events documented.   Last Vitals:  Vitals:   04/10/21 0607 04/10/21 0823  BP: 136/78 (!) 141/73  Pulse: 66 (!) 51  Resp: 16 18  Temp: 36.6 C 36.5 C  SpO2: 99% 95%    Last Pain:  Vitals:   04/10/21 0823  TempSrc: Oral  PainSc:                  [place] Ouch

## 2021-04-10 NOTE — Hospital Course (Signed)
76 year old female with a known history of hypertension, hyperlipidemia, GERD, hypothyroidism, depression, anxiety, ureteral stone, kidney stone, IBS, iron deficiency anemia, colon cancer, tobacco abuse is admitted for acute pyelonephritis and right distal ureteral calculi  1/17: S/p cystoscopy and right ureteral stent placement by Dr. Bernardo Heater 1/18: Still in pain and not feeling comfortable to go home

## 2021-04-10 NOTE — Assessment & Plan Note (Signed)
-  Nicotine patch 

## 2021-04-10 NOTE — Assessment & Plan Note (Signed)
Status post cystoscopy and right ureteral stent placement by Dr. Bernardo Heater on 1/17.  Continue pain management for now and watch for fever curve tonight

## 2021-04-10 NOTE — Plan of Care (Signed)

## 2021-04-10 NOTE — Assessment & Plan Note (Signed)
Continue Remeron 

## 2021-04-10 NOTE — Assessment & Plan Note (Signed)
She is asymptomatic.  No further treatment needed.  Continue isolation

## 2021-04-11 ENCOUNTER — Telehealth: Payer: Self-pay

## 2021-04-11 LAB — CBC
HCT: 32.4 % — ABNORMAL LOW (ref 36.0–46.0)
Hemoglobin: 10.1 g/dL — ABNORMAL LOW (ref 12.0–15.0)
MCH: 29.7 pg (ref 26.0–34.0)
MCHC: 31.2 g/dL (ref 30.0–36.0)
MCV: 95.3 fL (ref 80.0–100.0)
Platelets: 209 10*3/uL (ref 150–400)
RBC: 3.4 MIL/uL — ABNORMAL LOW (ref 3.87–5.11)
RDW: 14.3 % (ref 11.5–15.5)
WBC: 6.7 10*3/uL (ref 4.0–10.5)
nRBC: 0 % (ref 0.0–0.2)

## 2021-04-11 LAB — BASIC METABOLIC PANEL
Anion gap: 5 (ref 5–15)
BUN: 21 mg/dL (ref 8–23)
CO2: 27 mmol/L (ref 22–32)
Calcium: 9 mg/dL (ref 8.9–10.3)
Chloride: 109 mmol/L (ref 98–111)
Creatinine, Ser: 0.87 mg/dL (ref 0.44–1.00)
GFR, Estimated: >60 mL/min (ref >60)
Glucose, Bld: 104 mg/dL — ABNORMAL HIGH (ref 70–99)
Potassium: 3.8 mmol/L (ref 3.5–5.1)
Sodium: 141 mmol/L (ref 135–145)

## 2021-04-11 LAB — GLUCOSE, CAPILLARY: Glucose-Capillary: 96 mg/dL (ref 70–99)

## 2021-04-11 MED ORDER — IBUPROFEN 600 MG PO TABS: 600.0000 mg | ORAL_TABLET | Freq: Three times a day (TID) | ORAL | 0 refills | Status: AC | PRN

## 2021-04-11 MED ORDER — CEPHALEXIN 500 MG PO CAPS: 500.0000 mg | ORAL_CAPSULE | Freq: Two times a day (BID) | ORAL | 0 refills | Status: AC

## 2021-04-11 MED ORDER — HYDROCODONE-ACETAMINOPHEN 5-325 MG PO TABS: 1.0000 | ORAL_TABLET | Freq: Two times a day (BID) | ORAL | 0 refills | Status: AC | PRN

## 2021-04-11 NOTE — Discharge Summary (Signed)
Physician Discharge Summary   Patient: Melissa Fitzgerald MRN: [id number] DOB: Apr 28, 1945  Admit date:     04/09/2021  Discharge date: 04/11/21  Discharge Physician: Max Sane   PCP: [name], PA-C   Recommendations at discharge:   Follow-up with outpatient providers as requested  Discharge Diagnoses Principal Problem:   Ureterovesical junction (UVJ) obstruction Active Problems:   Anemia, iron deficiency   HLD (hyperlipidemia)   Essential (primary) hypertension   Clinical depression   Hypothyroidism, postop   Hydroureteronephrosis_right   Acute pyelonephritis   Tobacco use   COVID-19 virus infection  Hospital Course   76 year old female with a known history of hypertension, hyperlipidemia, GERD, hypothyroidism, depression, anxiety, ureteral stone, kidney stone, IBS, iron deficiency anemia, colon cancer, tobacco abuse is admitted for acute pyelonephritis and right distal ureteral calculi  1/17: S/p cystoscopy and right ureteral stent placement by Dr. Bernardo Heater 1/18: Still in pain and not feeling comfortable to go home  * Ureterovesical junction (UVJ) obstruction- (present on admission) Status post cystoscopy and right ureteral stent placement by Dr. Bernardo Heater on 1/17.  Pain is much better control and has remained afebrile   COVID-19 virus infection- (present on admission) She is asymptomatic.  No further treatment needed.  Continue isolation  Tobacco use- (present on admission) Nicotine patch  Acute pyelonephritis- (present on admission) Continue antibiotics.  She remains afebrile  Hydroureteronephrosis_right- (present on admission) Due to kidney stone.  Status post ureteral stent on the right by Dr. Bernardo Heater on 1/17  Hypothyroidism, postop- (present on admission) Continue Synthroid  Clinical depression- (present on admission) Continue Remeron    Consultants: Urology Procedures performed: Cystoscopy and right ureteral stent placement on 1/17 Disposition:  Home Diet recommendation: Cardiac diet  DISCHARGE MEDICATION: Allergies as of 04/11/2021       Reactions   Codeine Nausea And Vomiting   Oxycodone Nausea And Vomiting   Penicillins Nausea And Vomiting   Has patient had a PCN reaction causing immediate rash, facial/tongue/throat swelling, SOB or lightheadedness with hypotension: No Has patient had a PCN reaction causing severe rash involving mucus membranes or skin necrosis: No Has patient had a PCN reaction that required hospitalization: No Has patient had a PCN reaction occurring within the last 10 years: No If all of the above answers are "NO", then may proceed with Cephalosporin use.   Tramadol Nausea And Vomiting   Trintellix [vortioxetine] Itching        Medication List     STOP taking these medications    BIOFREEZE EX   carboxymethylcellulose 0.5 % Soln Commonly known as: REFRESH PLUS   Ferrous Sulfate 28 MG Tabs   ondansetron 4 MG tablet Commonly known as: ZOFRAN   predniSONE 5 MG tablet Commonly known as: DELTASONE       TAKE these medications    aspirin-acetaminophen-caffeine 989-211-94 MG tablet Commonly known as: EXCEDRIN MIGRAINE Take 2 tablets by mouth 2 (two) times daily as needed for headache or migraine.   cephALEXin 500 MG capsule Commonly known as: KEFLEX Take 1 capsule (500 mg total) by mouth 2 (two) times daily for 7 days.   hydrochlorothiazide 25 MG tablet Commonly known as: HYDRODIURIL Take 1 tablet (25 mg total) by mouth daily.   HYDROcodone-acetaminophen 5-325 MG tablet Commonly known as: NORCO/VICODIN Take 1 tablet by mouth every 12 (twelve) hours as needed for up to 5 days for moderate pain.   ibuprofen 600 MG tablet Commonly known as: ADVIL Take 1 tablet (600 mg total) by mouth every 8 (eight) hours  as needed for up to 10 days for moderate pain or mild pain.   levothyroxine 112 MCG tablet Commonly known as: SYNTHROID TAKE 1 TABLET BY MOUTH ONCE DAILY BEFORE BREAKFAST    metoprolol succinate 100 MG 24 hr tablet Commonly known as: TOPROL-XL TAKE 1 TABLET BY MOUTH ONCE DAILY WITH  OR  IMMEDIATELY  FOLLOWING  A  MEAL   mirtazapine 15 MG tablet Commonly known as: REMERON Take 1 tablet (15 mg total) by mouth at bedtime.   pravastatin 20 MG tablet Commonly known as: PRAVACHOL Take 1 tablet (20 mg total) by mouth daily.        Follow-up Information     [name], PA-C. Go on 04/25/2021.   Specialty: Physician Assistant Why: [facility] Discharge F/UP they will make the patient with an appointment Contact information: [address] [place] [address] [phone]         Abbie Sons, MD. Go on 04/26/2021.   Specialty: Urology Why: [facility] Discharge F/UP 10amappointment Contact information: [place] [address] [phone]                 Discharge Exam: Danley Danker Weights   04/09/21 0901  Weight: 81.6 kg    General: Not in acute distress HEENT:       Eyes: PERRL, EOMI, no scleral icterus.       ENT: No discharge from the ears and nose, no pharynx injection, no tonsillar enlargement.        Neck: No JVD, no bruit, no mass felt. Heme: No neck lymph node enlargement. Cardiac: S1/S2, RRR, No murmurs, No gallops or rubs. Respiratory: No rales, wheezing, rhonchi or rubs. GI: Soft, nondistended, nontender, no rebound pain, no organomegaly, BS present. Ext: No pitting leg edema bilaterally. 1+DP/PT pulse bilaterally. Musculoskeletal: No joint deformities, No joint redness or warmth, no limitation of ROM in spin. Skin: No rashes.  Neuro: Alert, oriented X3, cranial nerves II-XII grossly intact, moves all extremities normally.  Psych: Patient is not psychotic, no suicidal or hemocidal ideation.  Condition at discharge: good  The results of significant diagnostics from this hospitalization (including imaging, microbiology, ancillary and laboratory) are listed below for  reference.   Imaging Studies: DG Chest Port 1 View  Result Date: 04/09/2021 CLINICAL DATA:  COVID positive. EXAM: PORTABLE CHEST 1 VIEW COMPARISON:  06/23/2017 FINDINGS: Heart is enlarged, stable in configuration. Lungs are free of focal consolidations and pleural effusions. No pulmonary edema. IMPRESSION: Stable cardiomegaly. Electronically Signed   By: Nolon Nations M.D.   On: 04/09/2021 14:49   DG OR UROLOGY CYSTO IMAGE ([facility] ONLY)  Result Date: 04/09/2021 There is no interpretation for this exam.  This order is for images obtained during a surgical procedure.  Please See "Surgeries" Tab for more information regarding the procedure.   CT Renal Stone Study  Result Date: 04/09/2021 CLINICAL DATA:  Flank pain, kidney stone suspected; right flank pain EXAM: CT ABDOMEN AND PELVIS WITHOUT CONTRAST TECHNIQUE: Multidetector CT imaging of the abdomen and pelvis was performed following the standard protocol without IV contrast. RADIATION DOSE REDUCTION: This exam was performed according to the departmental dose-optimization program which includes automated exposure control, adjustment of the mA and/or kV according to patient size and/or use of iterative reconstruction technique. COMPARISON:  March 2019 FINDINGS: Lower chest: No acute abnormality. Hepatobiliary: No new focal liver abnormality is seen. Status post cholecystectomy. No biliary dilatation. Pancreas: Unremarkable apart from some atrophy. Spleen: Unremarkable. Adrenals/Urinary Tract:  Adrenals are unremarkable. Left renal cysts. Bilateral renal calculi measuring up to 9 mm at the right lower pole there is right hydronephrosis and right hydroureter. There are 3 adjacent calcifications along the course of the distal right ureter (see series 6, image 74) with the largest measuring 4 mm. Poorly distended bladder is unremarkable. Stomach/Bowel: Stomach is within normal limits. Bowel is normal in caliber. There are postoperative changes of the right  colon. Vascular/Lymphatic: Atherosclerosis.  No enlarged nodes. Reproductive: Status post hysterectomy. No adnexal masses. Other: No free fluid.  No acute abnormality of the abdominal wall. Musculoskeletal: No acute osseous abnormality. Degenerative changes of the lumbar spine. IMPRESSION: Right hydroureteronephrosis. Three adjacent calcifications along the course of the distal right ureter (about 3 cm above the UVJ) with largest measuring 4 mm. Bilateral nonobstructing renal calculi. Electronically Signed   By: Macy Mis M.D.   On: 04/09/2021 10:05    Microbiology: Results for orders placed or performed during the hospital encounter of 04/09/21  Urine Culture     Status: Abnormal (Preliminary result)   Collection Time: 04/09/21  9:02 AM   Specimen: Urine, Random  Result Value Ref Range Status   Specimen Description   Final    URINE, RANDOM Performed at [facility], [address], [place], [place] [address]    Special Requests   Final    NONE Performed at [facility], [address] [place], [place] [address]    Culture (A)  Final    >=100,000 COLONIES/mL ESCHERICHIA COLI SUSCEPTIBILITIES TO FOLLOW Performed at [facility], [place] [address], [place],  [address]    Report Status PENDING  Incomplete  Resp Panel by RT-PCR (Flu A&B, Covid) Nasopharyngeal Swab     Status: Abnormal   Collection Time: 04/09/21 11:32 AM   Specimen: Nasopharyngeal Swab; Nasopharyngeal(NP) swabs in vial transport medium  Result Value Ref Range Status   SARS Coronavirus 2 by RT PCR POSITIVE (A) NEGATIVE Final    Comment: (NOTE) SARS-CoV-2 target nucleic acids are DETECTED.  The SARS-CoV-2 RNA is generally detectable in upper respiratory specimens during the acute phase of infection. Positive results are indicative of the presence of the identified virus, but do not rule out bacterial infection or co-infection with other pathogens not detected by the test. Clinical  correlation with patient history and other diagnostic information is necessary to determine patient infection status. The expected result is Negative.  Fact Sheet for Patients: EntrepreneurPulse.com.au  Fact Sheet for Healthcare Providers: IncredibleEmployment.be  This test is not yet approved or cleared by the [place] FDA and  has been authorized for detection and/or diagnosis of SARS-CoV-2 by FDA under an Emergency Use Authorization (EUA).  This EUA will remain in effect (meaning this test can be used) for the duration of  the COVID-19 declaration under Section 564(b)(1) of the A ct, 21 U.S.C. section 360bbb-3(b)(1), unless the authorization is terminated or revoked sooner.     Influenza A by PCR NEGATIVE NEGATIVE Final   Influenza B by PCR NEGATIVE NEGATIVE Final    Comment: (NOTE) The Xpert Xpress SARS-CoV-2/FLU/RSV plus assay is intended as an aid in the diagnosis of influenza from Nasopharyngeal swab specimens and should not be used as a sole basis for treatment. Nasal washings and aspirates are unacceptable for Xpert Xpress SARS-CoV-2/FLU/RSV testing.  Fact Sheet for Patients: EntrepreneurPulse.com.au  Fact Sheet for Healthcare Providers: IncredibleEmployment.be  This test is not yet approved or cleared by the [place] and has been authorized  for detection and/or diagnosis of SARS-CoV-2 by FDA under an Emergency Use Authorization (EUA). This EUA will remain in effect (meaning this test can be used) for the duration of the COVID-19 declaration under Section 564(b)(1) of the Act, 21 U.S.C. section 360bbb-3(b)(1), unless the authorization is terminated or revoked.  Performed at [facility], [address] [place], [place] [address]   Anaerobic culture w Gram Stain     Status: None (Preliminary result)   Collection Time: 04/09/21  3:27 PM   Specimen: PATH Other; Tissue   Result Value Ref Range Status   Specimen Description   Final    URINE, RANDOM Performed at [facility], [address], [place], [place] [address]    Special Requests   Final    URINE RIGHT RENAL Performed at [facility], [address] [place], [place] [address]    Gram Stain   Final    NO SQUAMOUS EPITHELIAL CELLS SEEN FEW WBC SEEN NO ORGANISMS SEEN Performed at [facility], [place] [address] [place], [place] [address]    Culture   Final    NO ANAEROBES ISOLATED; CULTURE IN PROGRESS FOR 5 DAYS   Report Status PENDING  Incomplete    Labs: CBC: Recent Labs  Lab 04/09/21 0902 04/10/21 1203 04/11/21 0536  WBC 7.6 6.7 6.7  HGB 11.0* 10.2* 10.1*  HCT 35.0* 32.5* 32.4*  MCV 96.2 95.6 95.3  PLT 221 200 151   Basic Metabolic Panel: Recent Labs  Lab 04/09/21 0902 04/11/21 0536  NA 141 141  K 3.6 3.8  CL 105 109  CO2 28 27  GLUCOSE 118* 104*  BUN 19 21  CREATININE 0.94 0.87  CALCIUM 9.3 9.0   Liver Function Tests: No results for input(s): AST, ALT, ALKPHOS, BILITOT, PROT, ALBUMIN in the last 168 hours. CBG: Recent Labs  Lab 04/10/21 0820 04/10/21 1146 04/11/21 0841  GLUCAP 110* 175* 96    Discharge time spent: greater than 30 minutes.  Signed: Max Sane, MD [facility] 04/11/2021

## 2021-04-11 NOTE — Plan of Care (Signed)
Patient planning for discharge.

## 2021-04-11 NOTE — Telephone Encounter (Signed)
Copied from [place] [id number]. Topic: Appointment Scheduling - Scheduling Inquiry for Clinic >> Apr 11, 2021 10:24 AM Scherrie Gerlach wrote: Reason for CRM: betty with [facility] called to schedule pt hospital follow up.  Pt is going home today.  Admitted on 1/17.  She did test positive for Covid, even though that is not why she was admitted. Unable to schedule hospital follow up w/ lindsay.  [facility] aware someone will call the pt at home to schedule appt.

## 2021-04-12 ENCOUNTER — Other Ambulatory Visit: Payer: Self-pay | Admitting: Urology

## 2021-04-12 DIAGNOSIS — N201 Calculus of ureter: Secondary | ICD-10-CM

## 2021-04-12 DIAGNOSIS — N200 Calculus of kidney: Secondary | ICD-10-CM

## 2021-04-12 LAB — URINE CULTURE: Culture: >=100000 — AB

## 2021-04-12 NOTE — Progress Notes (Signed)
Surgical Physician Order Form [facility] [place]  * Scheduling expectation :  2-3 weeks  *Length of Case: 60 minutes  *Clearance needed: no  *Anticoagulation Instructions: N/A  *Aspirin Instructions: N/A  *Post-op visit Date/Instructions:  1 week cysto stent removal  *Diagnosis: Right Ureteral Stone: Right nephrolithiasis  *Procedure: right  Ureteroscopy w/laser lithotripsy & stent exchange (73225)   Additional orders:  Needs a preop office visit with me  -Admit type: OUTpatient  -Anesthesia: General  -VTE Prophylaxis Standing Order SCDs       Other:   -Standing Lab Orders Per Anesthesia    Lab other: UA&Urine Culture  -Standing Test orders EKG/Chest x-ray per Anesthesia       Test other:   - Medications:  Gentamicin per pharmacy  -Other orders:  N/A

## 2021-04-14 LAB — ANAEROBIC CULTURE W GRAM STAIN: Gram Stain: NONE SEEN

## 2021-04-15 ENCOUNTER — Telehealth: Payer: Self-pay

## 2021-04-15 NOTE — Telephone Encounter (Signed)
Transition Care Management Follow-up Telephone Call Date of discharge and from where: TCM DC [facility] 04-11-21 Dx: ureterovesical junction obstruction How have you been since you were released from the hospital? Doing better Any questions or concerns? No  Items Reviewed: Did the pt receive and understand the discharge instructions provided? Yes  Medications obtained and verified? Yes  Other? No  Any new allergies since your discharge? No  Dietary orders reviewed? Yes Do you have support at home? Yes   Home Care and Equipment/Supplies: Were home health services ordered? no If so, what is the name of the agency? na  Has the agency set up a time to come to the patient's home? not applicable Were any new equipment or medical supplies ordered?  No What is the name of the medical supply agency? na Were you able to get the supplies/equipment? not applicable Do you have any questions related to the use of the equipment or supplies? No  Functional Questionnaire: (I = Independent and D = Dependent) ADLs: I  Bathing/Dressing- I  Meal Prep- I  Eating- I  Maintaining continence- I  Transferring/Ambulation- I  Managing Meds- I  Follow up appointments reviewed:  PCP Hospital f/u appt confirmed? No  pt states she was diagnosed with COVID 04-11-21 and will call to schedule appt next week. [facility] f/u appt confirmed? Yes  Scheduled to see Dr Johnnye Sima 04-29-21 @ 345pm. Are transportation arrangements needed? No  If their condition worsens, is the pt aware to call PCP or go to the Emergency Dept.? Yes Was the patient provided with contact information for the PCP's office or ED? Yes Was to pt encouraged to call back with questions or concerns? Yes

## 2021-04-18 ENCOUNTER — Inpatient Hospital Stay: Payer: Medicare Other | Admitting: Physician Assistant

## 2021-04-19 ENCOUNTER — Telehealth: Payer: Self-pay

## 2021-04-19 NOTE — Progress Notes (Signed)
[facility] Posting Form   Surgery Date/Time: Date: 05/07/2021  Surgeon: Dr. John Giovanni, MD  Surgery Location: Day Surgery  Inpt ( No  )   Outpt (Yes)   Obs ( No  )   Diagnosis: N20.1 Right Ureteral Stone, N20.0 Right Nephrolithiasis  -CPT: 92230  Surgery: Right Ureteroscopy with Laser Lithotripsy and Stent Exchange  Stop Anticoagulations: N/A  Cardiac/Medical/Pulmonary Clearance needed: no  *Orders entered into EPIC  Date: 04/19/21   *Case booked in EPIC  Date: 04/19/21  *Notified pt of Surgery: Date: 04/19/21  PRE-OP UA & CX: Yes, Will obtain in clinic 04/29/2021  *Placed into Prior Authorization Work Fabio Bering Date: 04/19/21   Assistant/laser/rep:No

## 2021-04-19 NOTE — Telephone Encounter (Signed)
I spoke with Melissa Fitzgerald. We have discussed possible surgery dates and Tuesday February 14th, 2023 was agreed upon by all parties. Patient given information about surgery date, what to expect pre-operatively and post operatively.   We discussed that a Pre-Admission Testing office will be calling to set up the pre-op visit that will take place prior to surgery, and that these appointments are typically done over the phone with a Pre-Admissions RN.   Informed patient that our office will communicate any additional care to be provided after surgery. Patients questions or concerns were discussed during our call. Advised to call our office should there be any additional information, questions or concerns that arise. Patient verbalized understanding.

## 2021-04-25 ENCOUNTER — Inpatient Hospital Stay: Payer: Medicare Other | Admitting: Physician Assistant

## 2021-04-26 ENCOUNTER — Ambulatory Visit: Payer: Medicare Other | Admitting: Urology

## 2021-04-26 LAB — HEMOGLOBIN A1C: Hemoglobin A1C: 5.6

## 2021-04-29 ENCOUNTER — Other Ambulatory Visit: Payer: Self-pay

## 2021-04-29 ENCOUNTER — Encounter: Payer: Self-pay | Admitting: Urology

## 2021-04-29 ENCOUNTER — Ambulatory Visit (INDEPENDENT_AMBULATORY_CARE_PROVIDER_SITE_OTHER): Payer: Medicare Other | Admitting: Urology

## 2021-04-29 VITALS — BP 130/82 | HR 85 | Ht 66.0 in | Wt 173.0 lb

## 2021-04-29 DIAGNOSIS — N200 Calculus of kidney: Secondary | ICD-10-CM | POA: Diagnosis not present

## 2021-04-29 DIAGNOSIS — N201 Calculus of ureter: Secondary | ICD-10-CM | POA: Diagnosis not present

## 2021-04-29 NOTE — Progress Notes (Signed)
04/29/2021 10:05 PM   Melissa Fitzgerald [birth year] [id number]  Referring provider: [name], PA-C [address] [place],  [place] [address]  Chief Complaint  Patient presents with   Nephrolithiasis    HPI: 77 y.o. female presents for preop visit.  Right ureteral stent placed 04/09/2021 for distal ureteral calculi x2 with pyuria Also with a nonobstructing right renal calculus No significant stent symptoms Denies fever, chills Scheduled for definitive stone treatment 05/07/2021 with ureteroscopy/laser lithotripsy/stent exchange   PMH: Past Medical History:  Diagnosis Date   Anemia    Colon polyp    Depression    Hyperlipidemia    Hypertension    Hypothyroidism    Irritable bowel disease    Kidney stone    Thyroid disease     Surgical History: Past Surgical History:  Procedure Laterality Date   ABDOMINAL HYSTERECTOMY     BREAST ENHANCEMENT SURGERY     BREAST SURGERY     CHOLECYSTECTOMY  2000   COLECTOMY  09/2013   COLONOSCOPY  09-13-13   Dr [name]   COLONOSCOPY WITH PROPOFOL N/A 10/18/2014   Procedure: COLONOSCOPY WITH PROPOFOL;  Surgeon: [name], MD;  Location: [facility] ENDOSCOPY;  Service: Endoscopy;  Laterality: N/A;   CYSTOSCOPY WITH URETEROSCOPY AND STENT PLACEMENT Right 04/09/2021   Procedure: CYSTOSCOPY WITH URETEROSCOPY AND STENT PLACEMENT;  Surgeon: Abbie Sons, MD;  Location: [facility] ORS;  Service: Urology;  Laterality: Right;   CYSTOSCOPY/URETEROSCOPY/HOLMIUM LASER/STENT PLACEMENT Right 06/23/2017   Procedure: CYSTOSCOPY/URETEROSCOPY/HOLMIUM LASER/STENT PLACEMENT;  Surgeon: Abbie Sons, MD;  Location: [facility] ORS;  Service: Urology;  Laterality: Right;   ESOPHAGOGASTRODUODENOSCOPY (EGD) WITH PROPOFOL N/A 01/03/2015   Procedure: ESOPHAGOGASTRODUODENOSCOPY (EGD) WITH PROPOFOL;  Surgeon: [name], MD;  Location: [facility] ENDOSCOPY;  Service: Endoscopy;  Laterality: N/A;   EYE SURGERY     bilateral cataract    FOOT SURGERY      KIDNEY STONE SURGERY     NASAL SINUS SURGERY     PORT-A-CATH REMOVAL     PORTACATH PLACEMENT  10/24/13   THYROID SURGERY     TONSILLECTOMY      Home Medications:  Allergies as of 04/29/2021       Reactions   Codeine Nausea And Vomiting   Oxycodone Nausea And Vomiting   Penicillins Nausea And Vomiting   Has patient had a PCN reaction causing immediate rash, facial/tongue/throat swelling, SOB or lightheadedness with hypotension: No Has patient had a PCN reaction causing severe rash involving mucus membranes or skin necrosis: No Has patient had a PCN reaction that required hospitalization: No Has patient had a PCN reaction occurring within the last 10 years: No If all of the above answers are "NO", then may proceed with Cephalosporin use.   Tramadol Nausea And Vomiting   Trintellix [vortioxetine] Itching        Medication List        Accurate as of April 29, 2021 10:05 PM. If you have any questions, ask your nurse or doctor.          aspirin-acetaminophen-caffeine 250-250-65 MG tablet Commonly known as: EXCEDRIN MIGRAINE Take 2 tablets by mouth 2 (two) times daily as needed for headache or migraine.   hydrochlorothiazide 25 MG tablet Commonly known as: HYDRODIURIL Take 1 tablet (25 mg total) by mouth daily.   levothyroxine 112 MCG tablet Commonly known as: SYNTHROID TAKE 1 TABLET BY MOUTH ONCE DAILY BEFORE BREAKFAST   metoprolol succinate 100 MG 24 hr tablet Commonly known as: TOPROL-XL TAKE 1  TABLET BY MOUTH ONCE DAILY WITH  OR  IMMEDIATELY  FOLLOWING  A  MEAL   mirtazapine 15 MG tablet Commonly known as: REMERON Take 1 tablet (15 mg total) by mouth at bedtime.   pravastatin 20 MG tablet Commonly known as: PRAVACHOL Take 1 tablet (20 mg total) by mouth daily.        Allergies:  Allergies  Allergen Reactions   Codeine Nausea And Vomiting   Oxycodone Nausea And Vomiting   Penicillins Nausea And Vomiting    Has patient had a PCN reaction causing  immediate rash, facial/tongue/throat swelling, SOB or lightheadedness with hypotension: No Has patient had a PCN reaction causing severe rash involving mucus membranes or skin necrosis: No Has patient had a PCN reaction that required hospitalization: No Has patient had a PCN reaction occurring within the last 10 years: No If all of the above answers are "NO", then may proceed with Cephalosporin use.    Tramadol Nausea And Vomiting   Trintellix [Vortioxetine] Itching    Family History: Family History  Problem Relation Age of Onset   Heart disease Mother    Diabetes Mother    Arthritis Sister    Hyperlipidemia Sister    COPD Brother    Diabetes Sister    Colon cancer Other        first cousin   Breast cancer Maternal Aunt    Breast cancer Paternal Aunt     Social History:  reports that she has been smoking cigarettes. She has never used smokeless tobacco. She reports current alcohol use. She reports that she does not use drugs.   Physical Exam: BP 130/82    Pulse 85    Ht 5\' 6"  (1.676 m)    Wt 173 lb (78.5 kg)    BMI 27.92 kg/m   Constitutional:  Alert and oriented, No acute distress. HEENT: [place] AT, moist mucus membranes.  Trachea midline, no masses. Cardiovascular: No clubbing, cyanosis, or edema. Respiratory: Normal respiratory effort, no increased work of breathing. GI: Abdomen is soft, nontender, nondistended, no abdominal masses GU: No CVA tenderness Skin: No rashes, bruises or suspicious lesions. Neurologic: Grossly intact, no focal deficits, moving all 4 extremities. Psychiatric: Normal mood and affect.   Assessment & Plan:   76 y.o. female with right distal ureteral calculi and right nephrolithiasis Scheduled for ureteroscopic stone removal next week and we will plan on treating all right-sided calculi The procedure was discussed in detail including potential risks of bleeding, infection and ureteral injury.  The possibility of ureteral stone fragments was discussed  and the need for a temporary stent postoperatively was discussed.  All questions were answered and she desires to proceed Urine sent for preop culture   Abbie Sons, MD  [facility] [address] [place], [place] [address] [phone]

## 2021-04-29 NOTE — H&P (View-Only) (Signed)
04/29/2021 10:05 PM   Melissa Fitzgerald March 29, 1945 [id number]  Referring provider: [name], PA-C [address] [place],  [place] [address]  Chief Complaint  Patient presents with   Nephrolithiasis    HPI: 76 y.o. female presents for preop visit.  Right ureteral stent placed 04/09/2021 for distal ureteral calculi x2 with pyuria Also with a nonobstructing right renal calculus No significant stent symptoms Denies fever, chills Scheduled for definitive stone treatment 05/07/2021 with ureteroscopy/laser lithotripsy/stent exchange   PMH: Past Medical History:  Diagnosis Date   Anemia    Colon polyp    Depression    Hyperlipidemia    Hypertension    Hypothyroidism    Irritable bowel disease    Kidney stone    Thyroid disease     Surgical History: Past Surgical History:  Procedure Laterality Date   ABDOMINAL HYSTERECTOMY     BREAST ENHANCEMENT SURGERY     BREAST SURGERY     CHOLECYSTECTOMY  2000   COLECTOMY  09/2013   COLONOSCOPY  09-13-13   Dr [name]   COLONOSCOPY WITH PROPOFOL N/A 10/18/2014   Procedure: COLONOSCOPY WITH PROPOFOL;  Surgeon: [name], MD;  Location: [facility];  Service: Endoscopy;  Laterality: N/A;   CYSTOSCOPY WITH URETEROSCOPY AND STENT PLACEMENT Right 04/09/2021   Procedure: CYSTOSCOPY WITH URETEROSCOPY AND STENT PLACEMENT;  Surgeon: Abbie Sons, MD;  Location: [facility];  Service: Urology;  Laterality: Right;   CYSTOSCOPY/URETEROSCOPY/HOLMIUM LASER/STENT PLACEMENT Right 06/23/2017   Procedure: CYSTOSCOPY/URETEROSCOPY/HOLMIUM LASER/STENT PLACEMENT;  Surgeon: Abbie Sons, MD;  Location: [facility];  Service: Urology;  Laterality: Right;   ESOPHAGOGASTRODUODENOSCOPY (EGD) WITH PROPOFOL N/A 01/03/2015   Procedure: ESOPHAGOGASTRODUODENOSCOPY (EGD) WITH PROPOFOL;  Surgeon: [name], MD;  Location: [facility];  Service: Endoscopy;  Laterality: N/A;   EYE SURGERY     bilateral cataract    FOOT SURGERY      KIDNEY STONE SURGERY     NASAL SINUS SURGERY     PORT-A-CATH REMOVAL     PORTACATH PLACEMENT  10/24/13   THYROID SURGERY     TONSILLECTOMY      Home Medications:  Allergies as of 04/29/2021       Reactions   Codeine Nausea And Vomiting   Oxycodone Nausea And Vomiting   Penicillins Nausea And Vomiting   Has patient had a PCN reaction causing immediate rash, facial/tongue/throat swelling, SOB or lightheadedness with hypotension: No Has patient had a PCN reaction causing severe rash involving mucus membranes or skin necrosis: No Has patient had a PCN reaction that required hospitalization: No Has patient had a PCN reaction occurring within the last 10 years: No If all of the above answers are "NO", then may proceed with Cephalosporin use.   Tramadol Nausea And Vomiting   Trintellix [vortioxetine] Itching        Medication List        Accurate as of April 29, 2021 10:05 PM. If you have any questions, ask your nurse or doctor.          aspirin-acetaminophen-caffeine 250-250-65 MG tablet Commonly known as: EXCEDRIN MIGRAINE Take 2 tablets by mouth 2 (two) times daily as needed for headache or migraine.   hydrochlorothiazide 25 MG tablet Commonly known as: HYDRODIURIL Take 1 tablet (25 mg total) by mouth daily.   levothyroxine 112 MCG tablet Commonly known as: SYNTHROID TAKE 1 TABLET BY MOUTH ONCE DAILY BEFORE BREAKFAST   metoprolol succinate 100 MG 24 hr tablet Commonly known as: TOPROL-XL TAKE 1  TABLET BY MOUTH ONCE DAILY WITH  OR  IMMEDIATELY  FOLLOWING  A  MEAL   mirtazapine 15 MG tablet Commonly known as: REMERON Take 1 tablet (15 mg total) by mouth at bedtime.   pravastatin 20 MG tablet Commonly known as: PRAVACHOL Take 1 tablet (20 mg total) by mouth daily.        Allergies:  Allergies  Allergen Reactions   Codeine Nausea And Vomiting   Oxycodone Nausea And Vomiting   Penicillins Nausea And Vomiting    Has patient had a PCN reaction causing  immediate rash, facial/tongue/throat swelling, SOB or lightheadedness with hypotension: No Has patient had a PCN reaction causing severe rash involving mucus membranes or skin necrosis: No Has patient had a PCN reaction that required hospitalization: No Has patient had a PCN reaction occurring within the last 10 years: No If all of the above answers are "NO", then may proceed with Cephalosporin use.    Tramadol Nausea And Vomiting   Trintellix [Vortioxetine] Itching    Family History: Family History  Problem Relation Age of Onset   Heart disease Mother    Diabetes Mother    Arthritis Sister    Hyperlipidemia Sister    COPD Brother    Diabetes Sister    Colon cancer Other        first cousin   Breast cancer Maternal Aunt    Breast cancer Paternal Aunt     Social History:  reports that she has been smoking cigarettes. She has never used smokeless tobacco. She reports current alcohol use. She reports that she does not use drugs.   Physical Exam: BP 130/82    Pulse 85    Ht 5\' 6"  (1.676 m)    Wt 173 lb (78.5 kg)    BMI 27.92 kg/m   Constitutional:  Alert and oriented, No acute distress. HEENT: [place] AT, moist mucus membranes.  Trachea midline, no masses. Cardiovascular: No clubbing, cyanosis, or edema. Respiratory: Normal respiratory effort, no increased work of breathing. GI: Abdomen is soft, nontender, nondistended, no abdominal masses GU: No CVA tenderness Skin: No rashes, bruises or suspicious lesions. Neurologic: Grossly intact, no focal deficits, moving all 4 extremities. Psychiatric: Normal mood and affect.   Assessment & Plan:   76 y.o. female with right distal ureteral calculi and right nephrolithiasis Scheduled for ureteroscopic stone removal next week and we will plan on treating all right-sided calculi The procedure was discussed in detail including potential risks of bleeding, infection and ureteral injury.  The possibility of ureteral stone fragments was discussed  and the need for a temporary stent postoperatively was discussed.  All questions were answered and she desires to proceed Urine sent for preop culture   Abbie Sons, MD  [facility] [address], [place], [place] [address] [phone]

## 2021-04-30 ENCOUNTER — Other Ambulatory Visit
Admission: RE | Admit: 2021-04-30 | Discharge: 2021-04-30 | Disposition: A | Discharge status: Home / Self Care | Payer: Medicare Other | Source: Ambulatory Visit | Attending: Urology | Admitting: Urology

## 2021-04-30 ENCOUNTER — Other Ambulatory Visit: Payer: Self-pay

## 2021-04-30 HISTORY — DX: Unspecified osteoarthritis, unspecified site: M19.90

## 2021-04-30 HISTORY — DX: Malignant (primary) neoplasm, unspecified: C80.1

## 2021-04-30 HISTORY — DX: Pneumonia, unspecified organism: J18.9

## 2021-04-30 HISTORY — DX: Personal history of urinary calculi: Z87.442

## 2021-04-30 HISTORY — DX: Headache, unspecified: R51.9

## 2021-04-30 LAB — MICROSCOPIC EXAMINATION

## 2021-04-30 LAB — URINALYSIS, COMPLETE
Bilirubin, UA: NEGATIVE
Glucose, UA: NEGATIVE
Ketones, UA: NEGATIVE
Nitrite, UA: NEGATIVE
Specific Gravity, UA: 1.025 (ref 1.005–1.030)
Urobilinogen, Ur: 0.2 mg/dL (ref 0.2–1.0)
pH, UA: 5.5 (ref 5.0–7.5)

## 2021-04-30 NOTE — Patient Instructions (Addendum)
Your procedure is scheduled on: 05/07/21 - Tuesday Report to the Registration Desk on the 1st floor of the [facility]. To find out your arrival time, please call [phone] between 1PM - 3PM on: 05/06/21 - Monday  REMEMBER: Instructions that are not followed completely may result in serious medical risk, up to and including death; or upon the discretion of your surgeon and anesthesiologist your surgery may need to be rescheduled.  Do not eat food or drink any fluids after midnight the night before surgery.  No gum chewing, lozengers or hard candies.  TAKE THESE MEDICATIONS THE MORNING OF SURGERY WITH A SIPs OF WATER:  - levothyroxine (SYNTHROID) 112 MCG tablet - metoprolol succinate (TOPROL-XL) 100 MG 24 hr tablet   One week prior to surgery: Stop Anti-inflammatories (NSAIDS) such as Advil, Aleve, Ibuprofen, Motrin, Naproxen, Naprosyn and Aspirin based products such as Excedrin, Goodys Powder, BC Powder.  Stop ANY OVER THE COUNTER supplements until after surgery.  You may however, continue to take Tylenol if needed for pain up until the day of surgery.  No Alcohol for 24 hours before or after surgery.  No Smoking including e-cigarettes for 24 hours prior to surgery.  No chewable tobacco products for at least 6 hours prior to surgery.  No nicotine patches on the day of surgery.  Do not use any "recreational" drugs for at least a week prior to your surgery.  Please be advised that the combination of cocaine and anesthesia may have negative outcomes, up to and including death. If you test positive for cocaine, your surgery will be cancelled.  On the morning of surgery brush your teeth with toothpaste and water, you may rinse your mouth with mouthwash if you wish. Do not swallow any toothpaste or mouthwash.  Do not wear jewelry, make-up, hairpins, clips or nail polish.  Do not wear lotions, powders, or perfumes.   Do not shave body from the neck down 48 hours prior to  surgery just in case you cut yourself which could leave a site for infection.  Also, freshly shaved skin may become irritated if using the CHG soap.  Contact lenses, hearing aids and dentures may not be worn into surgery.  Do not bring valuables to the hospital. [facility] is not responsible for any missing/lost belongings or valuables.   Notify your doctor if there is any change in your medical condition (cold, fever, infection).  Wear comfortable clothing (specific to your surgery type) to the hospital.  After surgery, you can help prevent lung complications by doing breathing exercises.  Take deep breaths and cough every 1-2 hours. Your doctor may order a device called an Incentive Spirometer to help you take deep breaths. When coughing or sneezing, hold a pillow firmly against your incision with both hands. This is called splinting. Doing this helps protect your incision. It also decreases belly discomfort.  If you are being admitted to the hospital overnight, leave your suitcase in the car. After surgery it may be brought to your room.  If you are being discharged the day of surgery, you will not be allowed to drive home. You will need a responsible adult (18 years or older) to drive you home and stay with you that night.   If you are taking public transportation, you will need to have a responsible adult (18 years or older) with you. Please confirm with your physician that it is acceptable to use public transportation.   Please call the [employer] [facility] at [phone]  [phone] if you have any questions about these instructions.  Surgery Visitation Policy:  Patients undergoing a surgery or procedure may have one family member or support person with them as long as that person is not COVID-19 positive or experiencing its symptoms.  That person may remain in the waiting area during the procedure and may rotate out with other people.  Inpatient Visitation:    Visiting  hours are 7 a.m. to 8 p.m. Up to two visitors ages 16+ are allowed at one time in a patient room. The visitors may rotate out with other people during the day. Visitors must check out when they leave, or other visitors will not be allowed. One designated support person may remain overnight. The visitor must pass COVID-19 screenings, use hand sanitizer when entering and exiting the patients room and wear a mask at all times, including in the patients room. Patients must also wear a mask when staff or their visitor are in the room. Masking is required regardless of vaccination status.

## 2021-05-02 LAB — CULTURE, URINE COMPREHENSIVE

## 2021-05-03 ENCOUNTER — Encounter: Payer: Self-pay | Admitting: Urgent Care

## 2021-05-03 ENCOUNTER — Other Ambulatory Visit
Admission: RE | Admit: 2021-05-03 | Discharge: 2021-05-03 | Disposition: A | Discharge status: Home / Self Care | Payer: Medicare Other | Source: Ambulatory Visit | Attending: Urology | Admitting: Urology

## 2021-05-03 ENCOUNTER — Other Ambulatory Visit: Payer: Self-pay

## 2021-05-05 ENCOUNTER — Encounter: Payer: Self-pay | Admitting: Urology

## 2021-05-06 ENCOUNTER — Encounter: Payer: Medicare Other | Admitting: Urology

## 2021-05-07 ENCOUNTER — Ambulatory Visit: Payer: Medicare Other

## 2021-05-07 ENCOUNTER — Ambulatory Visit: Payer: Medicare Other | Admitting: Anesthesiology

## 2021-05-07 ENCOUNTER — Other Ambulatory Visit: Payer: Self-pay

## 2021-05-07 ENCOUNTER — Encounter
Admission: RE | Disposition: A | Discharge status: Home / Self Care | Payer: Self-pay | Source: Home / Self Care | Attending: Urology

## 2021-05-07 ENCOUNTER — Encounter: Payer: Self-pay | Admitting: Urology

## 2021-05-07 ENCOUNTER — Ambulatory Visit
Admission: RE | Admit: 2021-05-07 | Discharge: 2021-05-07 | Disposition: A | Discharge status: Home / Self Care | Payer: Medicare Other | Attending: Urology | Admitting: Urology

## 2021-05-07 DIAGNOSIS — F17200 Nicotine dependence, unspecified, uncomplicated: Secondary | ICD-10-CM | POA: Insufficient documentation

## 2021-05-07 DIAGNOSIS — E785 Hyperlipidemia, unspecified: Secondary | ICD-10-CM | POA: Insufficient documentation

## 2021-05-07 DIAGNOSIS — E039 Hypothyroidism, unspecified: Secondary | ICD-10-CM | POA: Insufficient documentation

## 2021-05-07 DIAGNOSIS — F32A Depression, unspecified: Secondary | ICD-10-CM | POA: Insufficient documentation

## 2021-05-07 DIAGNOSIS — K219 Gastro-esophageal reflux disease without esophagitis: Secondary | ICD-10-CM | POA: Insufficient documentation

## 2021-05-07 DIAGNOSIS — N201 Calculus of ureter: Secondary | ICD-10-CM

## 2021-05-07 DIAGNOSIS — I10 Essential (primary) hypertension: Secondary | ICD-10-CM | POA: Insufficient documentation

## 2021-05-07 DIAGNOSIS — Z8711 Personal history of peptic ulcer disease: Secondary | ICD-10-CM | POA: Diagnosis not present

## 2021-05-07 DIAGNOSIS — N200 Calculus of kidney: Secondary | ICD-10-CM

## 2021-05-07 DIAGNOSIS — N202 Calculus of kidney with calculus of ureter: Secondary | ICD-10-CM | POA: Insufficient documentation

## 2021-05-07 HISTORY — PX: CYSTOSCOPY/URETEROSCOPY/HOLMIUM LASER/STENT PLACEMENT: SHX6546

## 2021-05-07 SURGERY — CYSTOSCOPY/URETEROSCOPY/HOLMIUM LASER/STENT PLACEMENT
Anesthesia: General | Laterality: Right

## 2021-05-07 MED ORDER — LACTATED RINGERS IV SOLN: INTRAVENOUS | Status: DC

## 2021-05-07 MED ORDER — CHLORHEXIDINE GLUCONATE 0.12 % MT SOLN
OROMUCOSAL | Status: AC
  Administered 2021-05-07: 15 mL via OROMUCOSAL
  Filled 2021-05-07: qty 15

## 2021-05-07 MED ORDER — LIDOCAINE HCL (CARDIAC) PF 100 MG/5ML IV SOSY
PREFILLED_SYRINGE | INTRAVENOUS | Status: DC | PRN
  Administered 2021-05-07: 60 mg via INTRAVENOUS

## 2021-05-07 MED ORDER — FENTANYL CITRATE (PF) 250 MCG/5ML IJ SOLN
INTRAMUSCULAR | Status: AC
  Filled 2021-05-07: qty 5

## 2021-05-07 MED ORDER — ORAL CARE MOUTH RINSE: 15.0000 mL | Freq: Once | OROMUCOSAL | Status: AC

## 2021-05-07 MED ORDER — LIDOCAINE HCL (PF) 2 % IJ SOLN
INTRAMUSCULAR | Status: AC
  Filled 2021-05-07: qty 5

## 2021-05-07 MED ORDER — ACETAMINOPHEN 10 MG/ML IV SOLN
INTRAVENOUS | Status: DC | PRN
  Administered 2021-05-07: 1000 mg via INTRAVENOUS

## 2021-05-07 MED ORDER — TAMSULOSIN HCL 0.4 MG PO CAPS: 0.4000 mg | ORAL_CAPSULE | Freq: Every day | ORAL | 0 refills | Status: DC

## 2021-05-07 MED ORDER — PROPOFOL 10 MG/ML IV BOLUS
INTRAVENOUS | Status: DC | PRN
  Administered 2021-05-07: 150 mg via INTRAVENOUS

## 2021-05-07 MED ORDER — ONDANSETRON HCL 4 MG/2ML IJ SOLN
INTRAMUSCULAR | Status: DC | PRN
  Administered 2021-05-07: 4 mg via INTRAVENOUS

## 2021-05-07 MED ORDER — FENTANYL CITRATE (PF) 100 MCG/2ML IJ SOLN: 25.0000 ug | INTRAMUSCULAR | Status: DC | PRN

## 2021-05-07 MED ORDER — DEXAMETHASONE SODIUM PHOSPHATE 10 MG/ML IJ SOLN
INTRAMUSCULAR | Status: DC | PRN
  Administered 2021-05-07: 5 mg via INTRAVENOUS

## 2021-05-07 MED ORDER — ROCURONIUM BROMIDE 100 MG/10ML IV SOLN
INTRAVENOUS | Status: DC | PRN
  Administered 2021-05-07: 40 mg via INTRAVENOUS

## 2021-05-07 MED ORDER — CHLORHEXIDINE GLUCONATE 0.12 % MT SOLN: 15.0000 mL | Freq: Once | OROMUCOSAL | Status: AC

## 2021-05-07 MED ORDER — PHENYLEPHRINE HCL (PRESSORS) 10 MG/ML IV SOLN
INTRAVENOUS | Status: DC | PRN
  Administered 2021-05-07 (×2): 80 ug via INTRAVENOUS

## 2021-05-07 MED ORDER — SUGAMMADEX SODIUM 200 MG/2ML IV SOLN
INTRAVENOUS | Status: DC | PRN
  Administered 2021-05-07: 200 mg via INTRAVENOUS

## 2021-05-07 MED ORDER — DEXAMETHASONE SODIUM PHOSPHATE 10 MG/ML IJ SOLN
INTRAMUSCULAR | Status: AC
  Filled 2021-05-07: qty 1

## 2021-05-07 MED ORDER — ACETAMINOPHEN 10 MG/ML IV SOLN
INTRAVENOUS | Status: AC
  Filled 2021-05-07: qty 100

## 2021-05-07 MED ORDER — PROPOFOL 10 MG/ML IV BOLUS
INTRAVENOUS | Status: AC
  Filled 2021-05-07: qty 20

## 2021-05-07 MED ORDER — FENTANYL CITRATE (PF) 100 MCG/2ML IJ SOLN
INTRAMUSCULAR | Status: DC | PRN
  Administered 2021-05-07: 50 ug via INTRAVENOUS

## 2021-05-07 MED ORDER — IOHEXOL 180 MG/ML  SOLN
INTRAMUSCULAR | Status: DC | PRN
  Administered 2021-05-07: 10 mL

## 2021-05-07 MED ORDER — ONDANSETRON HCL 4 MG/2ML IJ SOLN
INTRAMUSCULAR | Status: AC
  Filled 2021-05-07: qty 2

## 2021-05-07 MED ORDER — GENTAMICIN SULFATE 40 MG/ML IJ SOLN
5.0000 mg/kg | INTRAVENOUS | Status: AC
  Administered 2021-05-07: 408 mg via INTRAVENOUS
  Filled 2021-05-07: qty 10.25

## 2021-05-07 MED ORDER — FAMOTIDINE 20 MG PO TABS
ORAL_TABLET | ORAL | Status: AC
  Administered 2021-05-07: 20 mg via ORAL
  Filled 2021-05-07: qty 1

## 2021-05-07 MED ORDER — ONDANSETRON HCL 4 MG/2ML IJ SOLN: 4.0000 mg | Freq: Once | INTRAMUSCULAR | Status: DC | PRN

## 2021-05-07 MED ORDER — TROSPIUM CHLORIDE 20 MG PO TABS: 20.0000 mg | ORAL_TABLET | Freq: Two times a day (BID) | ORAL | 0 refills | Status: DC | PRN

## 2021-05-07 MED ORDER — FAMOTIDINE 20 MG PO TABS: 20.0000 mg | ORAL_TABLET | Freq: Once | ORAL | Status: AC

## 2021-05-07 SURGICAL SUPPLY — 31 items
BAG DRAIN CYSTO-URO LG1000N (MISCELLANEOUS) ×2 IMPLANT
BASKET ZERO TIP 1.9FR (BASKET) IMPLANT
BRUSH SCRUB EZ 1% IODOPHOR (MISCELLANEOUS) ×2 IMPLANT
BSKT STON RTRVL ZERO TP 1.9FR (BASKET)
CATH URET FLEX-TIP 2 LUMEN 10F (CATHETERS) IMPLANT
CATH URETL OPEN END 6X70 (CATHETERS) IMPLANT
CNTNR SPEC 2.5X3XGRAD LEK (MISCELLANEOUS)
CONT SPEC 4OZ STER OR WHT (MISCELLANEOUS)
CONT SPEC 4OZ STRL OR WHT (MISCELLANEOUS)
CONTAINER SPEC 2.5X3XGRAD LEK (MISCELLANEOUS) IMPLANT
DRAPE UTILITY 15X26 TOWEL STRL (DRAPES) ×2 IMPLANT
GAUZE 4X4 16PLY ~~LOC~~+RFID DBL (SPONGE) ×4 IMPLANT
GLOVE SURG UNDER POLY LF SZ7.5 (GLOVE) ×2 IMPLANT
GOWN STRL REUS W/ TWL LRG LVL3 (GOWN DISPOSABLE) ×1 IMPLANT
GOWN STRL REUS W/ TWL XL LVL3 (GOWN DISPOSABLE) ×1 IMPLANT
GOWN STRL REUS W/TWL LRG LVL3 (GOWN DISPOSABLE) ×2
GOWN STRL REUS W/TWL XL LVL3 (GOWN DISPOSABLE) ×2
GUIDEWIRE STR DUAL SENSOR (WIRE) ×2 IMPLANT
INFUSOR MANOMETER BAG 3000ML (MISCELLANEOUS) ×2 IMPLANT
IV NS IRRIG 3000ML ARTHROMATIC (IV SOLUTION) ×2 IMPLANT
KIT TURNOVER CYSTO (KITS) ×2 IMPLANT
PACK CYSTO AR (MISCELLANEOUS) ×2 IMPLANT
SET CYSTO W/LG BORE CLAMP LF (SET/KITS/TRAYS/PACK) ×2 IMPLANT
SHEATH URETERAL 12FRX35CM (MISCELLANEOUS) IMPLANT
STENT URET 6FRX22 CONTOUR (STENTS) ×1 IMPLANT
STENT URET 6FRX24 CONTOUR (STENTS) IMPLANT
STENT URET 6FRX26 CONTOUR (STENTS) IMPLANT
SURGILUBE 2OZ TUBE FLIPTOP (MISCELLANEOUS) ×2 IMPLANT
TRACTIP FLEXIVA PULSE ID 200 (Laser) ×2 IMPLANT
VALVE UROSEAL ADJ ENDO (VALVE) ×1 IMPLANT
WATER STERILE IRR 500ML POUR (IV SOLUTION) ×2 IMPLANT

## 2021-05-07 NOTE — Discharge Instructions (Addendum)
DISCHARGE INSTRUCTIONS FOR KIDNEY STONE/URETERAL STENT   MEDICATIONS:  1. Resume all your other meds from home.  2.  AZO (over-the-counter) can help with the burning/stinging when you urinate. 3.  Tamsulosin and trospium are for bladder irritation if needed, Rxs were sent to your pharmacy.  ACTIVITY:  1. May resume regular activities in 24 hours. 2. No driving while on narcotic pain medications  3. Drink plenty of water  4. Continue to walk at home - you can still get blood clots when you are at home, so keep active, but don't over do it.  5. May return to work/school tomorrow or when you feel ready   BATHING:  1. You can shower. 2. You have a string coming from your urethra: The stent string is attached to your ureteral stent. Do not pull on this.   SIGNS/SYMPTOMS TO CALL:  Common postoperative symptoms include urinary frequency, urgency, bladder spasm and blood in the urine  Please call us if you have a fever greater than 101.5, uncontrolled nausea/vomiting, uncontrolled pain, dizziness, unable to urinate, excessively bloody urine, chest pain, shortness of breath, leg swelling, leg pain, or any other concerns or questions.   You can reach [place] at [phone].   FOLLOW-UP:  1. You have a follow-up appointment scheduled 05/16/2021 2. You have a string attached to your stent, you may remove it on Friday, 05/10/2021. To do this, pull the string until the stent is completely removed. You may feel an odd sensation in your back.  If you are uncomfortable with removing the stent then please contact our office and a time will be scheduled for you to have our office staff remove.   AMBULATORY SURGERY  DISCHARGE INSTRUCTIONS   The drugs that you were given will stay in your system until tomorrow so for the next 24 hours you should not:  Drive an automobile Make any legal decisions Drink any alcoholic beverage   You may resume regular meals tomorrow.  Today it is better to start with  liquids and gradually work up to solid foods.  You may eat anything you prefer, but it is better to start with liquids, then soup and crackers, and gradually work up to solid foods.   Please notify your doctor immediately if you have any unusual bleeding, trouble breathing, redness and pain at the surgery site, drainage, fever, or pain not relieved by medication.    Additional Instructions:  Please contact your physician with any problems or Same Day Surgery at [phone], Monday through Friday 6 am to 4 pm, or [place] at [facility] number at [phone].

## 2021-05-07 NOTE — Transfer of Care (Signed)
Immediate Anesthesia Transfer of Care Note  Patient: Melissa Fitzgerald  Procedure(s) Performed: CYSTOSCOPY/URETEROSCOPY/HOLMIUM LASER/STENT PLACEMENT (Right)  Patient Location: PACU  Anesthesia Type:General  Level of Consciousness: awake and patient cooperative  Airway & Oxygen Therapy: Patient Spontanous Breathing and Patient connected to face mask oxygen  Post-op Assessment: Report given to RN and Post -op Vital signs reviewed and stable  Post vital signs: Reviewed and stable  Last Vitals:  Vitals Value Taken Time  BP 138/78 05/07/21 1149  Temp    Pulse 57 05/07/21 1149  Resp 16 05/07/21 1149  SpO2 100 % 05/07/21 1149  Vitals shown include unvalidated device data.  Last Pain:  Vitals:   05/07/21 0918  TempSrc: Temporal  PainSc: 0-No pain         Complications: No notable events documented.

## 2021-05-07 NOTE — Anesthesia Postprocedure Evaluation (Signed)
Anesthesia Post Note  Patient: Melissa Fitzgerald  Procedure(s) Performed: CYSTOSCOPY/URETEROSCOPY/HOLMIUM LASER/STENT PLACEMENT (Right)  Patient location during evaluation: PACU Anesthesia Type: General Level of consciousness: awake and alert Pain management: pain level controlled Vital Signs Assessment: post-procedure vital signs reviewed and stable Respiratory status: spontaneous breathing, nonlabored ventilation, respiratory function stable and patient connected to nasal cannula oxygen Cardiovascular status: blood pressure returned to baseline and stable Postop Assessment: no apparent nausea or vomiting Anesthetic complications: no   No notable events documented.   Last Vitals:  Vitals:   05/07/21 1216 05/07/21 1233  BP: 136/72 121/82  Pulse: (!) 56 (!) 51  Resp: 13 15  Temp: (!) 36.2 C (!) 36.2 C  SpO2: 96% 99%    Last Pain:  Vitals:   05/07/21 1233  TempSrc: Temporal  PainSc: 0-No pain                 Arita Miss

## 2021-05-07 NOTE — Op Note (Signed)
Preoperative diagnosis:  Right ureteral calculi Right nephrolithiasis  Postoperative diagnosis:  Right nephrolithiasis  Procedure: Cystoscopy with ureteral stent exchange. Right ureteroscopy with laser lithotripsy/stone removal  Surgeon: Abbie Sons, MD  Anesthesia: General  Complications: None  Intraoperative findings:  1.  Cystoscopy-mild inflammatory changes right hemitrigone secondary to indwelling stent.  No solid or papillary bladder mucosal lesions 2.  Right ureteroscopy-right ureteral calculi were not present 3.  Pyeloscopy-right lower pole calculus measuring 7 mm based on CT.  3 smaller lower pole calculi estimated at 2-4 mm  EBL: Minimal  Specimens: None  Indication: Melissa Fitzgerald is a 76 y.o. female status post right ureteral stent placement 04/09/2021 for distal ureteral calculi x2 with pyuria.  She also had nonobstructing right renal calculus.  She presents today for definitive stone management.  After reviewing the management options for treatment, he elected to proceed with the above surgical procedure(s). We have discussed the potential benefits and risks of the procedure, side effects of the proposed treatment, the likelihood of the patient achieving the goals of the procedure, and any potential problems that might occur during the procedure or recuperation. Informed consent has been obtained.  Description of procedure:  The patient was taken to the operating room and general anesthesia was induced.  The patient was placed in the dorsal lithotomy position, prepped and draped in the usual sterile fashion, and preoperative antibiotics were administered. A preoperative time-out was performed.   A 21 French cystoscope was lubricated and passed per urethra.  Cystoscopy was performed with findings as described above.  The right ureteral stent was grasped with endoscopic forceps and brought out through the urethral meatus.  A 0.038 Sensor wire was placed through the  stent and advanced into the right renal pelvis and fluoroscopic guidance.  The ureteral stent was removed.  A 4.5 French semirigid ureteroscope was lubricated and passed per urethra.  The scope was advanced to the UPJ and no calculus was identified.  The 4.5 [place] scope was removed and a single channel digital flexible ureteroscope was passed per urethra.  The ureteroscope was advanced alongside the guidewire and passed to the renal pelvis.  Pyeloscopy was performed with findings described above.  A 242 m holmium laser fiber was then placed through the ureteroscope and the 7 mm calculus was dusted at a setting of 0.3J/80Hz .  The remaining fragments were then treated with noncontact laser lithotripsy at a setting of 0.6J/40 Hz.  Once there were no fragments remaining larger than the typical laser fiber contrast was instilled through the ureteroscope.  There were no filling defects or extravasation identified.  All calyces were examined and no significant sized fragments were identified.  The ureteroscope was removed.  A 22F/22 cm Contour ureteral stent with tether was placed on fluoroscopic guidance.  After anesthetic reversal she was transported to the PACU in stable condition.  Plan: She was instructed to remove the stent on Friday, 05/10/2021. Postop follow-up schedule 05/16/2021   Abbie Sons, M.D.

## 2021-05-07 NOTE — Interval H&P Note (Signed)
History and Physical Interval Note:  CV:RRR Lungs:clear   05/07/2021 10:22 AM  Melissa Fitzgerald  has presented today for surgery, with the diagnosis of Right Ureteral Stone, Right Nephrolithiasis.  The various methods of treatment have been discussed with the patient and family. After consideration of risks, benefits and other options for treatment, the patient has consented to  Procedure(s): CYSTOSCOPY/URETEROSCOPY/HOLMIUM LASER/STENT PLACEMENT (Right) as a surgical intervention.  The patient's history has been reviewed, patient examined, no change in status, stable for surgery.  I have reviewed the patient's chart and labs.  Questions were answered to the patient's satisfaction.     [name]

## 2021-05-07 NOTE — Anesthesia Procedure Notes (Signed)
Procedure Name: Intubation Date/Time: 05/07/2021 10:47 AM Performed by: Loletha Grayer, CRNA Pre-anesthesia Checklist: Patient identified, Patient being monitored, Timeout performed, Emergency Drugs available and Suction available Patient Re-evaluated:Patient Re-evaluated prior to induction Oxygen Delivery Method: Circle system utilized Preoxygenation: Pre-oxygenation with 100% oxygen Induction Type: IV induction Ventilation: Mask ventilation without difficulty Laryngoscope Size: 3 and McGraph Grade View: Grade I Tube type: Oral Tube size: 6.5 mm Number of attempts: 1 Airway Equipment and Method: Stylet Placement Confirmation: ETT inserted through vocal cords under direct vision, positive ETCO2 and breath sounds checked- equal and bilateral Secured at: 20 cm Tube secured with: Tape Dental Injury: Teeth and Oropharynx as per pre-operative assessment

## 2021-05-07 NOTE — Anesthesia Preprocedure Evaluation (Signed)
Anesthesia Evaluation  Patient identified by MRN, date of birth, ID band Patient awake    Reviewed: Allergy & Precautions, NPO status , Patient's Chart, lab work & pertinent test results, reviewed documented beta blocker date and time   History of Anesthesia Complications (+) history of anesthetic complications (history of multiple attempts of DL for unexpected anterior airway. larygospasm noted on emergence last anesthetic. )  Airway Mallampati: II  TM Distance: >3 FB     Dental  (+) Chipped, Missing   Pulmonary neg sleep apnea, neg pneumonia  (Asymptomatic covid. Saturating 94 laying flat on RA), neg COPD, Current Smoker and Patient abstained from smoking., former smoker,    Pulmonary exam normal        Cardiovascular Exercise Tolerance: Good METShypertension, Pt. on medications and Pt. on home beta blockers (-) CAD and (-) Past MI Normal cardiovascular exam(-) dysrhythmias  Rhythm:Regular Rate:Normal     Neuro/Psych  Headaches, PSYCHIATRIC DISORDERS Depression    GI/Hepatic PUD, GERD  Controlled,(+)     (-) substance abuse  ,   Endo/Other  neg diabetesHypothyroidism   Renal/GU Renal disease (CKD)     Musculoskeletal  (+) Arthritis ,   Abdominal Normal abdominal exam  (+)   Peds  Hematology  (+) Blood dyscrasia, anemia ,   Anesthesia Other Findings Past Medical History: No date: Anemia No date: Arthritis No date: Cancer ([place]) No date: Colon polyp No date: Depression No date: Headache 04/09/2021: History of 2019 novel coronavirus disease (COVID-19) No date: History of kidney stones No date: Hyperlipidemia No date: Hypertension No date: Hypothyroidism No date: Irritable bowel disease No date: Kidney stone No date: Pneumonia No date: Thyroid disease  Reproductive/Obstetrics                             Anesthesia Physical  Anesthesia Plan  ASA: 3  Anesthesia Plan: General    Post-op Pain Management: Ofirmev IV (intra-op)*   Induction: Intravenous  PONV Risk Score and Plan: 2 and Ondansetron and Dexamethasone  Airway Management Planned: Oral ETT and Video Laryngoscope Planned  Additional Equipment: None  Intra-op Plan:   Post-operative Plan: Extubation in OR  Informed Consent: I have reviewed the patients History and Physical, chart, labs and discussed the procedure including the risks, benefits and alternatives for the proposed anesthesia with the patient or authorized representative who has indicated his/her understanding and acceptance.     Dental advisory given  Plan Discussed with: CRNA and Anesthesiologist  Anesthesia Plan Comments: (Discussed risks of anesthesia with patient, including PONV, sore throat, lip/dental/eye damage. Rare risks discussed as well, such as cardiorespiratory and neurological sequelae, and allergic reactions. Discussed the role of CRNA in patient's perioperative care. Patient understands.)        Anesthesia Quick Evaluation

## 2021-05-08 ENCOUNTER — Encounter: Payer: Self-pay | Admitting: Urology

## 2021-05-10 ENCOUNTER — Other Ambulatory Visit: Payer: Self-pay

## 2021-05-10 ENCOUNTER — Ambulatory Visit: Payer: Medicare Other

## 2021-05-10 DIAGNOSIS — Z466 Encounter for fitting and adjustment of urinary device: Secondary | ICD-10-CM

## 2021-05-10 NOTE — Patient Instructions (Signed)
Ureteral Stent Implantation, Care After This sheet gives you information about how to care for yourself after your procedure. Your health care provider may also give you more specific instructions. If you have problems or questions, contact your health care provider. What can I expect after the procedure? After the procedure, it is common to have: Nausea. Mild pain when you urinate. You may feel this pain in your lower back or lower abdomen. The pain should stop within a few minutes after you urinate. This may last for up to 1 week. A small amount of blood in your urine for several days. Follow these instructions at home: Medicines Take over-the-counter and prescription medicines only as told by your health care provider. If you were prescribed an antibiotic medicine, take it as told by your health care provider. Do not stop taking the antibiotic even if you start to feel better. Do not drive for 24 hours if you were given a sedative during your procedure. Ask your health care provider if the medicine prescribed to you requires you to avoid driving or using heavy machinery. Activity Rest as told by your health care provider. Avoid sitting for a long time without moving. Get up to take short walks every 1-2 hours. This is important to improve blood flow and breathing. Ask for help if you feel weak or unsteady. Return to your normal activities as told by your health care provider. Ask your health care provider what activities are safe for you. General instructions  Watch for any blood in your urine. Call your health care provider if the amount of blood in your urine increases. If you have a catheter: Follow instructions from your health care provider about taking care of your catheter and collection bag. Do not take baths, swim, or use a hot tub until your health care provider approves. Ask your health care provider if you may take showers. You may only be allowed to take sponge baths. Drink  enough fluid to keep your urine pale yellow. Do not use any products that contain nicotine or tobacco, such as cigarettes, e-cigarettes, and chewing tobacco. These can delay healing after surgery. If you need help quitting, ask your health care provider. Keep all follow-up visits as told by your health care provider. This is important. Contact a health care provider if: You have pain that gets worse or does not get better with medicine, especially pain when you urinate. You have difficulty urinating. You feel nauseous or you vomit repeatedly during a period of more than 2 days after the procedure. Get help right away if: Your urine is dark red or has blood clots in it. You are leaking urine (have incontinence). The end of the stent comes out of your urethra. You cannot urinate. You have sudden, sharp, or severe pain in your abdomen or lower back. You have a fever. You have swelling or pain in your legs. You have difficulty breathing. Summary After the procedure, it is common to have mild pain when you urinate that goes away within a few minutes after you urinate. This may last for up to 1 week. Watch for any blood in your urine. Call your health care provider if the amount of blood in your urine increases. Take over-the-counter and prescription medicines only as told by your health care provider. Drink enough fluid to keep your urine pale yellow. This information is not intended to replace advice given to you by your health care provider. Make sure you discuss any questions you have  with your health care provider. Document Revised: 12/15/2017 Document Reviewed: 12/16/2017 Elsevier Patient Education  2022 [employer].

## 2021-05-10 NOTE — Progress Notes (Signed)
Patient presents in office for removal of urethral stent on a tether. Patient placed in lithotomy position, stent easily removed. Patient tolerated well. No complications noted. Pt to return as scheduled.

## 2021-05-16 ENCOUNTER — Encounter: Payer: Self-pay | Admitting: Urology

## 2021-05-16 ENCOUNTER — Ambulatory Visit (INDEPENDENT_AMBULATORY_CARE_PROVIDER_SITE_OTHER): Payer: Medicare Other | Admitting: Urology

## 2021-05-16 ENCOUNTER — Other Ambulatory Visit: Payer: Self-pay

## 2021-05-16 VITALS — BP 122/80 | HR 75 | Ht 66.0 in | Wt 172.0 lb

## 2021-05-16 DIAGNOSIS — N200 Calculus of kidney: Secondary | ICD-10-CM

## 2021-05-16 NOTE — Progress Notes (Signed)
05/16/2021 10:02 AM   [name] December 23, 1945 [id number]  Referring provider: [name], PA-C [address] [place],  [place] [address]  Chief Complaint  Patient presents with   Follow-up   Urologic history: 1.  Recurrent nephrolithiasis Ureteroscopic removal 10 mm right mid ureteral calculus 06/2017 Ureteroscopic removal right renal calculus 04/2021  HPI: 76 y.o. female presents for postop follow-up.  Prior stent placement for obstructing ureteral calculi and pyuria Right ureteroscopy 05/07/2021.  The ureteral calculi were no longer present.  Right renal calculi were treated. Stent removed 05/10/2021 and no postoperative problems No complaints today   PMH: Past Medical History:  Diagnosis Date   Anemia    Arthritis    Cancer ([place])    Colon polyp    Depression    Headache    History of 2019 novel coronavirus disease (COVID-19) 04/09/2021   History of kidney stones    Hyperlipidemia    Hypertension    Hypothyroidism    Irritable bowel disease    Kidney stone    Pneumonia    Thyroid disease     Surgical History: Past Surgical History:  Procedure Laterality Date   ABDOMINAL HYSTERECTOMY     BREAST ENHANCEMENT SURGERY     BREAST SURGERY     CHOLECYSTECTOMY  2000   COLECTOMY  09/2013   COLONOSCOPY  09-13-13   Dr [name]   COLONOSCOPY WITH PROPOFOL N/A 10/18/2014   Procedure: COLONOSCOPY WITH PROPOFOL;  Surgeon: [name], MD;  Location: [facility];  Service: Endoscopy;  Laterality: N/A;   CYSTOSCOPY WITH URETEROSCOPY AND STENT PLACEMENT Right 04/09/2021   Procedure: CYSTOSCOPY WITH URETEROSCOPY AND STENT PLACEMENT;  Surgeon: Abbie Sons, MD;  Location: [facility];  Service: Urology;  Laterality: Right;   CYSTOSCOPY/URETEROSCOPY/HOLMIUM LASER/STENT PLACEMENT Right 06/23/2017   Procedure: CYSTOSCOPY/URETEROSCOPY/HOLMIUM LASER/STENT PLACEMENT;  Surgeon: Abbie Sons, MD;  Location: [facility];  Service: Urology;  Laterality: Right;    CYSTOSCOPY/URETEROSCOPY/HOLMIUM LASER/STENT PLACEMENT Right 05/07/2021   Procedure: CYSTOSCOPY/URETEROSCOPY/HOLMIUM LASER/STENT PLACEMENT;  Surgeon: Abbie Sons, MD;  Location: [facility];  Service: Urology;  Laterality: Right;   ESOPHAGOGASTRODUODENOSCOPY (EGD) WITH PROPOFOL N/A 01/03/2015   Procedure: ESOPHAGOGASTRODUODENOSCOPY (EGD) WITH PROPOFOL;  Surgeon: [name], MD;  Location: [facility];  Service: Endoscopy;  Laterality: N/A;   EYE SURGERY     bilateral cataract    FOOT SURGERY     KIDNEY STONE SURGERY     NASAL SINUS SURGERY     PORT-A-CATH REMOVAL     PORTACATH PLACEMENT  10/24/13   THYROID SURGERY     TONSILLECTOMY      Home Medications:  Allergies as of 05/16/2021       Reactions   Codeine Nausea And Vomiting   Oxycodone Nausea And Vomiting   Penicillins Nausea And Vomiting   Has patient had a PCN reaction causing immediate rash, facial/tongue/throat swelling, SOB or lightheadedness with hypotension: No Has patient had a PCN reaction causing severe rash involving mucus membranes or skin necrosis: No Has patient had a PCN reaction that required hospitalization: No Has patient had a PCN reaction occurring within the last 10 years: No If all of the above answers are "NO", then may proceed with Cephalosporin use.   Tramadol Nausea And Vomiting   Trintellix [vortioxetine] Itching        Medication List        Accurate as of May 16, 2021 10:02 AM. If you have any questions, ask your nurse or doctor.  aspirin-acetaminophen-caffeine 250-250-65 MG tablet Commonly known as: EXCEDRIN MIGRAINE Take 2 tablets by mouth 2 (two) times daily as needed for headache or migraine.   hydrochlorothiazide 25 MG tablet Commonly known as: HYDRODIURIL Take 1 tablet (25 mg total) by mouth daily.   levothyroxine 112 MCG tablet Commonly known as: SYNTHROID TAKE 1 TABLET BY MOUTH ONCE DAILY BEFORE BREAKFAST   metoprolol succinate 100 MG 24 hr  tablet Commonly known as: TOPROL-XL TAKE 1 TABLET BY MOUTH ONCE DAILY WITH  OR  IMMEDIATELY  FOLLOWING  A  MEAL   mirtazapine 15 MG tablet Commonly known as: REMERON Take 1 tablet (15 mg total) by mouth at bedtime.   pravastatin 20 MG tablet Commonly known as: PRAVACHOL Take 1 tablet (20 mg total) by mouth daily.   tamsulosin 0.4 MG Caps capsule Commonly known as: FLOMAX Take 1 capsule (0.4 mg total) by mouth daily after breakfast.   trospium 20 MG tablet Commonly known as: SANCTURA Take 1 tablet (20 mg total) by mouth 2 (two) times daily as needed (Bladder irritation, frequency, urgency).        Allergies:  Allergies  Allergen Reactions   Codeine Nausea And Vomiting   Oxycodone Nausea And Vomiting   Penicillins Nausea And Vomiting    Has patient had a PCN reaction causing immediate rash, facial/tongue/throat swelling, SOB or lightheadedness with hypotension: No Has patient had a PCN reaction causing severe rash involving mucus membranes or skin necrosis: No Has patient had a PCN reaction that required hospitalization: No Has patient had a PCN reaction occurring within the last 10 years: No If all of the above answers are "NO", then may proceed with Cephalosporin use.    Tramadol Nausea And Vomiting   Trintellix [Vortioxetine] Itching    Family History: Family History  Problem Relation Age of Onset   Heart disease Mother    Diabetes Mother    Arthritis Sister    Hyperlipidemia Sister    COPD Brother    Diabetes Sister    Colon cancer Other        first cousin   Breast cancer Maternal Aunt    Breast cancer Paternal Aunt     Social History:  reports that she has been smoking cigarettes. She has never used smokeless tobacco. She reports current alcohol use. She reports that she does not use drugs.   Physical Exam: BP 122/80    Pulse 75    Ht 5\' 6"  (1.676 m)    Wt 172 lb (78 kg)    BMI 27.76 kg/m   Constitutional:  Alert and oriented, No acute  distress. HEENT: [place] AT, moist mucus membranes.  Trachea midline, no masses. Cardiovascular: No clubbing, cyanosis, or edema. Respiratory: Normal respiratory effort, no increased work of breathing.    Assessment & Plan:    1.  Recurrent nephrolithiasis Discussed metabolic evaluation and she declined at this time.  We discussed general stone prevention guidelines and she was provided literature Follow-up 6 months with [place], MD  [facility] [address] [place], [place] [address] [phone]

## 2021-05-28 ENCOUNTER — Encounter: Payer: Self-pay | Admitting: Physician Assistant

## 2021-05-28 ENCOUNTER — Other Ambulatory Visit: Payer: Self-pay

## 2021-05-28 ENCOUNTER — Ambulatory Visit (INDEPENDENT_AMBULATORY_CARE_PROVIDER_SITE_OTHER): Payer: Medicare Other | Admitting: Physician Assistant

## 2021-05-28 VITALS — BP 129/89 | HR 72 | Ht 66.0 in | Wt 173.9 lb

## 2021-05-28 DIAGNOSIS — Z0000 Encounter for general adult medical examination without abnormal findings: Secondary | ICD-10-CM

## 2021-05-28 DIAGNOSIS — Z1211 Encounter for screening for malignant neoplasm of colon: Secondary | ICD-10-CM | POA: Diagnosis not present

## 2021-05-28 DIAGNOSIS — E782 Mixed hyperlipidemia: Secondary | ICD-10-CM | POA: Diagnosis not present

## 2021-05-28 DIAGNOSIS — D509 Iron deficiency anemia, unspecified: Secondary | ICD-10-CM | POA: Diagnosis not present

## 2021-05-28 DIAGNOSIS — F3341 Major depressive disorder, recurrent, in partial remission: Secondary | ICD-10-CM

## 2021-05-28 DIAGNOSIS — E890 Postprocedural hypothyroidism: Secondary | ICD-10-CM | POA: Diagnosis not present

## 2021-05-28 DIAGNOSIS — G479 Sleep disorder, unspecified: Secondary | ICD-10-CM

## 2021-05-28 MED ORDER — MIRTAZAPINE 30 MG PO TABS: 30.0000 mg | ORAL_TABLET | Freq: Every day | ORAL | 1 refills | Status: DC

## 2021-05-28 NOTE — Assessment & Plan Note (Addendum)
Previously thought to be IDA, but iron panel not checked recently, Hgb stable at a 10. Likely etiology for orthostatic dizziness and fatigue. ?Will recheck cbc, iron panel, vit b12, folate. ? ?

## 2021-05-28 NOTE — Assessment & Plan Note (Signed)
Historically, currently worsened. ?Pt reports prior improvement on Remeron at a higher dose ?We can cautiously increase to 30 mg nightly, monitor for daytime sleepiness. ?If no improvement, will refer to psych as likely not just depression.  ?F/u 8 weeks ? ?

## 2021-05-28 NOTE — Assessment & Plan Note (Signed)
Improved on mirtazapine.  ?See depression note.  ?

## 2021-05-28 NOTE — Progress Notes (Signed)
Complete physical exam   Patient: Melissa Fitzgerald   DOB: 28-Dec-1945   76 y.o. Female  MRN: [id number] Visit Date: 05/28/2021  Today's healthcare provider: [name], PA-C   Cc. cpe  Subjective    Melissa Fitzgerald is a 76 y.o. female who presents today for a complete physical exam.  She reports consuming a general and low sodium diet. The patient does not participate in regular exercise at present. She generally feels fairly well. She reports sleeping poorly. She does not have additional problems to discuss today.   Reports increase in depressive and anxious symptoms. Reports previously being on a higher dose of mirtazapine, which worked for her. Denies daytime sleepiness. Denies SI.   She also admits to overall fatigue and occasional dizziness when going from sitting to standing. Denies prolonged dizziness, denies falls, syncope.  PHQ9 SCORE ONLY 05/28/2021 01/16/2020 11/02/2019  PHQ-9 Total Score 8 0 6     Past Medical History:  Diagnosis Date   Anemia    Arthritis    Cancer ([place])    Colon polyp    Depression    Headache    History of 2019 novel coronavirus disease (COVID-19) 04/09/2021   History of kidney stones    Hyperlipidemia    Hypertension    Hypothyroidism    Irritable bowel disease    Kidney stone    Pneumonia    Thyroid disease    Past Surgical History:  Procedure Laterality Date   ABDOMINAL HYSTERECTOMY     BREAST ENHANCEMENT SURGERY     BREAST SURGERY     CHOLECYSTECTOMY  2000   COLECTOMY  09/2013   COLONOSCOPY  09-13-13   Dr [name]   COLONOSCOPY WITH PROPOFOL N/A 10/18/2014   Procedure: COLONOSCOPY WITH PROPOFOL;  Surgeon: [name], MD;  Location: [facility];  Service: Endoscopy;  Laterality: N/A;   CYSTOSCOPY WITH URETEROSCOPY AND STENT PLACEMENT Right 04/09/2021   Procedure: CYSTOSCOPY WITH URETEROSCOPY AND STENT PLACEMENT;  Surgeon: Abbie Sons, MD;  Location: [facility];  Service: Urology;  Laterality: Right;    CYSTOSCOPY/URETEROSCOPY/HOLMIUM LASER/STENT PLACEMENT Right 06/23/2017   Procedure: CYSTOSCOPY/URETEROSCOPY/HOLMIUM LASER/STENT PLACEMENT;  Surgeon: Abbie Sons, MD;  Location: [facility];  Service: Urology;  Laterality: Right;   CYSTOSCOPY/URETEROSCOPY/HOLMIUM LASER/STENT PLACEMENT Right 05/07/2021   Procedure: CYSTOSCOPY/URETEROSCOPY/HOLMIUM LASER/STENT PLACEMENT;  Surgeon: Abbie Sons, MD;  Location: [facility];  Service: Urology;  Laterality: Right;   ESOPHAGOGASTRODUODENOSCOPY (EGD) WITH PROPOFOL N/A 01/03/2015   Procedure: ESOPHAGOGASTRODUODENOSCOPY (EGD) WITH PROPOFOL;  Surgeon: [name], MD;  Location: [facility];  Service: Endoscopy;  Laterality: N/A;   EYE SURGERY     bilateral cataract    FOOT SURGERY     KIDNEY STONE SURGERY     NASAL SINUS SURGERY     PORT-A-CATH REMOVAL     PORTACATH PLACEMENT  10/24/13   THYROID SURGERY     TONSILLECTOMY     Social History   Socioeconomic History   Marital status: Widowed    Spouse name: Not on file   Number of children: 1   Years of education: Not on file   Highest education level: 12th grade  Occupational History   Occupation: retired  Tobacco Use   Smoking status: Some Days    Packs/day: 0.00    Types: Cigarettes    Last attempt to quit: 03/24/2016    Years since quitting: 5.1   Smokeless tobacco: Never   Tobacco comments:    still smoking every  and then, rarely one  °Vaping Use  ° Vaping Use: Never used  °Substance and Sexual Activity  ° Alcohol use: Yes  °  Alcohol/week: 0.0 standard drinks  °  Comment: occasionally  ° Drug use: No  ° Sexual activity: Yes  °Other Topics Concern  ° Not on file  °Social History Narrative  ° Not on file  ° °Social Determinants of Health  ° °Financial Resource Strain: Not on file  °Food Insecurity: Not on file  °Transportation Needs: Not on file  °Physical Activity: Not on file  °Stress: Not on file  °Social Connections: Not on file  °Intimate Partner Violence: Not on file   ° °Family Status  °Relation Name Status  ° Mother  Deceased at age 79  ° Father  Deceased  ° Sister  Alive  ° Brother  Alive  ° Sister  Deceased  ° Other  (Not Specified)  ° Mat Aunt  (Not Specified)  ° Pat Aunt  (Not Specified)  ° °Family History  °Problem Relation Age of Onset  ° Heart disease Mother   ° Diabetes Mother   ° Arthritis Sister   ° Hyperlipidemia Sister   ° COPD Brother   ° Diabetes Sister   ° Colon cancer Other   °     first cousin  ° Breast cancer Maternal Aunt   ° Breast cancer Paternal Aunt   ° °Allergies  °Allergen Reactions  ° Codeine Nausea And Vomiting  ° Oxycodone Nausea And Vomiting  ° Penicillins Nausea And Vomiting  °  Has patient had a PCN reaction causing immediate rash, facial/tongue/throat swelling, SOB or lightheadedness with hypotension: No °Has patient had a PCN reaction causing severe rash involving mucus membranes or skin necrosis: No °Has patient had a PCN reaction that required hospitalization: No °Has patient had a PCN reaction occurring within the last 10 years: No °If all of the above answers are "NO", then may proceed with Cephalosporin use. °  ° Tramadol Nausea And Vomiting  ° Trintellix [Vortioxetine] Itching  °  °Patient Care Team: °Drubel, Lindsay, PA-C as PCP - General (Physician Assistant) °Corcoran, Melissa C, MD (Inactive) as Referring Physician (Hematology and Oncology) °Thurmond, George S as Consulting Physician (Optometry) °Vaught, Creighton, MD as Referring Physician (Otolaryngology)  ° °Medications: °Outpatient Medications Prior to Visit  °Medication Sig  ° aspirin-acetaminophen-caffeine (EXCEDRIN MIGRAINE) 250-250-65 MG tablet Take 2 tablets by mouth 2 (two) times daily as needed for headache or migraine.  ° hydrochlorothiazide (HYDRODIURIL) 25 MG tablet Take 1 tablet (25 mg total) by mouth daily.  ° levothyroxine (SYNTHROID) 112 MCG tablet TAKE 1 TABLET BY MOUTH ONCE DAILY BEFORE BREAKFAST  ° metoprolol succinate (TOPROL-XL) 100 MG 24 hr tablet TAKE 1 TABLET  BY MOUTH ONCE DAILY WITH  OR  IMMEDIATELY  FOLLOWING  A  MEAL  ° pravastatin (PRAVACHOL) 20 MG tablet Take 1 tablet (20 mg total) by mouth daily.  ° [DISCONTINUED] mirtazapine (REMERON) 15 MG tablet Take 1 tablet (15 mg total) by mouth at bedtime.  ° [DISCONTINUED] tamsulosin (FLOMAX) 0.4 MG CAPS capsule Take 1 capsule (0.4 mg total) by mouth daily after breakfast. (Patient not taking: Reported on 05/28/2021)  ° [DISCONTINUED] trospium (SANCTURA) 20 MG tablet Take 1 tablet (20 mg total) by mouth 2 (two) times daily as needed (Bladder irritation, frequency, urgency). (Patient not taking: Reported on 05/28/2021)  ° °No facility-administered medications prior to visit.  ° ° °Review of Systems  °Constitutional:  Positive for activity change, appetite change and chills.  °  HENT:  Positive for postnasal drip, rhinorrhea, tinnitus and voice change.   °Eyes:  Positive for photophobia, itching and visual disturbance.  °Cardiovascular:  Positive for leg swelling.  °Gastrointestinal:  Positive for diarrhea.  °Endocrine: Positive for cold intolerance, heat intolerance and polyuria.  °Genitourinary:  Positive for dysuria.  °Musculoskeletal:  Positive for arthralgias and back pain.  °Skin:  Positive for color change.  °Allergic/Immunologic: Negative.   °Neurological:  Positive for dizziness, weakness, light-headedness, numbness and headaches.  °Hematological: Negative.   °Psychiatric/Behavioral:  Positive for decreased concentration, dysphoric mood and sleep disturbance. The patient is nervous/anxious.   °All other systems reviewed and are negative. °--When asked in visit if she had any concerns, only mentioned anxiety and depressive symptoms. ° °  °Blood pressure 129/89, pulse 72, height 5' 6" (1.676 m), weight 173 lb 14.4 oz (78.9 kg), SpO2 100 %.  ° ° °Physical Exam °Constitutional:   °   General: She is awake.  °   Appearance: She is well-developed.  °HENT:  °   Head: Normocephalic.  °   Right Ear: Tympanic membrane normal.  °    Left Ear: Tympanic membrane normal.  °Eyes:  °   Conjunctiva/sclera: Conjunctivae normal.  °   Pupils: Pupils are equal, round, and reactive to light.  °Neck:  °   Thyroid: No thyroid mass or thyromegaly.  °Cardiovascular:  °   Rate and Rhythm: Normal rate and regular rhythm.  °   Heart sounds: Normal heart sounds.  °Pulmonary:  °   Effort: Pulmonary effort is normal.  °   Breath sounds: Normal breath sounds.  °Abdominal:  °   Palpations: Abdomen is soft.  °   Tenderness: There is no abdominal tenderness.  °Musculoskeletal:  °   Right lower leg: No swelling.  °   Left lower leg: No swelling.  °Lymphadenopathy:  °   Cervical: No cervical adenopathy.  °Skin: °   General: Skin is warm.  °Neurological:  °   Mental Status: She is alert and oriented to person, place, and time.  °Psychiatric:     °   Attention and Perception: Attention normal.     °   Mood and Affect: Mood normal.     °   Speech: Speech is rapid and pressured and tangential.     °   Behavior: Behavior is cooperative.  ° °Last depression screening scores °PHQ 2/9 Scores 05/28/2021 01/16/2020 11/02/2019  °PHQ - 2 Score 2 0 2  °PHQ- 9 Score 8 - 6  °Exception Documentation - - -  ° °Last fall risk screening °Fall Risk  05/28/2021  °Falls in the past year? 0  °Number falls in past yr: 0  °Injury with Fall? 0  °Comment -  °Risk for fall due to : No Fall Risks  °Risk for fall due to: Comment -  °Follow up -  ° °Last Audit-C alcohol use screening °Alcohol Use Disorder Test (AUDIT) 05/28/2021  °1. How often do you have a drink containing alcohol? 1  °2. How many drinks containing alcohol do you have on a typical day when you are drinking? 0  °3. How often do you have six or more drinks on one occasion? 1  °AUDIT-C Score 2  °Alcohol Brief Interventions/Follow-up -  ° °A score of 3 or more in women, and 4 or more in men indicates increased risk for alcohol abuse, EXCEPT if all of the points are from question 1  ° °No results found for any visits   any visits on 05/28/21.  Assessment &  Plan    Routine Health Maintenance and Physical Exam  Exercise Activities and Dietary recommendations  Goals      Exercise      Recommend starting to exercise. Pt is going to start going to the gym 3 days a week for 1 hour.     Exercise 3x per week (30 min per time)     Recommend to start walking 3 days a week for at least 30 minutes at a time.         Immunization History  Administered Date(s) Administered   Fluad Quad(high Dose 65+) 01/04/2019   Influenza, High Dose Seasonal PF 02/04/2018   MMR 12/24/1994   PFIZER(Purple Top)SARS-COV-2 Vaccination 05/19/2019, 06/14/2019, 04/02/2020   Pneumococcal Conjugate-13 06/18/2016   Pneumococcal Polysaccharide-23 07/21/2017   Td 12/17/1994, 09/03/2004   Tdap 05/15/2014   Zoster, Live 09/30/2011    Health Maintenance  Topic Date Due   Zoster Vaccines- Shingrix (1 of 2) Never done   COLONOSCOPY (Pts 45-71yr Insurance coverage will need to be confirmed)  10/17/2017   INFLUENZA VACCINE  10/22/2020   COVID-19 Vaccine (4 - Booster for Pfizer series) 06/13/2021 (Originally 05/28/2020)   DEXA SCAN  03/24/2026 (Originally 01/22/2011)   TETANUS/TDAP  05/15/2024   Pneumonia Vaccine 76 Years old  Completed   Hepatitis C Screening  Completed   HPV VACCINES  Aged Out    Discussed health benefits of physical activity, and encouraged her to engage in regular exercise appropriate for her age and condition.  Problem List Items Addressed This Visit       Endocrine   Hypothyroidism, postop    Will recheck tsh/t4      Relevant Orders   TSH + free T4     Other   Sleep disturbance    Improved on mirtazapine.  See depression note.       Relevant Medications   mirtazapine (REMERON) 30 MG tablet   Anemia, iron deficiency    Previously thought to be IDA, but iron panel not checked recently, Hgb stable at a 10. Likely etiology for orthostatic dizziness and fatigue. Will recheck cbc, iron panel, vit b12, folate.       Relevant Orders    Iron, TIBC and Ferritin Panel   Vitamin B12   Folate   CBC   HLD (hyperlipidemia)    Will recheck lipid panel, continue pravastatin 20      Relevant Orders   Lipid Profile   Clinical depression    Historically, currently worsened. Pt reports prior improvement on Remeron at a higher dose We can cautiously increase to 30 mg nightly, monitor for daytime sleepiness. If no improvement, will refer to psych as likely not just depression.  F/u 8 weeks       Relevant Medications   mirtazapine (REMERON) 30 MG tablet   Other Visit Diagnoses     Encounter for health maintenance examination    -  Primary   Relevant Orders   Hepatic function panel   Encounter for screening colonoscopy       Relevant Orders   Ambulatory referral to Gastroenterology        Return in about 8 weeks (around 07/23/2021) for insomnia, depression.     I, LMikey Kirschner PA-C have reviewed all documentation for this visit. The documentation on  05/28/2021 for the exam, diagnosis, procedures, and orders are all accurate and complete.    LMikey Kirschner PA-C  [phone](phone) [phone](  [facility] ° °

## 2021-05-28 NOTE — Assessment & Plan Note (Signed)
Will recheck tsh/t4 

## 2021-05-28 NOTE — Assessment & Plan Note (Signed)
Will recheck lipid panel, continue pravastatin 20 ?

## 2021-05-29 ENCOUNTER — Other Ambulatory Visit: Payer: Self-pay | Admitting: Physician Assistant

## 2021-05-29 DIAGNOSIS — I10 Essential (primary) hypertension: Secondary | ICD-10-CM

## 2021-05-29 DIAGNOSIS — E782 Mixed hyperlipidemia: Secondary | ICD-10-CM

## 2021-05-29 DIAGNOSIS — E890 Postprocedural hypothyroidism: Secondary | ICD-10-CM

## 2021-05-29 LAB — LIPID PANEL
Chol/HDL Ratio: 3.6 ratio (ref 0.0–4.4)
Cholesterol, Total: 237 mg/dL — ABNORMAL HIGH (ref 100–199)
HDL: 65 mg/dL (ref >39)
LDL Chol Calc (NIH): 152 mg/dL — ABNORMAL HIGH (ref 0–99)
Triglycerides: 111 mg/dL (ref 0–149)
VLDL Cholesterol Cal: 20 mg/dL (ref 5–40)

## 2021-05-29 LAB — FOLATE: Folate: 10.2 ng/mL (ref >3.0)

## 2021-05-29 LAB — VITAMIN B12: Vitamin B-12: 326 pg/mL (ref 232–1245)

## 2021-05-29 LAB — IRON,TIBC AND FERRITIN PANEL
Ferritin: 99 ng/mL (ref 15–150)
Iron Saturation: 48 % (ref 15–55)
Iron: 141 ug/dL — ABNORMAL HIGH (ref 27–139)
Total Iron Binding Capacity: 292 ug/dL (ref 250–450)
UIBC: 151 ug/dL (ref 118–369)

## 2021-05-29 LAB — HEPATIC FUNCTION PANEL
ALT: 20 IU/L (ref 0–32)
AST: 22 IU/L (ref 0–40)
Albumin: 4.4 g/dL (ref 3.7–4.7)
Alkaline Phosphatase: 68 IU/L (ref 44–121)
Bilirubin Total: 0.4 mg/dL (ref 0.0–1.2)
Bilirubin, Direct: 0.11 mg/dL (ref 0.00–0.40)
Total Protein: 7.9 g/dL (ref 6.0–8.5)

## 2021-05-29 LAB — TSH+FREE T4
Free T4: 1.08 ng/dL (ref 0.82–1.77)
TSH: 0.319 u[IU]/mL — ABNORMAL LOW (ref 0.450–4.500)

## 2021-05-29 LAB — CBC
Hematocrit: 33 % — ABNORMAL LOW (ref 34.0–46.6)
Hemoglobin: 11.3 g/dL (ref 11.1–15.9)
MCH: 30.8 pg (ref 26.6–33.0)
MCHC: 34.2 g/dL (ref 31.5–35.7)
MCV: 90 fL (ref 79–97)
Platelets: 216 10*3/uL (ref 150–450)
RBC: 3.67 x10E6/uL — ABNORMAL LOW (ref 3.77–5.28)
RDW: 12.9 % (ref 11.7–15.4)
WBC: 4.5 10*3/uL (ref 3.4–10.8)

## 2021-05-29 MED ORDER — PRAVASTATIN SODIUM 40 MG PO TABS: 40.0000 mg | ORAL_TABLET | Freq: Every day | ORAL | 1 refills | Status: DC

## 2021-05-29 MED ORDER — METOPROLOL SUCCINATE ER 100 MG PO TB24: ORAL_TABLET | ORAL | 1 refills | Status: DC

## 2021-05-29 MED ORDER — HYDROCHLOROTHIAZIDE 25 MG PO TABS: 25.0000 mg | ORAL_TABLET | Freq: Every day | ORAL | 1 refills | Status: DC

## 2021-05-29 MED ORDER — LEVOTHYROXINE SODIUM 112 MCG PO TABS: ORAL_TABLET | ORAL | 1 refills | Status: DC

## 2021-06-26 ENCOUNTER — Encounter (INDEPENDENT_AMBULATORY_CARE_PROVIDER_SITE_OTHER): Payer: Self-pay

## 2021-07-02 ENCOUNTER — Telehealth: Payer: Self-pay | Admitting: Physician Assistant

## 2021-07-02 NOTE — Telephone Encounter (Signed)
Copied from [place] [id number]. Topic: Medicare AWV ?>> Jul 02, 2021  8:54 AM Cher Nakai R wrote: ?Reason for CRM:  ?Left message for patient to call back and schedule Medicare Annual Wellness Visit (AWV) in office.  ? ?If unable to come into the office for AWV,  please offer to do virtually or by telephone. ? ?Last AWV: 11/02/2019 ? ?Please schedule at anytime with [place]. ? ?30 minute appointment for Virtual or phone ?45 minute appointment for in office or Initial virtual/phone ? ?Any questions, please contact me at [phone] ?

## 2021-07-10 ENCOUNTER — Ambulatory Visit (INDEPENDENT_AMBULATORY_CARE_PROVIDER_SITE_OTHER): Payer: Medicare Other

## 2021-07-10 DIAGNOSIS — Z0000 Encounter for general adult medical examination without abnormal findings: Secondary | ICD-10-CM

## 2021-07-10 NOTE — Patient Instructions (Signed)
Melissa Fitzgerald , ?Thank you for taking time to come for your Medicare Wellness Visit. I appreciate your ongoing commitment to your health goals. Please review the following plan we discussed and let me know if I can assist you in the future.  ? ?Screening recommendations/referrals: ?Colonoscopy: 10/18/14, has one scheduled ?Mammogram: aged out ?Bone Density: declined  ?Recommended yearly ophthalmology/optometry visit for glaucoma screening and checkup ?Recommended yearly dental visit for hygiene and checkup ? ?Vaccinations: ?Influenza vaccine: n/c ?Pneumococcal vaccine: 07/21/17 ?Tdap vaccine: 05/15/14 ?Shingles vaccine: Zostavax 09/30/11   ?Covid-19:05/19/19, 06/14/19, 04/02/20 ? ?Advanced directives: no ? ?Conditions/risks identified: none ? ?Next appointment: Follow up in one year for your annual wellness visit 07/14/22 @ 8:45am by phone ? ? ?Preventive Care 76 Years and Older, Female ?Preventive care refers to lifestyle choices and visits with your health care provider that can promote health and wellness. ?What does preventive care include? ?A yearly physical exam. This is also called an annual well check. ?Dental exams once or twice a year. ?Routine eye exams. Ask your health care provider how often you should have your eyes checked. ?Personal lifestyle choices, including: ?Daily care of your teeth and gums. ?Regular physical activity. ?Eating a healthy diet. ?Avoiding tobacco and drug use. ?Limiting alcohol use. ?Practicing safe sex. ?Taking low-dose aspirin every day. ?Taking vitamin and mineral supplements as recommended by your health care provider. ?What happens during an annual well check? ?The services and screenings done by your health care provider during your annual well check will depend on your age, overall health, lifestyle risk factors, and family history of disease. ?Counseling  ?Your health care provider may ask you questions about your: ?Alcohol use. ?Tobacco use. ?Drug use. ?Emotional well-being. ?Home  and relationship well-being. ?Sexual activity. ?Eating habits. ?History of falls. ?Memory and ability to understand (cognition). ?Work and work Statistician. ?Reproductive health. ?Screening  ?You may have the following tests or measurements: ?Height, weight, and BMI. ?Blood pressure. ?Lipid and cholesterol levels. These may be checked every 5 years, or more frequently if you are over 45 years old. ?Skin check. ?Lung cancer screening. You may have this screening every year starting at age 76 if you have a 30-pack-year history of smoking and currently smoke or have quit within the past 15 years. ?Fecal occult blood test (FOBT) of the stool. You may have this test every year starting at age 76. ?Flexible sigmoidoscopy or colonoscopy. You may have a sigmoidoscopy every 5 years or a colonoscopy every 10 years starting at age 76. ?Hepatitis C blood test. ?Hepatitis B blood test. ?Sexually transmitted disease (STD) testing. ?Diabetes screening. This is done by checking your blood sugar (glucose) after you have not eaten for a while (fasting). You may have this done every 1-3 years. ?Bone density scan. This is done to screen for osteoporosis. You may have this done starting at age 76. ?Mammogram. This may be done every 1-2 years. Talk to your health care provider about how often you should have regular mammograms. ?Talk with your health care provider about your test results, treatment options, and if necessary, the need for more tests. ?Vaccines  ?Your health care provider may recommend certain vaccines, such as: ?Influenza vaccine. This is recommended every year. ?Tetanus, diphtheria, and acellular pertussis (Tdap, Td) vaccine. You may need a Td booster every 10 years. ?Zoster vaccine. You may need this after age 43. ?Pneumococcal 13-valent conjugate (PCV13) vaccine. One dose is recommended after age 76. ?Pneumococcal polysaccharide (PPSV23) vaccine. One dose is recommended after age 76. ?  Talk to your health care provider  about which screenings and vaccines you need and how often you need them. ?This information is not intended to replace advice given to you by your health care provider. Make sure you discuss any questions you have with your health care provider. ?Document Released: 04/06/2015 Document Revised: 11/28/2015 Document Reviewed: 01/09/2015 ?Elsevier Interactive Patient Education ? 2017 [place]. ? ?Fall Prevention in the Home ?Falls can cause injuries. They can happen to people of all ages. There are many things you can do to make your home safe and to help prevent falls. ?What can I do on the outside of my home? ?Regularly fix the edges of walkways and driveways and fix any cracks. ?Remove anything that might make you trip as you walk through a door, such as a raised step or threshold. ?Trim any bushes or trees on the path to your home. ?Use bright outdoor lighting. ?Clear any walking paths of anything that might make someone trip, such as rocks or tools. ?Regularly check to see if handrails are loose or broken. Make sure that both sides of any steps have handrails. ?Any raised decks and porches should have guardrails on the edges. ?Have any leaves, snow, or ice cleared regularly. ?Use sand or salt on walking paths during winter. ?Clean up any spills in your garage right away. This includes oil or grease spills. ?What can I do in the bathroom? ?Use night lights. ?Install grab bars by the toilet and in the tub and shower. Do not use towel bars as grab bars. ?Use non-skid mats or decals in the tub or shower. ?If you need to sit down in the shower, use a plastic, non-slip stool. ?Keep the floor dry. Clean up any water that spills on the floor as soon as it happens. ?Remove soap buildup in the tub or shower regularly. ?Attach bath mats securely with double-sided non-slip rug tape. ?Do not have throw rugs and other things on the floor that can make you trip. ?What can I do in the bedroom? ?Use night lights. ?Make sure  that you have a light by your bed that is easy to reach. ?Do not use any sheets or blankets that are too big for your bed. They should not hang down onto the floor. ?Have a firm chair that has side arms. You can use this for support while you get dressed. ?Do not have throw rugs and other things on the floor that can make you trip. ?What can I do in the kitchen? ?Clean up any spills right away. ?Avoid walking on wet floors. ?Keep items that you use a lot in easy-to-reach places. ?If you need to reach something above you, use a strong step stool that has a grab bar. ?Keep electrical cords out of the way. ?Do not use floor polish or wax that makes floors slippery. If you must use wax, use non-skid floor wax. ?Do not have throw rugs and other things on the floor that can make you trip. ?What can I do with my stairs? ?Do not leave any items on the stairs. ?Make sure that there are handrails on both sides of the stairs and use them. Fix handrails that are broken or loose. Make sure that handrails are as long as the stairways. ?Check any carpeting to make sure that it is firmly attached to the stairs. Fix any carpet that is loose or worn. ?Avoid having throw rugs at the top or bottom of the stairs. If you do have throw  rugs, attach them to the floor with carpet tape. ?Make sure that you have a light switch at the top of the stairs and the bottom of the stairs. If you do not have them, ask someone to add them for you. ?What else can I do to help prevent falls? ?Wear shoes that: ?Do not have high heels. ?Have rubber bottoms. ?Are comfortable and fit you well. ?Are closed at the toe. Do not wear sandals. ?If you use a stepladder: ?Make sure that it is fully opened. Do not climb a closed stepladder. ?Make sure that both sides of the stepladder are locked into place. ?Ask someone to hold it for you, if possible. ?Clearly mark and make sure that you can see: ?Any grab bars or handrails. ?First and last steps. ?Where the edge of  each step is. ?Use tools that help you move around (mobility aids) if they are needed. These include: ?Canes. ?Walkers. ?Scooters. ?Crutches. ?Turn on the lights when you go into a dark area. Replace any

## 2021-07-10 NOTE — Progress Notes (Signed)
Virtual Visit via Telephone Note  I connected with  Melissa Fitzgerald on 07/10/21 at  9:00 AM EDT by telephone and verified that I am speaking with the correct person using two identifiers.  Location: Patient: home Provider: [facility] Persons participating in the virtual visit: patient/Nurse Health Advisor   I discussed the limitations, risks, security and privacy concerns of performing an evaluation and management service by telephone and the availability of in person appointments. The patient expressed understanding and agreed to proceed.  Interactive audio and video telecommunications were attempted between this nurse and patient, however failed, due to patient having technical difficulties OR patient did not have access to video capability.  We continued and completed visit with audio only.  Some vital signs may be absent or patient reported.   Melissa S Barnes, LPN  Subjective:   Melissa Fitzgerald is a 76 y.o. female who presents for Medicare Annual (Subsequent) preventive examination.  Review of Systems     Cardiac Risk Factors include: advanced age (>55men, >65 women);dyslipidemia;hypertension     Objective:    There were no vitals filed for this visit. There is no height or weight on file to calculate BMI.     07/10/2021    9:10 AM 05/07/2021    9:21 AM 04/30/2021    1:01 PM 04/09/2021    2:00 PM 04/09/2021    9:02 AM 11/02/2019   11:16 AM 11/01/2018   10:41 AM  Advanced Directives  Does Patient Have a Medical Advance Directive? No Yes No No No Yes Yes  Type of Advance Directive  Living will    Living will Living will  Would patient like information on creating a medical advance directive? No - Patient declined   No - Patient declined No - Patient declined      Current Medications (verified) Outpatient Encounter Medications as of 07/10/2021  Medication Sig   aspirin-acetaminophen-caffeine (EXCEDRIN MIGRAINE) 250-250-65 MG tablet Take 2 tablets by mouth 2 (two) times daily as  needed for headache or migraine.   hydrochlorothiazide (HYDRODIURIL) 25 MG tablet Take 1 tablet (25 mg total) by mouth daily.   levothyroxine (SYNTHROID) 112 MCG tablet TAKE 1 TABLET BY MOUTH ONCE DAILY BEFORE BREAKFAST   metoprolol succinate (TOPROL-XL) 100 MG 24 hr tablet TAKE 1 TABLET BY MOUTH ONCE DAILY WITH  OR  IMMEDIATELY  FOLLOWING  A  MEAL   mirtazapine (REMERON) 30 MG tablet Take 1 tablet (30 mg total) by mouth at bedtime.   pravastatin (PRAVACHOL) 40 MG tablet Take 1 tablet (40 mg total) by mouth daily.   No facility-administered encounter medications on file as of 07/10/2021.    Allergies (verified) Codeine, Oxycodone, Penicillins, Tramadol, and Trintellix [vortioxetine]   History: Past Medical History:  Diagnosis Date   Anemia    Arthritis    Cancer (HCC)    Colon polyp    Depression    Headache    History of 2019 novel coronavirus disease (COVID-19) 04/09/2021   History of kidney stones    Hyperlipidemia    Hypertension    Hypothyroidism    Irritable bowel disease    Kidney stone    Pneumonia    Thyroid disease    Past Surgical History:  Procedure Laterality Date   ABDOMINAL HYSTERECTOMY     BREAST ENHANCEMENT SURGERY     BREAST SURGERY     CHOLECYSTECTOMY  2000   COLECTOMY  09/2013   COLONOSCOPY  09-13-13   Dr Skulski   COLONOSCOPY WITH PROPOFOL N/A   10/18/2014   Procedure: COLONOSCOPY WITH PROPOFOL;  Surgeon: Seeplaputhur G Sankar, MD;  Location: [facility];  Service: Endoscopy;  Laterality: N/A;   CYSTOSCOPY WITH URETEROSCOPY AND STENT PLACEMENT Right 04/09/2021   Procedure: CYSTOSCOPY WITH URETEROSCOPY AND STENT PLACEMENT;  Surgeon: Stoioff, Scott C, MD;  Location: [facility];  Service: Urology;  Laterality: Right;   CYSTOSCOPY/URETEROSCOPY/HOLMIUM LASER/STENT PLACEMENT Right 06/23/2017   Procedure: CYSTOSCOPY/URETEROSCOPY/HOLMIUM LASER/STENT PLACEMENT;  Surgeon: Stoioff, Scott C, MD;  Location: [facility];  Service: Urology;  Laterality: Right;    CYSTOSCOPY/URETEROSCOPY/HOLMIUM LASER/STENT PLACEMENT Right 05/07/2021   Procedure: CYSTOSCOPY/URETEROSCOPY/HOLMIUM LASER/STENT PLACEMENT;  Surgeon: Stoioff, Scott C, MD;  Location: [facility];  Service: Urology;  Laterality: Right;   ESOPHAGOGASTRODUODENOSCOPY (EGD) WITH PROPOFOL N/A 01/03/2015   Procedure: ESOPHAGOGASTRODUODENOSCOPY (EGD) WITH PROPOFOL;  Surgeon: Seeplaputhur G Sankar, MD;  Location: [facility];  Service: Endoscopy;  Laterality: N/A;   EYE SURGERY     bilateral cataract    FOOT SURGERY     KIDNEY STONE SURGERY     NASAL SINUS SURGERY     PORT-A-CATH REMOVAL     PORTACATH PLACEMENT  10/24/13   THYROID SURGERY     TONSILLECTOMY     Family History  Problem Relation Age of Onset   Heart disease Mother    Diabetes Mother    Arthritis Sister    Hyperlipidemia Sister    COPD Brother    Diabetes Sister    Colon cancer Other        first cousin   Breast cancer Maternal Aunt    Breast cancer Paternal Aunt    Social History   Socioeconomic History   Marital status: Widowed    Spouse name: Not on file   Number of children: 1   Years of education: Not on file   Highest education level: 12th grade  Occupational History   Occupation: retired  Tobacco Use   Smoking status: Some Days    Packs/day: 0.00    Types: Cigarettes    Last attempt to quit: 03/24/2016    Years since quitting: 5.2   Smokeless tobacco: Never   Tobacco comments:    still smoking every now and then, rarely one  Vaping Use   Vaping Use: Never used  Substance and Sexual Activity   Alcohol use: Yes    Alcohol/week: 0.0 standard drinks    Comment: occasionally   Drug use: No   Sexual activity: Yes  Other Topics Concern   Not on file  Social History Narrative   Not on file   Social Determinants of Health   Financial Resource Strain: Low Risk    Difficulty of Paying Living Expenses: Not hard at all  Food Insecurity: No Food Insecurity   Worried About Running Out of Food in the Last Year:  Never true   Ran Out of Food in the Last Year: Never true  Transportation Needs: No Transportation Needs   Lack of Transportation (Medical): No   Lack of Transportation (Non-Medical): No  Physical Activity: Insufficiently Active   Days of Exercise per Week: 2 days   Minutes of Exercise per Session: 20 min  Stress: No Stress Concern Present   Feeling of Stress : Only a little  Social Connections: Socially Isolated   Frequency of Communication with Friends and Family: More than three times a week   Frequency of Social Gatherings with Friends and Family: Once a week   Attends Religious Services: Never   Active Member of Clubs or Organizations: No   Attends   Club or Organization Meetings: Never   Marital Status: Widowed    Tobacco Counseling Ready to quit: Not Answered Counseling given: Not Answered Tobacco comments: still smoking every now and then, rarely one   Clinical Intake:  Pre-visit preparation completed: Yes  Pain : No/denies pain     Nutritional Risks: None Diabetes: No  How often do you need to have someone help you when you read instructions, pamphlets, or other written materials from your doctor or pharmacy?: 1 - Never  Diabetic?no  Interpreter Needed?: No  Information entered by :: Melissa Barnes, LPN   Activities of Daily Living    07/10/2021    9:13 AM 05/28/2021    2:42 PM  In your present state of health, do you have any difficulty performing the following activities:  Hearing? 0 0  Vision? 1 1  Difficulty concentrating or making decisions? 1 1  Walking or climbing stairs? 1 1  Dressing or bathing? 0 0  Doing errands, shopping? 0 0  Preparing Food and eating ? N   Using the Toilet? N   In the past six months, have you accidently leaked urine? N   Do you have problems with loss of bowel control? N   Managing your Medications? N   Managing your Finances? N   Housekeeping or managing your Housekeeping? N     Patient Care Team: Drubel, Lindsay,  PA-C as PCP - General (Physician Assistant) Corcoran, Melissa C, MD (Inactive) as Referring Physician (Hematology and Oncology) Thurmond, George S as Consulting Physician (Optometry) Vaught, Creighton, MD as Referring Physician (Otolaryngology)  Indicate any recent Medical Services you may have received from other than [facility] providers in the past year (date may be approximate).     Assessment:   This is a routine wellness examination for [place].  Hearing/Vision screen Hearing Screening - Comments:: No aids  Vision Screening - Comments:: Readers- Dr. Thurmond  Dietary issues and exercise activities discussed: Current Exercise Habits: Home exercise routine, Type of exercise: walking, Time (Minutes): 20, Frequency (Times/Week): 2, Weekly Exercise (Minutes/Week): 40, Intensity: Mild   Goals Addressed             This Visit's Progress    DIET - EAT MORE FRUITS AND VEGETABLES         Depression Screen    07/10/2021    9:06 AM 05/28/2021    2:42 PM 01/16/2020   12:54 PM 11/02/2019   11:07 AM 01/04/2019    2:54 PM 07/21/2017    2:01 PM 12/08/2016    4:16 PM  PHQ 2/9 Scores  PHQ - 2 Score 0 2 0 2 2 6 4  PHQ- 9 Score 3 8  6 10 16 18    Fall Risk    07/10/2021    9:11 AM 05/28/2021    2:42 PM 01/16/2020   12:54 PM 11/02/2019   11:17 AM 01/04/2019    3:15 PM  Fall Risk   Falls in the past year? 0 0 0 1 0  Number falls in past yr: 0 0  0 0  Injury with Fall? 0 0  0 0  Risk for fall due to : No Fall Risks No Fall Risks     Follow up Falls evaluation completed        FALL RISK PREVENTION PERTAINING TO THE HOME:  Any stairs in or around the home? No  If so, are there any without handrails? No  Home free of loose throw rugs in walkways, pet beds,   electrical cords, etc? Yes  Adequate lighting in your home to reduce risk of falls? Yes   ASSISTIVE DEVICES UTILIZED TO PREVENT FALLS:  Life alert? No  Use of a cane, walker or w/c? No  Grab bars in the bathroom? No  Shower chair or  bench in shower? No  Elevated toilet seat or a handicapped toilet? Yes   Cognitive Function:        11/01/2018   10:49 AM 06/18/2016    3:31 PM  6CIT Screen  What Year? 0 points 0 points  What month? 0 points 3 points  What time? 0 points 0 points  Count back from 20 0 points 0 points  Months in reverse 0 points 0 points  Repeat phrase 4 points 8 points  Total Score 4 points 11 points    Immunizations Immunization History  Administered Date(s) Administered   Fluad Quad(high Dose 65+) 01/04/2019   Influenza, High Dose Seasonal PF 02/04/2018   MMR 12/24/1994   PFIZER(Purple Top)SARS-COV-2 Vaccination 05/19/2019, 06/14/2019, 04/02/2020   Pneumococcal Conjugate-13 06/18/2016   Pneumococcal Polysaccharide-23 07/21/2017   Td 12/17/1994, 09/03/2004   Tdap 05/15/2014   Zoster, Live 09/30/2011    TDAP status: Up to date  Flu Vaccine status: Declined, Education has been provided regarding the importance of this vaccine but patient still declined. Advised may receive this vaccine at local pharmacy or Health Dept. Aware to provide a copy of the vaccination record if obtained from local pharmacy or Health Dept. Verbalized acceptance and understanding.  Pneumococcal vaccine status: Up to date  Covid-19 vaccine status: Completed vaccines  Qualifies for Shingles Vaccine? Yes   Zostavax completed Yes   Shingrix Completed?: No.    Education has been provided regarding the importance of this vaccine. Patient has been advised to call insurance company to determine out of pocket expense if they have not yet received this vaccine. Advised may also receive vaccine at local pharmacy or Health Dept. Verbalized acceptance and understanding.  Screening Tests Health Maintenance  Topic Date Due   Zoster Vaccines- Shingrix (1 of 2) Never done   COLONOSCOPY (Pts 45-23yr Insurance coverage will need to be confirmed)  10/17/2017   COVID-19 Vaccine (4 - Booster for Pfizer series) 05/28/2020   DEXA  SCAN  03/24/2026 (Originally 01/22/2011)   INFLUENZA VACCINE  10/22/2021   TETANUS/TDAP  05/15/2024   Pneumonia Vaccine 76 Years old  Completed   Hepatitis C Screening  Completed   HPV VACCINES  Aged Out    Health Maintenance  Health Maintenance Due  Topic Date Due   Zoster Vaccines- Shingrix (1 of 2) Never done   COLONOSCOPY (Pts 45-470yrInsurance coverage will need to be confirmed)  10/17/2017   COVID-19 Vaccine (4 - Booster for Pf[employer]ays [place]) 05/28/2020    Colorectal cancer screening: Type of screening: Colonoscopy. Completed 10/18/14. Repeat every 3 years- has one already scheduled  Mammogram status: No longer required due to age.  Declined [facility] referral  Lung Cancer Screening: (Low Dose CT Chest recommended if Age 76-80ears, 30 pack-year currently smoking OR have quit w/in 15years.) does not qualify.     Additional Screening:  Hepatitis C Screening: does qualify; Completed 04/26/13  Vision Screening: Recommended annual ophthalmology exams for early detection of glaucoma and other disorders of the eye. Is the patient up to date with their annual eye exam?  No  Who is the provider or what is the name of the office in which the patient attends annual eye exams? Dr.Thurmond If pt  is not established with a provider, would they like to be referred to a provider to establish care? No .   Dental Screening: Recommended annual dental exams for proper oral hygiene  Community Resource Referral / Chronic Care Management: CRR required this visit?  No   CCM required this visit?  No      Plan:     I have personally reviewed and noted the following in the patient's chart:   Medical and social history Use of alcohol, tobacco or illicit drugs  Current medications and supplements including opioid prescriptions.  Functional ability and status Nutritional status Physical activity Advanced directives List of other physicians Hospitalizations, surgeries, and ER visits in  previous 12 months Vitals Screenings to include cognitive, depression, and falls Referrals and appointments  In addition, I have reviewed and discussed with patient certain preventive protocols, quality metrics, and best practice recommendations. A written personalized care plan for preventive services as well as general preventive health recommendations were provided to patient.     Melissa S Barnes, LPN   07/10/2021   Nurse Notes: none      

## 2021-07-23 ENCOUNTER — Ambulatory Visit: Payer: Medicare Other | Admitting: Physician Assistant

## 2021-07-29 ENCOUNTER — Ambulatory Visit (INDEPENDENT_AMBULATORY_CARE_PROVIDER_SITE_OTHER): Payer: Medicare Other | Admitting: Ophthalmology

## 2021-07-29 ENCOUNTER — Encounter (INDEPENDENT_AMBULATORY_CARE_PROVIDER_SITE_OTHER): Payer: Self-pay | Admitting: Ophthalmology

## 2021-07-29 ENCOUNTER — Encounter (INDEPENDENT_AMBULATORY_CARE_PROVIDER_SITE_OTHER): Payer: Medicare Other | Admitting: Ophthalmology

## 2021-07-29 DIAGNOSIS — H43813 Vitreous degeneration, bilateral: Secondary | ICD-10-CM

## 2021-07-29 DIAGNOSIS — H33312 Horseshoe tear of retina without detachment, left eye: Secondary | ICD-10-CM | POA: Diagnosis not present

## 2021-07-29 NOTE — Assessment & Plan Note (Signed)
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  Floaters will be discussed with the patient.  I will explained to the patient that laser treatment to the retinal tear in the left eye is required to prevent retinal detachment from developing but that will have no impact nor will it change the occurrence of her floaters. ?

## 2021-07-29 NOTE — Assessment & Plan Note (Signed)
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  Confirmed clinically as well as on color fundus photography in the office with wide-field. ? ?OS will need laser retinopexy since patient is symptomatic floaters. ? ?We will also watch the region at 9:00 meridian for tractional change ?

## 2021-07-29 NOTE — Progress Notes (Signed)
? ? ?07/29/2021 ? ?  ? ?CHIEF COMPLAINT ?Patient presents for  ?Chief Complaint  ?Patient presents with  ? Retina Evaluation  ? ? ? ? ?HISTORY OF PRESENT ILLNESS: ?Melissa Fitzgerald is a 76 y.o. female who presents to the clinic today for:  ? ?HPI   ? ? Retina Evaluation   ? ?      ? Laterality: both eyes  ? Associated Symptoms: Floaters.  Negative for Flashes  ? ?  ?  ? ? Comments   ?NP- floaters OU, referred by Nelva Bush. ?Patient was seen by Dr. Gwynn Burly 06/26/2021. ?Patient was hospitalized in March 2023, she had a kidney infection and kidney stones. ?Patient has had floaters OU for "about 1 year" per patient. Patient states "the floaters are bad now. They are annoying, when I go somewhere, on a bright day it interferes with what I am seeing. It interferes with my driving too." ?Patient states "I was using Refresh tears. The doctor I was seeing told me to stop using those drops. Now I am using Equate artificial tears." ? ?  ?  ?Last edited by Laurin Coder on 07/29/2021  9:53 AM.  ?  ? ? ?Referring physician: ?Odette Fraction ?[address],  [place] [address] ? ?HISTORICAL INFORMATION:  ? ?Selected notes from the [facility] ?  ? ?Lab Results  ?Component Value Date  ? HGBA1C 5.6 04/26/2021  ?  ? ?CURRENT MEDICATIONS: ?No current outpatient medications on file. (Ophthalmic Drugs)  ? ?No current facility-administered medications for this visit. (Ophthalmic Drugs)  ? ?Current Outpatient Medications (Other)  ?Medication Sig  ? aspirin-acetaminophen-caffeine (EXCEDRIN MIGRAINE) 250-250-65 MG tablet Take 2 tablets by mouth 2 (two) times daily as needed for headache or migraine.  ? hydrochlorothiazide (HYDRODIURIL) 25 MG tablet Take 1 tablet (25 mg total) by mouth daily.  ? levothyroxine (SYNTHROID) 112 MCG tablet TAKE 1 TABLET BY MOUTH ONCE DAILY BEFORE BREAKFAST  ? metoprolol succinate (TOPROL-XL) 100 MG 24 hr tablet TAKE 1 TABLET BY MOUTH ONCE DAILY WITH  OR  IMMEDIATELY  FOLLOWING  A  MEAL  ?  mirtazapine (REMERON) 30 MG tablet Take 1 tablet (30 mg total) by mouth at bedtime.  ? pravastatin (PRAVACHOL) 40 MG tablet Take 1 tablet (40 mg total) by mouth daily.  ? ?No current facility-administered medications for this visit. (Other)  ? ? ? ? ?REVIEW OF SYSTEMS: ?ROS   ?Negative for: Constitutional, Gastrointestinal, Neurological, Skin, Genitourinary, Musculoskeletal, HENT, Endocrine, Cardiovascular, Eyes, Respiratory, Psychiatric, Allergic/Imm, Heme/Lymph ?Last edited by Hurman Horn, MD on 07/29/2021 10:34 AM.  ?  ? ? ? ?ALLERGIES ?Allergies  ?Allergen Reactions  ? Codeine Nausea And Vomiting  ? Oxycodone Nausea And Vomiting  ? Penicillins Nausea And Vomiting  ?  Has patient had a PCN reaction causing immediate rash, facial/tongue/throat swelling, SOB or lightheadedness with hypotension: No ?Has patient had a PCN reaction causing severe rash involving mucus membranes or skin necrosis: No ?Has patient had a PCN reaction that required hospitalization: No ?Has patient had a PCN reaction occurring within the last 10 years: No ?If all of the above answers are "NO", then may proceed with Cephalosporin use. ?  ? Tramadol Nausea And Vomiting  ? Trintellix [Vortioxetine] Itching  ? ? ?PAST MEDICAL HISTORY ?Past Medical History:  ?Diagnosis Date  ? Anemia   ? Arthritis   ? Cancer [facility])   ? Colon polyp   ? Depression   ? Headache   ? History of  2019 novel coronavirus disease (COVID-19) 04/09/2021  ? History of kidney stones   ? Hyperlipidemia   ? Hypertension   ? Hypothyroidism   ? Irritable bowel disease   ? Kidney stone   ? Pneumonia   ? Thyroid disease   ? ?Past Surgical History:  ?Procedure Laterality Date  ? ABDOMINAL HYSTERECTOMY    ? BREAST ENHANCEMENT SURGERY    ? BREAST SURGERY    ? CHOLECYSTECTOMY  2000  ? COLECTOMY  09/2013  ? COLONOSCOPY  09-13-13  ? Dr [name]  ? COLONOSCOPY WITH PROPOFOL N/A 10/18/2014  ? Procedure: COLONOSCOPY WITH PROPOFOL;  Surgeon: [name], MD;  Location: [facility];   Service: Endoscopy;  Laterality: N/A;  ? CYSTOSCOPY WITH URETEROSCOPY AND STENT PLACEMENT Right 04/09/2021  ? Procedure: CYSTOSCOPY WITH URETEROSCOPY AND STENT PLACEMENT;  Surgeon: Abbie Sons, MD;  Location: [facility];  Service: Urology;  Laterality: Right;  ? CYSTOSCOPY/URETEROSCOPY/HOLMIUM LASER/STENT PLACEMENT Right 06/23/2017  ? Procedure: CYSTOSCOPY/URETEROSCOPY/HOLMIUM LASER/STENT PLACEMENT;  Surgeon: Abbie Sons, MD;  Location: [facility];  Service: Urology;  Laterality: Right;  ? CYSTOSCOPY/URETEROSCOPY/HOLMIUM LASER/STENT PLACEMENT Right 05/07/2021  ? Procedure: CYSTOSCOPY/URETEROSCOPY/HOLMIUM LASER/STENT PLACEMENT;  Surgeon: Abbie Sons, MD;  Location: [facility];  Service: Urology;  Laterality: Right;  ? ESOPHAGOGASTRODUODENOSCOPY (EGD) WITH PROPOFOL N/A 01/03/2015  ? Procedure: ESOPHAGOGASTRODUODENOSCOPY (EGD) WITH PROPOFOL;  Surgeon: [name], MD;  Location: [facility];  Service: Endoscopy;  Laterality: N/A;  ? EYE SURGERY    ? bilateral cataract   ? FOOT SURGERY    ? KIDNEY STONE SURGERY    ? NASAL SINUS SURGERY    ? PORT-A-CATH REMOVAL    ? PORTACATH PLACEMENT  10/24/13  ? THYROID SURGERY    ? TONSILLECTOMY    ? ? ?FAMILY HISTORY ?Family History  ?Problem Relation Age of Onset  ? Heart disease Mother   ? Diabetes Mother   ? Arthritis Sister   ? Hyperlipidemia Sister   ? COPD Brother   ? Diabetes Sister   ? Colon cancer Other   ?     first cousin  ? Breast cancer Maternal Aunt   ? Breast cancer Paternal Aunt   ? ? ?SOCIAL HISTORY ?Social History  ? ?Tobacco Use  ? Smoking status: Some Days  ?  Packs/day: 0.00  ?  Types: Cigarettes  ?  Last attempt to quit: 03/24/2016  ?  Years since quitting: 5.3  ? Smokeless tobacco: Never  ? Tobacco comments:  ?  still smoking every now and then, rarely one  ?Vaping Use  ? Vaping Use: Never used  ?Substance Use Topics  ? Alcohol use: Yes  ?  Alcohol/week: 0.0 standard drinks  ?  Comment: occasionally  ? Drug use: No  ? ?  ? ?  ? ?OPHTHALMIC  EXAM: ? ?Base Eye Exam   ? ? Visual Acuity (ETDRS)   ? ?   Right Left  ? Dist [place] 20/25 20/30 -1  ? ?  ?  ? ? Tonometry (Tonopen, 9:55 AM)   ? ?   Right Left  ? Pressure 10 8  ? ?  ?  ? ? Pupils   ? ?   Pupils Dark Light APD  ? Right PERRL 4 3 None  ? Left PERRL 4 3 None  ? ?  ?  ? ? Visual Fields (Counting fingers)   ? ?   Left Right  ?  Full Full  ? ?  ?  ? ? Extraocular Movement   ? ?  Right Left  ?  Full Full  ? ?  ?  ? ? Neuro/Psych   ? ? Oriented x3: Yes  ? Mood/Affect: Normal  ? ?  ?  ? ? Dilation   ? ? Both eyes: 1.0% Mydriacyl, 2.5% Phenylephrine @ 9:55 AM  ? ?  ?  ? ?  ? ?Slit Lamp and Fundus Exam   ? ? External Exam   ? ?   Right Left  ? External Normal Normal  ? ?  ?  ? ? Slit Lamp Exam   ? ?   Right Left  ? Lids/Lashes Normal Normal  ? Conjunctiva/Sclera White and quiet White and quiet  ? Cornea Clear Clear  ? Anterior Chamber Deep and quiet Deep and quiet  ? Iris Round and reactive Round and reactive  ? Lens Centered posterior chamber intraocular lens, Open posterior capsule Centered posterior chamber intraocular lens, Open posterior capsule  ? Anterior Vitreous Normal Normal  ? ?  ?  ? ? Fundus Exam   ? ?   Right Left  ? Posterior Vitreous Posterior vitreous detachment Posterior vitreous detachment  ? Disc Normal Normal  ? C/D Ratio 0.55 0.55  ? Macula Normal Normal  ? Vessels Normal Normal  ? Periphery No holes or tears OD Horseshoe tear, at 7 meridian with traction, at equator And traction change anterior at 900  ? ?  ?  ? ?  ? ? ?IMAGING AND PROCEDURES  ?Imaging and Procedures for 07/29/21 ? ?OCT, Retina - OU - Both Eyes   ? ?   ?Right Eye ?Quality was good. Scan locations included subfoveal. Central Foveal Thickness: 264. Progression has no prior data. Findings include normal foveal contour.  ? ?Left Eye ?Quality was good. Scan locations included subfoveal. Central Foveal Thickness: 253. Progression has no prior data. Findings include normal foveal contour.  ? ?  ? ?Color Fundus Photography Optos  - OU - Both Eyes   ? ?   ?Right Eye ?Progression has no prior data. Disc findings include normal observations. Macula : normal observations. Vessels : normal observations. Periphery : normal observations.  ? ?Left

## 2021-08-30 ENCOUNTER — Encounter: Payer: Self-pay | Admitting: Physician Assistant

## 2021-08-30 ENCOUNTER — Telehealth: Payer: Self-pay | Admitting: *Deleted

## 2021-08-30 ENCOUNTER — Ambulatory Visit
Admission: RE | Admit: 2021-08-30 | Discharge: 2021-08-30 | Disposition: A | Discharge status: Home / Self Care | Payer: Medicare Other | Source: Ambulatory Visit | Attending: Physician Assistant | Admitting: Physician Assistant

## 2021-08-30 ENCOUNTER — Ambulatory Visit (INDEPENDENT_AMBULATORY_CARE_PROVIDER_SITE_OTHER): Payer: Medicare Other | Admitting: Physician Assistant

## 2021-08-30 VITALS — BP 111/71 | HR 63 | Ht 66.0 in | Wt 172.0 lb

## 2021-08-30 DIAGNOSIS — R109 Unspecified abdominal pain: Secondary | ICD-10-CM

## 2021-08-30 DIAGNOSIS — R310 Gross hematuria: Secondary | ICD-10-CM | POA: Diagnosis not present

## 2021-08-30 DIAGNOSIS — Z87442 Personal history of urinary calculi: Secondary | ICD-10-CM | POA: Diagnosis not present

## 2021-08-30 LAB — URINALYSIS, COMPLETE
Bilirubin, UA: NEGATIVE
Glucose, UA: NEGATIVE
Ketones, UA: NEGATIVE
Nitrite, UA: NEGATIVE
Protein,UA: NEGATIVE
Specific Gravity, UA: 1.02 (ref 1.005–1.030)
Urobilinogen, Ur: 0.2 mg/dL (ref 0.2–1.0)
pH, UA: 5 (ref 5.0–7.5)

## 2021-08-30 LAB — MICROSCOPIC EXAMINATION: RBC, Urine: >30 /hpf — AB (ref 0–2)

## 2021-08-30 MED ORDER — TAMSULOSIN HCL 0.4 MG PO CAPS: 0.4000 mg | ORAL_CAPSULE | Freq: Every day | ORAL | 0 refills | Status: DC

## 2021-08-30 MED ORDER — ONDANSETRON 4 MG PO TBDP: 4.0000 mg | ORAL_TABLET | Freq: Three times a day (TID) | ORAL | 0 refills | Status: DC | PRN

## 2021-08-30 MED ORDER — OXYCODONE-ACETAMINOPHEN 5-325 MG PO TABS: 1.0000 | ORAL_TABLET | ORAL | 0 refills | Status: AC | PRN

## 2021-08-30 NOTE — Telephone Encounter (Signed)
.  left message to have patient return my call.  

## 2021-08-30 NOTE — Telephone Encounter (Signed)
please call her and let her know I don't see a stone on her x-ray, so I'd like her to have a ct scan done? I put in the order and imaging will call to schedule  she should continue with the plan we came up with today, I really think this is a stone

## 2021-08-30 NOTE — Patient Instructions (Signed)
Go to the Radiology desk in the medical mall on your way out today to get an x-ray to look for a passing stone. I'll call you with your results this afternoon.  Let's go ahead and start treatment for a possible kidney stone. Please do the following: -Take Flomax 0.'4mg'$  daily as prescribed today -Stay well hydrated -Strain your urine to catch any stones that pass -Treat any pain with ibuprofen/tylenol or Percocet as prescribed today -Treat any nausea with Zofran as prescribed today  I will plan to see you back in clinic in 4 weeks with another x-ray prior to see if you have passed a stone.  Please call our office immediately (we are open 8a-5p Monday-Friday) or go to the Emergency Department if you develop any of the following: -Fever -Chills -Nausea and/or vomiting uncontrollable with Zofran -Pain uncontrollable with Percocet

## 2021-09-02 LAB — CULTURE, URINE COMPREHENSIVE

## 2021-09-02 NOTE — Telephone Encounter (Signed)
Spoke with patient and advised results   

## 2021-09-02 NOTE — Telephone Encounter (Signed)
Pt returned [facility] 6/9 call.

## 2021-09-05 NOTE — Progress Notes (Signed)
08/30/2021 9:00 AM   Melissa Fitzgerald 07/28/1945 [id number]  CC: Chief Complaint  Patient presents with   Follow-up   Hematuria   HPI: Melissa Fitzgerald is a 76 y.o. female with PMH recurrent nephrolithiasis who underwent right ureteroscopy with Dr. Bernardo Heater in February 2023 who presents today for evaluation of gross hematuria.   Today she reports left flank pain and gross hematuria 4 days ago that has since resolved.  She never saw stone pass.  She had some vomiting associated with this, but denies fever or chills.  She took ibuprofen for pain control and reports no pain now.  She states she always needs definitive stone management and has not had success with trials of passage.  She is expected to have several left renal stones per her most recent CT stone study dated 04/09/2021.  In-office UA today positive for 3+ blood and 1+ leukocyte esterase; urine microscopy with 11-30 WBCs/HPF, >30 RBCs/HPF, and few bacteria.   PMH: Past Medical History:  Diagnosis Date   Anemia    Arthritis    Cancer ([place])    Colon polyp    Depression    Headache    History of 2019 novel coronavirus disease (COVID-19) 04/09/2021   History of kidney stones    Hyperlipidemia    Hypertension    Hypothyroidism    Irritable bowel disease    Kidney stone    Pneumonia    Thyroid disease     Surgical History: Past Surgical History:  Procedure Laterality Date   ABDOMINAL HYSTERECTOMY     BREAST ENHANCEMENT SURGERY     BREAST SURGERY     CHOLECYSTECTOMY  2000   COLECTOMY  09/2013   COLONOSCOPY  09-13-13   Dr [name]   COLONOSCOPY WITH PROPOFOL N/A 10/18/2014   Procedure: COLONOSCOPY WITH PROPOFOL;  Surgeon: [name], MD;  Location: [facility];  Service: Endoscopy;  Laterality: N/A;   CYSTOSCOPY WITH URETEROSCOPY AND STENT PLACEMENT Right 04/09/2021   Procedure: CYSTOSCOPY WITH URETEROSCOPY AND STENT PLACEMENT;  Surgeon: Abbie Sons, MD;  Location: [facility];  Service: Urology;   Laterality: Right;   CYSTOSCOPY/URETEROSCOPY/HOLMIUM LASER/STENT PLACEMENT Right 06/23/2017   Procedure: CYSTOSCOPY/URETEROSCOPY/HOLMIUM LASER/STENT PLACEMENT;  Surgeon: Abbie Sons, MD;  Location: [facility];  Service: Urology;  Laterality: Right;   CYSTOSCOPY/URETEROSCOPY/HOLMIUM LASER/STENT PLACEMENT Right 05/07/2021   Procedure: CYSTOSCOPY/URETEROSCOPY/HOLMIUM LASER/STENT PLACEMENT;  Surgeon: Abbie Sons, MD;  Location: [facility];  Service: Urology;  Laterality: Right;   ESOPHAGOGASTRODUODENOSCOPY (EGD) WITH PROPOFOL N/A 01/03/2015   Procedure: ESOPHAGOGASTRODUODENOSCOPY (EGD) WITH PROPOFOL;  Surgeon: [name], MD;  Location: [facility];  Service: Endoscopy;  Laterality: N/A;   EYE SURGERY     bilateral cataract    FOOT SURGERY     KIDNEY STONE SURGERY     NASAL SINUS SURGERY     PORT-A-CATH REMOVAL     PORTACATH PLACEMENT  10/24/13   THYROID SURGERY     TONSILLECTOMY      Home Medications:  Allergies as of 08/30/2021       Reactions   Codeine Nausea And Vomiting   Oxycodone Nausea And Vomiting   Penicillins Nausea And Vomiting   Has patient had a PCN reaction causing immediate rash, facial/tongue/throat swelling, SOB or lightheadedness with hypotension: No Has patient had a PCN reaction causing severe rash involving mucus membranes or skin necrosis: No Has patient had a PCN reaction that required hospitalization: No Has patient had a PCN reaction occurring within the last 10 years:  No If all of the above answers are "NO", then may proceed with Cephalosporin use.   Tramadol Nausea And Vomiting   Trintellix [vortioxetine] Itching        Medication List        Accurate as of August 30, 2021 11:59 PM. If you have any questions, ask your nurse or doctor.          aspirin-acetaminophen-caffeine 250-250-65 MG tablet Commonly known as: EXCEDRIN MIGRAINE Take 2 tablets by mouth 2 (two) times daily as needed for headache or migraine.   hydrochlorothiazide 25  MG tablet Commonly known as: HYDRODIURIL Take 1 tablet (25 mg total) by mouth daily.   levothyroxine 112 MCG tablet Commonly known as: SYNTHROID TAKE 1 TABLET BY MOUTH ONCE DAILY BEFORE BREAKFAST   metoprolol succinate 100 MG 24 hr tablet Commonly known as: TOPROL-XL TAKE 1 TABLET BY MOUTH ONCE DAILY WITH  OR  IMMEDIATELY  FOLLOWING  A  MEAL   mirtazapine 30 MG tablet Commonly known as: REMERON Take 1 tablet (30 mg total) by mouth at bedtime.   ondansetron 4 MG disintegrating tablet Commonly known as: ZOFRAN-ODT Take 1 tablet (4 mg total) by mouth every 8 (eight) hours as needed for nausea or vomiting. Started by: Debroah Loop, PA-C   oxyCODONE-acetaminophen 5-325 MG tablet Commonly known as: PERCOCET/ROXICET Take 1 tablet by mouth every 4 (four) hours as needed for up to 5 days for severe pain. Started by: Debroah Loop, PA-C   pravastatin 40 MG tablet Commonly known as: PRAVACHOL Take 1 tablet (40 mg total) by mouth daily.   tamsulosin 0.4 MG Caps capsule Commonly known as: FLOMAX Take 1 capsule (0.4 mg total) by mouth daily. Started by: Debroah Loop, PA-C        Allergies:  Allergies  Allergen Reactions   Codeine Nausea And Vomiting   Oxycodone Nausea And Vomiting   Penicillins Nausea And Vomiting    Has patient had a PCN reaction causing immediate rash, facial/tongue/throat swelling, SOB or lightheadedness with hypotension: No Has patient had a PCN reaction causing severe rash involving mucus membranes or skin necrosis: No Has patient had a PCN reaction that required hospitalization: No Has patient had a PCN reaction occurring within the last 10 years: No If all of the above answers are "NO", then may proceed with Cephalosporin use.    Tramadol Nausea And Vomiting   Trintellix [Vortioxetine] Itching    Family History: Family History  Problem Relation Age of Onset   Heart disease Mother    Diabetes Mother    Arthritis Sister     Hyperlipidemia Sister    COPD Brother    Diabetes Sister    Colon cancer Other        first cousin   Breast cancer Maternal Aunt    Breast cancer Paternal Aunt     Social History:   reports that she has been smoking cigarettes. She has been exposed to tobacco smoke. She has never used smokeless tobacco. She reports current alcohol use. She reports that she does not use drugs.  Physical Exam: BP 111/71   Pulse 63   Ht '5\' 6"'$  (1.676 m)   Wt 172 lb (78 kg)   BMI 27.76 kg/m   Constitutional:  Alert and oriented, no acute distress, nontoxic appearing HEENT: [place], AT Cardiovascular: No clubbing, cyanosis, or edema Respiratory: Normal respiratory effort, no increased work of breathing Skin: No rashes, bruises or suspicious lesions Neurologic: Grossly intact, no focal deficits, moving all 4 extremities Psychiatric:  Normal mood and affect  Laboratory Data: Results for orders placed or performed in visit on 08/30/21  CULTURE, URINE COMPREHENSIVE   Specimen: Urine   UR  Result Value Ref Range   Urine Culture, Comprehensive Final report    Organism ID, Bacteria Lactobacillus species   Microscopic Examination   Urine  Result Value Ref Range   WBC, UA 11-30 (A) 0 - 5 /hpf   RBC >30 (A) 0 - 2 /hpf   Epithelial Cells (non renal) 0-10 0 - 10 /hpf   Bacteria, UA Few None seen/Few  Urinalysis, Complete  Result Value Ref Range   Specific Gravity, UA 1.020 1.005 - 1.030   pH, UA 5.0 5.0 - 7.5   Color, UA Yellow Yellow   Appearance Ur Clear Clear   Leukocytes,UA 1+ (A) Negative   Protein,UA Negative Negative/Trace   Glucose, UA Negative Negative   Ketones, UA Negative Negative   RBC, UA 3+ (A) Negative   Bilirubin, UA Negative Negative   Urobilinogen, Ur 0.2 0.2 - 1.0 mg/dL   Nitrite, UA Negative Negative   Microscopic Examination See below:    Assessment & Plan:   1. Flank pain with history of urolithiasis She is asymptomatic today but UA is consistent with stone episode with  RBCs> WBCs.  She is not clinically infected, though we will send urine for culture to be sure.  I would like to start her empirically on Flomax, Zofran, and Percocet for acute stone episode and have asked her to go get a KUB today on her way out of the hospital.  We will call her with her results.  Addendum: Her same-day KUB was negative for ureteral stone and I am ordering a CT stone study for further evaluation. - Urinalysis, Complete - DG Abd 1 View; Future - CULTURE, URINE COMPREHENSIVE - tamsulosin (FLOMAX) 0.4 MG CAPS capsule; Take 1 capsule (0.4 mg total) by mouth daily.  Dispense: 30 capsule; Refill: 0 - oxyCODONE-acetaminophen (PERCOCET/ROXICET) 5-325 MG tablet; Take 1 tablet by mouth every 4 (four) hours as needed for up to 5 days for severe pain.  Dispense: 10 tablet; Refill: 0 - ondansetron (ZOFRAN-ODT) 4 MG disintegrating tablet; Take 1 tablet (4 mg total) by mouth every 8 (eight) hours as needed for nausea or vomiting.  Dispense: 20 tablet; Refill: 0 - CT RENAL STONE STUDY; Future  Return for Will call with results.  Debroah Loop, PA-C  [facility] [address] [place], [place] [address] [phone]

## 2021-09-10 ENCOUNTER — Ambulatory Visit (INDEPENDENT_AMBULATORY_CARE_PROVIDER_SITE_OTHER): Payer: Medicare Other | Admitting: Ophthalmology

## 2021-09-10 ENCOUNTER — Encounter (INDEPENDENT_AMBULATORY_CARE_PROVIDER_SITE_OTHER): Payer: Self-pay | Admitting: Ophthalmology

## 2021-09-10 DIAGNOSIS — H43813 Vitreous degeneration, bilateral: Secondary | ICD-10-CM

## 2021-09-10 DIAGNOSIS — H33312 Horseshoe tear of retina without detachment, left eye: Secondary | ICD-10-CM

## 2021-09-10 NOTE — Assessment & Plan Note (Signed)
OD, no sign of hole or tear

## 2021-09-10 NOTE — Progress Notes (Signed)
09/10/2021     CHIEF COMPLAINT Patient presents for  Chief Complaint  Patient presents with   Retina Follow Up      HISTORY OF PRESENT ILLNESS: Melissa Fitzgerald is a 76 y.o. female who presents to the clinic today for:   HPI     Retina Follow Up           Diagnosis: Other (6 weeks Dilate OU, Color FP.)   Laterality: both eyes   Onset: 6 weeks ago   Severity: mild   Course: stable         Comments   6 weeks for Dilate OU, Color FP. Patient states vision is stable and unchanged since last visit. Denies any new floaters or FOL. Patient reports the floaters are still there, states "they don't bother me, in the light room I can see them and there are a lot of them."      Last edited by Laurin Coder on 09/10/2021 10:30 AM.      Referring physician: [name], PA-C [address] [place],  [place] [address]  HISTORICAL INFORMATION:   Selected notes from the MEDICAL RECORD NUMBER    Lab Results  Component Value Date   HGBA1C 5.6 04/26/2021     CURRENT MEDICATIONS: No current outpatient medications on file. (Ophthalmic Drugs)   No current facility-administered medications for this visit. (Ophthalmic Drugs)   Current Outpatient Medications (Other)  Medication Sig   aspirin-acetaminophen-caffeine (EXCEDRIN MIGRAINE) 250-250-65 MG tablet Take 2 tablets by mouth 2 (two) times daily as needed for headache or migraine.   hydrochlorothiazide (HYDRODIURIL) 25 MG tablet Take 1 tablet (25 mg total) by mouth daily.   levothyroxine (SYNTHROID) 112 MCG tablet TAKE 1 TABLET BY MOUTH ONCE DAILY BEFORE BREAKFAST   metoprolol succinate (TOPROL-XL) 100 MG 24 hr tablet TAKE 1 TABLET BY MOUTH ONCE DAILY WITH  OR  IMMEDIATELY  FOLLOWING  A  MEAL   mirtazapine (REMERON) 30 MG tablet Take 1 tablet (30 mg total) by mouth at bedtime.   ondansetron (ZOFRAN-ODT) 4 MG disintegrating tablet Take 1 tablet (4 mg total) by mouth every 8 (eight) hours as needed for nausea or  vomiting.   pravastatin (PRAVACHOL) 40 MG tablet Take 1 tablet (40 mg total) by mouth daily.   tamsulosin (FLOMAX) 0.4 MG CAPS capsule Take 1 capsule (0.4 mg total) by mouth daily.   No current facility-administered medications for this visit. (Other)      REVIEW OF SYSTEMS: ROS   Negative for: Constitutional, Gastrointestinal, Neurological, Skin, Genitourinary, Musculoskeletal, HENT, Endocrine, Cardiovascular, Eyes, Respiratory, Psychiatric, Allergic/Imm, Heme/Lymph Last edited by Hurman Horn, MD on 09/10/2021 11:19 AM.       ALLERGIES Allergies  Allergen Reactions   Codeine Nausea And Vomiting   Oxycodone Nausea And Vomiting   Penicillins Nausea And Vomiting    Has patient had a PCN reaction causing immediate rash, facial/tongue/throat swelling, SOB or lightheadedness with hypotension: No Has patient had a PCN reaction causing severe rash involving mucus membranes or skin necrosis: No Has patient had a PCN reaction that required hospitalization: No Has patient had a PCN reaction occurring within the last 10 years: No If all of the above answers are "NO", then may proceed with Cephalosporin use.    Tramadol Nausea And Vomiting   Trintellix [Vortioxetine] Itching    PAST MEDICAL HISTORY Past Medical History:  Diagnosis Date   Anemia    Arthritis    Cancer ([place])    Colon polyp  Depression    Headache    History of 2019 novel coronavirus disease (COVID-19) 04/09/2021   History of kidney stones    Hyperlipidemia    Hypertension    Hypothyroidism    Irritable bowel disease    Kidney stone    Pneumonia    Thyroid disease    Past Surgical History:  Procedure Laterality Date   ABDOMINAL HYSTERECTOMY     BREAST ENHANCEMENT SURGERY     BREAST SURGERY     CHOLECYSTECTOMY  2000   COLECTOMY  09/2013   COLONOSCOPY  09-13-13   Dr [name]   COLONOSCOPY WITH PROPOFOL N/A 10/18/2014   Procedure: COLONOSCOPY WITH PROPOFOL;  Surgeon: [name], MD;  Location:  [facility];  Service: Endoscopy;  Laterality: N/A;   CYSTOSCOPY WITH URETEROSCOPY AND STENT PLACEMENT Right 04/09/2021   Procedure: CYSTOSCOPY WITH URETEROSCOPY AND STENT PLACEMENT;  Surgeon: Abbie Sons, MD;  Location: [facility];  Service: Urology;  Laterality: Right;   CYSTOSCOPY/URETEROSCOPY/HOLMIUM LASER/STENT PLACEMENT Right 06/23/2017   Procedure: CYSTOSCOPY/URETEROSCOPY/HOLMIUM LASER/STENT PLACEMENT;  Surgeon: Abbie Sons, MD;  Location: [facility];  Service: Urology;  Laterality: Right;   CYSTOSCOPY/URETEROSCOPY/HOLMIUM LASER/STENT PLACEMENT Right 05/07/2021   Procedure: CYSTOSCOPY/URETEROSCOPY/HOLMIUM LASER/STENT PLACEMENT;  Surgeon: Abbie Sons, MD;  Location: [facility];  Service: Urology;  Laterality: Right;   ESOPHAGOGASTRODUODENOSCOPY (EGD) WITH PROPOFOL N/A 01/03/2015   Procedure: ESOPHAGOGASTRODUODENOSCOPY (EGD) WITH PROPOFOL;  Surgeon: [name], MD;  Location: [facility];  Service: Endoscopy;  Laterality: N/A;   EYE SURGERY     bilateral cataract    FOOT SURGERY     KIDNEY STONE SURGERY     NASAL SINUS SURGERY     PORT-A-CATH REMOVAL     PORTACATH PLACEMENT  10/24/13   THYROID SURGERY     TONSILLECTOMY      FAMILY HISTORY Family History  Problem Relation Age of Onset   Heart disease Mother    Diabetes Mother    Arthritis Sister    Hyperlipidemia Sister    COPD Brother    Diabetes Sister    Colon cancer Other        first cousin   Breast cancer Maternal Aunt    Breast cancer Paternal Aunt     SOCIAL HISTORY Social History   Tobacco Use   Smoking status: Some Days    Packs/day: 0.00    Types: Cigarettes    Last attempt to quit: 03/24/2016    Years since quitting: 5.4    Passive exposure: Current   Smokeless tobacco: Never   Tobacco comments:    still smoking every now and then, rarely one  Vaping Use   Vaping Use: Never used  Substance Use Topics   Alcohol use: Yes    Alcohol/week: 0.0 standard drinks of alcohol    Comment:  occasionally   Drug use: No         OPHTHALMIC EXAM:  Base Eye Exam     Visual Acuity (ETDRS)       Right Left   Dist [place] 20/20 -2 20/30 +2   Dist ph [place]  20/25         Tonometry (Tonopen, 10:32 AM)       Right Left   Pressure 8 6         Pupils       Pupils Dark Light APD   Right PERRL 4 3 None   Left PERRL 4 3 None         Extraocular Movement  Right Left    Full Full         Neuro/Psych     Oriented x3: Yes   Mood/Affect: Normal         Dilation     Both eyes: 1.0% Mydriacyl, 2.5% Phenylephrine @ 10:32 AM           Slit Lamp and Fundus Exam     External Exam       Right Left   External Normal Normal         Slit Lamp Exam       Right Left   Lids/Lashes Normal Normal   Conjunctiva/Sclera White and quiet White and quiet   Cornea Clear Clear   Anterior Chamber Deep and quiet Deep and quiet   Iris Round and reactive Round and reactive   Lens Centered posterior chamber intraocular lens, Open posterior capsule Centered posterior chamber intraocular lens, Open posterior capsule   Anterior Vitreous Normal Normal         Fundus Exam       Right Left   Posterior Vitreous Posterior vitreous detachment Posterior vitreous detachment   Disc Normal Normal   C/D Ratio 0.55 0.55   Macula Normal Normal   Vessels Normal Normal   Periphery No holes or tears OD to the ora serrata 25 D exam Horseshoe tear, at 7 meridian with traction, at equator And traction change anterior at 900, good retinopexy, no new breaks            IMAGING AND PROCEDURES  Imaging and Procedures for 09/10/21  Color Fundus Photography Optos - OU - Both Eyes       Right Eye Progression has no prior data. Disc findings include normal observations. Macula : normal observations. Vessels : normal observations. Periphery : normal observations.   Left Eye Progression has no prior data. Disc findings include normal observations. Macula : normal  observations. Vessels : normal observations.   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 Posterior vitreous detachment of both eyes  H43.813       1.  2.  3.  Ophthalmic Meds Ordered this visit:  No orders of the defined types were placed in this encounter.      Return in about 6 months (around 03/12/2022) for DILATE OU, COLOR FP.  There are no Patient Instructions on file for this visit.   Explained the diagnoses, plan, and follow up with the patient and they expressed understanding.  Patient expressed understanding of the importance of proper follow up care.   Clent Demark Konnor Vondrasek M.D. Diseases & Surgery of the Retina and Vitreous [facility] [place] 09/10/21     Abbreviations: M myopia (nearsighted); A astigmatism; H hyperopia (farsighted); P presbyopia; Mrx spectacle prescription;  CTL contact lenses; OD right eye; OS left eye; OU both eyes  XT exotropia; ET esotropia; PEK punctate epithelial keratitis; PEE punctate epithelial erosions; DES dry eye syndrome; MGD meibomian gland dysfunction; ATs artificial tears; PFAT's preservative free artificial tears; [place] nuclear sclerotic cataract; PSC posterior subcapsular cataract; ERM  epi-retinal membrane; PVD posterior vitreous detachment; RD retinal detachment; DM diabetes mellitus; DR diabetic retinopathy; NPDR non-proliferative diabetic retinopathy; PDR proliferative diabetic retinopathy; CSME clinically significant macular edema; DME diabetic macular edema; dbh dot blot hemorrhages; CWS cotton wool spot; POAG primary open angle  glaucoma; C/D cup-to-disc ratio; HVF humphrey visual field; GVF goldmann visual field; OCT optical coherence tomography; IOP intraocular pressure; BRVO Branch retinal vein occlusion; CRVO central retinal vein occlusion; CRAO central retinal artery occlusion; BRAO branch retinal artery occlusion; RT retinal tear; SB scleral buckle; PPV pars plana vitrectomy; VH Vitreous hemorrhage; PRP panretinal laser photocoagulation; IVK intravitreal kenalog; VMT vitreomacular traction; MH Macular hole;  NVD neovascularization of the disc; NVE neovascularization elsewhere; AREDS age related eye disease study; ARMD age related macular degeneration; POAG primary open angle glaucoma; EBMD epithelial/anterior basement membrane dystrophy; ACIOL anterior chamber intraocular lens; IOL intraocular lens; PCIOL posterior chamber intraocular lens; Phaco/IOL phacoemulsification with intraocular lens placement; [place] photorefractive keratectomy; LASIK laser assisted in situ keratomileusis; HTN hypertension; DM diabetes mellitus; COPD chronic obstructive pulmonary disease

## 2021-09-10 NOTE — Assessment & Plan Note (Signed)
Looks good good retinopexy

## 2021-09-23 ENCOUNTER — Ambulatory Visit
Admission: RE | Admit: 2021-09-23 | Discharge: 2021-09-23 | Disposition: A | Discharge status: Home / Self Care | Payer: Medicare Other | Source: Ambulatory Visit | Attending: Physician Assistant | Admitting: Physician Assistant

## 2021-09-23 DIAGNOSIS — R109 Unspecified abdominal pain: Secondary | ICD-10-CM | POA: Insufficient documentation

## 2021-09-23 DIAGNOSIS — Z87442 Personal history of urinary calculi: Secondary | ICD-10-CM | POA: Insufficient documentation

## 2021-09-23 DIAGNOSIS — R319 Hematuria, unspecified: Secondary | ICD-10-CM | POA: Diagnosis not present

## 2021-09-23 DIAGNOSIS — N281 Cyst of kidney, acquired: Secondary | ICD-10-CM | POA: Diagnosis not present

## 2021-09-23 DIAGNOSIS — N200 Calculus of kidney: Secondary | ICD-10-CM | POA: Diagnosis not present

## 2021-09-23 DIAGNOSIS — I700 Atherosclerosis of aorta: Secondary | ICD-10-CM | POA: Diagnosis not present

## 2021-10-01 ENCOUNTER — Other Ambulatory Visit: Payer: Medicare Other

## 2021-10-04 ENCOUNTER — Other Ambulatory Visit: Payer: Self-pay

## 2021-10-04 DIAGNOSIS — N200 Calculus of kidney: Secondary | ICD-10-CM

## 2021-10-07 ENCOUNTER — Other Ambulatory Visit: Payer: Medicare Other

## 2021-10-07 DIAGNOSIS — N200 Calculus of kidney: Secondary | ICD-10-CM | POA: Diagnosis not present

## 2021-10-08 LAB — MICROSCOPIC EXAMINATION: Epithelial Cells (non renal): >10 /hpf — AB (ref 0–10)

## 2021-10-08 LAB — URINALYSIS, COMPLETE
Bilirubin, UA: NEGATIVE
Glucose, UA: NEGATIVE
Ketones, UA: NEGATIVE
Nitrite, UA: NEGATIVE
Protein,UA: NEGATIVE
RBC, UA: NEGATIVE
Specific Gravity, UA: 1.02 (ref 1.005–1.030)
Urobilinogen, Ur: 0.2 mg/dL (ref 0.2–1.0)
pH, UA: 5 (ref 5.0–7.5)

## 2021-10-09 ENCOUNTER — Telehealth: Payer: Self-pay | Admitting: *Deleted

## 2021-10-09 NOTE — Telephone Encounter (Signed)
Patient notified and voiced understanding.

## 2021-10-09 NOTE — Telephone Encounter (Signed)
.  left message to have patient return my call.  

## 2021-10-09 NOTE — Telephone Encounter (Signed)
-----   Message from Debroah Loop, [place] sent at 10/08/2021  5:27 PM EDT ----- Hematuria has resolved, great news. She may follow up as needed.

## 2021-11-13 ENCOUNTER — Ambulatory Visit: Payer: Medicare Other | Admitting: Urology

## 2022-03-12 ENCOUNTER — Encounter (INDEPENDENT_AMBULATORY_CARE_PROVIDER_SITE_OTHER): Payer: Medicare Other | Admitting: Ophthalmology

## 2022-03-12 ENCOUNTER — Other Ambulatory Visit: Payer: Self-pay | Admitting: Physician Assistant

## 2022-03-12 DIAGNOSIS — I10 Essential (primary) hypertension: Secondary | ICD-10-CM

## 2022-03-12 NOTE — Telephone Encounter (Signed)
Medication Refill - Medication: metoprolol succinate (TOPROL-XL) 100 MG 24 hr tablet   Has the patient contacted their pharmacy? No.  Preferred Pharmacy (with phone number or street name):  [facility] [place] - [address] Phone: [phone]  Fax: [phone]     Has the patient been seen for an appointment in the last year OR does the patient have an upcoming appointment? Yes.    Agent: Please be advised that RX refills may take up to 3 business days. We ask that you follow-up with your pharmacy.

## 2022-03-13 NOTE — Telephone Encounter (Signed)
Requested medications are due for refill today.  yes  Requested medications are on the active medications list.  yes  Last refill. 05/29/2021 #90 1 rf  Future visit scheduled.   no  Notes to clinic.  Pt is more that 3 months over due for OV.     Requested Prescriptions  Pending Prescriptions Disp Refills   metoprolol succinate (TOPROL-XL) 100 MG 24 hr tablet 90 tablet 1    Sig: TAKE 1 TABLET BY MOUTH ONCE DAILY WITH  OR  IMMEDIATELY  FOLLOWING  A  MEAL     Cardiovascular:  Beta Blockers Failed - 03/12/2022  3:31 PM      Failed - Valid encounter within last 6 months    Recent Outpatient Visits           9 months ago Encounter for health maintenance examination   [facility] [name], PA-C   1 year ago Other specified counseling   [facility], Vickki Muff, PA-C   2 years ago Left hip pain   [facility], Vickki Muff, PA-C   2 years ago Arthritis of both hands   [facility], Vickki Muff, PA-C   2 years ago Recurrent major depressive disorder, in remission [facility])   [facility], Vickki Muff, PA-C              Passed - Last BP in normal range    BP Readings from Last 1 Encounters:  08/30/21 111/71         Passed - Last Heart Rate in normal range    Pulse Readings from Last 1 Encounters:  08/30/21 63

## 2022-03-13 NOTE — Telephone Encounter (Signed)
Called pt - [id number] for appointment. 

## 2022-03-14 NOTE — Telephone Encounter (Signed)
Patient need office visit.  

## 2022-03-25 ENCOUNTER — Ambulatory Visit
Admission: RE | Admit: 2022-03-25 | Discharge: 2022-03-25 | Disposition: A | Discharge status: Home / Self Care | Payer: Medicare Other | Attending: Urology | Admitting: Urology

## 2022-03-25 ENCOUNTER — Ambulatory Visit
Admission: RE | Admit: 2022-03-25 | Discharge: 2022-03-25 | Disposition: A | Discharge status: Home / Self Care | Payer: Medicare Other | Source: Ambulatory Visit | Attending: Urology | Admitting: Urology

## 2022-03-25 DIAGNOSIS — I878 Other specified disorders of veins: Secondary | ICD-10-CM | POA: Diagnosis not present

## 2022-03-25 DIAGNOSIS — N200 Calculus of kidney: Secondary | ICD-10-CM | POA: Diagnosis not present

## 2022-04-01 ENCOUNTER — Ambulatory Visit (INDEPENDENT_AMBULATORY_CARE_PROVIDER_SITE_OTHER): Payer: Medicare Other | Admitting: Physician Assistant

## 2022-04-01 ENCOUNTER — Encounter: Payer: Self-pay | Admitting: Physician Assistant

## 2022-04-01 VITALS — BP 153/88 | HR 67 | Ht 66.0 in | Wt 174.0 lb

## 2022-04-01 DIAGNOSIS — R739 Hyperglycemia, unspecified: Secondary | ICD-10-CM | POA: Insufficient documentation

## 2022-04-01 DIAGNOSIS — M25512 Pain in left shoulder: Secondary | ICD-10-CM | POA: Insufficient documentation

## 2022-04-01 DIAGNOSIS — I10 Essential (primary) hypertension: Secondary | ICD-10-CM

## 2022-04-01 DIAGNOSIS — E890 Postprocedural hypothyroidism: Secondary | ICD-10-CM

## 2022-04-01 DIAGNOSIS — G479 Sleep disorder, unspecified: Secondary | ICD-10-CM

## 2022-04-01 DIAGNOSIS — R7989 Other specified abnormal findings of blood chemistry: Secondary | ICD-10-CM | POA: Insufficient documentation

## 2022-04-01 DIAGNOSIS — E785 Hyperlipidemia, unspecified: Secondary | ICD-10-CM

## 2022-04-01 DIAGNOSIS — F3341 Major depressive disorder, recurrent, in partial remission: Secondary | ICD-10-CM

## 2022-04-01 MED ORDER — HYDROCHLOROTHIAZIDE 25 MG PO TABS: 25.0000 mg | ORAL_TABLET | Freq: Every day | ORAL | 1 refills | Status: DC

## 2022-04-01 MED ORDER — MELOXICAM 15 MG PO TABS: 15.0000 mg | ORAL_TABLET | Freq: Every day | ORAL | 0 refills | Status: DC

## 2022-04-01 MED ORDER — MIRTAZAPINE 45 MG PO TABS: 45.0000 mg | ORAL_TABLET | Freq: Every day | ORAL | 1 refills | Status: DC

## 2022-04-01 MED ORDER — METOPROLOL SUCCINATE ER 100 MG PO TB24: ORAL_TABLET | ORAL | 1 refills | Status: DC

## 2022-04-01 NOTE — Assessment & Plan Note (Signed)
Chronic, elevated today Previously controlled with hctz 25 mg and metoprolol 100 mg but pt has been without meds Refilled, encouraged her to check her bp at home

## 2022-04-01 NOTE — Assessment & Plan Note (Signed)
Well managed with mirtazapine

## 2022-04-01 NOTE — Assessment & Plan Note (Signed)
Will repeat tsh/t4 manages with levothyroxine 112 mcg

## 2022-04-01 NOTE — Assessment & Plan Note (Signed)
Pt admits to worsening symptoms May have cocurrent anxiety as well She prefers increasing mirtazapine dose before adding other meds Increase from 30 mg to 45 mg advised this is the max dose.  F/u 8 weeks

## 2022-04-01 NOTE — Assessment & Plan Note (Signed)
Will repeat fasting lipids today Managed with pravastatin 40 mg The 10-year ASCVD risk score (Arnett DK, et al., 2019) is: 43.5%

## 2022-04-01 NOTE — Assessment & Plan Note (Signed)
Advised heat, rx mobic daily prn  If persists can refer to PT

## 2022-04-01 NOTE — Progress Notes (Signed)
Established patient visit   Patient: Melissa Fitzgerald   DOB: 1945/03/27   77 y.o. Female  MRN: [id number] Visit Date: 04/01/2022  Today's healthcare provider: [name], PA-C   Cc. Hypertension fu  Subjective    HPI   Pt reports increased anxiety, depressive symptoms. She manages her depression and sleep with mirtazapine. Reports being on higher doses previously that helped.  Reports some difficulties with family life.   Hypertension, follow-up  BP Readings from Last 3 Encounters:  04/01/22 (!) 153/88  08/30/21 111/71  05/28/21 129/89   Wt Readings from Last 3 Encounters:  04/01/22 174 lb (78.9 kg)  08/30/21 172 lb (78 kg)  05/28/21 173 lb 14.4 oz (78.9 kg)     Pt reports being out of her blood pressure medicines for a few days.   Outside blood pressures are not being monitored.  Symptoms: No chest pain No chest pressure  No palpitations No syncope  No dyspnea No orthopnea  No paroxysmal nocturnal dyspnea No lower extremity edema   Pertinent labs Lab Results  Component Value Date   CHOL 237 (H) 05/28/2021   HDL 65 05/28/2021   LDLCALC 152 (H) 05/28/2021   TRIG 111 05/28/2021   CHOLHDL 3.6 05/28/2021   Lab Results  Component Value Date   NA 141 04/11/2021   K 3.8 04/11/2021   CREATININE 0.87 04/11/2021   GFRNONAA >60 04/11/2021   GLUCOSE 104 (H) 04/11/2021   TSH 0.319 (L) 05/28/2021     The 10-year ASCVD risk score (Arnett DK, et al., 2019) is: 43.5%  ---------------------------------------------------------------------------------------------------  Reports left arm/shoulder pain x 2 weeks. She is trouble lifting it up past her shoulder. Denies injury. She has been using heat and tiger balm.  Medications: Outpatient Medications Prior to Visit  Medication Sig   aspirin-acetaminophen-caffeine (EXCEDRIN MIGRAINE) 250-250-65 MG tablet Take 2 tablets by mouth 2 (two) times daily as needed for headache or migraine.   levothyroxine (SYNTHROID) 112  MCG tablet TAKE 1 TABLET BY MOUTH ONCE DAILY BEFORE BREAKFAST   ondansetron (ZOFRAN-ODT) 4 MG disintegrating tablet Take 1 tablet (4 mg total) by mouth every 8 (eight) hours as needed for nausea or vomiting.   pravastatin (PRAVACHOL) 40 MG tablet Take 1 tablet (40 mg total) by mouth daily.   [DISCONTINUED] hydrochlorothiazide (HYDRODIURIL) 25 MG tablet Take 1 tablet (25 mg total) by mouth daily.   [DISCONTINUED] metoprolol succinate (TOPROL-XL) 100 MG 24 hr tablet TAKE 1 TABLET BY MOUTH ONCE DAILY WITH  OR  IMMEDIATELY  FOLLOWING  A  MEAL   [DISCONTINUED] mirtazapine (REMERON) 30 MG tablet Take 1 tablet (30 mg total) by mouth at bedtime.   [DISCONTINUED] tamsulosin (FLOMAX) 0.4 MG CAPS capsule Take 1 capsule (0.4 mg total) by mouth daily. (Patient not taking: Reported on 04/01/2022)   No facility-administered medications prior to visit.    Review of Systems  Constitutional:  Negative for fatigue and fever.  Respiratory:  Negative for cough and shortness of breath.   Cardiovascular:  Negative for chest pain and leg swelling.  Gastrointestinal:  Negative for abdominal pain.  Musculoskeletal:  Positive for arthralgias.  Neurological:  Negative for dizziness and headaches.  Psychiatric/Behavioral:  Positive for sleep disturbance. The patient is nervous/anxious.        Objective    BP (!) 153/88 (BP Location: Right Arm, Patient Position: Sitting, Cuff Size: Normal)   Pulse 67   Ht '5\' 6"'$  (1.676 m)   Wt 174 lb (78.9 kg)  SpO2 97%   BMI 28.08 kg/m    Physical Exam Constitutional:      General: She is awake.     Appearance: She is well-developed.  HENT:     Head: Normocephalic.  Eyes:     Conjunctiva/sclera: Conjunctivae normal.  Cardiovascular:     Rate and Rhythm: Normal rate and regular rhythm.     Heart sounds: Normal heart sounds.  Pulmonary:     Effort: Pulmonary effort is normal.     Breath sounds: Normal breath sounds.  Skin:    General: Skin is warm.  Neurological:      Mental Status: She is alert and oriented to person, place, and time.  Psychiatric:        Attention and Perception: Attention normal.        Mood and Affect: Mood normal.        Speech: Speech normal.        Behavior: Behavior normal. Behavior is cooperative.      No results found for any visits on 04/01/22.  Assessment & Plan     Problem List Items Addressed This Visit       Cardiovascular and Mediastinum   Essential (primary) hypertension - Primary    Chronic, elevated today Previously controlled with hctz 25 mg and metoprolol 100 mg but pt has been without meds Refilled, encouraged her to check her bp at home      Relevant Medications   metoprolol succinate (TOPROL-XL) 100 MG 24 hr tablet   hydrochlorothiazide (HYDRODIURIL) 25 MG tablet   Other Relevant Orders   Comprehensive Metabolic Panel (CMET)   Lipid Profile   CBC w/Diff/Platelet   TSH + free T4     Endocrine   Hypothyroidism, postop    Will repeat tsh/t4 manages with levothyroxine 112 mcg      Relevant Medications   metoprolol succinate (TOPROL-XL) 100 MG 24 hr tablet   Other Relevant Orders   CBC w/Diff/Platelet   TSH + free T4     Other   Sleep disturbance    Well managed with mirtazapine      Relevant Medications   mirtazapine (REMERON) 45 MG tablet   HLD (hyperlipidemia)    Will repeat fasting lipids today Managed with pravastatin 40 mg The 10-year ASCVD risk score (Arnett DK, et al., 2019) is: 43.5%       Relevant Medications   metoprolol succinate (TOPROL-XL) 100 MG 24 hr tablet   hydrochlorothiazide (HYDRODIURIL) 25 MG tablet   Other Relevant Orders   Lipid Profile   CBC w/Diff/Platelet   Clinical depression    Pt admits to worsening symptoms May have cocurrent anxiety as well She prefers increasing mirtazapine dose before adding other meds Increase from 30 mg to 45 mg advised this is the max dose.  F/u 8 weeks      Relevant Medications   mirtazapine (REMERON) 45 MG tablet    High serum ferritin    Pt concerned and would like rechecked May have been 2/2 to iron replacement from [facility]?      Relevant Orders   Iron, TIBC and Ferritin Panel   Hyperglycemia    Historically will check a1c      Relevant Orders   HgB A1c   Acute pain of left shoulder    Advised heat, rx mobic daily prn  If persists can refer to PT      Relevant Medications   meloxicam (MOBIC) 15 MG tablet     Return in about  8 weeks (around 05/27/2022) for hypertension, anxiety, depression, insomnia.      I, [name], PA-C have reviewed all documentation for this visit. The documentation on  04/01/22  for the exam, diagnosis, procedures, and orders are all accurate and complete.  [name], PA-C [facility] [address] [place], [place], [address] Office: [phone] Fax: [place]

## 2022-04-01 NOTE — Assessment & Plan Note (Signed)
Pt concerned and would like rechecked May have been 2/2 to iron replacement from [facility]?

## 2022-04-01 NOTE — Assessment & Plan Note (Signed)
Historically will check a1c 

## 2022-04-02 ENCOUNTER — Telehealth: Payer: Self-pay

## 2022-04-02 ENCOUNTER — Other Ambulatory Visit: Payer: Self-pay | Admitting: Physician Assistant

## 2022-04-02 DIAGNOSIS — E890 Postprocedural hypothyroidism: Secondary | ICD-10-CM

## 2022-04-02 DIAGNOSIS — E785 Hyperlipidemia, unspecified: Secondary | ICD-10-CM

## 2022-04-02 DIAGNOSIS — D509 Iron deficiency anemia, unspecified: Secondary | ICD-10-CM

## 2022-04-02 DIAGNOSIS — Z1211 Encounter for screening for malignant neoplasm of colon: Secondary | ICD-10-CM

## 2022-04-02 LAB — CBC WITH DIFFERENTIAL/PLATELET
Basophils Absolute: 0 10*3/uL (ref 0.0–0.2)
Basos: 1 %
EOS (ABSOLUTE): 0 10*3/uL (ref 0.0–0.4)
Eos: 1 %
Hematocrit: 33.2 % — ABNORMAL LOW (ref 34.0–46.6)
Hemoglobin: 10.9 g/dL — ABNORMAL LOW (ref 11.1–15.9)
Immature Grans (Abs): 0 10*3/uL (ref 0.0–0.1)
Immature Granulocytes: 1 %
Lymphocytes Absolute: 1 10*3/uL (ref 0.7–3.1)
Lymphs: 24 %
MCH: 29.8 pg (ref 26.6–33.0)
MCHC: 32.8 g/dL (ref 31.5–35.7)
MCV: 91 fL (ref 79–97)
Monocytes Absolute: 0.3 10*3/uL (ref 0.1–0.9)
Monocytes: 7 %
Neutrophils Absolute: 2.8 10*3/uL (ref 1.4–7.0)
Neutrophils: 66 %
Platelets: 230 10*3/uL (ref 150–450)
RBC: 3.66 x10E6/uL — ABNORMAL LOW (ref 3.77–5.28)
RDW: 13 % (ref 11.7–15.4)
WBC: 4.2 10*3/uL (ref 3.4–10.8)

## 2022-04-02 LAB — IRON,TIBC AND FERRITIN PANEL
Ferritin: 92 ng/mL (ref 15–150)
Iron Saturation: 24 % (ref 15–55)
Iron: 70 ug/dL (ref 27–139)
Total Iron Binding Capacity: 290 ug/dL (ref 250–450)
UIBC: 220 ug/dL (ref 118–369)

## 2022-04-02 LAB — COMPREHENSIVE METABOLIC PANEL
ALT: 13 IU/L (ref 0–32)
AST: 21 IU/L (ref 0–40)
Albumin/Globulin Ratio: 1.3 (ref 1.2–2.2)
Albumin: 4.4 g/dL (ref 3.8–4.8)
Alkaline Phosphatase: 79 IU/L (ref 44–121)
BUN/Creatinine Ratio: 13 (ref 12–28)
BUN: 12 mg/dL (ref 8–27)
Bilirubin Total: 0.4 mg/dL (ref 0.0–1.2)
CO2: 26 mmol/L (ref 20–29)
Calcium: 9.6 mg/dL (ref 8.7–10.3)
Chloride: 105 mmol/L (ref 96–106)
Creatinine, Ser: 0.92 mg/dL (ref 0.57–1.00)
Globulin, Total: 3.5 g/dL (ref 1.5–4.5)
Glucose: 85 mg/dL (ref 70–99)
Potassium: 3.4 mmol/L — ABNORMAL LOW (ref 3.5–5.2)
Sodium: 143 mmol/L (ref 134–144)
Total Protein: 7.9 g/dL (ref 6.0–8.5)
eGFR: 65 mL/min/{1.73_m2} (ref >59)

## 2022-04-02 LAB — TSH+FREE T4
Free T4: 0.53 ng/dL — ABNORMAL LOW (ref 0.82–1.77)
TSH: 49.1 u[IU]/mL — ABNORMAL HIGH (ref 0.450–4.500)

## 2022-04-02 LAB — LIPID PANEL
Chol/HDL Ratio: 3.3 ratio (ref 0.0–4.4)
Cholesterol, Total: 240 mg/dL — ABNORMAL HIGH (ref 100–199)
HDL: 72 mg/dL (ref >39)
LDL Chol Calc (NIH): 149 mg/dL — ABNORMAL HIGH (ref 0–99)
Triglycerides: 110 mg/dL (ref 0–149)
VLDL Cholesterol Cal: 19 mg/dL (ref 5–40)

## 2022-04-02 LAB — HEMOGLOBIN A1C
Est. average glucose Bld gHb Est-mCnc: 114 mg/dL
Hgb A1c MFr Bld: 5.6 % (ref 4.8–5.6)

## 2022-04-02 MED ORDER — ROSUVASTATIN CALCIUM 20 MG PO TABS: 20.0000 mg | ORAL_TABLET | Freq: Every day | ORAL | 3 refills | Status: DC

## 2022-04-02 NOTE — Telephone Encounter (Signed)
Copied from [place] [phone]. Topic: Referral - Question >> Apr 02, 2022 11:42 AM Erskine Squibb wrote: Reason for CRM: Woodward Ku from Dr Benjamine Mola office called in stating she needs more information including demographics and insurance information and any notes pertaining to her thryoidectomy surgery. She needs to know exactly why she is being seen. Send info to fax #  [phone]

## 2022-04-03 ENCOUNTER — Ambulatory Visit (INDEPENDENT_AMBULATORY_CARE_PROVIDER_SITE_OTHER): Payer: Medicare Other | Admitting: Urology

## 2022-04-03 ENCOUNTER — Ambulatory Visit: Payer: Self-pay | Admitting: *Deleted

## 2022-04-03 ENCOUNTER — Telehealth: Payer: Self-pay | Admitting: *Deleted

## 2022-04-03 ENCOUNTER — Encounter: Payer: Self-pay | Admitting: Urology

## 2022-04-03 VITALS — BP 138/86 | HR 63 | Ht 67.0 in | Wt 175.0 lb

## 2022-04-03 DIAGNOSIS — N200 Calculus of kidney: Secondary | ICD-10-CM | POA: Diagnosis not present

## 2022-04-03 NOTE — Progress Notes (Signed)
PEC gave pt results.  KP

## 2022-04-03 NOTE — Telephone Encounter (Signed)
Patient notified of provider response- OTC iron supplement is fine- normally '325mg'$  is dosed. Patient was given name/number of endocrinology referral.

## 2022-04-03 NOTE — Telephone Encounter (Signed)
Pt called back for results. Result note reviewed with pt, verbalizes understanding.    "Her cholesterol is elevated even with the cholesterol medication-- I would like to change that statin to a better one-- crestor 20 mg. Prescription sent. Stop the pravastatin and start the crestor (rosuvastatin)   She is anemic and has been before -- previously I believe she was treated with iron, is she still taking a supplement? Please remind her she is also due for a colonoscopy. I have placed the order.   Her thyroid levels are very different that a few months ago. Given her history of thyroidectomy and nodules I would like to refer her back to endocrinology for management. Referral placed."  Pt states she took OTC Fe ('325mg'$   or '67mg'$ , unsure) but has not taken any in "Many months."

## 2022-04-03 NOTE — Telephone Encounter (Signed)
Called pt left VM she can take an iron tablet daily. Pts name was stated on VM.  KP

## 2022-04-03 NOTE — Progress Notes (Signed)
04/03/2022 8:24 AM   Melissa Fitzgerald 04-15-1945 [id number]  Referring provider: [name], PA-C [address] [place],  [place] [address]  Chief Complaint  Patient presents with   Nephrolithiasis   Urologic history: 1.  Recurrent nephrolithiasis Ureteroscopic removal 10 mm right mid ureteral calculus 06/2017 Ureteroscopic removal right renal calculi 04/2021  HPI: 77 y.o. female presents for follow-up.  PA visit 08/30/2021 for left flank pain and gross hematuria.  Stone protocol CT showed nonobstructing left renal calculi.  Small right renal calculi present most likely intraparenchymal Pain and hematuria resolved.  Urine culture grew lactobacillus She has mild intermittent right flank pain which she thinks is most likely secondary to arthritis Denies freq current gross hematuria KUB performed earlier this week was reviewed and there are calcifications overlying the left renal outline   PMH: Past Medical History:  Diagnosis Date   Anemia    Arthritis    Cancer ([place])    Colon polyp    Depression    Headache    History of 2019 novel coronavirus disease (COVID-19) 04/09/2021   History of kidney stones    Hyperlipidemia    Hypertension    Hypothyroidism    Irritable bowel disease    Kidney stone    Pneumonia    Thyroid disease     Surgical History: Past Surgical History:  Procedure Laterality Date   ABDOMINAL HYSTERECTOMY     BREAST ENHANCEMENT SURGERY     BREAST SURGERY     CHOLECYSTECTOMY  2000   COLECTOMY  09/2013   COLONOSCOPY  09-13-13   Dr [name]   COLONOSCOPY WITH PROPOFOL N/A 10/18/2014   Procedure: COLONOSCOPY WITH PROPOFOL;  Surgeon: [name], MD;  Location: [facility];  Service: Endoscopy;  Laterality: N/A;   CYSTOSCOPY WITH URETEROSCOPY AND STENT PLACEMENT Right 04/09/2021   Procedure: CYSTOSCOPY WITH URETEROSCOPY AND STENT PLACEMENT;  Surgeon: Abbie Sons, MD;  Location: [facility];  Service: Urology;  Laterality: Right;    CYSTOSCOPY/URETEROSCOPY/HOLMIUM LASER/STENT PLACEMENT Right 06/23/2017   Procedure: CYSTOSCOPY/URETEROSCOPY/HOLMIUM LASER/STENT PLACEMENT;  Surgeon: Abbie Sons, MD;  Location: [facility];  Service: Urology;  Laterality: Right;   CYSTOSCOPY/URETEROSCOPY/HOLMIUM LASER/STENT PLACEMENT Right 05/07/2021   Procedure: CYSTOSCOPY/URETEROSCOPY/HOLMIUM LASER/STENT PLACEMENT;  Surgeon: Abbie Sons, MD;  Location: [facility];  Service: Urology;  Laterality: Right;   ESOPHAGOGASTRODUODENOSCOPY (EGD) WITH PROPOFOL N/A 01/03/2015   Procedure: ESOPHAGOGASTRODUODENOSCOPY (EGD) WITH PROPOFOL;  Surgeon: [name], MD;  Location: [facility];  Service: Endoscopy;  Laterality: N/A;   EYE SURGERY     bilateral cataract    FOOT SURGERY     KIDNEY STONE SURGERY     NASAL SINUS SURGERY     PORT-A-CATH REMOVAL     PORTACATH PLACEMENT  10/24/13   THYROID SURGERY     TONSILLECTOMY      Home Medications:  Allergies as of 04/03/2022       Reactions   Codeine Nausea And Vomiting   Oxycodone Nausea And Vomiting   Penicillins Nausea And Vomiting   Has patient had a PCN reaction causing immediate rash, facial/tongue/throat swelling, SOB or lightheadedness with hypotension: No Has patient had a PCN reaction causing severe rash involving mucus membranes or skin necrosis: No Has patient had a PCN reaction that required hospitalization: No Has patient had a PCN reaction occurring within the last 10 years: No If all of the above answers are "NO", then may proceed with Cephalosporin use.   Tramadol Nausea And Vomiting   Trintellix [vortioxetine]  Itching        Medication List        Accurate as of April 03, 2022  8:24 AM. If you have any questions, ask your nurse or doctor.          aspirin-acetaminophen-caffeine 250-250-65 MG tablet Commonly known as: EXCEDRIN MIGRAINE Take 2 tablets by mouth 2 (two) times daily as needed for headache or migraine.   hydrochlorothiazide 25 MG  tablet Commonly known as: HYDRODIURIL Take 1 tablet (25 mg total) by mouth daily.   levothyroxine 112 MCG tablet Commonly known as: SYNTHROID TAKE 1 TABLET BY MOUTH ONCE DAILY BEFORE BREAKFAST   meloxicam 15 MG tablet Commonly known as: MOBIC Take 1 tablet (15 mg total) by mouth daily.   metoprolol succinate 100 MG 24 hr tablet Commonly known as: TOPROL-XL TAKE 1 TABLET BY MOUTH ONCE DAILY WITH  OR  IMMEDIATELY  FOLLOWING  A  MEAL   mirtazapine 45 MG tablet Commonly known as: REMERON Take 1 tablet (45 mg total) by mouth at bedtime.   ondansetron 4 MG disintegrating tablet Commonly known as: ZOFRAN-ODT Take 1 tablet (4 mg total) by mouth every 8 (eight) hours as needed for nausea or vomiting.   rosuvastatin 20 MG tablet Commonly known as: Crestor Take 1 tablet (20 mg total) by mouth daily.        Allergies:  Allergies  Allergen Reactions   Codeine Nausea And Vomiting   Oxycodone Nausea And Vomiting   Penicillins Nausea And Vomiting    Has patient had a PCN reaction causing immediate rash, facial/tongue/throat swelling, SOB or lightheadedness with hypotension: No Has patient had a PCN reaction causing severe rash involving mucus membranes or skin necrosis: No Has patient had a PCN reaction that required hospitalization: No Has patient had a PCN reaction occurring within the last 10 years: No If all of the above answers are "NO", then may proceed with Cephalosporin use.    Tramadol Nausea And Vomiting   Trintellix [Vortioxetine] Itching    Family History: Family History  Problem Relation Age of Onset   Heart disease Mother    Diabetes Mother    Arthritis Sister    Hyperlipidemia Sister    COPD Brother    Diabetes Sister    Colon cancer Other        first cousin   Breast cancer Maternal Aunt    Breast cancer Paternal Aunt     Social History:  reports that she has been smoking cigarettes. She has been exposed to tobacco smoke. She has never used smokeless  tobacco. She reports current alcohol use. She reports that she does not use drugs.   Physical Exam: BP 138/86   Pulse 63   Ht '5\' 7"'$  (1.702 m)   Wt 175 lb (79.4 kg)   BMI 27.41 kg/m   Constitutional:  Alert and oriented, No acute distress. HEENT: [place] AT, moist mucus membranes.  Trachea midline, no masses. Cardiovascular: No clubbing, cyanosis, or edema. Respiratory: Normal respiratory effort, no increased work of breathing.  Pertinent imaging: KUB images were personally reviewed and interpreted  Assessment & Plan:    1.  Recurrent nephrolithiasis Stable left renal calculi.  Right renal calculi most likely intraparenchymal Follow-up 1 year with [place], MD  [facility] [address] [place], [place] [address] [phone]

## 2022-04-03 NOTE — Telephone Encounter (Signed)
Summary: out of med can pt take old medication   PT had talked to [facility] earlier about her medications and now is wanting to ask if it is just ok if she takes some old medical she already has, will it harm her? Has found what she needs but old? [phone]        Called patient on [phone] to review medication questions. No answer, message in Spanish and did not allow NT to leave message to call back.

## 2022-04-03 NOTE — Telephone Encounter (Signed)
Pt called but able to LVM. Autumn Patty, CMA has already spoken with pt, please refer to Telephone Enounter from today for review.

## 2022-04-16 ENCOUNTER — Telehealth: Payer: Self-pay

## 2022-04-24 NOTE — Telephone Encounter (Signed)
Called pt left VM to call back.  KP 

## 2022-04-24 NOTE — Telephone Encounter (Signed)
Patient called, left VM on home phone to return the call to the office to schedule a f/u visit.

## 2022-05-14 ENCOUNTER — Encounter: Payer: Self-pay | Admitting: Hematology and Oncology

## 2022-05-23 ENCOUNTER — Other Ambulatory Visit: Payer: Self-pay

## 2022-05-23 ENCOUNTER — Encounter: Payer: Self-pay | Admitting: Hematology and Oncology

## 2022-05-23 ENCOUNTER — Emergency Department
Admission: EM | Admit: 2022-05-23 | Discharge: 2022-05-23 | Disposition: A | Discharge status: Home / Self Care | Payer: 59 | Attending: Student in an Organized Health Care Education/Training Program | Admitting: Student in an Organized Health Care Education/Training Program

## 2022-05-23 ENCOUNTER — Emergency Department: Payer: 59

## 2022-05-23 DIAGNOSIS — R109 Unspecified abdominal pain: Secondary | ICD-10-CM

## 2022-05-23 LAB — BASIC METABOLIC PANEL
Anion gap: 9 (ref 5–15)
BUN: 21 mg/dL (ref 8–23)
CO2: 31 mmol/L (ref 22–32)
Calcium: 9.4 mg/dL (ref 8.9–10.3)
Chloride: 103 mmol/L (ref 98–111)
Creatinine, Ser: 0.95 mg/dL (ref 0.44–1.00)
GFR, Estimated: >60 mL/min (ref >60)
Glucose, Bld: 94 mg/dL (ref 70–99)
Potassium: 3.3 mmol/L — ABNORMAL LOW (ref 3.5–5.1)
Sodium: 143 mmol/L (ref 135–145)

## 2022-05-23 LAB — URINALYSIS, ROUTINE W REFLEX MICROSCOPIC
Bilirubin Urine: NEGATIVE
Glucose, UA: NEGATIVE mg/dL
Hgb urine dipstick: NEGATIVE
Ketones, ur: NEGATIVE mg/dL
Nitrite: NEGATIVE
Protein, ur: NEGATIVE mg/dL
Specific Gravity, Urine: 1.018 (ref 1.005–1.030)
pH: 5 (ref 5.0–8.0)

## 2022-05-23 LAB — CBC
HCT: 35.1 % — ABNORMAL LOW (ref 36.0–46.0)
Hemoglobin: 10.8 g/dL — ABNORMAL LOW (ref 12.0–15.0)
MCH: 29.4 pg (ref 26.0–34.0)
MCHC: 30.8 g/dL (ref 30.0–36.0)
MCV: 95.6 fL (ref 80.0–100.0)
Platelets: 205 10*3/uL (ref 150–400)
RBC: 3.67 MIL/uL — ABNORMAL LOW (ref 3.87–5.11)
RDW: 12.7 % (ref 11.5–15.5)
WBC: 4.8 10*3/uL (ref 4.0–10.5)
nRBC: 0 % (ref 0.0–0.2)

## 2022-05-23 MED ORDER — OXYCODONE-ACETAMINOPHEN 5-325 MG PO TABS: 1.0000 | ORAL_TABLET | Freq: Four times a day (QID) | ORAL | 0 refills | Status: DC | PRN

## 2022-05-23 MED ORDER — KETOROLAC TROMETHAMINE 30 MG/ML IJ SOLN
30.0000 mg | Freq: Once | INTRAMUSCULAR | Status: AC
  Administered 2022-05-23: 30 mg via INTRAMUSCULAR
  Filled 2022-05-23: qty 1

## 2022-05-23 MED ORDER — ONDANSETRON 4 MG PO TBDP: 4.0000 mg | ORAL_TABLET | Freq: Three times a day (TID) | ORAL | 0 refills | Status: AC | PRN

## 2022-05-23 MED ORDER — ONDANSETRON 4 MG PO TBDP
4.0000 mg | ORAL_TABLET | Freq: Once | ORAL | Status: AC
  Administered 2022-05-23: 4 mg via ORAL
  Filled 2022-05-23: qty 1

## 2022-05-23 MED ORDER — OXYCODONE-ACETAMINOPHEN 5-325 MG PO TABS
1.0000 | ORAL_TABLET | Freq: Once | ORAL | Status: AC
  Administered 2022-05-23: 1 via ORAL
  Filled 2022-05-23: qty 1

## 2022-05-23 NOTE — ED Triage Notes (Signed)
Pt here with right side flank pain. Pt states she gets them often and was being treated by her primary was told they were hereditary. Pt ambulatory to triage.

## 2022-05-23 NOTE — ED Provider Notes (Signed)
[facility] Provider Note    Event Date/Time   First MD Initiated Contact with Patient 05/23/22 1503     (approximate)   History   Flank Pain   HPI  Melissa Fitzgerald is a 77 y.o. female with a history of recurrent nephrolithiasis followed in urology clinic presents to the ER for recurrent left flank pain consistent with her kidney stones.  No measured chills or fevers.  Pain she states is identical to previous kidney stones.  No history of aneurysm.  No dysuria no abnormal bowel movements.  No shortness of breath or chest pain.  Denies any hematuria.     Physical Exam   Triage Vital Signs: ED Triage Vitals  Enc Vitals Group     BP 05/23/22 1350 (!) 147/99     Pulse Rate 05/23/22 1350 76     Resp 05/23/22 1350 18     Temp 05/23/22 1350 98.1 F (36.7 C)     Temp src --      SpO2 05/23/22 1350 97 %     Weight 05/23/22 1351 175 lb 0.7 oz (79.4 kg)     Height 05/23/22 1351 '5\' 7"'$  (1.702 m)     Head Circumference --      Peak Flow --      Pain Score 05/23/22 1350 8     Pain Loc --      Pain Edu? --      Excl. in [place]? --     Most recent vital signs: Vitals:   05/23/22 1350  BP: (!) 147/99  Pulse: 76  Resp: 18  Temp: 98.1 F (36.7 C)  SpO2: 97%     Constitutional: Alert  Eyes: Conjunctivae are normal.  Head: Atraumatic. Nose: No congestion/rhinnorhea. Mouth/Throat: Mucous membranes are moist.   Neck: Painless ROM.  Cardiovascular:   Good peripheral circulation. Respiratory: Normal respiratory effort.  No retractions.  Gastrointestinal: Soft and nontender.  Musculoskeletal:  no deformity Neurologic:  MAE spontaneously. No gross focal neurologic deficits are appreciated.  Skin:  Skin is warm, dry and intact. No rash noted. Psychiatric: Mood and affect are normal. Speech and behavior are normal.    ED Results / Procedures / Treatments   Labs (all labs ordered are listed, but only abnormal results are displayed) Labs Reviewed   URINALYSIS, ROUTINE W REFLEX MICROSCOPIC - Abnormal; Notable for the following components:      Result Value   Color, Urine YELLOW (*)    APPearance HAZY (*)    Leukocytes,Ua TRACE (*)    Bacteria, UA RARE (*)    All other components within normal limits  BASIC METABOLIC PANEL - Abnormal; Notable for the following components:   Potassium 3.3 (*)    All other components within normal limits  CBC - Abnormal; Notable for the following components:   RBC 3.67 (*)    Hemoglobin 10.8 (*)    HCT 35.1 (*)    All other components within normal limits     EKG     RADIOLOGY Please see ED Course for my review and interpretation.  I personally reviewed all radiographic images ordered to evaluate for the above acute complaints and reviewed radiology reports and findings.  These findings were personally discussed with the patient.  Please see medical record for radiology report.    PROCEDURES:  Critical Care performed: No  Procedures   MEDICATIONS ORDERED IN ED: Medications  ketorolac (TORADOL) 30 MG/ML injection 30 mg (30 mg Intramuscular Given 05/23/22  1516)  oxyCODONE-acetaminophen (PERCOCET/ROXICET) 5-325 MG per tablet 1 tablet (1 tablet Oral Given 05/23/22 1515)  ondansetron (ZOFRAN-ODT) disintegrating tablet 4 mg (4 mg Oral Given 05/23/22 1515)     IMPRESSION / MDM / ASSESSMENT AND PLAN / ED COURSE  I reviewed the triage vital signs and the nursing notes.                              Differential diagnosis includes, but is not limited to, stone, pyelo-, AAA, colitis, musculoskeletal strain  Patient presenting to the ER for evaluation of symptoms as described above.  Based on symptoms, risk factors and considered above differential, this presenting complaint could reflect a potentially life-threatening illness therefore the patient will be placed on continuous pulse oximetry and telemetry for monitoring.  Laboratory evaluation will be sent to evaluate for the above complaints.   Patient with well-documented history of kidney stones presenting with symptoms consistent with her kidney stones.  Had recent CT imaging with no sign of AAA.  Her abdominal exam is soft benign no leukocytosis no fever urinalysis without any evidence of infection will order KUB to evaluate for stone burden will treat symptomatically.  Patient agreeable plan.   Clinical Course as of 05/23/22 1629  Fri May 23, 2022  1607 Patient reassessed.  Pain is controlled.  Given her extensive history of ureterolithiasis with recent imaging and no symptoms or history out of the ordinary for her I think that conservative management with close outpatient follow-up she is already established with urology is reasonable.  We discussed return precautions.  Pt agreeable with plan. [PR]    Clinical Course User Index [PR] Merlyn Lot, MD    FINAL CLINICAL IMPRESSION(S) / ED DIAGNOSES   Final diagnoses:  Left flank pain     Rx / DC Orders   ED Discharge Orders          Ordered    oxyCODONE-acetaminophen (PERCOCET) 5-325 MG tablet  Every 6 hours PRN        05/23/22 1610    ondansetron (ZOFRAN-ODT) 4 MG disintegrating tablet  Every 8 hours PRN        05/23/22 1610             Note:  This document was prepared using Dragon voice recognition software and may include unintentional dictation errors.    Merlyn Lot, MD 05/23/22 [phone]

## 2022-05-29 ENCOUNTER — Telehealth: Payer: Self-pay | Admitting: *Deleted

## 2022-05-29 ENCOUNTER — Ambulatory Visit: Payer: Self-pay | Admitting: *Deleted

## 2022-05-29 ENCOUNTER — Ambulatory Visit (INDEPENDENT_AMBULATORY_CARE_PROVIDER_SITE_OTHER): Payer: 59 | Admitting: Physician Assistant

## 2022-05-29 ENCOUNTER — Encounter: Payer: Self-pay | Admitting: Physician Assistant

## 2022-05-29 VITALS — BP 121/73 | HR 58 | Wt 169.2 lb

## 2022-05-29 DIAGNOSIS — R109 Unspecified abdominal pain: Secondary | ICD-10-CM | POA: Diagnosis not present

## 2022-05-29 DIAGNOSIS — I10 Essential (primary) hypertension: Secondary | ICD-10-CM | POA: Diagnosis not present

## 2022-05-29 DIAGNOSIS — M545 Low back pain, unspecified: Secondary | ICD-10-CM | POA: Insufficient documentation

## 2022-05-29 DIAGNOSIS — F3341 Major depressive disorder, recurrent, in partial remission: Secondary | ICD-10-CM | POA: Diagnosis not present

## 2022-05-29 MED ORDER — TRAMADOL HCL 50 MG PO TABS: 50.0000 mg | ORAL_TABLET | Freq: Three times a day (TID) | ORAL | 0 refills | Status: AC | PRN

## 2022-05-29 NOTE — Assessment & Plan Note (Signed)
Pt reports symptoms have improved since taking increased dose of mirtazapine.

## 2022-05-29 NOTE — Telephone Encounter (Signed)
[place]       Patient  visited [facility] on 05/23/2022  for treatment   Telephone encounter attempt :  1st  No answer   [place] [phone] [address] , [place] [address] Email : [email or web address]

## 2022-05-29 NOTE — Assessment & Plan Note (Signed)
History of nephrolithiasis  based on KUB in ED compared to most recent 1/24 from uro; they appear stable. Unsure if it is a documentation error or patient confusion that she is feeling right flank pain and the ED documented left. Not convinced her symptoms are from nephrolithiasis given they are stable in nature on imaging and her pain is not constant. More sus over a msk issue Advised pt I will reach out to urology for further guidance regarding imaging;  For now, UA in ED + leuk but no culture run, I will order culture today  Rx tramadol x 5 days ; hopefully will help both possible nephrolithiasis related pain and musculoskeletal pain

## 2022-05-29 NOTE — Telephone Encounter (Signed)
  Chief Complaint: daughter is calling with concerns about mother: depression Symptoms: lack of concern about her health, sleeping a lot- not sleeping well, pain Frequency: chronic- but seems worse  Disposition: '[]'$ ED /'[]'$ Urgent Care (no appt availability in office) / '[x]'$ Appointment(In office/virtual)/ '[]'$  [place] [facility]/ '[]'$ Home Care/ '[]'$ Refused Recommended Disposition /'[]'$ [place] [place] [facility]/ '[]'$  Follow-up with PCP Additional Notes: Patient's daughter, Lisa(DPR) is calling she is concerned her mother's depression is getting worse. She would like PCP to discuss this with patient- she has scheduled a hospital follow up for patient to make sure she is addressing her pain.

## 2022-05-29 NOTE — Telephone Encounter (Signed)
   Telephone encounter was:  Successful.  05/29/2022 Name: Melissa Fitzgerald MRN: [id number] DOB: 1945-10-13  [name] is a 77 y.o. year old female who is a primary care patient of System, Provider Not In . The community resource team was consulted for assistance with Transportation Needs   Care guide performed the following interventions: Patient provided with information about care guide support team and interviewed to confirm resource needs.  Follow Up Plan:  No further follow up planned at this time. The patient has been provided with needed resources.  [place] [phone] [address] , [place] [address] Email : Ashby [email or web address]. [email or web address]

## 2022-05-29 NOTE — Telephone Encounter (Signed)
Reason for Disposition  [1] Depression AND [2] worsening (e.g., sleeping poorly, less able to do activities of daily living)  Answer Assessment - Initial Assessment Questions 1. CONCERN: "What happened that made you call today?"     Concern about focus on her health- decreased concern, no screening for colon cancer follow up 2. DEPRESSION SYMPTOM SCREENING: "How are you feeling overall?" (e.g., decreased energy, increased sleeping or difficulty sleeping, difficulty concentrating, feelings of sadness, guilt, hopelessness, or worthlessness)     Combative over health concerns- feel she is shutting daughter out 3. RISK OF HARM - SUICIDAL IDEATION:  "Do you ever have thoughts of hurting or killing yourself?"  (e.g., yes, no, no but preoccupation with thoughts about death)   - INTENT:  "Do you have thoughts of hurting or killing yourself right NOW?" (e.g., yes, no, N/A)   - PLAN: "Do you have a specific plan for how you would do this?" (e.g., gun, knife, overdose, no plan, N/A)     no 4. RISK OF HARM - HOMICIDAL IDEATION:  "Do you ever have thoughts of hurting or killing someone else?"  (e.g., yes, no, no but preoccupation with thoughts about death)   - INTENT:  "Do you have thoughts of hurting or killing someone right NOW?" (e.g., yes, no, N/A)   - PLAN: "Do you have a specific plan for how you would do this?" (e.g., gun, knife, no plan, N/A)      no 5. FUNCTIONAL IMPAIRMENT: "How have things been going for you overall? Have you had more difficulty than usual doing your normal daily activities?"  (e.g., better, same, worse; self-care, school, work, interactions)     Sleeping a lot, pain 6. SUPPORT: "Who is with you now?" "Who do you live with?" "Do you have family or friends who you can talk to?"      daughter is trying to be supportive- other does not allow daughter to come to appointment 7. THERAPIST: "Do you have a counselor or therapist? Name?"     no 8. STRESSORS: "Has there been any new stress  or recent changes in your life?"     None known- patient has had hx of depression years ago 81. ALCOHOL USE OR SUBSTANCE USE (DRUG USE): "Do you drink alcohol or use any illegal drugs?"     no 10. OTHER: "Do you have any other physical symptoms right now?" (e.g., fever)       Chronic headache in past, not sleeping well  Protocols used: Depression-A-AH

## 2022-05-29 NOTE — Progress Notes (Signed)
I,Sha'taria Tyson,acting as a Education administrator for [employer], PA-C.,have documented all relevant documentation on the behalf of Melissa Kirschner, PA-C,as directed by  Melissa Kirschner, PA-C while in the presence of Melissa Kirschner, PA-C.   Established patient visit   Patient: Melissa Fitzgerald   DOB: 10-20-1945   77 y.o. Female  MRN: [id number] Visit Date: 05/29/2022  Today's healthcare provider: [name], PA-C   Cc. HTN, anxiety/depression f/u, ED f/u  Subjective    HPI  Pt reports increased right flank pain x 1-2 weeks. She was in the ED 05/23/22 for left flank pain (as seen in note) but states the pain she is having now is the same as it was then. Denies urinary symptoms -- dysuria, frequency, hematuria. She reports her flank pain feels the same as her usual kidney stone pain - but reports no pain at rest. Reports her pain flares when she moves, bends, walks. Sometimes radiates down her right leg.   Hypertension, follow-up  BP Readings from Last 3 Encounters:  05/29/22 121/73  05/23/22 (!) 147/99  04/03/22 138/86   Wt Readings from Last 3 Encounters:  05/29/22 169 lb 3.2 oz (76.7 kg)  05/23/22 175 lb 0.7 oz (79.4 kg)  04/03/22 175 lb (79.4 kg)     She was last seen for hypertension 8 weeks ago.  BP at that visit was 153/88. Management since that visit includes refilled hctz 25 mg and metoprolol 100 mg .  Outside blood pressures are not being checked Symptoms: No chest pain No chest pressure  No palpitations No syncope  No dyspnea No orthopnea  No paroxysmal nocturnal dyspnea No lower extremity edema   Anxiety, Follow-up  She was last seen for anxiety 8 weeks ago. Changes made at last visit include prefers increasing mirtazapine dose before adding other meds Increase from 30 mg to 45 mg advised this is the max dose.   She reports excellent compliance with treatment. She reports excellent tolerance of treatment. She is not having side effects.   She feels her anxiety is  mild and Unchanged since last visit.  Symptoms: No chest pain Yes difficulty concentrating  No dizziness Yes fatigue  No feelings of losing control Yes insomnia  No irritable No palpitations  No panic attacks Yes racing thoughts  No shortness of breath No sweating  No tremors/shakes    GAD-7 Results    05/29/2022    2:56 PM  GAD-7 Generalized Anxiety Disorder Screening Tool  1. Feeling Nervous, Anxious, or on Edge 3  2. Not Being Able to Stop or Control Worrying 3  3. Worrying Too Much About Different Things 3  4. Trouble Relaxing 1  5. Being So Restless it's Hard To Sit Still 0  6. Becoming Easily Annoyed or Irritable 2  7. Feeling Afraid As If Something Awful Might Happen 1  Total GAD-7 Score 13  Difficulty At Work, Home, or Getting  Along With Others? Not difficult at all   Depression, Follow-up Current symptoms include: difficulty concentrating and fatigue She feels she is Unchanged since last visit.     05/29/2022    2:56 PM 07/10/2021    9:06 AM 05/28/2021    2:42 PM  Depression screen PHQ 2/9  Decreased Interest 1 0 1  Down, Depressed, Hopeless 1 0 1  PHQ - 2 Score 2 0 2  Altered sleeping '1 1 2  '$ Tired, decreased energy '1 1 1  '$ Change in appetite 0 0 1  Feeling bad or failure  about yourself  0 0 0  Trouble concentrating '1 1 1  '$ Moving slowly or fidgety/restless 1 0 1  Suicidal thoughts 0 0 0  PHQ-9 Score '6 3 8  '$ Difficult doing work/chores  Not difficult at all Not difficult at all      -----------------------------------------------------------------------------------------   Medications: Outpatient Medications Prior to Visit  Medication Sig   aspirin-acetaminophen-caffeine (EXCEDRIN MIGRAINE) 250-250-65 MG tablet Take 2 tablets by mouth 2 (two) times daily as needed for headache or migraine.   hydrochlorothiazide (HYDRODIURIL) 25 MG tablet Take 1 tablet (25 mg total) by mouth daily.   levothyroxine (SYNTHROID) 112 MCG tablet TAKE 1 TABLET BY MOUTH ONCE DAILY  BEFORE BREAKFAST   meloxicam (MOBIC) 15 MG tablet Take 1 tablet (15 mg total) by mouth daily.   metoprolol succinate (TOPROL-XL) 100 MG 24 hr tablet TAKE 1 TABLET BY MOUTH ONCE DAILY WITH  OR  IMMEDIATELY  FOLLOWING  A  MEAL   mirtazapine (REMERON) 45 MG tablet Take 1 tablet (45 mg total) by mouth at bedtime.   ondansetron (ZOFRAN-ODT) 4 MG disintegrating tablet Take 1 tablet (4 mg total) by mouth every 8 (eight) hours as needed for nausea or vomiting.   rosuvastatin (CRESTOR) 20 MG tablet Take 1 tablet (20 mg total) by mouth daily.   [DISCONTINUED] oxyCODONE-acetaminophen (PERCOCET) 5-325 MG tablet Take 1 tablet by mouth every 6 (six) hours as needed for severe pain.   No facility-administered medications prior to visit.    Review of Systems  Constitutional:  Negative for fatigue and fever.  Respiratory:  Negative for cough and shortness of breath.   Cardiovascular:  Negative for chest pain and leg swelling.  Gastrointestinal:  Negative for abdominal pain.  Musculoskeletal:  Positive for arthralgias and back pain.  Neurological:  Negative for dizziness and headaches.      Objective    BP 121/73 (BP Location: Left Arm, Patient Position: Sitting, Cuff Size: Normal)   Pulse (!) 58   Wt 169 lb 3.2 oz (76.7 kg)   SpO2 100%   BMI 26.50 kg/m    Physical Exam Constitutional:      General: She is awake.     Appearance: She is well-developed.  HENT:     Head: Normocephalic.  Eyes:     Conjunctiva/sclera: Conjunctivae normal.  Cardiovascular:     Rate and Rhythm: Normal rate and regular rhythm.     Heart sounds: Normal heart sounds.  Pulmonary:     Effort: Pulmonary effort is normal.     Breath sounds: Normal breath sounds.  Abdominal:     Tenderness: There is no abdominal tenderness. There is right CVA tenderness. There is no left CVA tenderness.  Skin:    General: Skin is warm.  Neurological:     Mental Status: She is alert and oriented to person, place, and time.   Psychiatric:        Attention and Perception: Attention normal.        Mood and Affect: Mood normal.        Speech: Speech normal.        Behavior: Behavior is cooperative.      No results found for any visits on 05/29/22.  Assessment & Plan     Problem List Items Addressed This Visit       Cardiovascular and Mediastinum   Essential (primary) hypertension    In normal range today Continue current medications Reviewed cmp        Other   Clinical depression  Pt reports symptoms have improved since taking increased dose of mirtazapine.       Acute right-sided low back pain without sciatica - Primary    History of nephrolithiasis  based on KUB in ED compared to most recent 1/24 from uro; they appear stable. Unsure if it is a documentation error or patient confusion that she is feeling right flank pain and the ED documented left. Not convinced her symptoms are from nephrolithiasis given they are stable in nature on imaging and her pain is not constant. More sus over a msk issue Advised pt I will reach out to urology for further guidance regarding imaging;  For now, UA in ED + leuk but no culture run, I will order culture today  Rx tramadol x 5 days ; hopefully will help both possible nephrolithiasis related pain and musculoskeletal pain      Relevant Medications   traMADol (ULTRAM) 50 MG tablet   Other Relevant Orders   Urine Culture    Return if symptoms worsen or fail to improve.      I, Melissa Kirschner, PA-C have reviewed all documentation for this visit. The documentation on  05/29/22  for the exam, diagnosis, procedures, and orders are all accurate and complete.  Melissa Kirschner, PA-C [facility] [address] [place], [place], [address] Office: [phone] Fax: [place]

## 2022-05-29 NOTE — Assessment & Plan Note (Signed)
In normal range today Continue current medications Reviewed cmp

## 2022-05-31 LAB — URINE CULTURE

## 2022-06-03 ENCOUNTER — Telehealth: Payer: Self-pay

## 2022-06-03 NOTE — Telephone Encounter (Signed)
Pt given lab results per notes of Ria Comment, [place] on 06/03/22. Pt verbalized understanding and states her pain is getting a little bit better and will let PCP know if things dont improve. No further assistance needed.

## 2022-06-16 NOTE — Progress Notes (Unsigned)
06/17/2022 2:32 PM   Melissa Fitzgerald 1946/01/07 [id number]  Referring provider: [name], PA-C [address] [place],  [place] [address]  Urological history: 1.  Nephrolithiasis -Stone composition unknown - right URS (2019) - right URS (2023) -CT renal stone study (09/2021) bilateral renal calculi -KUB (05/2022) stable left nephrolithiasis  Chief Complaint  Patient presents with   Nephrolithiasis    HPI: Melissa Fitzgerald is a 77 y.o. female who presents today for ED follow up with her friend, Solon Palm.   She presented to the ED on May 23, 2022 for left-sided flank pain.  KUB at that time demonstrated a stable left renal calculi.  Her urinalysis was yellow hazy, specific gravity 1.018, pH 5.0, trace leukocyte, 0-5 RBCs, 0-5 WBCs, rare bacteria, 0-5 squamous epithelial cells, mucus present and hyaline cast present.  Serum creatinine 0.95.  WBC count of 4.8.  Urine culture mixed urogenital flora.  She was given an injection of Toradol, given a tablet of Percocet and Zofran.  She was discharged on Percocet and Zofran.  Today, she is experiencing pain in her right flank area.  She states it has been occurring off and on for the last month.  The pain does not radiate.  Patient denies any modifying or aggravating factors.  Patient denies any gross hematuria, dysuria or suprapubic/flank pain.  Patient denies any fevers, chills, nausea or vomiting.    UA yellow slightly cloudy, specific gravity greater than 1.030, pH 5.0, 1+ protein, trace leukocyte, 11-30 WBCs, 0-2 RBCs, greater than 10 epithelial cells, 0-10 renal epithelial cells, hyaline casts are present, mucus threads are present and many bacteria.  STAT CT renal stone study revealed left renal cysts and degenerative disc disease through L3-L5.  PMH: Past Medical History:  Diagnosis Date   Anemia    Arthritis    Cancer ([place])    Colon polyp    Depression    Headache    History of 2019 novel coronavirus disease  (COVID-19) 04/09/2021   History of kidney stones    Hyperlipidemia    Hypertension    Hypothyroidism    Irritable bowel disease    Kidney stone    Pneumonia    Thyroid disease     Surgical History: Past Surgical History:  Procedure Laterality Date   ABDOMINAL HYSTERECTOMY     BREAST ENHANCEMENT SURGERY     BREAST SURGERY     CHOLECYSTECTOMY  2000   COLECTOMY  09/2013   COLONOSCOPY  09-13-13   Dr [name]   COLONOSCOPY WITH PROPOFOL N/A 10/18/2014   Procedure: COLONOSCOPY WITH PROPOFOL;  Surgeon: [name], MD;  Location: [facility];  Service: Endoscopy;  Laterality: N/A;   CYSTOSCOPY WITH URETEROSCOPY AND STENT PLACEMENT Right 04/09/2021   Procedure: CYSTOSCOPY WITH URETEROSCOPY AND STENT PLACEMENT;  Surgeon: Abbie Sons, MD;  Location: [facility];  Service: Urology;  Laterality: Right;   CYSTOSCOPY/URETEROSCOPY/HOLMIUM LASER/STENT PLACEMENT Right 06/23/2017   Procedure: CYSTOSCOPY/URETEROSCOPY/HOLMIUM LASER/STENT PLACEMENT;  Surgeon: Abbie Sons, MD;  Location: [facility];  Service: Urology;  Laterality: Right;   CYSTOSCOPY/URETEROSCOPY/HOLMIUM LASER/STENT PLACEMENT Right 05/07/2021   Procedure: CYSTOSCOPY/URETEROSCOPY/HOLMIUM LASER/STENT PLACEMENT;  Surgeon: Abbie Sons, MD;  Location: [facility];  Service: Urology;  Laterality: Right;   ESOPHAGOGASTRODUODENOSCOPY (EGD) WITH PROPOFOL N/A 01/03/2015   Procedure: ESOPHAGOGASTRODUODENOSCOPY (EGD) WITH PROPOFOL;  Surgeon: [name], MD;  Location: [facility];  Service: Endoscopy;  Laterality: N/A;   EYE SURGERY     bilateral cataract    FOOT SURGERY  KIDNEY STONE SURGERY     NASAL SINUS SURGERY     PORT-A-CATH REMOVAL     PORTACATH PLACEMENT  10/24/13   THYROID SURGERY     TONSILLECTOMY      Home Medications:  Allergies as of 06/17/2022       Reactions   Codeine Nausea And Vomiting   Oxycodone Nausea And Vomiting   Penicillins Nausea And Vomiting   Has patient had a PCN reaction causing  immediate rash, facial/tongue/throat swelling, SOB or lightheadedness with hypotension: No Has patient had a PCN reaction causing severe rash involving mucus membranes or skin necrosis: No Has patient had a PCN reaction that required hospitalization: No Has patient had a PCN reaction occurring within the last 10 years: No If all of the above answers are "NO", then may proceed with Cephalosporin use.   Tramadol Nausea And Vomiting   Trintellix [vortioxetine] Itching        Medication List        Accurate as of June 17, 2022 11:59 PM. If you have any questions, ask your nurse or doctor.          aspirin-acetaminophen-caffeine 250-250-65 MG tablet Commonly known as: EXCEDRIN MIGRAINE Take 2 tablets by mouth 2 (two) times daily as needed for headache or migraine.   hydrochlorothiazide 25 MG tablet Commonly known as: HYDRODIURIL Take 1 tablet (25 mg total) by mouth daily.   levothyroxine 112 MCG tablet Commonly known as: SYNTHROID TAKE 1 TABLET BY MOUTH ONCE DAILY BEFORE BREAKFAST   meloxicam 15 MG tablet Commonly known as: MOBIC Take 1 tablet (15 mg total) by mouth daily.   metoprolol succinate 100 MG 24 hr tablet Commonly known as: TOPROL-XL TAKE 1 TABLET BY MOUTH ONCE DAILY WITH  OR  IMMEDIATELY  FOLLOWING  A  MEAL   mirtazapine 45 MG tablet Commonly known as: REMERON Take 1 tablet (45 mg total) by mouth at bedtime.   ondansetron 4 MG disintegrating tablet Commonly known as: ZOFRAN-ODT Take 1 tablet (4 mg total) by mouth every 8 (eight) hours as needed for nausea or vomiting.   rosuvastatin 20 MG tablet Commonly known as: Crestor Take 1 tablet (20 mg total) by mouth daily.        Allergies:  Allergies  Allergen Reactions   Codeine Nausea And Vomiting   Oxycodone Nausea And Vomiting   Penicillins Nausea And Vomiting    Has patient had a PCN reaction causing immediate rash, facial/tongue/throat swelling, SOB or lightheadedness with hypotension: No Has  patient had a PCN reaction causing severe rash involving mucus membranes or skin necrosis: No Has patient had a PCN reaction that required hospitalization: No Has patient had a PCN reaction occurring within the last 10 years: No If all of the above answers are "NO", then may proceed with Cephalosporin use.    Tramadol Nausea And Vomiting   Trintellix [Vortioxetine] Itching    Family History: Family History  Problem Relation Age of Onset   Heart disease Mother    Diabetes Mother    Arthritis Sister    Hyperlipidemia Sister    COPD Brother    Diabetes Sister    Colon cancer Other        first cousin   Breast cancer Maternal Aunt    Breast cancer Paternal Aunt     Social History:  reports that she has been smoking cigarettes. She has been exposed to tobacco smoke. She has never used smokeless tobacco. She reports current alcohol use. She reports that  she does not use drugs.  ROS: Pertinent ROS in HPI  Physical Exam: BP 119/79   Pulse 65   Ht 5\' 6"  (1.676 m)   Wt 172 lb (78 kg)   BMI 27.76 kg/m   Constitutional:  Well nourished. Alert and oriented, No acute distress. HEENT: [place] AT, moist mucus membranes.  Trachea midline Cardiovascular: No clubbing, cyanosis, or edema. Respiratory: Normal respiratory effort, no increased work of breathing. Neurologic: Grossly intact, no focal deficits, moving all 4 extremities. Psychiatric: Normal mood and affect.    Laboratory Data: Lab Results  Component Value Date   WBC 4.8 05/23/2022   HGB 10.8 (L) 05/23/2022   HCT 35.1 (L) 05/23/2022   MCV 95.6 05/23/2022   PLT 205 05/23/2022    Lab Results  Component Value Date   CREATININE 0.95 05/23/2022    Lab Results  Component Value Date   HGBA1C 5.6 04/01/2022    Lab Results  Component Value Date   TSH 49.100 (H) 04/01/2022       Component Value Date/Time   CHOL 240 (H) 04/01/2022 1319   HDL 72 04/01/2022 1319   CHOLHDL 3.3 04/01/2022 1319   LDLCALC 149 (H) 04/01/2022  1319    Lab Results  Component Value Date   AST 21 04/01/2022   Lab Results  Component Value Date   ALT 13 04/01/2022   Urinalysis Results for orders placed or performed in visit on 06/17/22  Microscopic Examination   Urine  Result Value Ref Range   WBC, UA 11-30 (A) 0 - 5 /hpf   RBC, Urine 0-2 0 - 2 /hpf   Epithelial Cells (non renal) >10 (A) 0 - 10 /hpf   Renal Epithel, UA 0-10 (A) None seen /hpf   Casts Present (A) None seen /lpf   Cast Type Hyaline casts N/A   Mucus, UA Present (A) Not Estab.   Bacteria, UA Many (A) None seen/Few  Urinalysis, Complete  Result Value Ref Range   Specific Gravity, UA >1.030 (H) 1.005 - 1.030   pH, UA 5.0 5.0 - 7.5   Color, UA Yellow Yellow   Appearance Ur Hazy (A) Clear   Leukocytes,UA Trace (A) Negative   Protein,UA 1+ (A) Negative/Trace   Glucose, UA Negative Negative   Ketones, UA Negative Negative   RBC, UA Negative Negative   Bilirubin, UA Negative Negative   Urobilinogen, Ur 0.2 0.2 - 1.0 mg/dL   Nitrite, UA Negative Negative   Microscopic Examination See below:   I have reviewed the labs.   Pertinent Imaging: CLINICAL DATA:  Left flank pain.   EXAM: ABDOMEN - 1 VIEW   COMPARISON:  March 25, 2022.   FINDINGS: The bowel gas pattern is normal. Status post cholecystectomy. Stable left nephrolithiasis is noted. Phleboliths are noted in the pelvis.   IMPRESSION: Stable left nephrolithiasis.     Electronically Signed   By: Marijo Conception M.D.   On: 05/23/2022 15:31  Narrative & Impression  CLINICAL DATA:  Abdominal/flank pain.  Evaluate for kidney stone.   EXAM: CT ABDOMEN AND PELVIS WITHOUT CONTRAST   TECHNIQUE: Multidetector CT imaging of the abdomen and pelvis was performed following the standard protocol without IV contrast.   RADIATION DOSE REDUCTION: This exam was performed according to the departmental dose-optimization program which includes automated exposure control, adjustment of the mA and/or kV  according to patient size and/or use of iterative reconstruction technique.   COMPARISON:  09/23/2021   FINDINGS: Lower chest: No acute abnormality.  Hepatobiliary: No focal liver abnormality is seen. Status post cholecystectomy. No biliary dilatation.   Pancreas: Unremarkable. No pancreatic ductal dilatation or surrounding inflammatory changes.   Spleen: Normal in size without focal abnormality.   Adrenals/Urinary Tract: Normal adrenal glands. Multiple left kidney cysts identified. The largest arises off the posterior cortex of the interpolar left kidney measuring 6.5 cm. No follow-up imaging recommended. Multiple left renal calculi. The largest stone is in the upper pole collecting system measuring 7 mm, image 18/2 several punctate stones identified within the upper and lower pole of the left kidney. The largest is in the inferior pole measuring 2 mm. No hydronephrosis identified bilaterally. No hydroureter or ureteral calculi. Bladder is unremarkable.   Stomach/Bowel: Stomach appears within normal limits. Status post right hemicolectomy with enterocolonic anastomosis. No bowel wall thickening, inflammation, or distension.   Vascular/Lymphatic: Aortic atherosclerosis. No aneurysm. No abdominopelvic adenopathy.   Reproductive: Status post hysterectomy. No adnexal masses.   Other: No ascites or fluid collections. No signs of pneumoperitoneum.   Musculoskeletal: No acute or significant osseous findings. L3-4 and L4-5 degenerative disc disease.   IMPRESSION: 1. No acute findings within the abdomen or pelvis. 2. Bilateral nonobstructing renal calculi. 3. Status post right hemicolectomy with enterocolonic anastomosis. 4.  Aortic Atherosclerosis (ICD10-I70.0).     Electronically Signed   By: Kerby Moors M.D.   On: 06/17/2022 14:36   I have independently reviewed the films.    Assessment & Plan:    1.  Nephrolithiasis -KUB from the emergency department was  nondiagnostic, so we will proceed CT renal stone study -No findings on CT scan to explain her pain -I did try to contact her by calling both her mobile and her landline and have not been able to reach her -urine sent for culture for completeness   Return for urine culture results .  These notes generated with voice recognition software. I apologize for typographical errors.  Royden Purl  [facility] [address]  [place], [place] [address] [phone]  I spent 30 minutes on the day of the encounter to include pre-visit record review, face-to-face time with the patient, and post-visit ordering of tests.

## 2022-06-17 ENCOUNTER — Ambulatory Visit (INDEPENDENT_AMBULATORY_CARE_PROVIDER_SITE_OTHER): Payer: 59 | Admitting: Urology

## 2022-06-17 ENCOUNTER — Encounter: Payer: Self-pay | Admitting: Urology

## 2022-06-17 ENCOUNTER — Ambulatory Visit
Admission: RE | Admit: 2022-06-17 | Discharge: 2022-06-17 | Disposition: A | Discharge status: Home / Self Care | Payer: 59 | Source: Ambulatory Visit | Attending: Urology | Admitting: Urology

## 2022-06-17 VITALS — BP 119/79 | HR 65 | Ht 66.0 in | Wt 172.0 lb

## 2022-06-17 DIAGNOSIS — R109 Unspecified abdominal pain: Secondary | ICD-10-CM | POA: Insufficient documentation

## 2022-06-17 DIAGNOSIS — N200 Calculus of kidney: Secondary | ICD-10-CM | POA: Diagnosis not present

## 2022-06-17 DIAGNOSIS — N281 Cyst of kidney, acquired: Secondary | ICD-10-CM | POA: Diagnosis not present

## 2022-06-17 LAB — URINALYSIS, COMPLETE
Bilirubin, UA: NEGATIVE
Glucose, UA: NEGATIVE
Ketones, UA: NEGATIVE
Nitrite, UA: NEGATIVE
RBC, UA: NEGATIVE
Specific Gravity, UA: >1.03 — ABNORMAL HIGH (ref 1.005–1.030)
Urobilinogen, Ur: 0.2 mg/dL (ref 0.2–1.0)
pH, UA: 5 (ref 5.0–7.5)

## 2022-06-17 LAB — MICROSCOPIC EXAMINATION: Epithelial Cells (non renal): >10 /hpf — AB (ref 0–10)

## 2022-06-19 ENCOUNTER — Telehealth: Payer: Self-pay | Admitting: Urology

## 2022-06-19 ENCOUNTER — Telehealth: Payer: Self-pay | Admitting: Physician Assistant

## 2022-06-19 NOTE — Telephone Encounter (Signed)
error 

## 2022-06-19 NOTE — Telephone Encounter (Signed)
I spoke with Mrs. Melissa Fitzgerald and discussed her CT scan results whether and explained that there was no urological etiology for pain and to contact her primary care provider for further evaluation.

## 2022-06-20 ENCOUNTER — Other Ambulatory Visit: Payer: Self-pay | Admitting: Physician Assistant

## 2022-06-20 ENCOUNTER — Telehealth: Payer: Self-pay

## 2022-06-20 LAB — CULTURE, URINE COMPREHENSIVE

## 2022-06-20 MED ORDER — BACLOFEN 10 MG PO TABS: 10.0000 mg | ORAL_TABLET | Freq: Three times a day (TID) | ORAL | 0 refills | Status: DC | PRN

## 2022-06-20 NOTE — Telephone Encounter (Signed)
Advised 

## 2022-06-20 NOTE — Telephone Encounter (Signed)
Copied from [place] [phone]. Topic: General - Other >> Jun 19, 2022  4:46 PM Denman George T wrote: Reason for CRM: Patient called said she went to see Dr Bernardo Heater on Tuesday and is requesting some medication for the pain in her right side. She was advise by Dr Bernardo Heater office it was not her kidneys it was muscular. Please advise if she needs to come in for a visit first.

## 2022-06-23 ENCOUNTER — Ambulatory Visit: Payer: Self-pay | Admitting: *Deleted

## 2022-06-23 ENCOUNTER — Telehealth: Payer: Self-pay | Admitting: Family Medicine

## 2022-06-23 NOTE — Telephone Encounter (Signed)
Patient notified and voiced understanding.

## 2022-06-23 NOTE — Telephone Encounter (Signed)
med for back pain not working   Per agent:"Pt  called in says med, Baclofen for her back pain , isnt working and is asking for an alternativeDoctor, hospital Complaint: Back pain Symptoms: Lower back pain, radiates down right leg. 10/10 at times, worse at night, varies throughout day. States "They thought it was a kidney stone but CT said differently.' States told "Muscular.'  Has been on Baclofen, "Not helping at all." Frequency: "! Month" Pertinent Negatives: Patient denies  Disposition: [] ED /[] Urgent Care (no appt availability in office) / [x] Appointment(In office/virtual)/ []  [facility]/ [] Home Care/ [] Refused Recommended Disposition /[] [facility]/ []  Follow-up with PCP Additional Notes: Initially requesting alternate med, has not been seen for "Muscle pain.' Appt secured for tomorrow. Care advise provided, pt verbalizes understanding.  Reason for Disposition  [1] Pain radiates into the thigh or further down the leg AND [2] one leg  Answer Assessment - Initial Assessment Questions 1. ONSET: "When did the pain begin?"      One month 2. LOCATION: "Where does it hurt?" (upper, mid or lower back)     Lower back down right leg 3. SEVERITY: "How bad is the pain?"  (e.g., Scale 1-10; mild, moderate, or severe)   - MILD (1-3): Doesn't interfere with normal activities.    - MODERATE (4-7): Interferes with normal activities or awakens from sleep.    - SEVERE (8-10): Excruciating pain, unable to do any normal activities.      10/10 4. PATTERN: "Is the pain constant?" (e.g., yes, no; constant, intermittent)      Comes and goes, worse at night 5. RADIATION: "Does the pain shoot into your legs or somewhere else?"     Right leg 6. CAUSE:  "What do you think is causing the back pain?"       7. BACK OVERUSE:  "Any recent lifting of heavy objects, strenuous work or exercise?"     No 8. MEDICINES: "What have you taken so far for the pain?" (e.g., nothing, acetaminophen,  NSAIDS)     Baclofen 9. NEUROLOGIC SYMPTOMS: "Do you have any weakness, numbness, or problems with bowel/bladder control?"    Frequent urination "Had kidney stones." 10. OTHER SYMPTOMS: "Do you have any other symptoms?" (e.g., fever, abdomen pain, burning with urination, blood in urine)      no  Protocols used: Back Pain-A-AH

## 2022-06-23 NOTE — Telephone Encounter (Signed)
-----   Message from Nori Riis, PA-C sent at 06/22/2022  4:59 PM EDT ----- Please let Mrs. Reicher know that her urine culture was negative for infection.

## 2022-06-24 ENCOUNTER — Ambulatory Visit: Payer: 59 | Admitting: Physician Assistant

## 2022-06-25 NOTE — Progress Notes (Unsigned)
      Established patient visit   Patient: Melissa Fitzgerald   DOB: Jun 03, 1945   77 y.o. Female  MRN: [id number] Visit Date: 06/26/2022  Today's healthcare provider: [name], PA-C   No chief complaint on file.  Subjective     Follow up for acute right-sided low back pain w/o sciatica  The patient was last seen for this 4 weeks ago. Changes made at last visit include Rx tramadol x 5 days .  She reports {excellent/good/fair/poor:19665} compliance with treatment. She feels that condition is {improved/worse/unchanged:3041574}. She {is/is not:21021397} having side effects. ***  -----------------------------------------------------------------------------------------   Medications: Outpatient Medications Prior to Visit  Medication Sig   aspirin-acetaminophen-caffeine (EXCEDRIN MIGRAINE) 250-250-65 MG tablet Take 2 tablets by mouth 2 (two) times daily as needed for headache or migraine.   baclofen (LIORESAL) 10 MG tablet Take 1 tablet (10 mg total) by mouth 3 (three) times daily as needed (for back pain).   hydrochlorothiazide (HYDRODIURIL) 25 MG tablet Take 1 tablet (25 mg total) by mouth daily.   levothyroxine (SYNTHROID) 112 MCG tablet TAKE 1 TABLET BY MOUTH ONCE DAILY BEFORE BREAKFAST   meloxicam (MOBIC) 15 MG tablet Take 1 tablet (15 mg total) by mouth daily.   metoprolol succinate (TOPROL-XL) 100 MG 24 hr tablet TAKE 1 TABLET BY MOUTH ONCE DAILY WITH  OR  IMMEDIATELY  FOLLOWING  A  MEAL   mirtazapine (REMERON) 45 MG tablet Take 1 tablet (45 mg total) by mouth at bedtime.   ondansetron (ZOFRAN-ODT) 4 MG disintegrating tablet Take 1 tablet (4 mg total) by mouth every 8 (eight) hours as needed for nausea or vomiting.   rosuvastatin (CRESTOR) 20 MG tablet Take 1 tablet (20 mg total) by mouth daily.   No facility-administered medications prior to visit.    Review of Systems  Musculoskeletal:  Positive for back pain.    {Labs  Heme  Chem  Endocrine  Serology  Results  Review (optional):23779}   Objective    There were no vitals taken for this visit. {Show previous vital signs (optional):23777}  Physical Exam  ***  No results found for any visits on 06/26/22.  Assessment & Plan     ***  No follow-ups on file.      {provider attestation***:1}   [name], PA-C  [facility] [phone] (phone) [phone] (fax)  [place]

## 2022-06-26 ENCOUNTER — Encounter: Payer: Self-pay | Admitting: Physician Assistant

## 2022-06-26 ENCOUNTER — Ambulatory Visit (INDEPENDENT_AMBULATORY_CARE_PROVIDER_SITE_OTHER): Payer: 59 | Admitting: Physician Assistant

## 2022-06-26 VITALS — BP 101/83 | HR 79 | Temp 98.2°F | Wt 166.0 lb

## 2022-06-26 DIAGNOSIS — M25551 Pain in right hip: Secondary | ICD-10-CM

## 2022-07-02 DIAGNOSIS — M545 Low back pain, unspecified: Secondary | ICD-10-CM | POA: Diagnosis not present

## 2022-07-02 DIAGNOSIS — M5416 Radiculopathy, lumbar region: Secondary | ICD-10-CM | POA: Diagnosis not present

## 2022-07-11 ENCOUNTER — Other Ambulatory Visit: Payer: Self-pay | Admitting: Physician Assistant

## 2022-07-11 DIAGNOSIS — E890 Postprocedural hypothyroidism: Secondary | ICD-10-CM

## 2022-07-14 ENCOUNTER — Ambulatory Visit (INDEPENDENT_AMBULATORY_CARE_PROVIDER_SITE_OTHER): Payer: 59

## 2022-07-14 VITALS — Ht 66.0 in | Wt 166.0 lb

## 2022-07-14 DIAGNOSIS — Z0000 Encounter for general adult medical examination without abnormal findings: Secondary | ICD-10-CM

## 2022-07-14 DIAGNOSIS — H539 Unspecified visual disturbance: Secondary | ICD-10-CM

## 2022-07-14 NOTE — Patient Instructions (Signed)
Melissa Fitzgerald , Thank you for taking time to come for your Medicare Wellness Visit. I appreciate your ongoing commitment to your health goals. Please review the following plan we discussed and let me know if I can assist you in the future.   These are the goals we discussed:  Goals      DIET - EAT MORE FRUITS AND VEGETABLES     Exercise      Recommend starting to exercise. Pt is going to start going to the gym 3 days a week for 1 hour.     Exercise 3x per week (30 min per time)     Recommend to start walking 3 days a week for at least 30 minutes at a time.         This is a list of the screening recommended for you and due dates:  Health Maintenance  Topic Date Due   Colon Cancer Screening  10/17/2017   COVID-19 Vaccine (4 - 2023-24 season) 11/22/2021   DEXA scan (bone density measurement)  03/24/2026*   Flu Shot  10/23/2022   Medicare Annual Wellness Visit  07/14/2023   DTaP/Tdap/Td vaccine (4 - Td or Tdap) 05/15/2024   Pneumonia Vaccine  Completed   Hepatitis C Screening: USPSTF Recommendation to screen - Ages 85-79 yo.  Completed   Zoster (Shingles) Vaccine  Completed   HPV Vaccine  Aged Out  *Topic was postponed. The date shown is not the original due date.    Advanced directives: no  Conditions/risks identified: none  Next appointment: Follow up in one year for your annual wellness visit 07/15/2023 :15 telephone   Preventive Care 65 Years and Older, Female Preventive care refers to lifestyle choices and visits with your health care provider that can promote health and wellness. What does preventive care include? A yearly physical exam. This is also called an annual well check. Dental exams once or twice a year. Routine eye exams. Ask your health care provider how often you should have your eyes checked. Personal lifestyle choices, including: Daily care of your teeth and gums. Regular physical activity. Eating a healthy diet. Avoiding tobacco and drug  use. Limiting alcohol use. Practicing safe sex. Taking low-dose aspirin every day. Taking vitamin and mineral supplements as recommended by your health care provider. What happens during an annual well check? The services and screenings done by your health care provider during your annual well check will depend on your age, overall health, lifestyle risk factors, and family history of disease. Counseling  Your health care provider may ask you questions about your: Alcohol use. Tobacco use. Drug use. Emotional well-being. Home and relationship well-being. Sexual activity. Eating habits. History of falls. Memory and ability to understand (cognition). Work and work Astronomer. Reproductive health. Screening  You may have the following tests or measurements: Height, weight, and BMI. Blood pressure. Lipid and cholesterol levels. These may be checked every 5 years, or more frequently if you are over 49 years old. Skin check. Lung cancer screening. You may have this screening every year starting at age 9 if you have a 30-pack-year history of smoking and currently smoke or have quit within the past 15 years. Fecal occult blood test (FOBT) of the stool. You may have this test every year starting at age 85. Flexible sigmoidoscopy or colonoscopy. You may have a sigmoidoscopy every 5 years or a colonoscopy every 10 years starting at age 61. Hepatitis C blood test. Hepatitis B blood test. Sexually transmitted disease (STD) testing. Diabetes  screening. This is done by checking your blood sugar (glucose) after you have not eaten for a while (fasting). You may have this done every 1-3 years. Bone density scan. This is done to screen for osteoporosis. You may have this done starting at age 19. Mammogram. This may be done every 1-2 years. Talk to your health care provider about how often you should have regular mammograms. Talk with your health care provider about your test results, treatment  options, and if necessary, the need for more tests. Vaccines  Your health care provider may recommend certain vaccines, such as: Influenza vaccine. This is recommended every year. Tetanus, diphtheria, and acellular pertussis (Tdap, Td) vaccine. You may need a Td booster every 10 years. Zoster vaccine. You may need this after age 40. Pneumococcal 13-valent conjugate (PCV13) vaccine. One dose is recommended after age 55. Pneumococcal polysaccharide (PPSV23) vaccine. One dose is recommended after age 66. Talk to your health care provider about which screenings and vaccines you need and how often you need them. This information is not intended to replace advice given to you by your health care provider. Make sure you discuss any questions you have with your health care provider. Document Released: 04/06/2015 Document Revised: 11/28/2015 Document Reviewed: 01/09/2015 Elsevier Interactive Patient Education  2017 [employer] Prevention in the Home Falls can cause injuries. They can happen to people of all ages. There are many things you can do to make your home safe and to help prevent falls. What can I do on the outside of my home? Regularly fix the edges of walkways and driveways and fix any cracks. Remove anything that might make you trip as you walk through a door, such as a raised step or threshold. Trim any bushes or trees on the path to your home. Use bright outdoor lighting. Clear any walking paths of anything that might make someone trip, such as rocks or tools. Regularly check to see if handrails are loose or broken. Make sure that both sides of any steps have handrails. Any raised decks and porches should have guardrails on the edges. Have any leaves, snow, or ice cleared regularly. Use sand or salt on walking paths during winter. Clean up any spills in your garage right away. This includes oil or grease spills. What can I do in the bathroom? Use night lights. Install grab  bars by the toilet and in the tub and shower. Do not use towel bars as grab bars. Use non-skid mats or decals in the tub or shower. If you need to sit down in the shower, use a plastic, non-slip stool. Keep the floor dry. Clean up any water that spills on the floor as soon as it happens. Remove soap buildup in the tub or shower regularly. Attach bath mats securely with double-sided non-slip rug tape. Do not have throw rugs and other things on the floor that can make you trip. What can I do in the bedroom? Use night lights. Make sure that you have a light by your bed that is easy to reach. Do not use any sheets or blankets that are too big for your bed. They should not hang down onto the floor. Have a firm chair that has side arms. You can use this for support while you get dressed. Do not have throw rugs and other things on the floor that can make you trip. What can I do in the kitchen? Clean up any spills right away. Avoid walking on wet floors. Keep  items that you use a lot in easy-to-reach places. If you need to reach something above you, use a strong step stool that has a grab bar. Keep electrical cords out of the way. Do not use floor polish or wax that makes floors slippery. If you must use wax, use non-skid floor wax. Do not have throw rugs and other things on the floor that can make you trip. What can I do with my stairs? Do not leave any items on the stairs. Make sure that there are handrails on both sides of the stairs and use them. Fix handrails that are broken or loose. Make sure that handrails are as long as the stairways. Check any carpeting to make sure that it is firmly attached to the stairs. Fix any carpet that is loose or worn. Avoid having throw rugs at the top or bottom of the stairs. If you do have throw rugs, attach them to the floor with carpet tape. Make sure that you have a light switch at the top of the stairs and the bottom of the stairs. If you do not have them,  ask someone to add them for you. What else can I do to help prevent falls? Wear shoes that: Do not have high heels. Have rubber bottoms. Are comfortable and fit you well. Are closed at the toe. Do not wear sandals. If you use a stepladder: Make sure that it is fully opened. Do not climb a closed stepladder. Make sure that both sides of the stepladder are locked into place. Ask someone to hold it for you, if possible. Clearly mark and make sure that you can see: Any grab bars or handrails. First and last steps. Where the edge of each step is. Use tools that help you move around (mobility aids) if they are needed. These include: Canes. Walkers. Scooters. Crutches. Turn on the lights when you go into a dark area. Replace any light bulbs as soon as they burn out. Set up your furniture so you have a clear path. Avoid moving your furniture around. If any of your floors are uneven, fix them. If there are any pets around you, be aware of where they are. Review your medicines with your doctor. Some medicines can make you feel dizzy. This can increase your chance of falling. Ask your doctor what other things that you can do to help prevent falls. This information is not intended to replace advice given to you by your health care provider. Make sure you discuss any questions you have with your health care provider. Document Released: 01/04/2009 Document Revised: 08/16/2015 Document Reviewed: 04/14/2014 Elsevier Interactive Patient Education  2017 [employer].

## 2022-07-14 NOTE — Addendum Note (Signed)
Addended by: Sue Lush on: 07/14/2022 11:35 AM   Modules accepted: Orders

## 2022-07-14 NOTE — Progress Notes (Signed)
I connected with  Sunny Schlein on 07/14/22 by a audio enabled telemedicine application and verified that I am speaking with the correct person using two identifiers.  Patient Location: Home  Provider Location: Office/Clinic  I discussed the limitations of evaluation and management by telemedicine. The patient expressed understanding and agreed to proceed.  Subjective:   Melissa Fitzgerald is a 77 y.o. female who presents for Medicare Annual (Subsequent) preventive examination.  Review of Systems    Cardiac Risk Factors include: advanced age (>61men, >84 women);dyslipidemia;hypertension;sedentary lifestyle    Objective:    Today's Vitals   07/14/22 1015 07/14/22 1017  Weight: 166 lb (75.3 kg)   Height: 5\' 6"  (1.676 m)   PainSc:  7    Body mass index is 26.79 kg/m.     07/14/2022   10:29 AM 05/23/2022    1:51 PM 07/10/2021    9:10 AM 05/07/2021    9:21 AM 04/30/2021    1:01 PM 04/09/2021    2:00 PM 04/09/2021    9:02 AM  Advanced Directives  Does Patient Have a Medical Advance Directive? No No No Yes No No No  Type of Advance Directive    Living will     Would patient like information on creating a medical advance directive?  No - Patient declined No - Patient declined   No - Patient declined No - Patient declined    Current Medications (verified) Outpatient Encounter Medications as of 07/14/2022  Medication Sig   aspirin-acetaminophen-caffeine (EXCEDRIN MIGRAINE) 250-250-65 MG tablet Take 2 tablets by mouth 2 (two) times daily as needed for headache or migraine.   baclofen (LIORESAL) 10 MG tablet Take 1 tablet (10 mg total) by mouth 3 (three) times daily as needed (for back pain).   hydrochlorothiazide (HYDRODIURIL) 25 MG tablet Take 1 tablet (25 mg total) by mouth daily.   levothyroxine (SYNTHROID) 112 MCG tablet TAKE 1 TABLET BY MOUTH ONCE DAILY BEFORE BREAKFAST. APPOINTMENT NEEDED FOR FURTHER REFILLS.   meloxicam (MOBIC) 15 MG tablet Take 1 tablet (15 mg total) by mouth daily.    metoprolol succinate (TOPROL-XL) 100 MG 24 hr tablet TAKE 1 TABLET BY MOUTH ONCE DAILY WITH  OR  IMMEDIATELY  FOLLOWING  A  MEAL   mirtazapine (REMERON) 45 MG tablet Take 1 tablet (45 mg total) by mouth at bedtime.   ondansetron (ZOFRAN-ODT) 4 MG disintegrating tablet Take 1 tablet (4 mg total) by mouth every 8 (eight) hours as needed for nausea or vomiting.   rosuvastatin (CRESTOR) 20 MG tablet Take 1 tablet (20 mg total) by mouth daily.   No facility-administered encounter medications on file as of 07/14/2022.    Allergies (verified) Codeine, Oxycodone, Penicillins, Tramadol, and Trintellix [vortioxetine]   History: Past Medical History:  Diagnosis Date   Anemia    Arthritis    Cancer    Colon polyp    Depression    Headache    History of 2019 novel coronavirus disease (COVID-19) 04/09/2021   History of kidney stones    Hyperlipidemia    Hypertension    Hypothyroidism    Irritable bowel disease    Kidney stone    Pneumonia    Thyroid disease    Past Surgical History:  Procedure Laterality Date   ABDOMINAL HYSTERECTOMY     BREAST ENHANCEMENT SURGERY     BREAST SURGERY     CHOLECYSTECTOMY  2000   COLECTOMY  09/2013   COLONOSCOPY  09-13-13   Dr Ricki Rodriguez   COLONOSCOPY WITH  PROPOFOL N/A 10/18/2014   Procedure: COLONOSCOPY WITH PROPOFOL;  Surgeon: Kieth Brightly, MD;  Location: [facility];  Service: Endoscopy;  Laterality: N/A;   CYSTOSCOPY WITH URETEROSCOPY AND STENT PLACEMENT Right 04/09/2021   Procedure: CYSTOSCOPY WITH URETEROSCOPY AND STENT PLACEMENT;  Surgeon: Riki Altes, MD;  Location: [facility];  Service: Urology;  Laterality: Right;   CYSTOSCOPY/URETEROSCOPY/HOLMIUM LASER/STENT PLACEMENT Right 06/23/2017   Procedure: CYSTOSCOPY/URETEROSCOPY/HOLMIUM LASER/STENT PLACEMENT;  Surgeon: Riki Altes, MD;  Location: [facility];  Service: Urology;  Laterality: Right;   CYSTOSCOPY/URETEROSCOPY/HOLMIUM LASER/STENT PLACEMENT Right 05/07/2021   Procedure:  CYSTOSCOPY/URETEROSCOPY/HOLMIUM LASER/STENT PLACEMENT;  Surgeon: Riki Altes, MD;  Location: [facility];  Service: Urology;  Laterality: Right;   ESOPHAGOGASTRODUODENOSCOPY (EGD) WITH PROPOFOL N/A 01/03/2015   Procedure: ESOPHAGOGASTRODUODENOSCOPY (EGD) WITH PROPOFOL;  Surgeon: Kieth Brightly, MD;  Location: [facility];  Service: Endoscopy;  Laterality: N/A;   EYE SURGERY     bilateral cataract    FOOT SURGERY     KIDNEY STONE SURGERY     NASAL SINUS SURGERY     PORT-A-CATH REMOVAL     PORTACATH PLACEMENT  10/24/13   THYROID SURGERY     TONSILLECTOMY     Family History  Problem Relation Age of Onset   Heart disease Mother    Diabetes Mother    Arthritis Sister    Hyperlipidemia Sister    COPD Brother    Diabetes Sister    Colon cancer Other        first cousin   Breast cancer Maternal Aunt    Breast cancer Paternal Aunt    Social History   Socioeconomic History   Marital status: Widowed    Spouse name: Not on file   Number of children: 1   Years of education: Not on file   Highest education level: 12th grade  Occupational History   Occupation: retired  Tobacco Use   Smoking status: Some Days    Packs/day: 0    Types: Cigarettes    Last attempt to quit: 03/24/2016    Years since quitting: 6.3    Passive exposure: Current   Smokeless tobacco: Never   Tobacco comments:    still smoking every now and then, rarely one  Vaping Use   Vaping Use: Never used  Substance and Sexual Activity   Alcohol use: Yes    Alcohol/week: 0.0 standard drinks of alcohol    Comment: occasionally   Drug use: No   Sexual activity: Yes  Other Topics Concern   Not on file  Social History Narrative   Not on file   Social Determinants of Health   Financial Resource Strain: Low Risk  (07/14/2022)   Overall Financial Resource Strain (CARDIA)    Difficulty of Paying Living Expenses: Not hard at all  Food Insecurity: No Food Insecurity (07/10/2021)   Hunger Vital Sign    Worried  About Running Out of Food in the Last Year: Never true    Ran Out of Food in the Last Year: Never true  Transportation Needs: No Transportation Needs (07/14/2022)   PRAPARE - Administrator, Civil Service (Medical): No    Lack of Transportation (Non-Medical): No  Physical Activity: Insufficiently Active (07/10/2021)   Exercise Vital Sign    Days of Exercise per Week: 2 days    Minutes of Exercise per Session: 20 min  Stress: Stress Concern Present (07/14/2022)   [employer] of Occupational Health - Occupational Stress Questionnaire    Feeling of Stress :  To some extent  Social Connections: Socially Isolated (07/14/2022)   Social Connection and Isolation Panel [NHANES]    Frequency of Communication with Friends and Family: More than three times a week    Frequency of Social Gatherings with Friends and Family: Once a week    Attends Religious Services: Never    Database administrator or Organizations: No    Attends Banker Meetings: Never    Marital Status: Widowed    Tobacco Counseling Ready to quit: Not Answered Counseling given: Not Answered Tobacco comments: still smoking every now and then, rarely one   Clinical Intake:  Pre-visit preparation completed: Yes  Pain : 0-10 Pain Score: 7  Pain Type: Chronic pain Pain Location: Hip Pain Orientation: Right Pain Descriptors / Indicators: Aching, Sharp Pain Onset: 1 to 4 weeks ago Pain Frequency: Intermittent Pain Relieving Factors: medication, heat  Pain Relieving Factors: medication, heat  BMI - recorded: 26.79 Nutritional Status: BMI 25 -29 Overweight Nutritional Risks: None Diabetes: No  How often do you need to have someone help you when you read instructions, pamphlets, or other written materials from your doctor or pharmacy?: 1 - Never  Diabetic?no  Interpreter Needed?: No Comments: daughter lives with pt Information entered by :: B.Sircharles Holzheimer,LPN   Activities of Daily Living     07/14/2022   10:29 AM 06/26/2022    1:15 PM  In your present state of health, do you have any difficulty performing the following activities:  Hearing? 0 0  Vision? 0 0  Difficulty concentrating or making decisions? 0 0  Walking or climbing stairs? 1 1  Dressing or bathing? 1 1  Doing errands, shopping? 1 1  Preparing Food and eating ? N   Using the Toilet? N   In the past six months, have you accidently leaked urine? N   Do you have problems with loss of bowel control? N   Managing your Medications? N   Managing your Finances? N   Housekeeping or managing your Housekeeping? N     Patient Care Team: Alfredia Ferguson, PA-C as PCP - General (Physician Assistant) Rosey Bath, MD (Inactive) as Referring Physician (Hematology and Oncology) Marlene Bast as Consulting Physician (Optometry) Bud Face, MD as Referring Physician (Otolaryngology)  Indicate any recent Medical Services you may have received from other than [facility] providers in the past year (date may be approximate).     Assessment:   This is a routine wellness examination for Melissa Fitzgerald.  Hearing/Vision screen Hearing Screening - Comments:: Adequate hearing but has echo in rt ear Vision Screening - Comments:: Adequate vision; readers only [place] -recently retired was treating for floaters  Dietary issues and exercise activities discussed: Current Exercise Habits: The patient has a physically strenuous job, but has no regular exercise apart from work., Exercise limited by: orthopedic condition(s)   Goals Addressed             This Visit's Progress    DIET - EAT MORE FRUITS AND VEGETABLES   Not on track    Exercise    Not on track    Recommend starting to exercise. Pt is going to start going to the gym 3 days a week for 1 hour.     Exercise 3x per week (30 min per time)   Not on track    Recommend to start walking 3 days a week for at least 30 minutes at a time.        Depression Screen  07/14/2022   10:26 AM 06/26/2022    1:15 PM 05/29/2022    2:56 PM 07/10/2021    9:06 AM 05/28/2021    2:42 PM 01/16/2020   12:54 PM 11/02/2019   11:07 AM  PHQ 2/9 Scores  PHQ - 2 Score 0 2 0 2  PHQ- 9 Score Fall Risk    07/14/2022   10:22 AM 06/26/2022    1:15 PM 07/10/2021    9:11 AM 05/28/2021    2:42 PM 01/16/2020   12:54 PM  Fall Risk   Falls in the past year? 0 0 0 0 0  Number falls in past yr: 0 0 0 0   Injury with Fall? 0 0 0 0   Risk for fall due to : No Fall Risks  No Fall Risks No Fall Risks   Follow up Education provided;Falls prevention discussed  Falls evaluation completed      FALL RISK PREVENTION PERTAINING TO THE HOME:  Any stairs in or around the home? Yes  If so, are there any without handrails? Yes  Home free of loose throw rugs in walkways, pet beds, electrical cords, etc? Yes  Adequate lighting in your home to reduce risk of falls? Yes   ASSISTIVE DEVICES UTILIZED TO PREVENT FALLS:  Life alert? No  Use of a cane, walker or w/c? No  Grab bars in the bathroom? Yes  Shower chair or bench in shower? Yes  Elevated toilet seat or a handicapped toilet? Yes    Cognitive Function:        07/14/2022   10:31 AM 11/01/2018   10:49 AM 06/18/2016    3:31 PM  6CIT Screen  What Year? 0 points 0 points 0 points  What month? 0 points 0 points 3 points  What time? 0 points 0 points 0 points  Count back from 20 0 points 0 points 0 points  Months in reverse 0 points 0 points 0 points  Repeat phrase 6 points 4 points 8 points  Total Score 6 points 4 points 11 points    Immunizations Immunization History  Administered Date(s) Administered   Fluad Quad(high Dose 65+) 01/04/2019, 12/10/2021   Influenza, High Dose Seasonal PF 02/04/2018   MMR 12/24/1994   PFIZER(Purple Top)SARS-COV-2 Vaccination 05/19/2019, 06/14/2019, 04/02/2020   Pneumococcal Conjugate-13 06/18/2016   Pneumococcal Polysaccharide-23 07/21/2017   Td 12/17/1994, 09/03/2004   Tdap  05/15/2014   Zoster Recombinat (Shingrix) 02/11/2019, 04/02/2019   Zoster, Live 09/30/2011    TDAP status: Up to date  Flu Vaccine status: Up to date  Pneumococcal vaccine status: Up to date  Covid-19 vaccine status: Completed vaccines  Qualifies for Shingles Vaccine? Yes   Zostavax completed Yes   Shingrix Completed?: Yes  Screening Tests Health Maintenance  Topic Date Due   COLONOSCOPY (Pts 45-26yrs Insurance coverage will need to be confirmed)  10/17/2017   COVID-19 Vaccine (4 - 2023-24 season) 11/22/2021   DEXA SCAN  03/24/2026 (Originally 01/22/2011)   INFLUENZA VACCINE  10/23/2022   Medicare Annual Wellness (AWV)  07/14/2023   DTaP/Tdap/Td (4 - Td or Tdap) 05/15/2024   Pneumonia Vaccine 15+ Years old  Completed   Hepatitis C Screening  Completed   Zoster Vaccines- Shingrix  Completed   HPV VACCINES  Aged Out    Health Maintenance  Health Maintenance Due  Topic Date Due   COLONOSCOPY (Pts 45-41yrs Insurance coverage will need to be confirmed)  10/17/2017  COVID-19 Vaccine (4 - 2023-24 season) 11/22/2021    Colorectal cancer screening: No longer required.   Mammogram status: No longer required due to age.   Lung Cancer Screening: (Low Dose CT Chest recommended if Age 76-80 years, 30 pack-year currently smoking OR have quit w/in 15years.) does qualify.   Lung Cancer Screening Referral: no pt not desires  Additional Screening:  Hepatitis C Screening: does not qualify; Completed yes  Vision Screening: Recommended annual ophthalmology exams for early detection of glaucoma and other disorders of the eye. Is the patient up to date with their annual eye exam?  No  Who is the provider or what is the name of the office in which the patient attends annual eye exams? Retired now If pt is not established with a provider, would they like to be referred to a provider to establish care? Yes .   Dental Screening: Recommended annual dental exams for proper oral  hygiene  Community Resource Referral / Chronic Care Management: CRR required this visit?  No   CCM required this visit?  No      Plan:     I have personally reviewed and noted the following in the patient's chart:   Medical and social history Use of alcohol, tobacco or illicit drugs  Current medications and supplements including opioid prescriptions. Patient is not currently taking opioid prescriptions. Functional ability and status Nutritional status Physical activity Advanced directives List of other physicians Hospitalizations, surgeries, and ER visits in previous 12 months Vitals Screenings to include cognitive, depression, and falls Referrals and appointments  In addition, I have reviewed and discussed with patient certain preventive protocols, quality metrics, and best practice recommendations. A written personalized care plan for preventive services as well as general preventive health recommendations were provided to patient.     Sue Lush, LPN   1/61/0960   Nurse Notes: The patient states she is doing well and has no concerns or questions at this time.

## 2022-12-12 ENCOUNTER — Other Ambulatory Visit: Payer: Self-pay | Admitting: Physician Assistant

## 2022-12-12 DIAGNOSIS — I10 Essential (primary) hypertension: Secondary | ICD-10-CM

## 2023-04-03 ENCOUNTER — Ambulatory Visit
Admission: RE | Admit: 2023-04-03 | Discharge: 2023-04-03 | Disposition: A | Discharge status: Home / Self Care | Payer: 59 | Source: Ambulatory Visit | Attending: Urology | Admitting: Urology

## 2023-04-03 ENCOUNTER — Encounter: Payer: Self-pay | Admitting: Urology

## 2023-04-03 ENCOUNTER — Ambulatory Visit (INDEPENDENT_AMBULATORY_CARE_PROVIDER_SITE_OTHER): Payer: 59 | Admitting: Urology

## 2023-04-03 VITALS — BP 142/80 | HR 74 | Ht 68.0 in | Wt 166.0 lb

## 2023-04-03 DIAGNOSIS — N200 Calculus of kidney: Secondary | ICD-10-CM

## 2023-04-03 DIAGNOSIS — I878 Other specified disorders of veins: Secondary | ICD-10-CM | POA: Diagnosis not present

## 2023-04-03 DIAGNOSIS — Z9049 Acquired absence of other specified parts of digestive tract: Secondary | ICD-10-CM | POA: Diagnosis not present

## 2023-04-03 LAB — MICROSCOPIC EXAMINATION: Epithelial Cells (non renal): >10 /[HPF] — AB (ref 0–10)

## 2023-04-03 LAB — URINALYSIS, COMPLETE
Bilirubin, UA: NEGATIVE
Glucose, UA: NEGATIVE
Ketones, UA: NEGATIVE
Nitrite, UA: NEGATIVE
Protein,UA: NEGATIVE
RBC, UA: NEGATIVE
Specific Gravity, UA: 1.025 (ref 1.005–1.030)
Urobilinogen, Ur: 0.2 mg/dL (ref 0.2–1.0)
pH, UA: 6 (ref 5.0–7.5)

## 2023-04-03 NOTE — Progress Notes (Signed)
 I, [name], acting as a scribe for [name], MD., have documented all relevant documentation on the behalf of [name], MD, as directed by [name], MD while in the presence of [name], MD.  04/03/2023 9:41 AM   Melissa Fitzgerald 11-Oct-1945 [id number]  Referring provider: Cyndi Shaver, PA-C [address] [place],  [place] [address]  Chief Complaint  Patient presents with   Nephrolithiasis   Urologic history: 1.  Recurrent nephrolithiasis Ureteroscopic removal 10 mm right mid ureteral calculus 06/2017 Ureteroscopic removal right renal calculi 04/2021  HPI: Melissa Fitzgerald is a 78 y.o. female presents for annual follow-up.   Since visit last year, she did see Clotilda Cornwall March 2024 complaining of right flank pain. KUB was non-diagnostic in a STAT CT renal stone study it was performed which showed stable bilateral non-obstructing renal calculi and degenerative disc disease.  She currently has no flank or abdominal pain.  Denies gross hematuria, but states she is concerned that her urine is yellow and she drinks fairly significant volume of water.   PMH: Past Medical History:  Diagnosis Date   Anemia    Arthritis    Cancer (HCC)    Colon polyp    Depression    Headache    History of 2019 novel coronavirus disease (COVID-19) 04/09/2021   History of kidney stones    Hyperlipidemia    Hypertension    Hypothyroidism    Irritable bowel disease    Kidney stone    Pneumonia    Thyroid  disease     Surgical History: Past Surgical History:  Procedure Laterality Date   ABDOMINAL HYSTERECTOMY     BREAST ENHANCEMENT SURGERY     BREAST SURGERY     CHOLECYSTECTOMY  2000   COLECTOMY  09/2013   COLONOSCOPY  09-13-13   Dr Luellen   COLONOSCOPY WITH PROPOFOL  N/A 10/18/2014   Procedure: COLONOSCOPY WITH PROPOFOL ;  Surgeon: Louanne KANDICE Muse, MD;  Location: [facility];  Service: Endoscopy;  Laterality: N/A;   CYSTOSCOPY  WITH URETEROSCOPY AND STENT PLACEMENT Right 04/09/2021   Procedure: CYSTOSCOPY WITH URETEROSCOPY AND STENT PLACEMENT;  Surgeon: Barba Glendia JAYSON, MD;  Location: [facility];  Service: Urology;  Laterality: Right;   CYSTOSCOPY/URETEROSCOPY/HOLMIUM LASER/STENT PLACEMENT Right 06/23/2017   Procedure: CYSTOSCOPY/URETEROSCOPY/HOLMIUM LASER/STENT PLACEMENT;  Surgeon: Barba Glendia JAYSON, MD;  Location: [facility];  Service: Urology;  Laterality: Right;   CYSTOSCOPY/URETEROSCOPY/HOLMIUM LASER/STENT PLACEMENT Right 05/07/2021   Procedure: CYSTOSCOPY/URETEROSCOPY/HOLMIUM LASER/STENT PLACEMENT;  Surgeon: Barba Glendia JAYSON, MD;  Location: [facility];  Service: Urology;  Laterality: Right;   ESOPHAGOGASTRODUODENOSCOPY (EGD) WITH PROPOFOL  N/A 01/03/2015   Procedure: ESOPHAGOGASTRODUODENOSCOPY (EGD) WITH PROPOFOL ;  Surgeon: Louanne KANDICE Muse, MD;  Location: [facility];  Service: Endoscopy;  Laterality: N/A;   EYE SURGERY     bilateral cataract    FOOT SURGERY     KIDNEY STONE SURGERY     NASAL SINUS SURGERY     PORT-A-CATH REMOVAL     PORTACATH PLACEMENT  10/24/13   THYROID  SURGERY     TONSILLECTOMY      Home Medications:  Allergies as of 04/03/2023       Reactions   Codeine Nausea And Vomiting   Oxycodone  Nausea And Vomiting   Penicillins Nausea And Vomiting   Has patient had a PCN reaction causing immediate rash, facial/tongue/throat swelling, SOB or lightheadedness with hypotension: No Has patient had a PCN reaction causing severe rash involving mucus membranes  or skin necrosis: No Has patient had a PCN reaction that required hospitalization: No Has patient had a PCN reaction occurring within the last 10 years: No If all of the above answers are NO, then may proceed with Cephalosporin use.   Tramadol  Nausea And Vomiting   Trintellix  [vortioxetine ] Itching        Medication List        Accurate as of April 03, 2023  9:41 AM. If you have any questions, ask your nurse or doctor.           aspirin -acetaminophen -caffeine  250-250-65 MG tablet Commonly known as: EXCEDRIN  MIGRAINE Take 2 tablets by mouth 2 (two) times daily as needed for headache or migraine.   baclofen  10 MG tablet Commonly known as: LIORESAL  Take 1 tablet (10 mg total) by mouth 3 (three) times daily as needed (for back pain).   hydrochlorothiazide  25 MG tablet Commonly known as: HYDRODIURIL  Take 1 tablet (25 mg total) by mouth daily.   levothyroxine  112 MCG tablet Commonly known as: SYNTHROID  TAKE 1 TABLET BY MOUTH ONCE DAILY BEFORE BREAKFAST. APPOINTMENT NEEDED FOR FURTHER REFILLS.   meloxicam  15 MG tablet Commonly known as: MOBIC  Take 1 tablet (15 mg total) by mouth daily.   metoprolol  succinate 100 MG 24 hr tablet Commonly known as: TOPROL -XL TAKE 1 TABLET BY MOUTH ONCE DAILY WITH OR IMMEDIATELY FOLLOWING A MEAL   mirtazapine  45 MG tablet Commonly known as: REMERON  Take 1 tablet (45 mg total) by mouth at bedtime.   ondansetron  4 MG disintegrating tablet Commonly known as: ZOFRAN -ODT Take 1 tablet (4 mg total) by mouth every 8 (eight) hours as needed for nausea or vomiting.   rosuvastatin  20 MG tablet Commonly known as: Crestor  Take 1 tablet (20 mg total) by mouth daily.        Allergies:  Allergies  Allergen Reactions   Codeine Nausea And Vomiting   Oxycodone  Nausea And Vomiting   Penicillins Nausea And Vomiting    Has patient had a PCN reaction causing immediate rash, facial/tongue/throat swelling, SOB or lightheadedness with hypotension: No Has patient had a PCN reaction causing severe rash involving mucus membranes or skin necrosis: No Has patient had a PCN reaction that required hospitalization: No Has patient had a PCN reaction occurring within the last 10 years: No If all of the above answers are NO, then may proceed with Cephalosporin use.    Tramadol  Nausea And Vomiting   Trintellix  [Vortioxetine ] Itching    Family History: Family History  Problem Relation Age of  Onset   Heart disease Mother    Diabetes Mother    Arthritis Sister    Hyperlipidemia Sister    COPD Brother    Diabetes Sister    Colon cancer Other        first cousin   Breast cancer Maternal Aunt    Breast cancer Paternal Aunt     Social History:  reports that she has been smoking cigarettes. She has been exposed to tobacco smoke. She has never used smokeless tobacco. She reports current alcohol  use. She reports that she does not use drugs.   Physical Exam: BP (!) 142/80   Pulse 74   Ht 5' 8 (1.727 m)   Wt 166 lb (75.3 kg)   BMI 25.24 kg/m   Constitutional:  Alert and oriented, No acute distress. HEENT: [place] AT Respiratory: Normal respiratory effort, no increased work of breathing. Psychiatric: Normal mood and affect.  Urinalysis Pending   Pertinent Imaging: KUB performed earlier today  was personally reviewed and interpreted. Moderate amount of stool and bowel gas overlying renal outlines. Calcifications overlying the left renal outline. Her previous CT March 2024 was personally reviewed and interpreted.   CT RENAL STONE STUDY  Narrative CLINICAL DATA:  Abdominal/flank pain.  Evaluate for kidney stone.  EXAM: CT ABDOMEN AND PELVIS WITHOUT CONTRAST  TECHNIQUE: Multidetector CT imaging of the abdomen and pelvis was performed following the standard protocol without IV contrast.  RADIATION DOSE REDUCTION: This exam was performed according to the departmental dose-optimization program which includes automated exposure control, adjustment of the mA and/or kV according to patient size and/or use of iterative reconstruction technique.  COMPARISON:  09/23/2021  FINDINGS: Lower chest: No acute abnormality.  Hepatobiliary: No focal liver abnormality is seen. Status post cholecystectomy. No biliary dilatation.  Pancreas: Unremarkable. No pancreatic ductal dilatation or surrounding inflammatory changes.  Spleen: Normal in size without focal  abnormality.  Adrenals/Urinary Tract: Normal adrenal glands. Multiple left kidney cysts identified. The largest arises off the posterior cortex of the interpolar left kidney measuring 6.5 cm. No follow-up imaging recommended. Multiple left renal calculi. The largest stone is in the upper pole collecting system measuring 7 mm, image 18/2 several punctate stones identified within the upper and lower pole of the left kidney. The largest is in the inferior pole measuring 2 mm. No hydronephrosis identified bilaterally. No hydroureter or ureteral calculi. Bladder is unremarkable.  Stomach/Bowel: Stomach appears within normal limits. Status post right hemicolectomy with enterocolonic anastomosis. No bowel wall thickening, inflammation, or distension.  Vascular/Lymphatic: Aortic atherosclerosis. No aneurysm. No abdominopelvic adenopathy.  Reproductive: Status post hysterectomy. No adnexal masses.  Other: No ascites or fluid collections. No signs of pneumoperitoneum.  Musculoskeletal: No acute or significant osseous findings. L3-4 and L4-5 degenerative disc disease.  IMPRESSION: 1. No acute findings within the abdomen or pelvis. 2. Bilateral nonobstructing renal calculi. 3. Status post right hemicolectomy with enterocolonic anastomosis. 4.  Aortic Atherosclerosis (ICD10-I70.0).   Electronically Signed By: Waddell Calk M.D. On: 06/17/2022 14:36   Assessment & Plan:    1. Bilateral nephrolithiasis Non-obstructing renal calculi.  Continue annual follow-up with KUB and instructed to call earlier for development of flank pain/renal colic.  I have reviewed the above documentation for accuracy and completeness, and I agree with the above.   [name], MD  [facility] [address] [place], [place] [address] [phone]

## 2023-04-06 ENCOUNTER — Telehealth: Payer: Self-pay | Admitting: Urology

## 2023-04-06 NOTE — Telephone Encounter (Signed)
 Read last note to patient. It states follow up next year .

## 2023-04-06 NOTE — Telephone Encounter (Signed)
 She was here on Friday but forgot to ask what the next steps are going to be to get her kidney stones removed? They are not hurting and she does drink a lot of water. She does not have MyChart. Would like a call back at [phone].

## 2023-04-17 ENCOUNTER — Encounter: Payer: Self-pay | Admitting: Family Medicine

## 2023-04-17 ENCOUNTER — Ambulatory Visit (INDEPENDENT_AMBULATORY_CARE_PROVIDER_SITE_OTHER): Payer: 59 | Admitting: Family Medicine

## 2023-04-17 VITALS — BP 143/84 | HR 52 | Ht 68.0 in | Wt 169.0 lb

## 2023-04-17 DIAGNOSIS — E890 Postprocedural hypothyroidism: Secondary | ICD-10-CM | POA: Diagnosis not present

## 2023-04-17 DIAGNOSIS — G43909 Migraine, unspecified, not intractable, without status migrainosus: Secondary | ICD-10-CM | POA: Insufficient documentation

## 2023-04-17 DIAGNOSIS — D509 Iron deficiency anemia, unspecified: Secondary | ICD-10-CM

## 2023-04-17 DIAGNOSIS — E785 Hyperlipidemia, unspecified: Secondary | ICD-10-CM

## 2023-04-17 DIAGNOSIS — R739 Hyperglycemia, unspecified: Secondary | ICD-10-CM

## 2023-04-17 DIAGNOSIS — I10 Essential (primary) hypertension: Secondary | ICD-10-CM | POA: Diagnosis not present

## 2023-04-17 DIAGNOSIS — F419 Anxiety disorder, unspecified: Secondary | ICD-10-CM

## 2023-04-17 DIAGNOSIS — F32A Depression, unspecified: Secondary | ICD-10-CM

## 2023-04-17 MED ORDER — METOPROLOL SUCCINATE ER 100 MG PO TB24: ORAL_TABLET | ORAL | 1 refills | Status: DC

## 2023-04-17 MED ORDER — LEVOTHYROXINE SODIUM 112 MCG PO TABS: ORAL_TABLET | ORAL | 1 refills | Status: DC

## 2023-04-17 MED ORDER — HYDROXYZINE PAMOATE 25 MG PO CAPS: 25.0000 mg | ORAL_CAPSULE | Freq: Every evening | ORAL | 2 refills | Status: DC

## 2023-04-17 MED ORDER — HYDROCHLOROTHIAZIDE 25 MG PO TABS: 25.0000 mg | ORAL_TABLET | Freq: Every day | ORAL | 1 refills | Status: AC

## 2023-04-17 MED ORDER — ROSUVASTATIN CALCIUM 20 MG PO TABS: 20.0000 mg | ORAL_TABLET | Freq: Every day | ORAL | 3 refills | Status: AC

## 2023-04-17 MED ORDER — FLUOXETINE HCL 20 MG PO CAPS: 20.0000 mg | ORAL_CAPSULE | Freq: Every day | ORAL | 0 refills | Status: DC

## 2023-04-17 NOTE — Assessment & Plan Note (Signed)
Hx of iron deficiency anemia with chronic decreased hgb Will recheck today

## 2023-04-17 NOTE — Assessment & Plan Note (Signed)
Will repeat tsh/t4 manages with levothyroxine 112 mcg

## 2023-04-17 NOTE — Assessment & Plan Note (Addendum)
Chronic, pt stopped mirtazapine due side effects of itching Interested in trying different medication Will trial prozac 20 mg daily As need hydroxyzine 25 mg as needed for anxiety  F/u in 4 weeks

## 2023-04-17 NOTE — Assessment & Plan Note (Signed)
Reports daily headaches and occasional migraines - appears to be increasing. May be related to BP, but given age, migraine hx, and frequency - would appreciate further w/u Previous evaluation by neurology but several years ago. -Referral to neurology for evaluation of daily headaches and migraines. -Continue as needed Excedrin for migraine abortifacient.

## 2023-04-17 NOTE — Assessment & Plan Note (Addendum)
Chronic Crestor 20mg  daily - continue Will check fasting LP Decrease saturated fats, tobacco use Increase veggies, fruits, nuts, fish May take daily omega 3 supplement

## 2023-04-17 NOTE — Progress Notes (Signed)
Established Patient Office Visit  Introduced to nurse practitioner role and practice setting.  All questions answered.  Discussed provider/patient relationship and expectations.   Subjective   Patient ID: Melissa Fitzgerald, female    DOB: 1945-11-01  Age: 78 y.o. MRN: [id number]  No chief complaint on file.   The patient, a 78 year old individual with a history of hyperlipidemia, hypertension, anxiety, and migraines, presents for medication refills and management of anxiety. The patient, a 78 year old individual with a history of hyperlipidemia, hypertension, anxiety, and migraines, presents for medication refills and management of anxiety. The patient reports taking her blood pressure medication regularly, but notes occasional elevated readings at home, but states usually SBP <130, and DBP low 80s - also acknowledging a recent home care visit and provider stated her BP was controlled.  The patient also reports a long-standing issue with anxiety, which was previously well-managed with mirtazapine at night per pt. However, she recently discontinued the medication due to a new onset of generalized itching, which she attributes to the medication - so she stopped medication several months ago. The patient expresses a desire to manage her anxiety and is open to trying new medications. She also mentions a history of insomnia- which she managed with mirtazapine - but given stated side effects has stopped.  Migraines and daily headaches. She notes that her headaches have been increasing in frequency over the past few months. The patient has a history of seeing a neurologist for her headaches, but it has been a several years.  Thyroidectomy  -  approximately eight to nine years ago. She reports that the surgery initially led to a period of depression,The patient also notes a history of hoarseness, which she attributes to her thyroid condition. She reports being out of daily levothyroxine.       04/17/2023   11:28 AM 07/14/2022   10:26 AM 06/26/2022    1:15 PM  Depression screen PHQ 2/9  Decreased Interest 2 1 1   Down, Depressed, Hopeless 3 1 1   PHQ - 2 Score 5 2 2    Altered sleeping 3 1 1   Tired, decreased energy 1 1 1   Change in appetite 1 1 1   Feeling bad or failure about yourself  0 1 1  Trouble concentrating 0 0 0  Moving slowly or fidgety/restless 1 0 1  Suicidal thoughts 0 0 1  PHQ-9 Score 11 6 8   Difficult doing work/chores Not difficult at all Very difficult Very difficult       04/17/2023   11:29 AM 05/29/2022    2:56 PM  GAD 7 : Generalized Anxiety Score  Nervous, Anxious, on Edge 1 3  Control/stop worrying 2 3  Worry too much - different things 2 3  Trouble relaxing 2 1  Restless 1 0  Easily annoyed or irritable 1 2  Afraid - awful might happen 2 1  Total GAD 7 Score 11 13  Anxiety Difficulty Somewhat difficult Not difficult at all   Review of Systems  All other systems reviewed and are negative.  Negative unless indicated in HPI   Objective:     BP (!) 143/84 (BP Location: Left Arm, Patient Position: Sitting, Cuff Size: Normal)   Pulse (!) 52   Ht 5\' 8"  (1.727 m)   Wt 169 lb (76.7 kg)   SpO2 97%   BMI 25.70 kg/m    Physical Exam Constitutional:      General: She is not in acute distress.    Appearance: Normal appearance. She is normal weight. She is not ill-appearing, toxic-appearing or diaphoretic.  HENT:     Head: Normocephalic.  Right Ear: Tympanic membrane normal.     Left Ear: Tympanic membrane normal.     Nose: Nose normal.     Mouth/Throat:     Mouth: Mucous membranes are moist.     Pharynx: Oropharynx is clear. No oropharyngeal exudate or posterior oropharyngeal erythema.  Eyes:     General: Lids are normal.     Extraocular Movements: Extraocular movements intact.     Right eye: Normal extraocular motion.     Left eye: Normal extraocular motion.     Conjunctiva/sclera: Conjunctivae normal.     Right eye: Right conjunctiva is not injected.     Left eye: Left conjunctiva is not injected.     Pupils: Pupils are equal, round, and reactive to light.  Neck:     Thyroid: No thyroid mass, thyromegaly  or thyroid tenderness.  Cardiovascular:     Rate and Rhythm: Normal rate and regular rhythm.     Pulses: Normal pulses.          Radial pulses are 2+ on the right side and 2+ on the left side.       Posterior tibial pulses are 2+ on the right side and 2+ on the left side.     Heart sounds: Normal heart sounds, S1 normal and S2 normal. No murmur heard.    No friction rub. No gallop.  Pulmonary:     Effort: Pulmonary effort is normal. No respiratory distress.     Breath sounds: Normal breath sounds. No stridor. No wheezing, rhonchi or rales.  Chest:     Chest wall: No tenderness.  Abdominal:     General: Bowel sounds are normal. There is no distension.     Palpations: Abdomen is soft. There is no mass.     Tenderness: There is no abdominal tenderness. There is no guarding or rebound.     Hernia: No hernia is present.  Musculoskeletal:        General: No swelling or tenderness. Normal range of motion.     Cervical back: Normal range of motion. No rigidity.     Right lower leg: No edema.     Left lower leg: No edema.  Lymphadenopathy:     Cervical: No cervical adenopathy.     Right cervical: No superficial, deep or posterior cervical adenopathy.    Left cervical: No superficial, deep or posterior cervical adenopathy.  Skin:    General: Skin is warm and dry.     Capillary Refill: Capillary refill takes less than 2 seconds.     Findings: No bruising or erythema.  Neurological:     General: No focal deficit present.     Mental Status: She is alert and oriented to person, place, and time. Mental status is at baseline.     GCS: GCS eye subscore is 4. GCS verbal subscore is 5. GCS motor subscore is 6.     Cranial Nerves: No cranial nerve deficit.     Sensory: No sensory deficit.     Motor: No weakness, tremor or pronator drift.     Coordination: Romberg sign negative.     Gait: Gait is intact. Gait normal.  Psychiatric:        Attention and Perception: Attention and perception normal.         Mood and Affect: Mood is anxious. Affect is flat.        Speech: Speech is tangential.        Behavior: Behavior normal. Behavior is cooperative.  Thought Content: Thought content normal.        Cognition and Memory: Cognition and memory normal.        Judgment: Judgment normal.    No results found for any visits on 04/17/23.    The 10-year ASCVD risk score (Arnett DK, et al., 2019) is: 44.1%    Assessment & Plan:  Migraine without status migrainosus, not intractable, unspecified migraine type Assessment & Plan: Reports daily headaches and occasional migraines - appears to be increasing. May be related to BP, but given age, migraine hx, and frequency - would appreciate further w/u Previous evaluation by neurology but several years ago. -Referral to neurology for evaluation of daily headaches and migraines. -Continue as needed Excedrin for migraine abortifacient.   Orders: -     Ambulatory referral to Neurology  Essential (primary) hypertension Assessment & Plan: Elevated blood pressure readings in the clinic and at home. Currently on Metoprolol 100 mg and Hydrochlorothiazide 25 mg. Discussed the importance of consistent medication adherence and regular blood pressure monitoring. -Continue current antihypertensive medications. -Monitor blood pressure at home and record readings. -Follow-up in 4 weeks for blood pressure check.  Orders: -     hydroCHLOROthiazide; Take 1 tablet (25 mg total) by mouth daily.  Dispense: 90 tablet; Refill: 1 -     Metoprolol Succinate ER; TAKE 1 TABLET BY MOUTH ONCE DAILY WITH  OR  IMMEDIATELY  FOLLOWING  A  MEAL  Dispense: 90 tablet; Refill: 1 -     Comprehensive metabolic panel  Hypothyroidism, postop Assessment & Plan: Will repeat tsh/t4 manages with levothyroxine 112 mcg    Orders: -     Levothyroxine Sodium; TAKE 1 TABLET BY MOUTH ONCE DAILY BEFORE BREAKFAST. APPOINTMENT NEEDED FOR FURTHER REFILLS.  Dispense: 90 tablet; Refill:  1 -     TSH  Hyperlipidemia, unspecified hyperlipidemia type Assessment & Plan: Chronic Crestor 20mg  daily - continue Will check fasting LP Decrease saturated fats, tobacco use Increase veggies, fruits, nuts, fish May take daily omega 3 supplement  Orders: -     Rosuvastatin Calcium; Take 1 tablet (20 mg total) by mouth daily.  Dispense: 90 tablet; Refill: 3 -     Lipid panel  Hyperglycemia Assessment & Plan: Hx of elevated glucose Will check A1c and CMP Encourage exercise, small frequent meals, increase proteins, decrease starches  Orders: -     Hemoglobin A1c  Anxiety and depression Assessment & Plan: Chronic, pt stopped mirtazapine due side effects of itching Interested in trying different medication Will trial prozac 20 mg daily As need hydroxyzine 25 mg as needed for anxiety  F/u in 4 weeks  Orders: -     FLUoxetine HCl; Take 1 capsule (20 mg total) by mouth daily.  Dispense: 90 capsule; Refill: 0 -     hydrOXYzine Pamoate; Take 1 capsule (25 mg total) by mouth at bedtime.  Dispense: 30 capsule; Refill: 2  Iron deficiency anemia, unspecified iron deficiency anemia type Assessment & Plan: Hx of iron deficiency anemia with chronic decreased hgb Will recheck today  Orders: -     CBC with Differential/Platelet -     Iron, TIBC and Ferritin Panel   Pt unable to get labs today - placed - pt plans to get them drawn next week (04/20/23)  Return in about 4 weeks (around 05/15/2023) for mood and BP.   I, Sallee Provencal, FNP, have reviewed all documentation for this visit. The documentation on 04/17/23 for the exam, diagnosis, procedures, and orders are all  accurate and complete.   Sallee Provencal, FNP

## 2023-04-17 NOTE — Assessment & Plan Note (Signed)
Hx of elevated glucose Will check A1c and CMP Encourage exercise, small frequent meals, increase proteins, decrease starches

## 2023-04-17 NOTE — Assessment & Plan Note (Signed)
Elevated blood pressure readings in the clinic and at home. Currently on Metoprolol 100 mg and Hydrochlorothiazide 25 mg. Discussed the importance of consistent medication adherence and regular blood pressure monitoring. -Continue current antihypertensive medications. -Monitor blood pressure at home and record readings. -Follow-up in 4 weeks for blood pressure check.

## 2023-04-20 ENCOUNTER — Other Ambulatory Visit: Payer: Self-pay | Admitting: Family Medicine

## 2023-04-20 DIAGNOSIS — G43909 Migraine, unspecified, not intractable, without status migrainosus: Secondary | ICD-10-CM

## 2023-04-22 ENCOUNTER — Telehealth: Payer: Self-pay | Admitting: Family Medicine

## 2023-04-22 NOTE — Telephone Encounter (Signed)
Patient called to ask the office if there was a freeze on here insurance due to the government and if it would effect here upcoming appointment. I advised her to call and check with her insurance directly. Patient also asked about [facility] visit and I advised she call them about their scheduling because we are two separate offices

## 2023-04-30 DIAGNOSIS — E785 Hyperlipidemia, unspecified: Secondary | ICD-10-CM | POA: Diagnosis not present

## 2023-04-30 DIAGNOSIS — D509 Iron deficiency anemia, unspecified: Secondary | ICD-10-CM | POA: Diagnosis not present

## 2023-04-30 DIAGNOSIS — I10 Essential (primary) hypertension: Secondary | ICD-10-CM | POA: Diagnosis not present

## 2023-04-30 DIAGNOSIS — E890 Postprocedural hypothyroidism: Secondary | ICD-10-CM | POA: Diagnosis not present

## 2023-04-30 DIAGNOSIS — R739 Hyperglycemia, unspecified: Secondary | ICD-10-CM | POA: Diagnosis not present

## 2023-05-01 ENCOUNTER — Other Ambulatory Visit: Payer: Self-pay | Admitting: Family Medicine

## 2023-05-01 DIAGNOSIS — E890 Postprocedural hypothyroidism: Secondary | ICD-10-CM

## 2023-05-01 DIAGNOSIS — N1831 Chronic kidney disease, stage 3a: Secondary | ICD-10-CM

## 2023-05-01 LAB — COMPREHENSIVE METABOLIC PANEL
ALT: 61 [IU]/L — ABNORMAL HIGH (ref 0–32)
AST: 79 [IU]/L — ABNORMAL HIGH (ref 0–40)
Albumin: 4.7 g/dL (ref 3.8–4.8)
Alkaline Phosphatase: 82 [IU]/L (ref 44–121)
BUN/Creatinine Ratio: 16 (ref 12–28)
BUN: 20 mg/dL (ref 8–27)
Bilirubin Total: 0.8 mg/dL (ref 0.0–1.2)
CO2: 28 mmol/L (ref 20–29)
Calcium: 9.8 mg/dL (ref 8.7–10.3)
Chloride: 97 mmol/L (ref 96–106)
Creatinine, Ser: 1.22 mg/dL — ABNORMAL HIGH (ref 0.57–1.00)
Globulin, Total: 3.6 g/dL (ref 1.5–4.5)
Glucose: 90 mg/dL (ref 70–99)
Potassium: 3.4 mmol/L — ABNORMAL LOW (ref 3.5–5.2)
Sodium: 143 mmol/L (ref 134–144)
Total Protein: 8.3 g/dL (ref 6.0–8.5)
eGFR: 46 mL/min/{1.73_m2} — ABNORMAL LOW (ref >59)

## 2023-05-01 LAB — CBC WITH DIFFERENTIAL/PLATELET
Basophils Absolute: 0 10*3/uL (ref 0.0–0.2)
Basos: 1 %
EOS (ABSOLUTE): 0 10*3/uL (ref 0.0–0.4)
Eos: 1 %
Hematocrit: 36.2 % (ref 34.0–46.6)
Hemoglobin: 11.5 g/dL (ref 11.1–15.9)
Immature Grans (Abs): 0 10*3/uL (ref 0.0–0.1)
Immature Granulocytes: 0 %
Lymphocytes Absolute: 0.9 10*3/uL (ref 0.7–3.1)
Lymphs: 22 %
MCH: 30.4 pg (ref 26.6–33.0)
MCHC: 31.8 g/dL (ref 31.5–35.7)
MCV: 96 fL (ref 79–97)
Monocytes Absolute: 0.3 10*3/uL (ref 0.1–0.9)
Monocytes: 7 %
Neutrophils Absolute: 3 10*3/uL (ref 1.4–7.0)
Neutrophils: 69 %
Platelets: 241 10*3/uL (ref 150–450)
RBC: 3.78 x10E6/uL (ref 3.77–5.28)
RDW: 13.4 % (ref 11.7–15.4)
WBC: 4.3 10*3/uL (ref 3.4–10.8)

## 2023-05-01 LAB — TSH: TSH: 31.4 u[IU]/mL — ABNORMAL HIGH (ref 0.450–4.500)

## 2023-05-01 LAB — LIPID PANEL
Chol/HDL Ratio: 2.5 {ratio} (ref 0.0–4.4)
Cholesterol, Total: 228 mg/dL — ABNORMAL HIGH (ref 100–199)
HDL: 91 mg/dL (ref >39)
LDL Chol Calc (NIH): 122 mg/dL — ABNORMAL HIGH (ref 0–99)
Triglycerides: 87 mg/dL (ref 0–149)
VLDL Cholesterol Cal: 15 mg/dL (ref 5–40)

## 2023-05-01 LAB — HEMOGLOBIN A1C
Est. average glucose Bld gHb Est-mCnc: 114 mg/dL
Hgb A1c MFr Bld: 5.6 % (ref 4.8–5.6)

## 2023-05-01 MED ORDER — LEVOTHYROXINE SODIUM 125 MCG PO TABS: 125.0000 ug | ORAL_TABLET | Freq: Every day | ORAL | 3 refills | Status: AC

## 2023-05-04 ENCOUNTER — Telehealth: Payer: Self-pay

## 2023-05-04 NOTE — Telephone Encounter (Signed)
 Patient aware of results and recommendations.  She is following kidney doctor at this time.

## 2023-05-04 NOTE — Telephone Encounter (Signed)
-----   Message from Tasia Farr sent at 05/01/2023  4:35 PM EST ----- Please call pt with results.   A1c - normal values- no prediabetes or diabetes CBC - normal blood count Lipid - improved from previous, but continues to have elevated cholesterol (goal<200) and LDL(goal<100) - continue crestor  20mg  daily. CMP - elevated creatinine and decreased GFR, chronics potassium that is subtly low normal.  Elevated AST and ALT. Recommend increase in water intake daily, decrease saturated fats and processed foods. Would like pt to recheck CMP in [redacted] weeks along with TSH. TSH - elevated - recommend increasing daily levothyroxine  to 125mcg daily - will place order and plan to check lab in 6 weeks - will place order.  The 10-year ASCVD risk score (Arnett DK, et al., 2019) is: 46.5%   Values used to calculate the score:     Age: 78 years     Sex: Female     Is Non-Hispanic African American: Yes     Diabetic: No     Tobacco smoker: Yes     Systolic Blood Pressure: 143 mmHg     Is BP treated: Yes     HDL Cholesterol: 91 mg/dL     Total Cholesterol: 228 mg/dL

## 2023-05-15 ENCOUNTER — Ambulatory Visit: Payer: Self-pay | Admitting: Family Medicine

## 2023-05-20 ENCOUNTER — Encounter: Payer: Self-pay | Admitting: Family Medicine

## 2023-05-20 ENCOUNTER — Ambulatory Visit (INDEPENDENT_AMBULATORY_CARE_PROVIDER_SITE_OTHER): Payer: 59 | Admitting: Family Medicine

## 2023-05-20 VITALS — BP 100/75 | HR 65 | Ht 66.0 in | Wt 160.6 lb

## 2023-05-20 DIAGNOSIS — E890 Postprocedural hypothyroidism: Secondary | ICD-10-CM | POA: Diagnosis not present

## 2023-05-20 DIAGNOSIS — F32A Depression, unspecified: Secondary | ICD-10-CM | POA: Diagnosis not present

## 2023-05-20 DIAGNOSIS — G43909 Migraine, unspecified, not intractable, without status migrainosus: Secondary | ICD-10-CM

## 2023-05-20 DIAGNOSIS — F419 Anxiety disorder, unspecified: Secondary | ICD-10-CM

## 2023-05-20 DIAGNOSIS — H9313 Tinnitus, bilateral: Secondary | ICD-10-CM | POA: Diagnosis not present

## 2023-05-20 DIAGNOSIS — K649 Unspecified hemorrhoids: Secondary | ICD-10-CM | POA: Diagnosis not present

## 2023-05-20 DIAGNOSIS — H43393 Other vitreous opacities, bilateral: Secondary | ICD-10-CM | POA: Insufficient documentation

## 2023-05-20 DIAGNOSIS — G4700 Insomnia, unspecified: Secondary | ICD-10-CM

## 2023-05-20 DIAGNOSIS — I10 Essential (primary) hypertension: Secondary | ICD-10-CM | POA: Diagnosis not present

## 2023-05-20 MED ORDER — HYDROCORTISONE (PERIANAL) 2.5 % EX CREA: 1.0000 | TOPICAL_CREAM | Freq: Two times a day (BID) | CUTANEOUS | 1 refills | Status: DC

## 2023-05-20 NOTE — Assessment & Plan Note (Signed)
 Pt has concerns for floaters bilaterally PERRLA on exam, EOMs intact Has not seen optho in a while - would like referral

## 2023-05-20 NOTE — Assessment & Plan Note (Signed)
 Pt states migraines improved from previous visit, which pt was having increased weekly headaches Has neuro appt on 06/23/23 for further eval given age, chronic, and intermittent uncontrolled BP Continue to f/u with neuro BP mgmt continue Continue use of as needed Excedrin for HA

## 2023-05-20 NOTE — Assessment & Plan Note (Signed)
 Reports a long history of constipation leading to development of hemorrhoid, x1.  No current pain or changes in size. No current constipation issues Decline physical assessment of hemorrhoid during visit States not painful, no bleeding, normal Bms now, but one hemorrhoid present, she would like a cream for it -Prescribed Anusol BID for hemorrhoid relief. -Recommend high fiber diet and as need stool softener to prevent worsening of hemorrhoids.

## 2023-05-20 NOTE — Assessment & Plan Note (Signed)
 Recently increased Levothyroxine to due to abnormal thyroid levels. -Continue Levothyroxine daily. -Check thyroid levels in 4-6 weeks post increasing dose

## 2023-05-20 NOTE — Assessment & Plan Note (Signed)
 Reports ringing and echoing in ears. L TM mild ear effusion, clear, non bulging - may take OTC flonase to improve eustachian tube flow -Refer to audiologist for hearing evaluation.

## 2023-05-20 NOTE — Assessment & Plan Note (Signed)
 Intermittent insomnia issues May use OTC melatonin of Unisom Sleep hygiene and schedule discussed

## 2023-05-20 NOTE — Assessment & Plan Note (Signed)
 On Fluoxetine 20mg  daily for 4 weeks.  Reports some improvement but not to the level of mirtazapine which pt stopped due to itching. -Continue Fluoxetine 20mg  daily. -Consider increasing to 40mg  daily in 2 weeks if no further improvement. Continue as needed hydroxyzine F/u 4 weeks for mood check

## 2023-05-20 NOTE — Assessment & Plan Note (Signed)
 Chronic, stable today in office Continue Metoprolol 100 mg and Hydrochlorothiazide 25 mg.  -consistent medication use and regular blood pressure monitoring at home recommended. - GOAL<130/80 Low sodium diet

## 2023-05-20 NOTE — Progress Notes (Signed)
 Established Patient Office Visit  Introduced to nurse practitioner role and practice setting.  All questions answered.  Discussed provider/patient relationship and expectations.   Subjective   Patient ID: Melissa Fitzgerald, female    DOB: [birth year]  Age: 78 y.o. MRN: [id number]  Chief Complaint  Patient presents with   Follow-up   Melissa Fitzgerald is a 78 year old female with mood disorder and hypothyroidism who presents for a mood follow-up and medication adjustment.  She is currently taking fluoxetine 20 mg daily for anxiety. While the medication has not exacerbated her symptoms, it has not provided significant relief either. Previous medication was mirtazapine but pt stopped due to itching and somnolence.  Post thyroidectomy hypothyroidism - levothyroxine 125 mcg, recently adjusted from 112 mcg due to abnormal thyroid levels.   Hemorrhoid - has one d/t to hx of constipation. Denies constipation today. Denies painful bowel movements, Denies bleeding. States not bothersome, but interested in cream.  Migraines - since childhood , waxes and wanes with severity - neuro on 06/23/23  She experiences an echo/tinnitus, which she attributes to aging- would like to see audiologist  She mentions floaters in her vision and is seeking an eye doctor referral.        05/20/2023    1:29 PM 04/17/2023   11:28 AM 07/14/2022   10:26 AM  Depression screen PHQ 2/9  Decreased Interest 2 2 1   Down, Depressed, Hopeless 2 3 1   PHQ - 2 Score 4 5 2   Altered sleeping 3 3 1   Tired, decreased energy 1 1 1   Change in appetite 1 1 1   Feeling bad or failure about yourself  0 0 1  Trouble concentrating 1 0 0  Moving slowly or fidgety/restless 0 1 0  Suicidal thoughts 0 0 0  PHQ-9 Score 10 11 6   Difficult doing work/chores Somewhat difficult Not difficult at all Very difficult       05/20/2023    1:32 PM 04/17/2023   11:29 AM 05/29/2022    2:56 PM  GAD 7 : Generalized Anxiety Score  Nervous, Anxious, on  Edge 1 1 3   Control/stop worrying 3 2 3   Worry too much - different things 3 2 3   Trouble relaxing 1 2 1   Restless 1 1 0  Easily annoyed or irritable 1 1 2   Afraid - awful might happen 1 2 1   Total GAD 7 Score 11 11 13   Anxiety Difficulty Somewhat difficult Somewhat difficult Not difficult at all   Review of Systems  All other systems reviewed and are negative.   Negative unless indicated in HPI   Objective:     BP 100/75   Pulse 65   Ht 5\' 6"  (1.676 m)   Wt 160 lb 9.6 oz (72.8 kg)   SpO2 100%   BMI 25.92 kg/m    Physical Exam Vitals reviewed.  Constitutional:      General: She is not in acute distress.    Appearance: Normal appearance. She is obese. She is not toxic-appearing or diaphoretic.  HENT:     Head: Normocephalic.     Right Ear: Ear canal and external ear normal. No middle ear effusion.     Left Ear: Ear canal and external ear normal. A middle ear effusion is present.     Ears:     Comments: Clear, mid ear effusion L Tm, non bulging, no erythema    Nose: Nose normal.     Mouth/Throat:     Mouth:  Mucous membranes are moist.     Pharynx: Oropharynx is clear.  Eyes:     Extraocular Movements: Extraocular movements intact.     Pupils: Pupils are equal, round, and reactive to light.  Cardiovascular:     Rate and Rhythm: Normal rate and regular rhythm.     Pulses: Normal pulses.     Heart sounds: Normal heart sounds. No murmur heard.    No friction rub. No gallop.  Pulmonary:     Effort: No respiratory distress.     Breath sounds: No stridor. No wheezing, rhonchi or rales.  Chest:     Chest wall: No tenderness.  Musculoskeletal:     Right lower leg: No edema.     Left lower leg: No edema.  Skin:    General: Skin is warm and dry.     Capillary Refill: Capillary refill takes less than 2 seconds.  Neurological:     General: No focal deficit present.     Mental Status: She is alert and oriented to person, place, and time. Mental status is at baseline.      Cranial Nerves: No cranial nerve deficit.     Sensory: No sensory deficit.     Motor: No weakness.  Psychiatric:        Attention and Perception: She is inattentive.        Mood and Affect: Mood normal.        Speech: Speech is tangential.        Behavior: Behavior normal.        Thought Content: Thought content normal.        Cognition and Memory: Cognition normal.        Judgment: Judgment normal.      No results found for any visits on 05/20/23.    The 10-year ASCVD risk score (Arnett DK, et al., 2019) is: 32%    Assessment & Plan:  Anxiety and depression Assessment & Plan: On Fluoxetine 20mg  daily for 4 weeks.  Reports some improvement but not to the level of mirtazapine which pt stopped due to itching. -Continue Fluoxetine 20mg  daily. -Consider increasing to 40mg  daily in 2 weeks if no further improvement. Continue as needed hydroxyzine F/u 4 weeks for mood check   Hemorrhoids, unspecified hemorrhoid type Assessment & Plan: Reports a long history of constipation leading to development of hemorrhoid, x1.  No current pain or changes in size. No current constipation issues Decline physical assessment of hemorrhoid during visit States not painful, no bleeding, normal Bms now, but one hemorrhoid present, she would like a cream for it -Prescribed Anusol BID for hemorrhoid relief. -Recommend high fiber diet and as need stool softener to prevent worsening of hemorrhoids.  Orders: -     Hydrocortisone (Perianal); Place 1 Application rectally 2 (two) times daily.  Dispense: 30 g; Refill: 1  Floaters in visual field, bilateral Assessment & Plan: Pt has concerns for floaters bilaterally PERRLA on exam, EOMs intact Has not seen optho in a while - would like referral   Tinnitus of both ears Assessment & Plan: Reports ringing and echoing in ears. L TM mild ear effusion, clear, non bulging - may take OTC flonase to improve eustachian tube flow -Refer to audiologist  for hearing evaluation.   Essential (primary) hypertension Assessment & Plan: Chronic, stable today in office Continue Metoprolol 100 mg and Hydrochlorothiazide 25 mg.  -consistent medication use and regular blood pressure monitoring at home recommended. - GOAL<130/80 Low sodium diet  Migraine without status migrainosus, not intractable, unspecified migraine type Assessment & Plan: Pt states migraines improved from previous visit, which pt was having increased weekly headaches Has neuro appt on 06/23/23 for further eval given age, chronic, and intermittent uncontrolled BP Continue to f/u with neuro BP mgmt continue Continue use of as needed Excedrin for HA    Hypothyroidism, postop Assessment & Plan: Recently increased Levothyroxine to due to abnormal thyroid levels. -Continue Levothyroxine daily. -Check thyroid levels in 4-6 weeks post increasing dose   Insomnia, unspecified type Assessment & Plan: Intermittent insomnia issues May use OTC melatonin of [employer] Sleep hygiene and schedule discussed     Return in about 4 weeks (around 06/17/2023) for Needs TSH checked and mood update.   I, Sallee Provencal, FNP, have reviewed all documentation for this visit. The documentation on 05/20/23 for the exam, diagnosis, procedures, and orders are all accurate and complete.   Sallee Provencal, FNP

## 2023-06-18 ENCOUNTER — Ambulatory Visit: Payer: 59 | Admitting: Family Medicine

## 2023-07-22 DIAGNOSIS — H43813 Vitreous degeneration, bilateral: Secondary | ICD-10-CM | POA: Diagnosis not present

## 2023-07-22 DIAGNOSIS — Z961 Presence of intraocular lens: Secondary | ICD-10-CM | POA: Diagnosis not present

## 2023-11-03 DIAGNOSIS — H43393 Other vitreous opacities, bilateral: Secondary | ICD-10-CM | POA: Diagnosis not present

## 2023-11-03 DIAGNOSIS — H43813 Vitreous degeneration, bilateral: Secondary | ICD-10-CM | POA: Diagnosis not present

## 2023-11-03 DIAGNOSIS — Z961 Presence of intraocular lens: Secondary | ICD-10-CM | POA: Diagnosis not present

## 2023-11-26 DIAGNOSIS — H9311 Tinnitus, right ear: Secondary | ICD-10-CM | POA: Diagnosis not present

## 2023-11-26 DIAGNOSIS — H903 Sensorineural hearing loss, bilateral: Secondary | ICD-10-CM | POA: Diagnosis not present

## 2023-11-26 DIAGNOSIS — H6123 Impacted cerumen, bilateral: Secondary | ICD-10-CM | POA: Diagnosis not present

## 2023-11-26 DIAGNOSIS — J301 Allergic rhinitis due to pollen: Secondary | ICD-10-CM | POA: Diagnosis not present

## 2023-11-26 DIAGNOSIS — H6983 Other specified disorders of Eustachian tube, bilateral: Secondary | ICD-10-CM | POA: Diagnosis not present

## 2023-11-29 NOTE — Progress Notes (Signed)
 12/01/2023 11:51 AM   Melissa Fitzgerald 1945-06-06 [id number]  Referring provider: Wellington Curtis DELENA, FNP [address] [place],  [place] [address]  Urological history: 1.  Nephrolithiasis -Stone composition unknown - right URS (2019) - right URS (2023) -CT renal stone study (09/2021) bilateral renal calculi -KUB (05/2022) stable left nephrolithiasis - KUB  (03/2023)  stale left nephrolithiasis  Chief Complaint  Patient presents with   Nephrolithiasis   HPI: Melissa Fitzgerald is a 78 y.o. woman who presents today for very yellow urine.    Previous records reviewed.  She states that her urine has been really yellow recently and she has been having to strain to urinate.  This has been going on for 1 month.  She finds it concerning because she has a history of kidney stones and she drinks a lot of water.  She has baseline urge incontinence.  Patient denies any modifying or aggravating factors.  Patient denies any recent UTI's, gross hematuria, dysuria or suprapubic/flank pain.  Patient denies any fevers, chills, nausea or vomiting.    She denied any vaginal bulge or stress incontinence.  She does admit to an occasional vaginal burning and itching which she contributes to her kidney stones.  UA is yellow cloudy, specific gravity 1.025, trace ketone, 2+ leukocyte, 11-30 WBCs, greater than 10 epithelial cells and moderate bacteria.  She would also like another x-ray to assess her current stone burden.  PMH: Past Medical History:  Diagnosis Date   Anemia    Arthritis    Cancer (HCC)    Colon polyp    Depression    Headache    History of 2019 novel coronavirus disease (COVID-19) 04/09/2021   History of kidney stones    Hyperlipidemia    Hypertension    Hypothyroidism    Irritable bowel disease    Kidney stone    Pneumonia    Thyroid  disease     Surgical History: Past Surgical History:  Procedure Laterality Date   ABDOMINAL HYSTERECTOMY     BREAST  ENHANCEMENT SURGERY     BREAST SURGERY     CHOLECYSTECTOMY  2000   COLECTOMY  09/2013   COLONOSCOPY  09-13-13   Dr Luellen   COLONOSCOPY WITH PROPOFOL  N/A 10/18/2014   Procedure: COLONOSCOPY WITH PROPOFOL ;  Surgeon: Louanne KANDICE Muse, MD;  Location: [facility];  Service: Endoscopy;  Laterality: N/A;   CYSTOSCOPY WITH URETEROSCOPY AND STENT PLACEMENT Right 04/09/2021   Procedure: CYSTOSCOPY WITH URETEROSCOPY AND STENT PLACEMENT;  Surgeon: Twylla Glendia BROCKS, MD;  Location: [facility];  Service: Urology;  Laterality: Right;   CYSTOSCOPY/URETEROSCOPY/HOLMIUM LASER/STENT PLACEMENT Right 06/23/2017   Procedure: CYSTOSCOPY/URETEROSCOPY/HOLMIUM LASER/STENT PLACEMENT;  Surgeon: Twylla Glendia BROCKS, MD;  Location: [facility];  Service: Urology;  Laterality: Right;   CYSTOSCOPY/URETEROSCOPY/HOLMIUM LASER/STENT PLACEMENT Right 05/07/2021   Procedure: CYSTOSCOPY/URETEROSCOPY/HOLMIUM LASER/STENT PLACEMENT;  Surgeon: Twylla Glendia BROCKS, MD;  Location: [facility];  Service: Urology;  Laterality: Right;   ESOPHAGOGASTRODUODENOSCOPY (EGD) WITH PROPOFOL  N/A 01/03/2015   Procedure: ESOPHAGOGASTRODUODENOSCOPY (EGD) WITH PROPOFOL ;  Surgeon: Louanne KANDICE Muse, MD;  Location: [facility];  Service: Endoscopy;  Laterality: N/A;   EYE SURGERY     bilateral cataract    FOOT SURGERY     KIDNEY STONE SURGERY     NASAL SINUS SURGERY     PORT-A-CATH REMOVAL     PORTACATH PLACEMENT  10/24/13   THYROID  SURGERY     TONSILLECTOMY      Home Medications:  Allergies as of 12/01/2023  Reactions   Codeine Nausea And Vomiting   Oxycodone  Nausea And Vomiting   Penicillins Nausea And Vomiting   Has patient had a PCN reaction causing immediate rash, facial/tongue/throat swelling, SOB or lightheadedness with hypotension: No Has patient had a PCN reaction causing severe rash involving mucus membranes or skin necrosis: No Has patient had a PCN reaction that required hospitalization: No Has patient had a PCN reaction occurring  within the last 10 years: No If all of the above answers are NO, then may proceed with Cephalosporin use.   Tramadol  Nausea And Vomiting   Trintellix  [vortioxetine ] Itching        Medication List        Accurate as of December 01, 2023 11:51 AM. If you have any questions, ask your nurse or doctor.          STOP taking these medications    baclofen  10 MG tablet Commonly known as: LIORESAL  Stopped by: Jinelle Butchko   FLUoxetine  20 MG capsule Commonly known as: PROZAC  Stopped by: Ludger Bones   hydrocortisone  2.5 % rectal cream Commonly known as: Anusol -HC Stopped by: Mahi Zabriskie   hydrOXYzine  25 MG capsule Commonly known as: VISTARIL  Stopped by: Deserae Jennings   meloxicam  15 MG tablet Commonly known as: MOBIC  Stopped by: Devera Englander       TAKE these medications    aspirin -acetaminophen -caffeine  250-250-65 MG tablet Commonly known as: EXCEDRIN  MIGRAINE Take 2 tablets by mouth 2 (two) times daily as needed for headache or migraine.   estradiol  0.1 MG/GM vaginal cream Commonly known as: ESTRACE  VAGINAL Apply 0.5mg  (pea-sized amount)  just inside the vaginal introitus with a finger-tip on Monday, Wednesday and Friday nights. Started by: Melissa Fitzgerald   hydrochlorothiazide  25 MG tablet Commonly known as: HYDRODIURIL  Take 1 tablet (25 mg total) by mouth daily.   levothyroxine  125 MCG tablet Commonly known as: SYNTHROID  Take 1 tablet (125 mcg total) by mouth daily.   metoprolol  succinate 100 MG 24 hr tablet Commonly known as: TOPROL -XL TAKE 1 TABLET BY MOUTH ONCE DAILY WITH  OR  IMMEDIATELY  FOLLOWING  A  MEAL   ondansetron  4 MG disintegrating tablet Commonly known as: ZOFRAN -ODT Take 1 tablet (4 mg total) by mouth every 8 (eight) hours as needed for nausea or vomiting.   predniSONE  5 MG (21) Tbpk tablet Commonly known as: STERAPRED UNI-PAK 21 TAB Take by mouth as directed.   rosuvastatin  20 MG tablet Commonly known as: Crestor  Take  1 tablet (20 mg total) by mouth daily.   Ryaltris 334-74 MCG/ACT Susp Generic drug: Olopatadine-Mometasone Place into both nostrils.        Allergies:  Allergies  Allergen Reactions   Codeine Nausea And Vomiting   Oxycodone  Nausea And Vomiting   Penicillins Nausea And Vomiting    Has patient had a PCN reaction causing immediate rash, facial/tongue/throat swelling, SOB or lightheadedness with hypotension: No Has patient had a PCN reaction causing severe rash involving mucus membranes or skin necrosis: No Has patient had a PCN reaction that required hospitalization: No Has patient had a PCN reaction occurring within the last 10 years: No If all of the above answers are NO, then may proceed with Cephalosporin use.    Tramadol  Nausea And Vomiting   Trintellix  [Vortioxetine ] Itching    Family History: Family History  Problem Relation Age of Onset   Heart disease Mother    Diabetes Mother    Arthritis Sister    Hyperlipidemia Sister    COPD Brother  Diabetes Sister    Colon cancer Other        first cousin   Breast cancer Maternal Aunt    Breast cancer Paternal Aunt     Social History:  reports that she has been smoking cigarettes. She has been exposed to tobacco smoke. She has never used smokeless tobacco. She reports current alcohol  use. She reports that she does not use drugs.  ROS: Pertinent ROS in HPI  Physical Exam: BP 129/80 (BP Location: Left Arm, Patient Position: Sitting, Cuff Size: Normal)   Pulse 67   Ht 5' 6 (1.676 m)   Wt 170 lb (77.1 kg)   BMI 27.44 kg/m   Constitutional:  Well nourished. Alert and oriented, No acute distress. HEENT: [place] AT, moist mucus membranes.  Trachea midline Cardiovascular: No clubbing, cyanosis, or edema. Respiratory: Normal respiratory effort, no increased work of breathing. GU: No CVA tenderness.  No bladder fullness or masses.  Recession of labia minora, dry, pale vulvar vaginal mucosa and loss of mucosal ridges and  folds.  Normal urethral meatus, no lesions, no prolapse, no discharge.   No urethral masses, tenderness and/or tenderness. No bladder fullness, tenderness or masses. Pale vagina mucosa, poor estrogen effect, no discharge, no lesions, poor pelvic support, Grade II cystocele and no rectocele noted.  Anus and perineum are without rashes or lesions.    Neurologic: Grossly intact, no focal deficits, moving all 4 extremities. Psychiatric: Normal mood and affect.    Laboratory Data: Lab Results  Component Value Date   WBC 4.3 04/30/2023   HGB 11.5 04/30/2023   HCT 36.2 04/30/2023   MCV 96 04/30/2023   PLT 241 04/30/2023   Lab Results  Component Value Date   CREATININE 1.22 (H) 04/30/2023    Lab Results  Component Value Date   HGBA1C 5.6 04/30/2023    Urinalysis Component     Latest Ref Rng 12/01/2023  Glucose, UA     Negative  Negative   Specific Gravity, UA     1.005 - 1.030  1.025   pH, UA     5.0 - 7.5  6.0   Color, UA     Yellow  Yellow   Appearance Ur     Clear  Slightly cloudy   Leukocytes,UA     Negative  2+ !   Protein,UA     Negative/Trace  Negative   Ketones, UA     Negative  Trace !   RBC, UA     Negative  Negative   Bilirubin, UA     Negative  Negative   Urobilinogen, Ur     0.2 - 1.0 mg/dL 0.2   Nitrite, UA     Negative  Negative   Microscopic Examination See below:     Legend: ! Abnormal  Component     Latest Ref Rng 12/01/2023  WBC, UA     0 - 5 /hpf 11-30 !   Bacteria, UA     None seen/Few  Moderate !   RBC, Urine     0 - 2 /hpf 0-2   Epithelial Cells (non renal)     0 - 10 /hpf >10 !     Legend: ! Abnormal I have reviewed the labs.  See HPI.    Pertinent Imaging: Narrative & Impression  EXAM: 1 VIEW XRAY OF THE ABDOMEN 12/01/2023 12:13:00 PM   COMPARISON: 04/03/2023   CLINICAL HISTORY: Flank pain. Follow up for flank pain, kidney stones, nephrolithiasis.   FINDINGS:  BOWEL: Nonobstructive bowel gas pattern. Bowel suture  staples in right lower quadrant.   SOFT TISSUES: Punctate 2 mm calculus projects over mid left kidney. Pelvic phleboliths. Status post cholecystectomy.   BONES: Multilevel degenerative changes. No acute osseous abnormality.   IMPRESSION: 1. Punctate 2 mm calculus projects over mid left kidney. 2. Status post cholecystectomy.   Electronically signed by: Dayne Hassell MD 12/08/2023 08:04 PM EDT RP Workstation: [id number]  I have independently reviewed the films.  See HPI.    Assessment & Plan:    1.  Abnormal urine - UA with WBCs and bacteria, but many squames, and since she is asymptomatic, this is likely a contaminated specimen and I will not be sending it for culture - Explained that many things can contribute to the color of your urine, such as hydration status, medications, foods, supplements/vitamins, etc. and in the setting of a negative urinalysis, it is not concerning  2. Cystocele/vaginal atrophy - Explained the natural history of GSM and advised her that her symptoms of vaginal itching and burning are secondary to GSM and not to kidney stones - Explained that her having to strain to urinate is likely due to the cystocele and advised her to lean forward to completely empty her bladder - I also encouraged her to start vaginal estrogen cream, to apply Monday, Wednesday and Friday evenings and I explained this will be a lifelong medication - Prescription sent for Estrace  cream to her pharmacy - I also advised her to start doing Kegel exercises to help strengthen the pelvic muscles and I handout is given on those exercises along with a video  3.  Nephrolithiasis - Patient requests an x-ray today to assess current stone burden - I placed the orders for the KUB and we will call her with the results   Return for keep follow up with Dr. Twylla .  These notes generated with voice recognition software. I apologize for typographical errors.  Melissa Fitzgerald  [facility] [address]  [address] [place], [place] [address] [phone]

## 2023-12-01 ENCOUNTER — Ambulatory Visit
Admission: RE | Admit: 2023-12-01 | Discharge: 2023-12-01 | Disposition: A | Discharge status: Home / Self Care | Source: Ambulatory Visit | Attending: Urology | Admitting: Urology

## 2023-12-01 ENCOUNTER — Encounter: Payer: Self-pay | Admitting: Urology

## 2023-12-01 ENCOUNTER — Telehealth: Payer: Self-pay | Admitting: Urology

## 2023-12-01 ENCOUNTER — Ambulatory Visit (INDEPENDENT_AMBULATORY_CARE_PROVIDER_SITE_OTHER): Admitting: Urology

## 2023-12-01 VITALS — BP 129/80 | HR 67 | Ht 66.0 in | Wt 170.0 lb

## 2023-12-01 DIAGNOSIS — N952 Postmenopausal atrophic vaginitis: Secondary | ICD-10-CM | POA: Diagnosis not present

## 2023-12-01 DIAGNOSIS — R8290 Unspecified abnormal findings in urine: Secondary | ICD-10-CM

## 2023-12-01 DIAGNOSIS — N200 Calculus of kidney: Secondary | ICD-10-CM | POA: Diagnosis not present

## 2023-12-01 DIAGNOSIS — N8111 Cystocele, midline: Secondary | ICD-10-CM | POA: Diagnosis not present

## 2023-12-01 DIAGNOSIS — I878 Other specified disorders of veins: Secondary | ICD-10-CM | POA: Diagnosis not present

## 2023-12-01 DIAGNOSIS — Z9049 Acquired absence of other specified parts of digestive tract: Secondary | ICD-10-CM | POA: Diagnosis not present

## 2023-12-01 DIAGNOSIS — R109 Unspecified abdominal pain: Secondary | ICD-10-CM | POA: Diagnosis not present

## 2023-12-01 LAB — URINALYSIS, COMPLETE
Bilirubin, UA: NEGATIVE
Glucose, UA: NEGATIVE
Nitrite, UA: NEGATIVE
Protein,UA: NEGATIVE
RBC, UA: NEGATIVE
Specific Gravity, UA: 1.025 (ref 1.005–1.030)
Urobilinogen, Ur: 0.2 mg/dL (ref 0.2–1.0)
pH, UA: 6 (ref 5.0–7.5)

## 2023-12-01 LAB — MICROSCOPIC EXAMINATION: Epithelial Cells (non renal): >10 /HPF — AB (ref 0–10)

## 2023-12-01 MED ORDER — ESTRADIOL 0.1 MG/GM VA CREA: TOPICAL_CREAM | VAGINAL | 12 refills | Status: AC

## 2023-12-01 NOTE — Telephone Encounter (Signed)
 Would you let Melissa Fitzgerald know that her Xray shows stable left sided stones?  They are small and in the kidney right now, so we do not need to treat them at this time.

## 2023-12-01 NOTE — Telephone Encounter (Signed)
 Called patient and patient didn't answer left a message to call back

## 2023-12-07 ENCOUNTER — Telehealth: Payer: Self-pay

## 2023-12-07 NOTE — Telephone Encounter (Signed)
 Pt returned our call and I let her know her results from [facility] and she voiced understanding.

## 2024-01-04 ENCOUNTER — Other Ambulatory Visit: Payer: Self-pay | Admitting: Family Medicine

## 2024-01-04 DIAGNOSIS — I10 Essential (primary) hypertension: Secondary | ICD-10-CM

## 2024-01-04 NOTE — Telephone Encounter (Unsigned)
 Copied from CRM [id number]. Topic: Clinical - Medication Refill >> Jan 04, 2024  1:56 PM Joesph B wrote: Medication:   metoprolol  succinate (TOPROL -XL) 100 MG 24 hr tablet  Has the patient contacted their pharmacy? Yes (Agent: If no, request that the patient contact the pharmacy for the refill. If patient does not wish to contact the pharmacy document the reason why and proceed with request.) (Agent: If yes, when and what did the pharmacy advise?)  This is the patient's preferred pharmacy:  [facility] [address]), [place] - [address] [place]) [place] [address] Phone: [phone] Fax: [phone]  Is this the correct pharmacy for this prescription? Yes If no, delete pharmacy and type the correct one.   Has the prescription been filled recently? Yes  Is the patient out of the medication? Yes  Has the patient been seen for an appointment in the last year OR does the patient have an upcoming appointment? Yes  Can we respond through MyChart? No  Agent: Please be advised that Rx refills may take up to 3 business days. We ask that you follow-up with your pharmacy.

## 2024-01-06 MED ORDER — METOPROLOL SUCCINATE ER 100 MG PO TB24: ORAL_TABLET | ORAL | 0 refills | Status: AC

## 2024-01-06 NOTE — Telephone Encounter (Signed)
 OFFICE VISIT NEEDED FOR ADDITIONAL REFILLS.  Requested Prescriptions  Pending Prescriptions Disp Refills   metoprolol  succinate (TOPROL -XL) 100 MG 24 hr tablet 90 tablet 0    Sig: TAKE 1 TABLET BY MOUTH ONCE DAILY WITH  OR  IMMEDIATELY  FOLLOWING  A  MEAL     Cardiovascular:  Beta Blockers Failed - 01/06/2024  2:58 PM      Failed - Valid encounter within last 6 months    Recent Outpatient Visits           7 months ago Anxiety and depression   [facility] [place], Melissa LABOR, FNP       Future Appointments             In 2 months Stoioff, Melissa BROCKS, MD [facility] [place]            Passed - Last BP in normal range    BP Readings from Last 1 Encounters:  12/01/23 129/80         Passed - Last Heart Rate in normal range    Pulse Readings from Last 1 Encounters:  12/01/23 67

## 2024-01-20 ENCOUNTER — Other Ambulatory Visit: Payer: Self-pay | Admitting: Otolaryngology

## 2024-01-20 DIAGNOSIS — H90A21 Sensorineural hearing loss, unilateral, right ear, with restricted hearing on the contralateral side: Secondary | ICD-10-CM | POA: Diagnosis not present

## 2024-01-20 DIAGNOSIS — H90A31 Mixed conductive and sensorineural hearing loss, unilateral, right ear with restricted hearing on the contralateral side: Secondary | ICD-10-CM

## 2024-02-02 ENCOUNTER — Ambulatory Visit
Admission: RE | Admit: 2024-02-02 | Discharge: 2024-02-02 | Disposition: A | Source: Ambulatory Visit | Attending: Otolaryngology | Admitting: Otolaryngology

## 2024-02-02 DIAGNOSIS — H90A31 Mixed conductive and sensorineural hearing loss, unilateral, right ear with restricted hearing on the contralateral side: Secondary | ICD-10-CM

## 2024-02-02 MED ORDER — GADOPICLENOL 0.5 MMOL/ML IV SOLN
7.5000 mL | Freq: Once | INTRAVENOUS | Status: AC | PRN
  Administered 2024-02-02: 7.5 mL via INTRAVENOUS

## 2024-03-15 ENCOUNTER — Other Ambulatory Visit: Payer: Self-pay | Admitting: *Deleted

## 2024-03-15 DIAGNOSIS — N200 Calculus of kidney: Secondary | ICD-10-CM

## 2024-04-01 ENCOUNTER — Ambulatory Visit: Payer: Self-pay | Admitting: Urology

## 2024-04-12 ENCOUNTER — Ambulatory Visit: Admitting: Urology

## 2024-06-08 ENCOUNTER — Ambulatory Visit: Admitting: Urology
